# Patient Record
Sex: Female | Born: 1940 | Race: White | Hispanic: No | Marital: Single | Smoking: Never smoker
Health system: Southern US, Community
[De-identification: ages and names within clinical notes are randomized; demographics above are authoritative.]

## PROBLEM LIST (undated history)

## (undated) DIAGNOSIS — R011 Cardiac murmur, unspecified: Secondary | ICD-10-CM

## (undated) DIAGNOSIS — K589 Irritable bowel syndrome without diarrhea: Secondary | ICD-10-CM

## (undated) DIAGNOSIS — G4730 Sleep apnea, unspecified: Secondary | ICD-10-CM

## (undated) DIAGNOSIS — T884XXA Failed or difficult intubation, initial encounter: Secondary | ICD-10-CM

## (undated) DIAGNOSIS — T8859XA Other complications of anesthesia, initial encounter: Secondary | ICD-10-CM

## (undated) DIAGNOSIS — K559 Vascular disorder of intestine, unspecified: Secondary | ICD-10-CM

## (undated) DIAGNOSIS — R519 Headache, unspecified: Secondary | ICD-10-CM

## (undated) DIAGNOSIS — K648 Other hemorrhoids: Secondary | ICD-10-CM

## (undated) DIAGNOSIS — J45909 Unspecified asthma, uncomplicated: Secondary | ICD-10-CM

## (undated) DIAGNOSIS — T4145XA Adverse effect of unspecified anesthetic, initial encounter: Secondary | ICD-10-CM

## (undated) DIAGNOSIS — K55039 Acute (reversible) ischemia of large intestine, extent unspecified: Secondary | ICD-10-CM

## (undated) DIAGNOSIS — R32 Unspecified urinary incontinence: Secondary | ICD-10-CM

## (undated) DIAGNOSIS — K219 Gastro-esophageal reflux disease without esophagitis: Secondary | ICD-10-CM

## (undated) DIAGNOSIS — D62 Acute posthemorrhagic anemia: Secondary | ICD-10-CM

## (undated) DIAGNOSIS — E781 Pure hyperglyceridemia: Secondary | ICD-10-CM

## (undated) DIAGNOSIS — K529 Noninfective gastroenteritis and colitis, unspecified: Secondary | ICD-10-CM

## (undated) DIAGNOSIS — M797 Fibromyalgia: Secondary | ICD-10-CM

## (undated) DIAGNOSIS — R51 Headache: Secondary | ICD-10-CM

## (undated) DIAGNOSIS — R159 Full incontinence of feces: Secondary | ICD-10-CM

## (undated) DIAGNOSIS — M79673 Pain in unspecified foot: Secondary | ICD-10-CM

## (undated) DIAGNOSIS — G8929 Other chronic pain: Secondary | ICD-10-CM

## (undated) DIAGNOSIS — M1990 Unspecified osteoarthritis, unspecified site: Secondary | ICD-10-CM

## (undated) DIAGNOSIS — E785 Hyperlipidemia, unspecified: Secondary | ICD-10-CM

## (undated) DIAGNOSIS — E049 Nontoxic goiter, unspecified: Secondary | ICD-10-CM

## (undated) HISTORY — PX: CATARACT EXTRACTION: SUR2

## (undated) HISTORY — PX: THYROID SURGERY: SHX805

## (undated) HISTORY — PX: TUBAL LIGATION: SHX77

## (undated) HISTORY — PX: MYOMECTOMY: SHX85

## (undated) HISTORY — PX: EYE SURGERY: SHX253

## (undated) HISTORY — PX: SACRAL NERVE STIMULATOR PLACEMENT: SHX2367

## (undated) HISTORY — DX: Acute posthemorrhagic anemia: D62

## (undated) HISTORY — PX: TONSILLECTOMY: SUR1361

## (undated) HISTORY — DX: Irritable bowel syndrome, unspecified: K58.9

## (undated) HISTORY — PX: TOOTH EXTRACTION: SUR596

## (undated) HISTORY — DX: Acute (reversible) ischemia of large intestine, extent unspecified: K55.039

---

## 1978-10-12 HISTORY — PX: OTHER SURGICAL HISTORY: SHX169

## 2005-07-13 ENCOUNTER — Emergency Department: Payer: Self-pay | Admitting: General Practice

## 2005-08-20 ENCOUNTER — Ambulatory Visit: Payer: Self-pay | Admitting: Internal Medicine

## 2005-12-30 ENCOUNTER — Ambulatory Visit: Payer: Self-pay | Admitting: Ophthalmology

## 2007-06-16 ENCOUNTER — Ambulatory Visit: Payer: Self-pay | Admitting: Family Medicine

## 2008-01-02 ENCOUNTER — Ambulatory Visit: Payer: Self-pay | Admitting: General Surgery

## 2008-07-24 ENCOUNTER — Emergency Department: Payer: Self-pay | Admitting: Unknown Physician Specialty

## 2010-03-31 ENCOUNTER — Ambulatory Visit: Payer: Self-pay | Admitting: Unknown Physician Specialty

## 2010-05-02 ENCOUNTER — Ambulatory Visit: Payer: Self-pay

## 2010-05-08 ENCOUNTER — Ambulatory Visit: Payer: Self-pay

## 2010-10-21 ENCOUNTER — Ambulatory Visit: Payer: Self-pay

## 2011-06-23 ENCOUNTER — Ambulatory Visit: Payer: Self-pay

## 2011-10-02 ENCOUNTER — Ambulatory Visit: Payer: Self-pay | Admitting: Family Medicine

## 2011-10-19 ENCOUNTER — Encounter: Payer: Self-pay | Admitting: Urology

## 2011-11-13 ENCOUNTER — Encounter: Payer: Self-pay | Admitting: Urology

## 2012-02-10 DIAGNOSIS — K559 Vascular disorder of intestine, unspecified: Secondary | ICD-10-CM

## 2012-02-10 DIAGNOSIS — E781 Pure hyperglyceridemia: Secondary | ICD-10-CM

## 2012-02-10 HISTORY — DX: Pure hyperglyceridemia: E78.1

## 2012-02-10 HISTORY — DX: Vascular disorder of intestine, unspecified: K55.9

## 2012-02-26 ENCOUNTER — Encounter (HOSPITAL_COMMUNITY): Payer: Self-pay | Admitting: *Deleted

## 2012-02-26 ENCOUNTER — Emergency Department (HOSPITAL_COMMUNITY): Payer: PRIVATE HEALTH INSURANCE

## 2012-02-26 ENCOUNTER — Inpatient Hospital Stay (HOSPITAL_COMMUNITY)
Admission: EM | Admit: 2012-02-26 | Discharge: 2012-02-29 | DRG: 395 | Disposition: A | Discharge status: Home / Self Care | Payer: PRIVATE HEALTH INSURANCE | Attending: Internal Medicine | Admitting: Internal Medicine

## 2012-02-26 DIAGNOSIS — E079 Disorder of thyroid, unspecified: Secondary | ICD-10-CM | POA: Diagnosis present

## 2012-02-26 DIAGNOSIS — K55059 Acute (reversible) ischemia of intestine, part and extent unspecified: Principal | ICD-10-CM

## 2012-02-26 DIAGNOSIS — K589 Irritable bowel syndrome without diarrhea: Secondary | ICD-10-CM | POA: Diagnosis present

## 2012-02-26 DIAGNOSIS — K2940 Chronic atrophic gastritis without bleeding: Secondary | ICD-10-CM | POA: Diagnosis present

## 2012-02-26 DIAGNOSIS — M19079 Primary osteoarthritis, unspecified ankle and foot: Secondary | ICD-10-CM | POA: Diagnosis present

## 2012-02-26 DIAGNOSIS — E039 Hypothyroidism, unspecified: Secondary | ICD-10-CM | POA: Diagnosis present

## 2012-02-26 DIAGNOSIS — M19049 Primary osteoarthritis, unspecified hand: Secondary | ICD-10-CM | POA: Diagnosis present

## 2012-02-26 DIAGNOSIS — E785 Hyperlipidemia, unspecified: Secondary | ICD-10-CM | POA: Diagnosis present

## 2012-02-26 DIAGNOSIS — K5792 Diverticulitis of intestine, part unspecified, without perforation or abscess without bleeding: Secondary | ICD-10-CM

## 2012-02-26 DIAGNOSIS — R1084 Generalized abdominal pain: Secondary | ICD-10-CM | POA: Diagnosis present

## 2012-02-26 DIAGNOSIS — K922 Gastrointestinal hemorrhage, unspecified: Secondary | ICD-10-CM

## 2012-02-26 DIAGNOSIS — K5732 Diverticulitis of large intestine without perforation or abscess without bleeding: Secondary | ICD-10-CM

## 2012-02-26 DIAGNOSIS — K55039 Acute (reversible) ischemia of large intestine, extent unspecified: Secondary | ICD-10-CM | POA: Diagnosis present

## 2012-02-26 DIAGNOSIS — R109 Unspecified abdominal pain: Secondary | ICD-10-CM

## 2012-02-26 HISTORY — DX: Other hemorrhoids: K64.8

## 2012-02-26 HISTORY — DX: Unspecified urinary incontinence: R32

## 2012-02-26 HISTORY — DX: Full incontinence of feces: R15.9

## 2012-02-26 HISTORY — DX: Other chronic pain: M79.673

## 2012-02-26 HISTORY — DX: Nontoxic goiter, unspecified: E04.9

## 2012-02-26 HISTORY — DX: Unspecified osteoarthritis, unspecified site: M19.90

## 2012-02-26 HISTORY — DX: Other chronic pain: G89.29

## 2012-02-26 HISTORY — DX: Gastro-esophageal reflux disease without esophagitis: K21.9

## 2012-02-26 HISTORY — DX: Pure hyperglyceridemia: E78.1

## 2012-02-26 HISTORY — DX: Vascular disorder of intestine, unspecified: K55.9

## 2012-02-26 HISTORY — DX: Hyperlipidemia, unspecified: E78.5

## 2012-02-26 HISTORY — DX: Acute (reversible) ischemia of large intestine, extent unspecified: K55.039

## 2012-02-26 HISTORY — DX: Noninfective gastroenteritis and colitis, unspecified: K52.9

## 2012-02-26 LAB — DIFFERENTIAL
Basophils Absolute: 0 10*3/uL (ref 0.0–0.1)
Basophils Relative: 0 % (ref 0–1)
Eosinophils Absolute: 0 10*3/uL (ref 0.0–0.7)
Eosinophils Relative: 0 % (ref 0–5)
Monocytes Absolute: 0.6 10*3/uL (ref 0.1–1.0)

## 2012-02-26 LAB — URINALYSIS, MICROSCOPIC ONLY
Bilirubin Urine: NEGATIVE
Ketones, ur: NEGATIVE mg/dL
Protein, ur: NEGATIVE mg/dL
Urobilinogen, UA: 0.2 mg/dL (ref 0.0–1.0)

## 2012-02-26 LAB — CBC
HCT: 37.5 % (ref 36.0–46.0)
MCH: 30.8 pg (ref 26.0–34.0)
MCHC: 34.1 g/dL (ref 30.0–36.0)
MCV: 90.4 fL (ref 78.0–100.0)
MCV: 91.8 fL (ref 78.0–100.0)
Platelets: 186 10*3/uL (ref 150–400)
RDW: 13.3 % (ref 11.5–15.5)
RDW: 13.5 % (ref 11.5–15.5)
WBC: 9.3 10*3/uL (ref 4.0–10.5)

## 2012-02-26 LAB — LACTIC ACID, PLASMA: Lactic Acid, Venous: 0.9 mmol/L (ref 0.5–2.2)

## 2012-02-26 LAB — BASIC METABOLIC PANEL
Calcium: 9.2 mg/dL (ref 8.4–10.5)
Creatinine, Ser: 0.59 mg/dL (ref 0.50–1.10)
GFR calc Af Amer: >90 mL/min (ref >90)

## 2012-02-26 LAB — T4, FREE: Free T4: 0.95 ng/dL (ref 0.80–1.80)

## 2012-02-26 LAB — OCCULT BLOOD, POC DEVICE: Fecal Occult Bld: POSITIVE

## 2012-02-26 MED ORDER — METRONIDAZOLE IN NACL 5-0.79 MG/ML-% IV SOLN
500.0000 mg | Freq: Once | INTRAVENOUS | Status: AC
  Administered 2012-02-26: 500 mg via INTRAVENOUS
  Filled 2012-02-26: qty 100

## 2012-02-26 MED ORDER — MORPHINE SULFATE 2 MG/ML IJ SOLN
2.0000 mg | Freq: Four times a day (QID) | INTRAMUSCULAR | Status: DC | PRN
  Administered 2012-02-26: 2 mg via INTRAVENOUS
  Filled 2012-02-26: qty 1

## 2012-02-26 MED ORDER — METOCLOPRAMIDE HCL 5 MG/ML IJ SOLN
10.0000 mg | Freq: Once | INTRAMUSCULAR | Status: AC
  Administered 2012-02-26: 10 mg via INTRAVENOUS
  Filled 2012-02-26: qty 2

## 2012-02-26 MED ORDER — ONDANSETRON HCL 4 MG/2ML IJ SOLN
4.0000 mg | Freq: Once | INTRAMUSCULAR | Status: AC
  Administered 2012-02-26: 4 mg via INTRAVENOUS
  Filled 2012-02-26: qty 2

## 2012-02-26 MED ORDER — HYDROMORPHONE HCL PF 1 MG/ML IJ SOLN
1.0000 mg | INTRAMUSCULAR | Status: DC | PRN
  Administered 2012-02-26 – 2012-02-28 (×5): 1 mg via INTRAVENOUS
  Filled 2012-02-26 (×5): qty 1

## 2012-02-26 MED ORDER — PEG 3350-KCL-NA BICARB-NACL 420 G PO SOLR
4000.0000 mL | Freq: Once | ORAL | Status: AC
  Administered 2012-02-26: 4000 mL via ORAL
  Filled 2012-02-26: qty 4000

## 2012-02-26 MED ORDER — CIPROFLOXACIN IN D5W 400 MG/200ML IV SOLN
400.0000 mg | Freq: Once | INTRAVENOUS | Status: AC
  Administered 2012-02-26: 400 mg via INTRAVENOUS
  Filled 2012-02-26: qty 200

## 2012-02-26 MED ORDER — MORPHINE SULFATE 4 MG/ML IJ SOLN
4.0000 mg | Freq: Once | INTRAMUSCULAR | Status: AC
  Administered 2012-02-26: 4 mg via INTRAVENOUS
  Filled 2012-02-26: qty 1

## 2012-02-26 MED ORDER — SODIUM CHLORIDE 0.9 % IV SOLN
INTRAVENOUS | Status: DC
  Administered 2012-02-26 (×3): via INTRAVENOUS
  Administered 2012-02-26: 125 mL via INTRAVENOUS
  Administered 2012-02-27 (×2): via INTRAVENOUS

## 2012-02-26 MED ORDER — PANTOPRAZOLE SODIUM 40 MG IV SOLR
40.0000 mg | INTRAVENOUS | Status: DC
  Administered 2012-02-26: 40 mg via INTRAVENOUS
  Filled 2012-02-26: qty 40

## 2012-02-26 MED ORDER — HYDROMORPHONE HCL PF 1 MG/ML IJ SOLN
1.0000 mg | Freq: Once | INTRAMUSCULAR | Status: AC
  Administered 2012-02-26: 1 mg via INTRAVENOUS
  Filled 2012-02-26: qty 1

## 2012-02-26 MED ORDER — SODIUM CHLORIDE 0.9 % IJ SOLN
3.0000 mL | Freq: Two times a day (BID) | INTRAMUSCULAR | Status: DC
  Administered 2012-02-27 – 2012-02-28 (×3): 3 mL via INTRAVENOUS

## 2012-02-26 MED ORDER — IOHEXOL 300 MG/ML  SOLN
100.0000 mL | Freq: Once | INTRAMUSCULAR | Status: AC | PRN
  Administered 2012-02-26: 100 mL via INTRAVENOUS

## 2012-02-26 NOTE — ED Notes (Signed)
Patient transported to CT 
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
## 2012-02-26 NOTE — ED Notes (Signed)
Pt given contrast to drink for CT Scan.

## 2012-02-26 NOTE — ED Notes (Signed)
MD at bedside. 

## 2012-02-26 NOTE — ED Provider Notes (Signed)
History     CSN: [id number]  Arrival date & time 02/26/12  1023   First MD Initiated Contact with Patient 02/26/12 1031      Chief Complaint  Patient presents with  . Abdominal Pain  . Rectal Bleeding    (Consider location/radiation/quality/duration/timing/severity/associated sxs/prior treatment) Patient is a 71 y.o. female presenting with abdominal pain and hematochezia. The history is provided by the patient.  Abdominal Pain The primary symptoms of the illness include abdominal pain, nausea, diarrhea and hematochezia. The primary symptoms of the illness do not include fever, shortness of breath or vomiting.  Additional symptoms associated with the illness include chills and diaphoresis.  Rectal Bleeding  Associated symptoms include abdominal pain, diarrhea and nausea. Pertinent negatives include no fever, no vomiting and no headaches.   the patient is a 71 year old, female, who presents emergency department complaining of left-sided abdominal pain, with bloody stool.  She also felt lightheaded, but denies shortness of breath.  Her symptoms began last night.  She has not had this before.  She had nausea, with no vomiting.  She has not had fevers.  She did have diaphoresis and chills.  She has not been on antibiotics or exposure to anybody with similar symptoms.  She had a tubal ligation, but no other abdominal surgeries.  Past Medical History  Diagnosis Date  . Arthritis   . Chronic foot pain     History reviewed. No pertinent past surgical history.  No family history on file.  History  Substance Use Topics  . Smoking status: Never Smoker   . Smokeless tobacco: Not on file  . Alcohol Use: No    OB History    Grav Para Term Preterm Abortions TAB SAB Ect Mult Living                  Review of Systems  Constitutional: Positive for chills and diaphoresis. Negative for fever.  Respiratory: Negative for shortness of breath.   Gastrointestinal: Positive for nausea,  abdominal pain, diarrhea, blood in stool and hematochezia. Negative for vomiting.  Neurological: Positive for light-headedness. Negative for headaches.  Psychiatric/Behavioral: Negative for confusion.  All other systems reviewed and are negative.    Allergies  Aspirin and Bee venom  Home Medications   Current Outpatient Rx  Name Route Sig Dispense Refill  . CETIRIZINE HCL 10 MG PO TABS Oral Take 10 mg by mouth daily.    . CYCLOBENZAPRINE HCL 10 MG PO TABS Oral Take 10 mg by mouth at bedtime.    . MELOXICAM 7.5 MG PO TABS Oral Take 7.5 mg by mouth daily.    Marland Kitchen NAPROXEN SODIUM 220 MG PO TABS Oral Take 440 mg by mouth 2 (two) times daily with a meal. For pain      BP 151/104  Pulse 71  Temp(Src) 97.8 F (36.6 C) (Oral)  Resp 16  SpO2 97%  Physical Exam  Vitals reviewed. Constitutional: She is oriented to person, place, and time. She appears well-developed and well-nourished.       Uncomfortable appearing  HENT:  Head: Normocephalic and atraumatic.  Eyes: Conjunctivae and EOM are normal.  Neck: Normal range of motion. Neck supple.  Cardiovascular: Normal rate.   No murmur heard. Pulmonary/Chest: Effort normal and breath sounds normal. No respiratory distress.  Abdominal: Soft. Bowel sounds are normal. She exhibits no distension. There is tenderness. There is guarding. There is no rebound.  Genitourinary: Guaiac positive stool.       Rectal exam performed with CNA  chaperone  Musculoskeletal: Normal range of motion. She exhibits no edema.  Neurological: She is alert and oriented to person, place, and time.  Skin: Skin is warm and dry. No pallor.  Psychiatric: She has a normal mood and affect. Thought content normal.    ED Course  Procedures (including critical care time)   Labs Reviewed  CBC  DIFFERENTIAL  BASIC METABOLIC PANEL   No results found.   No diagnosis found.  2:04 PM Spoke with teaching service. They will admit  MDM  abd pain with  BRBPR diverticulitis        Cheri Guppy, MD 02/26/12 [id number]

## 2012-02-26 NOTE — ED Notes (Signed)
Pt reports onset of rectal bleeding since last night- around 1130. Reports it is bright red, passing clots. No hx of same. BP 160/90 HR 80. Right sided abdominal pain. Reports nausea/no vomiting.

## 2012-02-26 NOTE — ED Notes (Signed)
[id number] Ready 
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
## 2012-02-27 ENCOUNTER — Encounter (HOSPITAL_COMMUNITY)
Admission: EM | Disposition: A | Discharge status: Home / Self Care | Payer: Self-pay | Source: Home / Self Care | Attending: Emergency Medicine

## 2012-02-27 ENCOUNTER — Encounter (HOSPITAL_COMMUNITY): Payer: Self-pay | Admitting: *Deleted

## 2012-02-27 DIAGNOSIS — E785 Hyperlipidemia, unspecified: Secondary | ICD-10-CM

## 2012-02-27 DIAGNOSIS — K589 Irritable bowel syndrome without diarrhea: Secondary | ICD-10-CM | POA: Diagnosis present

## 2012-02-27 HISTORY — PX: COLONOSCOPY: SHX5424

## 2012-02-27 LAB — CBC
HCT: 35.4 % — ABNORMAL LOW (ref 36.0–46.0)
HCT: 35.4 % — ABNORMAL LOW (ref 36.0–46.0)
Hemoglobin: 11.8 g/dL — ABNORMAL LOW (ref 12.0–15.0)
MCH: 30.6 pg (ref 26.0–34.0)
MCHC: 33.3 g/dL (ref 30.0–36.0)
MCV: 92.9 fL (ref 78.0–100.0)
RBC: 3.81 MIL/uL — ABNORMAL LOW (ref 3.87–5.11)
RDW: 13.7 % (ref 11.5–15.5)
WBC: 12.3 10*3/uL — ABNORMAL HIGH (ref 4.0–10.5)

## 2012-02-27 LAB — LIPID PANEL
HDL: 45 mg/dL (ref >39)
LDL Cholesterol: 68 mg/dL (ref 0–99)
Total CHOL/HDL Ratio: 3.4 RATIO

## 2012-02-27 LAB — COMPREHENSIVE METABOLIC PANEL
Alkaline Phosphatase: 50 U/L (ref 39–117)
BUN: 9 mg/dL (ref 6–23)
Chloride: 107 mEq/L (ref 96–112)
GFR calc Af Amer: >90 mL/min (ref >90)
Glucose, Bld: 106 mg/dL — ABNORMAL HIGH (ref 70–99)
Potassium: 4.9 mEq/L (ref 3.5–5.1)
Total Bilirubin: 0.4 mg/dL (ref 0.3–1.2)

## 2012-02-27 SURGERY — COLONOSCOPY
Anesthesia: Moderate Sedation

## 2012-02-27 MED ORDER — FENTANYL CITRATE 0.05 MG/ML IJ SOLN
INTRAMUSCULAR | Status: AC
  Filled 2012-02-27: qty 4

## 2012-02-27 MED ORDER — DICYCLOMINE HCL 20 MG PO TABS
20.0000 mg | ORAL_TABLET | Freq: Three times a day (TID) | ORAL | Status: DC
  Administered 2012-02-27 – 2012-02-29 (×6): 20 mg via ORAL
  Filled 2012-02-27 (×8): qty 1

## 2012-02-27 MED ORDER — MIDAZOLAM HCL 10 MG/2ML IJ SOLN
INTRAMUSCULAR | Status: DC | PRN
  Administered 2012-02-27 (×3): 2 mg via INTRAVENOUS

## 2012-02-27 MED ORDER — SODIUM CHLORIDE 0.9 % IV SOLN
INTRAVENOUS | Status: AC
  Administered 2012-02-27 (×2): via INTRAVENOUS

## 2012-02-27 MED ORDER — FENTANYL NICU IV SYRINGE 50 MCG/ML
INJECTION | INTRAMUSCULAR | Status: DC | PRN
  Administered 2012-02-27 (×2): 25 ug via INTRAVENOUS

## 2012-02-27 MED ORDER — MIDAZOLAM HCL 10 MG/2ML IJ SOLN
INTRAMUSCULAR | Status: AC
  Filled 2012-02-27: qty 4

## 2012-02-27 MED ORDER — BIOTENE DRY MOUTH MT LIQD
15.0000 mL | Freq: Two times a day (BID) | OROMUCOSAL | Status: DC
  Administered 2012-02-27 – 2012-02-29 (×5): 15 mL via OROMUCOSAL

## 2012-02-27 MED ORDER — HYDROCODONE-ACETAMINOPHEN 5-325 MG PO TABS
1.0000 | ORAL_TABLET | ORAL | Status: DC | PRN
  Administered 2012-02-27 – 2012-02-28 (×3): 1 via ORAL
  Filled 2012-02-27 (×3): qty 1

## 2012-02-27 MED ORDER — DIPHENHYDRAMINE HCL 50 MG/ML IJ SOLN
INTRAMUSCULAR | Status: AC
  Filled 2012-02-27: qty 1

## 2012-02-27 NOTE — H&P (Addendum)
On-Call Internal Medicine Attending Admission Note Date: 02/27/2012  Patient name: Sherri Murray Medical record number: [id number] Date of birth: May 15, 1941 Age: 71 y.o. Gender: female  I saw and evaluated the patient. I reviewed the resident's note and I agree with the resident's findings and plan as documented in the resident's note, with additional comments as noted below.  Chief Complaint(s): Blood/maroon clots per rectum  History - key components related to admission: Patient is a 71 year old woman with a history of hypothyroidism, GERD, arthritis, and chronic foot pain, admitted with complaint of red blood with maroon clots per rectum.  Patient has left abdominal discomfort, and reports chronic left abdominal pain.  She has no prior history of GI bleeding.   Physical Exam - key components related to admission:  Filed Vitals:   02/27/12 1125 02/27/12 1135 02/27/12 1144 02/27/12 1158  BP: 111/54 106/61 110/65 108/49  Pulse:    53  Temp:    97 F (36.1 C)  TempSrc:    Oral  Resp: 20 18 13 15   Height:      Weight:      SpO2: 99% 100% 100% 95%    General: Alert, no distress Lungs: Clear Heart: Regular; S1-S2, no S3, no S4, no murmurs Abdomen: Bowel sounds present, soft; mild left abdominal tenderness; no rebound Extremities: No edema Pulses: Dorsalis pedis pulses normal bilaterally  Lab results:   Basic Metabolic Panel:  Basename 02/27/12 0500 02/26/12 1100  NA 139 141  K 4.9 3.6  CL 107 106  CO2 24 24  GLUCOSE 106* 112*  BUN 9 15  CREATININE 0.59 0.59  CALCIUM 8.5 9.2  MG -- --  PHOS -- --   Liver Function Tests:  Basename 02/27/12 0500  AST 37  ALT 11  ALKPHOS 50  BILITOT 0.4  PROT 6.4  ALBUMIN 3.3*     Basename 02/27/12 0825 02/27/12 0115 02/26/12 1100  WBC 12.3* 11.1* --  NEUTROABS -- -- 7.6  HGB 11.8* 11.8* --  HCT 35.4* 35.4* --  MCV 92.9 91.9 --  PLT 183 191 --    Cardiac Enzymes:  Basename 02/26/12 1738  CKTOTAL 63  CKMB 2.5    CKMBINDEX --  TROPONINI <0.30    Fasting Lipid Panel:  Basename 02/27/12 0500  CHOL 153  HDL 45  LDLCALC 68  TRIG 201*  CHOLHDL 3.4  LDLDIRECT --    Thyroid Function Tests:  Basename 02/26/12 1725  TSH 3.340  T4TOTAL --  FREET4 0.95  T3FREE --  THYROIDAB --    Urinalysis    Component Value Date/Time   COLORURINE YELLOW 02/26/2012 2306   APPEARANCEUR CLEAR 02/26/2012 2306   LABSPEC 1.034* 02/26/2012 2306   PHURINE 6.0 02/26/2012 2306   GLUCOSEU NEGATIVE 02/26/2012 2306   HGBUR SMALL* 02/26/2012 2306   BILIRUBINUR NEGATIVE 02/26/2012 2306   KETONESUR NEGATIVE 02/26/2012 2306   PROTEINUR NEGATIVE 02/26/2012 2306   UROBILINOGEN 0.2 02/26/2012 2306   NITRITE NEGATIVE 02/26/2012 2306   LEUKOCYTESUR SMALL* 02/26/2012 2306      Imaging results:  Ct Abdomen Pelvis W Contrast  02/26/2012  *RADIOLOGY REPORT*  Clinical Data: Rectal bleeding  CT ABDOMEN AND PELVIS WITH CONTRAST  Technique:  Multidetector CT imaging of the abdomen and pelvis was performed following the standard protocol during bolus administration of intravenous contrast.  Contrast: OMNIPAQUE IOHEXOL 300 MG/ML  SOLN  Comparison: None.  Findings: There is abnormal wall thickening and wall edema involving the distal transverse colon, splenic flexure, and proximal descending colon.  Subtle inflammatory changes in the adjacent fat are present.  No pneumatosis or extraluminal bowel gas.  No abscess formation. The SMA and celiac are widely patent vessels.  IMA is patent and diminutive.  Minimal mitral valve calcifications.  Gallbladder, liver, spleen, pancreas, adrenal glands, kidneys are within normal limits.  No abnormal adenopathy.  Trace free fluid in the right side of pelvis.  Uterus, bladder, and adnexa are within normal limits.  Dextroscoliosis of the lumbar spine.  No vertebral compression deformity.  Retroaortic left renal vein anatomy.  IMPRESSION: Abnormal wall thickening inflammatory change at the splenic flexure.   Considerations include infectious colitis, diverticulitis, inflammatory bowel disease.  Less slight considerations would include ischemia and neoplasm.  Original Report Authenticated By: Donavan Burnet, M.D.     Assessment & Plan by Problem:   1. Colitis.  Patient presented with hematochezia; CT scan of the abdomen showed abnormal wall thickening and inflammatory change at the splenic flexure, and colonoscopy showed colitis in the left colon consistent with ischemic colitis.  Colon biopsies are pending.  Dr. Leone Payor has recommended oral narcotics for pain control and a low-residue diet.  Would discuss with him whether antibiotics are indicated.  Regarding risk factors, patient has no history of arrhythmia, heart disease, or vascular disease.  2. History of thyroid disease.  TSH and free T4 are normal.  3. Hyperlipidemia.  Fasting lipid panel is pending.  4.  Dr. Josem Kaufmann will return as attending physician on Monday 02/29/2012.

## 2012-02-27 NOTE — Progress Notes (Addendum)
Subjective:    Currently, the patient has no acute complaints. States that had mild nausea this morning, as she was burping up some of the go-lytely prep. Continues to have the stable LLQ abd pain. No fevers, chills, vomiting, diarrhea. No new blood clots noted.  Interval Events: - Pt NPO with plans for colonoscopy today. - Pt is incontinent, therefore, output recording will not be accurate.   Objective:    Vital Signs:   Temp:  [97.5 F (36.4 C)-98.9 F (37.2 C)] 98.6 F (37 C) (05/18 1010) Pulse Rate:  [56-61] 56  (05/18 0927) Resp:  [16-19] 16  (05/18 1010) BP: (89-136)/(43-102) 105/56 mmHg (05/18 1010) SpO2:  [97 %-100 %] 97 % (05/18 1010) Weight:  [170 lb 13.7 oz (77.5 kg)-171 lb 4.8 oz (77.7 kg)] 171 lb 4.8 oz (77.7 kg) (05/17 2129) Last BM Date: 02/26/12  24-hour weight change: Weight change:   Intake/Output:   Intake/Output Summary (Last 24 hours) at 02/27/12 1029 Last data filed at 02/27/12 0928  Gross per 24 hour  Intake   1500 ml  Output      0 ml  Net   1500 ml      Physical Exam: General: Vital signs reviewed and noted. Well-developed, well-nourished, in no acute distress; alert, appropriate and cooperative throughout examination.  Lungs:  Normal respiratory effort. Clear to auscultation BL without crackles or wheezes.  Heart: RRR. S1 and S2 normal without gallop, or rubs. (+) murmur.  Abdomen:  BS normoactive. Soft, Nondistended, LLQ tenderness to palpation, no rebound tenderness, no guarding. No masses or organomegaly.  Extremities: No pretibial edema.     Labs:  Basic Metabolic Panel:  Lab 02/27/12 1610 02/26/12 1100  NA 139 141  K 4.9 3.6  CL 107 106  CO2 24 24  GLUCOSE 106* 112*  BUN 9 15  CREATININE 0.59 0.59  CALCIUM 8.5 9.2  MG -- --  PHOS -- --    Liver Function Tests:  Lab 02/27/12 0500  AST 37  ALT 11  ALKPHOS 50  BILITOT 0.4  PROT 6.4  ALBUMIN 3.3*    CBC:  Lab 02/27/12 0825 02/27/12 0115 02/26/12 1725 02/26/12 1100   WBC 12.3* 11.1* 9.3 10.1  NEUTROABS -- -- -- 7.6  HGB 11.8* 11.8* 12.0 12.8  HCT 35.4* 35.4* 35.7* 37.5  MCV 92.9 91.9 91.8 90.4  PLT 183 191 186 212    Cardiac Enzymes:  Lab 02/26/12 1738  CKTOTAL 63  CKMB 2.5  CKMBINDEX --  TROPONINI <0.30    Imaging:  Ct Abdomen Pelvis W Contrast (02/26/2012) - Abnormal wall thickening inflammatory change at the splenic flexure.  Considerations include infectious colitis, diverticulitis, inflammatory bowel disease.  Less slight considerations would include ischemia and neoplasm.  Original Report Authenticated By: Donavan Burnet, M.D.    Historical Data:  Last colonoscopy (03/31/2010) - the sigmoid colon was somewhat difficult (due to fixed tortuosity of sigmoid colon). Internal hemorrhoids were found, and they were small. The terminal ileum appeared normal. Repeat colonoscopy in 5 years for screening purposes. EGD (12/14/2003) - a small hiatal hernia was found. Nonbleeding, erythematous gastropathy. Normal examined duodenum. Biopsies showing antral mucosa with nonspecific chronic gastritis and foveolar hyperplasia (reactive gastropathy).     Medications:    Infusions:    . sodium chloride 125 mL/hr at 02/27/12 0432    Scheduled Medications:    . antiseptic oral rinse  15 mL Mouth Rinse BID  . ciprofloxacin  400 mg Intravenous Once  .  HYDROmorphone (DILAUDID) injection  1 mg Intravenous Once  . metoCLOPramide (REGLAN) injection  10 mg Intravenous Once  . metronidazole  500 mg Intravenous Once  .  morphine injection  4 mg Intravenous Once  . ondansetron  4 mg Intravenous Once  . polyethylene glycol-electrolytes  4,000 mL Oral Once  . sodium chloride  3 mL Intravenous Q12H  . DISCONTD: pantoprazole (PROTONIX) IV  40 mg Intravenous Q24H    PRN Medications: HYDROmorphone (DILAUDID) injection, iohexol, DISCONTD:  morphine injection   Assessment/ Plan:    Pt is a 71 y.o. yo female with a PMHx of GERD, thyroid disease, chronic  pain, incontinence of urine who was admitted on 02/26/2012 with symptoms of BRBPR. Cause of bleed is unclear at this time, and GI service is consulting to provide further input. Records have been requested from her GI physician at [facility], Dr. Markham Jordan.   1) GI bleed - the cause of this patient's GI bleed is unclear at this time. Multiple differential diagnoses are considered for the bleeding, including upper GI bleed possibly secondary to going NSAID use including both meloxicam and chronic Aleve, especially given documented chronic gastritis as of 2005 EGD. Ischemic colitis is also considered, LDH and lactate being within normal limits (however, may not have elevation in acute setting). CT abdomen is concerning for wall thickening at the splenic flexure, and although diverticulitis is considered as a differential, the lack of fever, acuity of pain, and bloody stools are less consistent with this diagnosis. She has already received cipro and flagyl x 1 in the ER. Celiac disease was ruled out by her primary gastroenterologist as a cause for her chronic diarrhea.   Plan:  - Appreciate GI assistance and input in the management of our patient.  - GI service - please see records in chart from patient's primary GI in case copies did not make it there already.  - Continue protonix - IV pain medications for now. - Pending colonoscopy today  - Will discuss with GI if EGD may also be considered since last one seems to have been in 2005, showing chronic gastritis, and she has ongoing NSAID use with both Meloxicam and Aleve. - If abovementioned GI evaluation negative, will consider MRA to evaluate for ischemic colitis. - Check stool cultures and gram stain to evaluate for infectious etiology - will also monitor for evidence of leukocytosis or fevers (at which time would consider to resume cipro/ flagyl)  2) H/O thyroid goiter - although patient self-reports thyroid disease (hypothyroidism after remote thyroid  goiter removal), TSH and free T4 are normal. Therefore, patient does not appear to have active hypothyroidism at this time.  3) Hyperlipidemia - not currently on therapy  - will check FLP   4) DVT PPX - SCDs   Length of Stay: 1 days   Johnette Abraham, Norman Herrlich, Internal Medicine Resident 02/27/2012, 10:29 AM

## 2012-02-27 NOTE — Op Note (Signed)
[facility] [address] [place], [place]  [address]  COLONOSCOPY PROCEDURE REPORT  PATIENT:  Sherri Murray, Sherri Murray  MR#:  [id number] BIRTHDATE:  Nov 21, 1940, 70 yrs. old  GENDER:  female ENDOSCOPIST:  Iva Boop, MD, [facility] REF. BY:  [facility] PROCEDURE DATE:  02/27/2012 PROCEDURE:  Colonoscopy with biopsy ASA CLASS:  Class II INDICATIONS:  hematochezia, Abnormal CT of abdomen segmental colitis on CT  also has chronic diarrhea and abdominal pain with urge incontinence MEDICATIONS:   Fentanyl 75 mcg, Versed 6 mg IV  DESCRIPTION OF PROCEDURE:   After the risks benefits and alternatives of the procedure were thoroughly explained, informed consent was obtained.  Digital rectal exam was performed and revealed decreased sphincter tone and no rectal masses.   The Pentax Colonoscope [id number] endoscope was introduced through the anus and advanced to the terminal ileum which was intubated for a short distance, without limitations.  The quality of the prep was good, using Colyte.  The instrument was then slowly withdrawn as the colon was fully examined. <<PROCEDUREIMAGES>>  FINDINGS:  Colitis was found in the left colon. Splenic flexure region (65 cm) to sigmoid (30 cm). Superficial and deep irregular linear ulcerations. Multiple biopsies were obtained and sent to pathology.  This was otherwise a normal examination of the colon. Random biopsies were obtained and sent to pathology.   Retroflexed views in the rectum revealed no abnormalities.    The time to cecum = 6:00 minutes. The scope was then withdrawn in 11:00 minutes from the cecum and the procedure completed. COMPLICATIONS:  None ENDOSCOPIC IMPRESSION: 1) Colitis in the left colon - consistent with ischemic colitis  2) Otherwise normal examination into terminalileum. Random biopsies taken of normal colon to look for micro colitis - suspect IBS 3 Lax anal sphincter RECOMMENDATIONS: 1) Follow-up  biopises but suspect ischemic colitis in setting of IBS 2) Oral narcotics and anti-spasmodic ordered 3) Home 1-2 days unlesss other problems 4) Low-residue diet 5) I will arrange path result notification and follow-up with me as she has requested change in GI MD  Iva Boop, MD, [facility]  CC:  Mila Merry, MD  n. Rosalie Doctor:   Iva Boop at 02/27/2012 11:27 AM  Valeria Batman, [phone]

## 2012-02-28 ENCOUNTER — Encounter (HOSPITAL_COMMUNITY): Payer: Self-pay | Admitting: Physician Assistant

## 2012-02-28 LAB — CBC
Platelets: 168 10*3/uL (ref 150–400)
RDW: 13.5 % (ref 11.5–15.5)
WBC: 9.6 10*3/uL (ref 4.0–10.5)

## 2012-02-28 LAB — BASIC METABOLIC PANEL
CO2: 22 mEq/L (ref 19–32)
Chloride: 109 mEq/L (ref 96–112)
Potassium: 3.7 mEq/L (ref 3.5–5.1)
Sodium: 141 mEq/L (ref 135–145)

## 2012-02-28 LAB — DIFFERENTIAL
Basophils Absolute: 0 10*3/uL (ref 0.0–0.1)
Lymphocytes Relative: 31 % (ref 12–46)
Neutro Abs: 5.7 10*3/uL (ref 1.7–7.7)

## 2012-02-28 MED ORDER — PANTOPRAZOLE SODIUM 40 MG PO TBEC
40.0000 mg | DELAYED_RELEASE_TABLET | Freq: Every day | ORAL | Status: DC
  Administered 2012-02-28 – 2012-02-29 (×2): 40 mg via ORAL
  Filled 2012-02-28 (×2): qty 1

## 2012-02-28 MED ORDER — HYDROCODONE-ACETAMINOPHEN 5-325 MG PO TABS
1.0000 | ORAL_TABLET | ORAL | Status: DC | PRN
  Administered 2012-02-28 – 2012-02-29 (×2): 2 via ORAL
  Filled 2012-02-28 (×2): qty 2

## 2012-02-28 NOTE — Consult Note (Addendum)
Sherri Murray Daily Rounding Note 02/28/2012, 9:36 AM  SUBJECTIVE:       No BMs since Thursday. Left abd pain improved, but persistent No nausea, tolerating low fibre diet.  OBJECTIVE:        General: looks well     Vital signs in last 24 hours:    Temp:  [97 F (36.1 C)-98.7 F (37.1 C)] 98.6 F (37 C) (05/19 0625) Pulse Rate:  [53-76] 70  (05/19 0625) Resp:  [13-20] 18  (05/19 0625) BP: (103-137)/(48-75) 137/64 mmHg (05/19 0625) SpO2:  [94 %-100 %] 94 % (05/19 0625) Weight:  [180 lb 8 oz (81.874 kg)] 180 lb 8 oz (81.874 kg) (05/18 2129) Last BM Date: 02/25/12  Heart: RRR Chest: clear Abdomen: NT. ND.  No mass.  Active BS  Extremities: no pedal edema Neuro/Psych:  Rambling, somewhat tangential historian but not confused or disoriented.   Lab Results:  Basename 02/28/12 0546 02/27/12 0825 02/27/12 0115  WBC 9.6 12.3* 11.1*  HGB 10.6* 11.8* 11.8*  HCT 31.3* 35.4* 35.4*  PLT 168 183 191   BMET  Basename 02/28/12 0546 02/27/12 0500 02/26/12 1100  NA 141 139 141  K 3.7 4.9 3.6  CL 109 107 106  CO2 22 24 24   GLUCOSE 99 106* 112*  BUN 10 9 15   CREATININE 0.64 0.59 0.59  CALCIUM 8.4 8.5 9.2   LFT  Basename 02/27/12 0500  PROT 6.4  ALBUMIN 3.3*  AST 37  ALT 11  ALKPHOS 50  BILITOT 0.4  BILIDIR --  IBILI --   ASSESMENT: *  Ischemic colitis.  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   Does have lax anal sphincter tone which (along with urgent defecation in IBS) accounts for occasional fecal seepage and incontinence.  Note that on Murray OV of 02/26/2012 with [facility] clinic, she was to have colonoscopy arranged and referral to Dr Dorita Fray at [facility] was entertained.  *  Large doses of multiple NSAIDs Mobic,  OTC and presciption anaprox.   PLAN: *  Start PPI, as she does have the hx of using NSAIDs.  Was on daily Prilosec according to the 5/17 Murray office note.  ( She is a poor historian, can not tell you all the meds she  takes) *  Needs instruction as to use of NSAIDs so she stops duplicating meds in this classification.   LOS: 2 days   Jennye Moccasin  02/28/2012, 9:36 AM Pager: [phone]   [place] Murray Attending  I have also seen and assessed the patient and agree with the above note. She is improved enough to go home, I think. She seems focused on pain but I explained she will still have some for weeks, as she has chronically with IBS also. Could need 1 more day but I have suggested dc today.I reviewed ischemic colitis and IBS with her and family. She needs low-residue diet. I will follow-up biopsy results and make follow-up appointment in late May or early June.  I think there are IBS medications like Lotronex that could help her tremendously and also may be a candidate for procedure to improve continence of anal sphincter (would have her see a colorectal surgeon).  Please call if ?'s - signing off  Iva Boop, MD, [facility] [phone] (pager) 02/28/2012 1:52 PM   Old records reviewed with medical records from [facility]  Long history of IBS. Multiple endoscopic procedures, EGD 12/14/2003 with hiatal hernia  and non-H. pylori gastritis. Incomplete colonoscopy 2005 subsequently complete with propofol sedation, with internal hemorrhoids only. They were considering referral to [facility]. Considering her from orthopedics to try to determine relation of back injury and urinary and fecal incontinence. Celiac antibodies every 05/31/2012 with a normal IgA, negative deamidated gliadin antibodies IgA negative TTG antibody IgA negative and a mesial antibody IgA.  2  2011 colonoscopy showing hemorrhoids and normal terminal ileum  November 2006 normal colonoscopy random biopsies taken, suspect they were normal but incomplete data  Iva Boop, MD, [facility]

## 2012-02-28 NOTE — Progress Notes (Signed)
Pt has had pain medication again this AM and reports she is to sleepy for education. Low fiber low residue diet information left at bedside. Will have next staff RN follow up to perform teaching.

## 2012-02-28 NOTE — Progress Notes (Signed)
Subjective:    Currently, the patient states she is having a right unilateral headache, worsened with the direct sunlight in her eyes. Continues to have LLQ abdominal pain, intermittently worse, but no more bloody bowel movements. No fevers, chills, nausea, vomiting, diarrhea, dysuria, hematuria.    Interval Events: - Colonoscopy was performed on 05/19 - showing colitis in the left colon (consistent with ischemic colitis), otherwise normal exam to terminal ileum. Random biopsies were taken and will be followed up as an outpatient with Dr. Leone Payor. Lax anal sphincter was noted.   Objective:    Vital Signs:   Temp:  [97.8 F (36.6 C)-98.7 F (37.1 C)] 98.1 F (36.7 C) (05/19 1331) Pulse Rate:  [59-106] 106  (05/19 1331) Resp:  [16-18] 18  (05/19 1331) BP: (108-151)/(51-87) 134/87 mmHg (05/19 1331) SpO2:  [93 %-99 %] 95 % (05/19 1331) Weight:  [180 lb 8 oz (81.874 kg)] 180 lb 8 oz (81.874 kg) (05/18 2129) Last BM Date: 02/25/12  24-hour weight change: Weight change: 9 lb 10.3 oz (4.374 kg)  Intake/Output:   Intake/Output Summary (Last 24 hours) at 02/28/12 1446 Last data filed at 02/28/12 1333  Gross per 24 hour  Intake    480 ml  Output   2150 ml  Net  -1670 ml      Physical Exam: General: Vital signs reviewed and noted. Well-developed, well-nourished, in no acute distress; alert, appropriate and cooperative throughout examination.  Lungs:  Normal respiratory effort. Clear to auscultation BL without crackles or wheezes.  Heart: RRR. S1 and S2 normal without gallop, or rubs. (+) murmur.  Abdomen:  BS normoactive. Soft, Nondistended, LLQ tenderness to palpation, no rebound tenderness, no guarding. No masses or organomegaly.  Extremities: No pretibial edema.     Labs:  Basic Metabolic Panel:  Lab 02/28/12 4098 02/27/12 0500 02/26/12 1100  NA 141 139 141  K 3.7 4.9 3.6  CL 109 107 106  CO2 22 24 24   GLUCOSE 99 106* 112*  BUN 10 9 15   CREATININE 0.64 0.59 0.59    CALCIUM 8.4 8.5 9.2  MG -- -- --  PHOS -- -- --    Liver Function Tests:  Lab 02/27/12 0500  AST 37  ALT 11  ALKPHOS 50  BILITOT 0.4  PROT 6.4  ALBUMIN 3.3*    CBC:  Lab 02/28/12 0546 02/27/12 0825 02/27/12 0115 02/26/12 1725 02/26/12 1100  WBC 9.6 12.3* 11.1* 9.3 10.1  NEUTROABS 5.7 -- -- -- 7.6  HGB 10.6* 11.8* 11.8* 12.0 12.8  HCT 31.3* 35.4* 35.4* 35.7* 37.5  MCV 91.5 92.9 91.9 91.8 90.4  PLT 168 183 191 186 212    Cardiac Enzymes:  Lab 02/26/12 1738  CKTOTAL 63  CKMB 2.5  CKMBINDEX --  TROPONINI <0.30    Fasting Lipid Panel:  Ref. Range 02/27/2012 05:00  Cholesterol Latest Range: 0-200 mg/dL 119  Triglycerides Latest Range: <150 mg/dL 147 (H)  HDL Latest Range: >39 mg/dL 45  LDL (calc) Latest Range: 0-99 mg/dL 68  VLDL Latest Range: 0-40 mg/dL 40  Total CHOL/HDL Ratio No range found 3.4    Imaging:  Ct Abdomen Pelvis W Contrast (02/26/2012) - Abnormal wall thickening inflammatory change at the splenic flexure.  Considerations include infectious colitis, diverticulitis, inflammatory bowel disease.  Less slight considerations would include ischemia and neoplasm.  Original Report Authenticated By: Donavan Burnet, M.D.    Historical Data:  Last colonoscopy (03/31/2010) - the sigmoid colon was somewhat difficult (due to fixed tortuosity of  sigmoid colon). Internal hemorrhoids were found, and they were small. The terminal ileum appeared normal. Repeat colonoscopy in 5 years for screening purposes. EGD (12/14/2003) - a small hiatal hernia was found. Nonbleeding, erythematous gastropathy. Normal examined duodenum. Biopsies showing antral mucosa with nonspecific chronic gastritis and foveolar hyperplasia (reactive gastropathy).     Medications:    Infusions:    . sodium chloride 125 mL/hr at 02/27/12 2123  . DISCONTD: sodium chloride 125 mL/hr at 02/27/12 1340    Scheduled Medications:    . antiseptic oral rinse  15 mL Mouth Rinse BID  . dicyclomine   20 mg Oral TID AC  . pantoprazole  40 mg Oral Q0600  . sodium chloride  3 mL Intravenous Q12H    PRN Medications: HYDROcodone-acetaminophen   Assessment/ Plan:    Pt is a 71 y.o. yo female with a PMHx of GERD, thyroid disease, chronic pain, incontinence of urine who was admitted on 02/26/2012 with symptoms of BRBPR. Cause of bleed is unclear at this time, and GI service is consulting to provide further input. Records have been requested from her GI physician at [facility], Dr. Markham Jordan.   1) GI bleed - the cause of this patient's GI bleed is thought likely secondary to ischemic colitis, which would be consistent with colitis noted on colonoscopy performed (05/19) Biopsies have been taken and will be followed up as an outpatient with Dr. Leone Payor. Celiac disease was ruled out by her primary gastroenterologist as a cause for her chronic diarrhea, but ruled out..   Plan:  - Appreciate GI assistance and input in the management of our patient.  - Continue protonix - Change to oral pain medications.  2) IBS with chronic LLQ abdominal pain -  - COntinue low residue diet - Appreciate GI assistance and input in the management of our patient. - Dr. Leone Payor will consider IBS medications in future if needed, such as Lotronex.  3) Hyperlipidemia - mild elevation of triglycerides. Otherwise, well controlled - Will recommend diet modification.  4) H/O thyroid goiter - although patient self-reports thyroid disease (hypothyroidism after remote thyroid goiter removal), TSH and free T4 are normal. Therefore, patient does not appear to have active hypothyroidism at this time.  5) DVT PPX - SCDs  6) Dispo - dc home today or tomorrow early AM.   Length of Stay: 2 days   Johnette Abraham, Norman Herrlich, Internal Medicine Resident 02/28/2012, 2:46 PM

## 2012-02-29 ENCOUNTER — Encounter: Payer: Self-pay | Admitting: Internal Medicine

## 2012-02-29 ENCOUNTER — Encounter (HOSPITAL_COMMUNITY): Payer: Self-pay | Admitting: Internal Medicine

## 2012-02-29 DIAGNOSIS — K2940 Chronic atrophic gastritis without bleeding: Secondary | ICD-10-CM

## 2012-02-29 MED ORDER — PANTOPRAZOLE SODIUM 40 MG PO TBEC: 40.0000 mg | DELAYED_RELEASE_TABLET | Freq: Every day | ORAL | Status: DC

## 2012-02-29 MED ORDER — ASPIRIN 81 MG PO TABS: 81.0000 mg | ORAL_TABLET | Freq: Every day | ORAL | Status: AC

## 2012-02-29 MED ORDER — DICYCLOMINE HCL 20 MG PO TABS: 20.0000 mg | ORAL_TABLET | Freq: Three times a day (TID) | ORAL | Status: DC

## 2012-02-29 MED ORDER — HYDROCODONE-ACETAMINOPHEN 5-325 MG PO TABS: ORAL_TABLET | ORAL | Status: DC

## 2012-02-29 NOTE — Progress Notes (Signed)
Subjective:    Currently, the patient states she didn't sleep very well overnight. She continues to have LLQ pain.  Interval Events: - None.   Objective:    Vital Signs:   Temp:  [97.8 F (36.6 C)-99.1 F (37.3 C)] 97.8 F (36.6 C) (05/20 0912) Pulse Rate:  [58-106] 58  (05/20 0912) Resp:  [17-18] 18  (05/20 0912) BP: (132-141)/(54-87) 133/64 mmHg (05/20 0912) SpO2:  [93 %-97 %] 97 % (05/20 0912) Weight:  [182 lb 1.6 oz (82.6 kg)] 182 lb 1.6 oz (82.6 kg) (05/19 2154) Last BM Date: 02/27/12  24-hour weight change: Weight change: 1 lb 9.6 oz (0.726 kg)  Intake/Output:   Intake/Output Summary (Last 24 hours) at 02/29/12 0946 Last data filed at 02/29/12 0912  Gross per 24 hour  Intake    843 ml  Output   1250 ml  Net   -407 ml      Physical Exam: General: Vital signs reviewed and noted. Well-developed, well-nourished, in no acute distress; alert, appropriate and cooperative throughout examination.  Lungs:  Normal respiratory effort. Clear to auscultation BL without crackles or wheezes.  Heart: RRR. S1 and S2 normal without gallop, or rubs. (+) murmur.  Abdomen:  BS normoactive. Soft, Nondistended, LLQ tenderness to palpation, no rebound tenderness, no guarding. No masses or organomegaly.  Extremities: No pretibial edema.     Labs:  Basic Metabolic Panel:  Lab 02/28/12 1610 02/27/12 0500 02/26/12 1100  NA 141 139 141  K 3.7 4.9 3.6  CL 109 107 106  CO2 22 24 24   GLUCOSE 99 106* 112*  BUN 10 9 15   CREATININE 0.64 0.59 0.59  CALCIUM 8.4 8.5 9.2  MG -- -- --  PHOS -- -- --    Liver Function Tests:  Lab 02/27/12 0500  AST 37  ALT 11  ALKPHOS 50  BILITOT 0.4  PROT 6.4  ALBUMIN 3.3*    CBC:  Lab 02/28/12 0546 02/27/12 0825 02/27/12 0115 02/26/12 1725 02/26/12 1100  WBC 9.6 12.3* 11.1* 9.3 10.1  NEUTROABS 5.7 -- -- -- 7.6  HGB 10.6* 11.8* 11.8* 12.0 12.8  HCT 31.3* 35.4* 35.4* 35.7* 37.5  MCV 91.5 92.9 91.9 91.8 90.4  PLT 168 183 191 186 212     Cardiac Enzymes:  Lab 02/26/12 1738  CKTOTAL 63  CKMB 2.5  CKMBINDEX --  TROPONINI <0.30    Fasting Lipid Panel:  Ref. Range 02/27/2012 05:00  Cholesterol Latest Range: 0-200 mg/dL 960  Triglycerides Latest Range: <150 mg/dL 454 (H)  HDL Latest Range: >39 mg/dL 45  LDL (calc) Latest Range: 0-99 mg/dL 68  VLDL Latest Range: 0-40 mg/dL 40  Total CHOL/HDL Ratio No range found 3.4    Imaging:  Ct Abdomen Pelvis W Contrast (02/26/2012) - Abnormal wall thickening inflammatory change at the splenic flexure.  Considerations include infectious colitis, diverticulitis, inflammatory bowel disease.  Less slight considerations would include ischemia and neoplasm.  Original Report Authenticated By: Donavan Burnet, M.D.   Colonoscopy (02/28/2012) - Colitis in the left colon - consistent with ischemic colitis 2) Otherwise normal examination into terminalileum. Random biopsies taken of normal colon to look for micro colitis - suspect IBS 3 Lax anal sphincter   Historical Data:  Last colonoscopy (03/31/2010) - the sigmoid colon was somewhat difficult (due to fixed tortuosity of sigmoid colon). Internal hemorrhoids were found, and they were small. The terminal ileum appeared normal. Repeat colonoscopy in 5 years for screening purposes. EGD (12/14/2003) - a small hiatal hernia  was found. Nonbleeding, erythematous gastropathy. Normal examined duodenum. Biopsies showing antral mucosa with nonspecific chronic gastritis and foveolar hyperplasia (reactive gastropathy).     Medications:    Infusions:    Scheduled Medications:    . antiseptic oral rinse  15 mL Mouth Rinse BID  . dicyclomine  20 mg Oral TID AC  . pantoprazole  40 mg Oral Q0600  . sodium chloride  3 mL Intravenous Q12H    PRN Medications: HYDROcodone-acetaminophen   Assessment/ Plan:    Pt is a 71 y.o. yo female with a PMHx of GERD, thyroid disease, chronic pain, incontinence of urine who was admitted on 02/26/2012 with  symptoms of BRBPR. Cause of bleed is unclear at this time, and GI service is consulting to provide further input. Records have been requested from her GI physician at [facility], Dr. Markham Jordan.   1) GI bleed - the cause of this patient's GI bleed is thought likely secondary to ischemic colitis, which would be consistent with colitis noted on colonoscopy performed (05/19) Biopsies have been taken and will be followed up as an outpatient with Dr. Leone Payor. Celiac disease was ruled out by her primary gastroenterologist as a cause for her chronic diarrhea, but ruled out..   Plan:  - Appreciate GI assistance and input in the management of our patient.  - Continue protonix. - Continue oral pain medications.  2) IBS with chronic LLQ abdominal pain -  - Continue low residue diet - Appreciate GI assistance and input in the management of our patient. - Dr. Leone Payor will consider IBS medications in future if needed, such as Lotronex.  3) Hyperlipidemia - mild elevation of triglycerides. Otherwise, well controlled - Will recommend diet modification.  4) H/O thyroid goiter - although patient self-reports thyroid disease (hypothyroidism after remote thyroid goiter removal), TSH and free T4 are normal. Therefore, patient does not appear to have active hypothyroidism at this time.  5) DVT PPX - SCDs  6) Dispo - dc home today.   Length of Stay: 3 days   Johnette Abraham, Norman Herrlich, Internal Medicine Resident 02/29/2012, 9:46 AM

## 2012-02-29 NOTE — Progress Notes (Signed)
Internal Medicine Attending  Date: 02/29/2012  Patient name: Sherri Murray Medical record number: [id number] Date of birth: Apr 23, 1941 Age: 71 y.o. Gender: female  I saw and evaluated the patient. I reviewed the resident's note by Dr. Saralyn Pilar and I agree with the resident's findings and plans as documented in her progress note.  Ms. Casad is without complaints this morning. Her abdominal pain is improved and she's had no more  bleeding from her rectum. Colonoscopy was consistent with ischemic colitis. She states her weight has actually increased not decreased as of late. Therefore I don't think she has a critical vascular stenosis to the bowels. Nonetheless, she was advised to take aspirin 81 mg a day and to monitor her symptoms. Specifically she was asked to note if her pain occurred after eating. She was also asked to watch her weight to make sure it was not decreasing unexpectedly. If it does she was asked to notify someone. I agree with the housestaff's plan to discharge her home today with followup in the [facility] Medicine [facility].

## 2012-02-29 NOTE — Discharge Instructions (Addendum)
Thank you for allowing [place] to be involved in your healthcare while you were hospitalized at [facility].   Please note that there have been changes to your home medications. Specifically, as follows: 1. STOP Aleve (you should not be taking duplicate anti-inflammatory medications, because it can cause disruption to your stomach) - Please see List of NSAID (antiinflammatory) medications listed below and please make sure you are not taking more than one of these. 2. START hydrocodone-acetominophen for your pain - You have been started on a new medication that can cause drowsiness, do not drive or operate heavy machinery . Do not take this medication with alcohol. Do not take more than is prescribed. 3. START bentyl for your IBS. 4. START Protonix for your reflux, and gastritis (inflammation of your stomach) 5. START baby aspirin daily (if causes abnormal side effects, stop the medication)   Please see your discharge paperwork for your follow-up appointments - you will need to follow-up at the Internal medicine outpatient clinic as well as with Dr. Leone Payor at [facility] gastroenterology.   Please call the Internal Medicine Outpatient Clinic at [phone] if you have any questions or concerns, or any difficulty getting any of your medications.   If you have been started on new medication(s), and you develop symptoms concerning for allergic reaction, including, but not limited to, throat closing, tongue swelling, rash, please stop the medication immediately and call the clinic at [phone], and go to the ER.   Low Fiber and Residue Restricted Diet A low fiber diet restricts foods that contain carbohydrates that are not digested in the small intestine. A diet containing about 10 g of fiber is considered low fiber. The diet needs to be individualized to suit patient tolerances and preferences and to avoid unnecessary restrictions. Generally, the foods emphasized in a low fiber diet have no  skins or seeds. They may have been processed to remove bran, germ, or husks. Cooking may not necessarily eliminate the fiber. Cooking may, in fact, enable a greater quantity of fiber to be consumed in a lesser volume. Legumes and nuts are also restricted. The term low residue has also been used to describe low fiber diets, although the two are not the same. Residue refers to any substance that adds to bowel (colonic) contents, such as sloughed cells and intestinal bacteria, in addition to fiber. Residue-containing foods, prunes and prune juice, milk, and connective tissue from meats may also need to be eliminated. It is important to eliminate these foods during sudden (acute) attacks of inflammatory bowel disease, when there is a partial obstruction due to another reason, or when minimal fecal output is desired. When these problems are gone, a more normal diet may be used. PURPOSE  Prevent blockage of a partially obstructed or narrowed gastrointestinal tract.   Reduce stool weight and volume.   Slow the movement of waste.  WHEN IS THIS DIET USED?  Acute phase of Crohn's disease, ulcerative colitis, regional enteritis, or diverticulitis.   Narrowing (stenosis) of intestinal or esophageal tubes (lumina).   Transitional diet following surgery, injury (trauma), or illness.  ADEQUACY This diet is nutritionally adequate based on individual food choices according to the Recommended Dietary Allowances of the [employer]. CHOOSING FOODS Check labels, especially on foods from the starch list. Often, dietary fiber content is listed with the Nutrition Facts panel.  Breads and Starches  Allowed: White, [place], and pita breads, plain rolls, buns, or sweet rolls, doughnuts, waffles, pancakes, bagels. Plain muffins, sweet  breads, biscuits, matzoth. Flour. Soda, saltine, or graham crackers. Pretzels, rusks, melba toast, zwieback. Cooked cereals: cornmeal, farina, cream cereals. Dry cereals:  refined corn, wheat, rice, and oat cereals (check label). Potatoes prepared any way without skins, refined macaroni, spaghetti, noodles, refined rice.   Avoid: Bread, rolls, or crackers made with whole-wheat, multigrains, rye, bran seeds, nuts, or coconut. Corn tortillas, table-shells. Corn chips, tortilla chips. Cereals containing whole-grains, multigrains, bran, coconut, nuts, or raisins. Cooked or dry oatmeal. Coarse wheat cereals, granola. Cereals advertised as "high fiber." Potato skins. Whole-grain pasta, wild or brown rice. Popcorn.  Vegetables  Allowed:  Strained tomato and vegetable juices. Fresh: tender lettuce, cucumber, cabbage, spinach, bean sprouts. Cooked, canned: asparagus, bean sprouts, cut green or wax beans, cauliflower, pumpkin, beets, mushrooms, olives, spinach, yellow squash, tomato, tomato sauce (no seeds), zucchini (peeled), turnips. Canned sweet potatoes. Small amounts of celery, onion, radish, and green pepper may be used. Keep servings limited to  cup.   Avoid: Fresh, cooked, or canned: artichokes, baked beans, beet greens, broccoli, Brussels sprouts, French-style green beans, corn, kale, legumes, peas, sweet potatoes. Cooked: green or red cabbage, spinach. Avoid large servings of any vegetables.  Fruit  Allowed:  All fruit juices except prune juice. Cooked or canned: apricots applesauce, cantaloupe, cherries, grapefruit, grapes, kiwi, mandarin oranges, peaches, pears, fruit cocktail, pineapple, plums, watermelon. Fresh: banana, grapes, cantaloupe, avocado, cherries, pineapple, grapefruit, kiwi, nectarines, peaches, oranges, blueberries, plums. Keep servings limited to  cup or 1 piece.   Avoid: Fresh: apple with or without skin, apricots, mango, pears, raspberries, strawberries. Prune juice, stewed or dried prunes. Dried fruits, raisins, dates. Avoid large servings of all fresh fruits.  Meat and Meat Substitutes  Allowed:  Ground or well-cooked tender beef, ham, veal,  lamb, pork, or poultry. Eggs, plain cheese. Fish, oysters, shrimp, lobster, other seafood. Liver, organ meats.   Avoid: Tough, fibrous meats with gristle. Peanut butter, smooth or chunky. Cheese with seeds, nuts, or other foods not allowed. Nuts, seeds, legumes, dried peas, beans, lentils.  Milk  Allowed:  All milk products except those not allowed. Milk and milk product consumption should be minimal when low residue is desired.   Avoid: Yogurt that contains nuts or seeds.  Soups and Combination Foods  Allowed:  Bouillon, broth, or cream soups made from allowed foods. Any strained soup. Casseroles or mixed dishes made with allowed foods.   Avoid: Soups made from vegetables that are not allowed or that contain other foods not allowed.  Desserts and Sweets  Allowed:  Plain cakes and cookies, pie made with allowed fruit, pudding, custard, cream pie. Gelatin, fruit, ice, sherbet, frozen ice pops. Ice cream, ice milk without nuts. Plain hard candy, honey, jelly, molasses, syrup, sugar, chocolate syrup, gumdrops, marshmallows.   Avoid: Desserts, cookies, or candies that contain nuts, peanut butter, or dried fruits. Jams, preserves with seeds, marmalade.  Fats and Oils  Allowed:  Margarine, butter, cream, mayonnaise, salad oils, plain salad dressings made from allowed foods. Plain gravy, crisp bacon without rind.   Avoid: Seeds, nuts, olives. Avocados.  Beverages  Allowed:  All, except those listed to avoid.   Avoid: Fruit juices with high pulp, prune juice.  Condiments  Allowed:  Ketchup, mustard, horseradish, vinegar, cream sauce, cheese sauce, cocoa powder. Spices in moderation: allspice, basil, bay leaves, celery powder or leaves, cinnamon, cumin powder, curry powder, ginger, mace, marjoram, onion or garlic powder, oregano, paprika, parsley flakes, ground pepper, rosemary, sage, savory, tarragon, thyme, turmeric.   Avoid: Coconut, pickles.  SAMPLE MEAL PLAN The following menu is  provided as a sample. Your daily menu plans will vary. Be sure to include a minimum of the following each day in order to provide essential nutrients for the adult:  Starch/Bread/Cereal Group, 6 servings.   Fruit/Vegetable Group, 5 servings.   Meat/Meat Substitute Group, 2 servings.   Milk/Milk Substitute Group, 2 servings.  A serving is equal to  cup for fruits, vegetables, and cooked cereals or 1 piece for foods such as a piece of bread, 1 orange, or 1 apple. For dry cereals and crackers, use serving sizes listed on the label. Combination foods may count as full or partial servings from various food groups. Fats, desserts, and sweets may be added to the meal plan after the requirements for essential nutrients are met. SAMPLE MENU Breakfast   cup orange juice.   1 boiled egg.   1 slice white toast.   Margarine.    cup cornflakes.   1 cup milk.   Beverage.  Lunch   cup chicken noodle soup.   2 to 3 oz sliced roast beef.   2 slices seedless rye bread.   Mayonnaise.    cup tomato juice.   1 small banana.   Beverage.  Dinner  3 oz baked chicken.    cup scalloped potatoes.    cup cooked beets.   White dinner roll.   Margarine.    cup canned peaches.   Beverage.  Document Released: 03/20/2002 Document Revised: 09/17/2011 Document Reviewed: 08/31/2011 [facility] Patient Information 2012 [place], [place].   List of NSAIDS (Non-steroidal anti-inflammatory medications)  Choline magnesium trisalicylate (Trilisate)  Diflunisal (Dolobid)  Celecoxib (Celebrex) Diclofenac (CambiaT, Cataflam, Flector, Pennsaid, Solaraze Gel, Voltaren, Voltaren Gel, Voltaren-XR, Voltaren Ophthalmic, ZipsorT)   Etodolac (Lodine, Lodine XL)  Fenoprofen (Nalfon)  Flurbiprofen (Ansaid)  Ibuprofen (Motrin, Advil, Nuprin)  Indomethacin (Indocin, Indocin SR)  Ketoprofen (Orudis, Actron, Oruvail)  Ketorolac (SprixT, Toradol)  Meclofenamate (Meclomen)    Meloxicam (Mobic)  Nabumetone (Relafen)  Naproxen (Naprosyn, Aleve, Anaprox, Naprelan)  Oxaprozin (Daypro)  Piroxicam (Feldene)  Salsalate (Salflex, Disalcid, Amigesic)  Sulindac (Clinoril)  Tolmetin (Tolectin)

## 2012-02-29 NOTE — Progress Notes (Signed)
AVS reviewed with pt and family at bedside. Pt able to verbalize understanding of AVS instructions and had questions answered. IV and tele DC'd. Pt remains stable. Pt escorted to exit of facility via wheelchair. [place], Rosanna Randy
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
## 2012-03-02 ENCOUNTER — Encounter: Payer: Self-pay | Admitting: Internal Medicine

## 2012-03-02 NOTE — Care Management Note (Signed)
    Page 1 of 1   03/02/2012     2:25:13 PM   CARE MANAGEMENT NOTE 03/02/2012  Patient:  Sherri Murray, Sherri Murray   Account Number:  [id number]  Date Initiated:  03/02/2012  Documentation initiated by:  Jim Like  Subjective/Objective Assessment:   Pt is 71 yr old admitted for blood in stool and bleeding from rectum     Action/Plan:   Continue to follow for CM/discharge planning needs   Anticipated DC Date:  02/29/2012   Anticipated DC Plan:  HOME/SELF CARE      DC Planning Services  CM consult      Choice offered to / List presented to:             Status of service:  Completed, signed off Medicare Important Message given?   (If response is "NO", the following Medicare IM given date fields will be blank) Date Medicare IM given:   Date Additional Medicare IM given:    Discharge Disposition:  HOME/SELF CARE  Per UR Regulation:  Reviewed for med. necessity/level of care/duration of stay  If discussed at Long Length of Stay Meetings, dates discussed:    Comments:

## 2012-03-02 NOTE — Progress Notes (Signed)
Quick Note:  Letter created No recall ______

## 2012-03-08 ENCOUNTER — Encounter: Payer: PRIVATE HEALTH INSURANCE | Admitting: Internal Medicine

## 2012-03-16 ENCOUNTER — Ambulatory Visit (INDEPENDENT_AMBULATORY_CARE_PROVIDER_SITE_OTHER): Payer: PRIVATE HEALTH INSURANCE | Admitting: Internal Medicine

## 2012-03-16 ENCOUNTER — Encounter: Payer: Self-pay | Admitting: Internal Medicine

## 2012-03-16 VITALS — BP 142/77 | HR 61 | Temp 97.0°F | Ht 65.5 in | Wt 169.6 lb

## 2012-03-16 DIAGNOSIS — D649 Anemia, unspecified: Secondary | ICD-10-CM

## 2012-03-16 DIAGNOSIS — K55039 Acute (reversible) ischemia of large intestine, extent unspecified: Secondary | ICD-10-CM

## 2012-03-16 DIAGNOSIS — R159 Full incontinence of feces: Secondary | ICD-10-CM | POA: Insufficient documentation

## 2012-03-16 DIAGNOSIS — K55059 Acute (reversible) ischemia of intestine, part and extent unspecified: Secondary | ICD-10-CM

## 2012-03-16 DIAGNOSIS — D62 Acute posthemorrhagic anemia: Secondary | ICD-10-CM | POA: Insufficient documentation

## 2012-03-16 LAB — CBC
HCT: 36.9 % (ref 36.0–46.0)
Hemoglobin: 12.1 g/dL (ref 12.0–15.0)
MCH: 30 pg (ref 26.0–34.0)
MCHC: 32.8 g/dL (ref 30.0–36.0)
MCV: 91.6 fL (ref 78.0–100.0)
Platelets: 350 10*3/uL (ref 150–400)
RBC: 4.03 MIL/uL (ref 3.87–5.11)
WBC: 6.8 10*3/uL (ref 4.0–10.5)

## 2012-03-16 MED ORDER — PNEUMOCOCCAL VAC POLYVALENT 25 MCG/0.5ML IJ INJ: 0.5000 mL | INJECTION | INTRAMUSCULAR | Status: AC

## 2012-03-16 NOTE — Assessment & Plan Note (Signed)
No signs (high HR, melena) or symptoms (orthostasis fatigue)consistent with anemia/ blood loss. WIll recheck Hb today

## 2012-03-16 NOTE — Patient Instructions (Signed)
Keep taking your medicines regularly.   Go to your local pharmacy or the health department to get the shingles vaccine. It should only cost around $20.  If any of your lab results are abnormal, we will contact you by phone or send you a letter. If they are normal, we will not contact you, but will be happy to discuss them at your next clinic appointment.  Return to clinic to see your PCP in 6-12 months, or sooner if you're having problems. Please bring all your medications to your next clinic appointment.     Patient information: Ischemic bowel disease (The Basics) What is ischemic bowel disease? -- Ischemic bowel disease is a condition in which there is not enough blood flow to the intestines. It can happen in the large intestine or the small intestine (figure 1). It can cause belly pain, nausea, diarrhea, and other symptoms.  What are the symptoms of ischemic bowel disease? -- The symptoms include: Belly pain - This can be mild or severe. It can:  Start suddenly, or happen over several days or years.  Start about 1 hour after eating, and last about 2 hours. Eating a big meal can make this pain even worse.  Nausea, vomiting, or both  Blood in bowel movements - This can be bloody diarrhea  Weight loss, when not trying to lose weight  Eating problems, such as:  Food fear -- Not wanting to eat because pain starts after eating  Feeling full too quickly when eating  Will I need tests? -- Yes. The doctor or nurse will ask about the symptoms and do an exam. He or she will also order 1 or more tests. These can include: X-ray, CT scan, or ultrasound of the belly - These imaging tests create pictures of the inside of the body. They can help show the cause of symptoms.  Blood tests - These can show signs of bad nutrition. Or they might show that a different condition is causing symptoms.  Tests called "endoscopy," "upper endoscopy," "sigmoidoscopy," or "colonoscopy" (figure 2) - For these tests, the  doctor puts a thin tube down your throat and into your stomach, or up your rectum and into your colon. The tube has a camera attached so the doctor can look inside your body. The tube also has tools attached to it, which the doctor can use to take samples of tissue. These samples can go to the lab to be checked for problems.  A test called an "arteriogram" - For this test, the doctor injects a dye into the arteries that carry blood to the bowel. This dye can be seen with an imaging test such as a CT, MRI, or fluoroscopy (a moving X-ray). The test can show if arteries around the bowel are blocked. If they are, this could be keeping part of the bowel from getting enough blood.  Laparoscopy - This is a procedure that the doctor does in the operating room. The doctor makes a small cut near the belly button. Then he or she puts a small device called a "laparoscope" inside. The doctor looks through the laparoscope to see if he or she can find the cause of symptoms.  How is ischemic bowel disease treated? -- Treatment for ischemic bowel disease depends on: What is blocking the arteries around the bowel  Whether the symptoms started suddenly or came on over a long time Most people with ischemic bowel disease need treatment in the hospital. In the hospital, the doctor can:  Give you fluids and nutrition through an "IV" (a small tube that goes into your vein)  Put a thin tube called a "nasogastric tube" in your nose, down your esophagus, and into your stomach. If there is extra fluid and air in your stomach or intestines, the tube can suck it up. This will make you feel better and help keep you from vomiting. Other treatments can include: Antibiotics to prevent or stop infection  A procedure to open a blocked artery  Surgery to take out part of the bowel that is not healthy These treatments can help the bowel work correctly again

## 2012-03-16 NOTE — Assessment & Plan Note (Signed)
No signs of active acute ischemia colitis. Biopsy result from hospitalization colonoscopy was also consistent with ischemic colitis.  I suspect that her rebound of pain is more c/w IBS than ischemic colitis, as I do not know how skipping 1 day would cause a rebound that would resolve so quickly. No melena, hematochezia or other worrisome sign. Will continue to monitor. Pt will follow up in 6 months to 1 year

## 2012-03-16 NOTE — Progress Notes (Signed)
Addended by: Quentin Ore C on: 03/16/2012 09:07 PM   Modules accepted: Level of Service

## 2012-03-16 NOTE — Assessment & Plan Note (Signed)
Incontinence is improved with her current therapy. We will make no changes.

## 2012-03-16 NOTE — Progress Notes (Signed)
Subjective:     Patient ID: Sherri Murray, female   DOB: 07-Feb-1941, 71 y.o.   MRN: [id number]  Abdominal Pain This is a chronic problem. The current episode started yesterday (Sherri Murray states that she was taking all of her medicines and had no pain until yesterday. She decided to test if her meds worked and did not take any of them. She developed the pain only after not taking her meds. ). The onset quality is gradual. The problem occurs rarely. The problem has been resolved. The pain is located in the LUQ. The pain is at a severity of 6/10. The quality of the pain is colicky and cramping. Associated symptoms include arthralgias and diarrhea. Pertinent negatives include no anorexia, belching, constipation, dysuria, fever, flatus, headaches, hematochezia, hematuria, melena, nausea, vomiting or weight loss. Associated symptoms comments: No orthostasis. The pain is aggravated by eating (eating high residue foods (nuts, beans, fibrous foods)). She has tried antacids, proton pump inhibitors and oral narcotic analgesics for the symptoms. The treatment provided significant (She has done reasonable well with maintaining her low residue diet (avoids nuts, seeds, fibrous foods)) relief. Her past medical history is significant for GERD and irritable bowel syndrome.   Incontinence: She states that she is taking a new medicine for her incontenance (mirabegron) which is helping a lot. She was inconinent of stool nearly daily prior to this medicine, but has not been incontinent for a couple of weeks now.     Review of Systems  Constitutional: Negative for fever, chills, weight loss, diaphoresis and fatigue.  Respiratory: Negative for shortness of breath.   Gastrointestinal: Positive for abdominal pain and diarrhea. Negative for nausea, vomiting, constipation, blood in stool, melena, hematochezia, anal bleeding, anorexia and flatus.  Genitourinary: Negative for dysuria and hematuria.  Musculoskeletal: Positive for  arthralgias.  Skin: Negative for pallor.  Neurological: Negative for syncope, weakness, light-headedness and headaches.       Objective:   Physical Exam  Vitals reviewed. Constitutional: She appears well-developed and well-nourished. No distress.  HENT:  Head: Atraumatic.  Cardiovascular: Normal rate, regular rhythm and normal heart sounds.  Exam reveals no gallop and no friction rub.   No murmur heard. Pulmonary/Chest: Effort normal and breath sounds normal. No respiratory distress. She has no wheezes. She has no rales. She exhibits no tenderness.  Abdominal: Soft. She exhibits no distension, no fluid wave, no ascites and no mass. Bowel sounds are increased. There is no hepatosplenomegaly. There is tenderness in the left upper quadrant. There is no rigidity, no rebound, no guarding and negative Murphy's sign. No hernia.  Musculoskeletal: She exhibits no edema.  Neurological: She is alert. No cranial nerve deficit. Coordination normal.  Skin: Skin is warm and dry. No rash noted. She is not diaphoretic.  Psychiatric: She has a normal mood and affect.       Assessment:     Please see problem oriented charting for assessment and plan by problem (best viewed under encounters tab).

## 2012-03-21 ENCOUNTER — Telehealth: Payer: Self-pay

## 2012-03-21 ENCOUNTER — Ambulatory Visit (INDEPENDENT_AMBULATORY_CARE_PROVIDER_SITE_OTHER): Payer: PRIVATE HEALTH INSURANCE | Admitting: Internal Medicine

## 2012-03-21 ENCOUNTER — Encounter: Payer: Self-pay | Admitting: Internal Medicine

## 2012-03-21 ENCOUNTER — Other Ambulatory Visit: Payer: Self-pay | Admitting: Internal Medicine

## 2012-03-21 VITALS — BP 134/72 | HR 60 | Ht 65.0 in | Wt 169.0 lb

## 2012-03-21 DIAGNOSIS — R197 Diarrhea, unspecified: Secondary | ICD-10-CM

## 2012-03-21 DIAGNOSIS — K559 Vascular disorder of intestine, unspecified: Secondary | ICD-10-CM

## 2012-03-21 DIAGNOSIS — R159 Full incontinence of feces: Secondary | ICD-10-CM

## 2012-03-21 DIAGNOSIS — D62 Acute posthemorrhagic anemia: Secondary | ICD-10-CM

## 2012-03-21 DIAGNOSIS — K589 Irritable bowel syndrome without diarrhea: Secondary | ICD-10-CM

## 2012-03-21 DIAGNOSIS — R32 Unspecified urinary incontinence: Secondary | ICD-10-CM

## 2012-03-21 DIAGNOSIS — K625 Hemorrhage of anus and rectum: Secondary | ICD-10-CM

## 2012-03-21 NOTE — Progress Notes (Signed)
  Subjective:    Patient ID: Sherri Murray, female    DOB: 08/28/1941, 71 y.o.   MRN: [id number]  HPI This is a pleasant elderly white woman with IBS and a recent ischemic colitis episode. I met her when hospitalized. A colonoscopy showed left-sided ischemic colitis. Random colon biopsies of the normal colon did not show any inflammatory bowel disease i.e. they were normal. Since that time she has used dicyclomine with some success, as well as a low-residue diet. She has a lax anal sphincter and fecal as well as urinary incontinence problems. She was helping her daughter move this past weekend and did some heavy lifting and think she saw some small spots of faint blood on the pad but otherwise has been improved and has not had rectal bleeding. Dicyclomine does seem to help her abdominal pain. She notes that in the past she has used Imodium to help with diarrhea but was concerned that taking that chronically may be problematic. She is not sure she's ever been checked for celiac disease.  She is asking for a gynecology recommendation.  Medications, allergies, past medical history, past surgical history, family history and social history are reviewed and updated in the EMR.  Review of Systems As per history of present illness    Objective:   Physical Exam Well-developed well-nourished no acute distress Abdomen is soft and nontender n mass, bowel sounds are present      Assessment & Plan:   1. IBS (irritable bowel syndrome)   Better on low-residue diet and dicyclomine. ? Lotronex candidate. Info given. Would probably do flex sig before starting that.  2. Ischemic colitis   Clinically resolved.  3. Acute posthemorrhagic anemia   Resolved  Lab Results  Component Value Date   WBC 6.8 03/16/2012   HGB 12.1 03/16/2012   HCT 36.9 03/16/2012   MCV 91.6 03/16/2012   PLT 350 03/16/2012     4. Fecal incontinence   This is probably somewhat better but her lax sphincter and diarrhea predominant IBS  cause issues. Add loperamide to her regimen taking one every day. Reassess in 2 months. May need altering of the sphincter with bulking injection therapy, possible colorectal surgeon referral. He will be interesting to see what the gynecologists say about her urinary incontinence. He has been working with urologist in the [place] area.    5. Rectal bleeding - possible   Has had hemorrhoids on prior colonoscopy and sounds like anorectal bleeding.   Note that I had intended to order tissue transglutaminase antibody IgA level. She probably does not have celiac disease but screening as appropriate. If that can be done a two-month followup or she can come back,  will let her know.

## 2012-03-21 NOTE — Progress Notes (Signed)
Addended by: [place], Citlali Gautney E on: 03/21/2012 10:30 AM   Modules accepted: Orders

## 2012-03-21 NOTE — Telephone Encounter (Signed)
Pt contacted and informed that we need to do the Celiac testing, orders put in computer and pt will try and come tomorrow and get drawn.

## 2012-03-21 NOTE — Patient Instructions (Signed)
We have made you an appointment at the [facility] for 03/30/12 at 10:30am.  They are located at [address] [place]  [place] [address], phone # [phone].  Please take your insurance card with you.  We have given you Lotronex info to read over.  Take Immodium over the counter one capsule every day, may take up to four capsules a day.  Hold for constipation.

## 2012-03-24 ENCOUNTER — Other Ambulatory Visit (INDEPENDENT_AMBULATORY_CARE_PROVIDER_SITE_OTHER): Payer: PRIVATE HEALTH INSURANCE

## 2012-03-24 DIAGNOSIS — R197 Diarrhea, unspecified: Secondary | ICD-10-CM

## 2012-03-28 NOTE — Progress Notes (Signed)
Quick Note:  Let her know test is negative for celiac disease  ______

## 2012-03-30 ENCOUNTER — Ambulatory Visit (INDEPENDENT_AMBULATORY_CARE_PROVIDER_SITE_OTHER): Payer: PRIVATE HEALTH INSURANCE | Admitting: Obstetrics & Gynecology

## 2012-03-30 ENCOUNTER — Encounter: Payer: Self-pay | Admitting: Obstetrics & Gynecology

## 2012-03-30 ENCOUNTER — Other Ambulatory Visit (HOSPITAL_COMMUNITY)
Admission: RE | Admit: 2012-03-30 | Discharge: 2012-03-30 | Disposition: A | Discharge status: Home / Self Care | Payer: PRIVATE HEALTH INSURANCE | Source: Ambulatory Visit | Attending: Obstetrics & Gynecology | Admitting: Obstetrics & Gynecology

## 2012-03-30 VITALS — BP 165/76 | HR 76 | Ht 65.0 in | Wt 169.0 lb

## 2012-03-30 DIAGNOSIS — Z01419 Encounter for gynecological examination (general) (routine) without abnormal findings: Secondary | ICD-10-CM

## 2012-03-30 DIAGNOSIS — R32 Unspecified urinary incontinence: Secondary | ICD-10-CM

## 2012-03-30 DIAGNOSIS — Z124 Encounter for screening for malignant neoplasm of cervix: Secondary | ICD-10-CM

## 2012-03-30 DIAGNOSIS — Z1231 Encounter for screening mammogram for malignant neoplasm of breast: Secondary | ICD-10-CM

## 2012-03-30 DIAGNOSIS — N3281 Overactive bladder: Secondary | ICD-10-CM | POA: Insufficient documentation

## 2012-03-30 NOTE — Patient Instructions (Signed)
Urinary Incontinence Your doctor wants you to have this information about urinary incontinence. This is the inability to keep urine in your body until you decide to release it. CAUSES  There are many different causes for losing urinary control. They include:  Medicines.   Infections.   Prostate problems.   Surgery.   Neurological diseases.   Emotional factors.  DIAGNOSIS  Evaluating the cause of incontinence is important in choosing the best treatment. This may require:  An ultrasound exam.   Kidney and bladder X-rays.   Cystoscopy. This is an exam of the bladder using a narrow scope.  TREATMENT  For incontinent patients, normal daily hygiene and using changing pads or adult diapers regularly will prevent offensive odors and skin damage from the moisture. Changing your medicines may help control incontinence. Your caregiver may prescribe some medicines to help you regain control. Avoid caffeine. It can over-stimulate the bladder. Use the bathroom regularly. Try about every 2 to 3 hours even if you do not feel the need. Take time to empty your bladder completely. After urinating, wait a minute. Then try to urinate again. External devices used to catch urine or an indwelling urine catheter (Foley catheter) may be needed as well. Some prostate gland problems require surgery to correct. Call your caregiver for more information. Document Released: 11/05/2004 Document Revised: 09/17/2011 Document Reviewed: 10/31/2008 [facility] Patient Information 2012 [place], [place].  Preventive Care for Adults, Female A healthy lifestyle and preventive care can promote health and wellness. Preventive health guidelines for women include the following key practices.  A routine yearly physical is a good way to check with your caregiver about your health and preventive screening. It is a chance to share any concerns and updates on your health, and to receive a thorough exam.   Visit your dentist  for a routine exam and preventive care every 6 months. Brush your teeth twice a day and floss once a day. Good oral hygiene prevents tooth decay and gum disease.   The frequency of eye exams is based on your age, health, family medical history, use of contact lenses, and other factors. Follow your caregiver's recommendations for frequency of eye exams.   Eat a healthy diet. Foods like vegetables, fruits, whole grains, low-fat dairy products, and lean protein foods contain the nutrients you need without too many calories. Decrease your intake of foods high in solid fats, added sugars, and salt. Eat the right amount of calories for you.Get information about a proper diet from your caregiver, if necessary.   Regular physical exercise is one of the most important things you can do for your health. Most adults should get at least 150 minutes of moderate-intensity exercise (any activity that increases your heart rate and causes you to sweat) each week. In addition, most adults need muscle-strengthening exercises on 2 or more days a week.   Maintain a healthy weight. The body mass index (BMI) is a screening tool to identify possible weight problems. It provides an estimate of body fat based on height and weight. Your caregiver can help determine your BMI, and can help you achieve or maintain a healthy weight.For adults 20 years and older:   A BMI below 18.5 is considered underweight.   A BMI of 18.5 to 24.9 is normal.   A BMI of 25 to 29.9 is considered overweight.   A BMI of 30 and above is considered obese.   Maintain normal blood lipids and cholesterol levels by exercising and  minimizing your intake of saturated fat. Eat a balanced diet with plenty of fruit and vegetables. Blood tests for lipids and cholesterol should begin at age 34 and be repeated every 5 years. If your lipid or cholesterol levels are high, you are over 50, or you are at high risk for heart disease, you may need your cholesterol  levels checked more frequently.Ongoing high lipid and cholesterol levels should be treated with medicines if diet and exercise are not effective.   If you smoke, find out from your caregiver how to quit. If you do not use tobacco, do not start.   If you are pregnant, do not drink alcohol. If you are breastfeeding, be very cautious about drinking alcohol. If you are not pregnant and choose to drink alcohol, do not exceed 1 drink per day. One drink is considered to be 12 ounces (355 mL) of beer, 5 ounces (148 mL) of wine, or 1.5 ounces (44 mL) of liquor.   Avoid use of street drugs. Do not share needles with anyone. Ask for help if you need support or instructions about stopping the use of drugs.   High blood pressure causes heart disease and increases the risk of stroke. Your blood pressure should be checked at least every 1 to 2 years. Ongoing high blood pressure should be treated with medicines if weight loss and exercise are not effective.   If you are 21 to 72 years old, ask your caregiver if you should take aspirin to prevent strokes.   Diabetes screening involves taking a blood sample to check your fasting blood sugar level. This should be done once every 3 years, after age 55, if you are within normal weight and without risk factors for diabetes. Testing should be considered at a younger age or be carried out more frequently if you are overweight and have at least 1 risk factor for diabetes.   Breast cancer screening is essential preventive care for women. You should practice "breast self-awareness." This means understanding the normal appearance and feel of your breasts and may include breast self-examination. Any changes detected, no matter how small, should be reported to a caregiver. Women in their 76s and 30s should have a clinical breast exam (CBE) by a caregiver as part of a regular health exam every 1 to 3 years. After age 22, women should have a CBE every year. Starting at age 88, women  should consider having a mammography (breast X-ray test) every year. Women who have a family history of breast cancer should talk to their caregiver about genetic screening. Women at a high risk of breast cancer should talk to their caregivers about having magnetic resonance imaging (MRI) and a mammography every year.   The Pap test is a screening test for cervical cancer. A Pap test can show cell changes on the cervix that might become cervical cancer if left untreated. A Pap test is a procedure in which cells are obtained and examined from the lower end of the uterus (cervix).   Women should have a Pap test starting at age 11.   Between ages 78 and 49, Pap tests should be repeated every 2 years.   Beginning at age 43, you should have a Pap test every 3 years as long as the past 3 Pap tests have been normal.   Some women have medical problems that increase the chance of getting cervical cancer. Talk to your caregiver about these problems. It is especially important to talk to your caregiver if  a new problem develops soon after your last Pap test. In these cases, your caregiver may recommend more frequent screening and Pap tests.   The above recommendations are the same for women who have or have not gotten the vaccine for human papillomavirus (HPV).   If you had a hysterectomy for a problem that was not cancer or a condition that could lead to cancer, then you no longer need Pap tests. Even if you no longer need a Pap test, a regular exam is a good idea to make sure no other problems are starting.   If you are between ages 3 and 36, and you have had normal Pap tests going back 10 years, you no longer need Pap tests. Even if you no longer need a Pap test, a regular exam is a good idea to make sure no other problems are starting.   If you have had past treatment for cervical cancer or a condition that could lead to cancer, you need Pap tests and screening for cancer for at least 20 years after your  treatment.   If Pap tests have been discontinued, risk factors (such as a new sexual partner) need to be reassessed to determine if screening should be resumed.   The HPV test is an additional test that may be used for cervical cancer screening. The HPV test looks for the virus that can cause the cell changes on the cervix. The cells collected during the Pap test can be tested for HPV. The HPV test could be used to screen women aged 82 years and older, and should be used in women of any age who have unclear Pap test results. After the age of 55, women should have HPV testing at the same frequency as a Pap test.   Colorectal cancer can be detected and often prevented. Most routine colorectal cancer screening begins at the age of 26 and continues through age 8. However, your caregiver may recommend screening at an earlier age if you have risk factors for colon cancer. On a yearly basis, your caregiver may provide home test kits to check for hidden blood in the stool. Use of a small camera at the end of a tube, to directly examine the colon (sigmoidoscopy or colonoscopy), can detect the earliest forms of colorectal cancer. Talk to your caregiver about this at age 42, when routine screening begins. Direct examination of the colon should be repeated every 5 to 10 years through age 70, unless early forms of pre-cancerous polyps or small growths are found.   Hepatitis C blood testing is recommended for all people born from [birth year] through 1965 and any individual with known risks for hepatitis C.   Practice safe sex. Use condoms and avoid high-risk sexual practices to reduce the spread of sexually transmitted infections (STIs). STIs include gonorrhea, chlamydia, syphilis, trichomonas, herpes, HPV, and human immunodeficiency virus (HIV). Herpes, HIV, and HPV are viral illnesses that have no cure. They can result in disability, cancer, and death. Sexually active women aged 62 and younger should be checked for  chlamydia. Older women with new or multiple partners should also be tested for chlamydia. Testing for other STIs is recommended if you are sexually active and at increased risk.   Osteoporosis is a disease in which the bones lose minerals and strength with aging. This can result in serious bone fractures. The risk of osteoporosis can be identified using a bone density scan. Women ages 36 and over and women at risk for fractures or  osteoporosis should discuss screening with their caregivers. Ask your caregiver whether you should take a calcium supplement or vitamin D to reduce the rate of osteoporosis.   Menopause can be associated with physical symptoms and risks. Hormone replacement therapy is available to decrease symptoms and risks. You should talk to your caregiver about whether hormone replacement therapy is right for you.   Use sunscreen with sun protection factor (SPF) of 30 or more. Apply sunscreen liberally and repeatedly throughout the day. You should seek shade when your shadow is shorter than you. Protect yourself by wearing long sleeves, pants, a wide-brimmed hat, and sunglasses year round, whenever you are outdoors.   Once a month, do a whole body skin exam, using a mirror to look at the skin on your back. Notify your caregiver of new moles, moles that have irregular borders, moles that are larger than a pencil eraser, or moles that have changed in shape or color.   Stay current with required immunizations.   Influenza. You need a dose every fall (or winter). The composition of the flu vaccine changes each year, so being vaccinated once is not enough.   Pneumococcal polysaccharide. You need 1 to 2 doses if you smoke cigarettes or if you have certain chronic medical conditions. You need 1 dose at age 93 (or older) if you have never been vaccinated.   Tetanus, diphtheria, pertussis (Tdap, Td). Get 1 dose of Tdap vaccine if you are younger than age 59, are over 37 and have contact with an  infant, are a Research scientist (physical sciences), are pregnant, or simply want to be protected from whooping cough. After that, you need a Td booster dose every 10 years. Consult your caregiver if you have not had at least 3 tetanus and diphtheria-containing shots sometime in your life or have a deep or dirty wound.   HPV. You need this vaccine if you are a woman age 51 or younger. The vaccine is given in 3 doses over 6 months.   Measles, mumps, rubella (MMR). You need at least 1 dose of MMR if you were born in 1957 or later. You may also need a second dose.   Meningococcal. If you are age 32 to 33 and a first-year college student living in a residence hall, or have one of several medical conditions, you need to get vaccinated against meningococcal disease. You may also need additional booster doses.   Zoster (shingles). If you are age 53 or older, you should get this vaccine.   Varicella (chickenpox). If you have never had chickenpox or you were vaccinated but received only 1 dose, talk to your caregiver to find out if you need this vaccine.   Hepatitis A. You need this vaccine if you have a specific risk factor for hepatitis A virus infection or you simply wish to be protected from this disease. The vaccine is usually given as 2 doses, 6 to 18 months apart.   Hepatitis B. You need this vaccine if you have a specific risk factor for hepatitis B virus infection or you simply wish to be protected from this disease. The vaccine is given in 3 doses, usually over 6 months.  Preventive Services / Frequency Ages 27 to 58  Blood pressure check.** / Every 1 to 2 years.   Lipid and cholesterol check.** / Every 5 years beginning at age 83.   Clinical breast exam.** / Every 3 years for women in their 56s and 30s.   Pap test.** / Every 2 years  from ages 58 through 10. Every 3 years starting at age 37 through age 14 or 41 with a history of 3 consecutive normal Pap tests.   HPV screening.** / Every 3 years from ages 27  through ages 39 to 7 with a history of 3 consecutive normal Pap tests.   Hepatitis C blood test.** / For any individual with known risks for hepatitis C.   Skin self-exam. / Monthly.   Influenza immunization.** / Every year.   Pneumococcal polysaccharide immunization.** / 1 to 2 doses if you smoke cigarettes or if you have certain chronic medical conditions.   Tetanus, diphtheria, pertussis (Tdap, Td) immunization. / A one-time dose of Tdap vaccine. After that, you need a Td booster dose every 10 years.   HPV immunization. / 3 doses over 6 months, if you are 6 and younger.   Measles, mumps, rubella (MMR) immunization. / You need at least 1 dose of MMR if you were born in 1957 or later. You may also need a second dose.   Meningococcal immunization. / 1 dose if you are age 62 to 45 and a first-year college student living in a residence hall, or have one of several medical conditions, you need to get vaccinated against meningococcal disease. You may also need additional booster doses.   Varicella immunization.** / Consult your caregiver.   Hepatitis A immunization.** / Consult your caregiver. 2 doses, 6 to 18 months apart.   Hepatitis B immunization.** / Consult your caregiver. 3 doses usually over 6 months.  Ages 42 to 63  Blood pressure check.** / Every 1 to 2 years.   Lipid and cholesterol check.** / Every 5 years beginning at age 74.   Clinical breast exam.** / Every year after age 30.   Mammogram.** / Every year beginning at age 47 and continuing for as long as you are in good health. Consult with your caregiver.   Pap test.** / Every 3 years starting at age 21 through age 52 or 48 with a history of 3 consecutive normal Pap tests.   HPV screening.** / Every 3 years from ages 32 through ages 90 to 57 with a history of 3 consecutive normal Pap tests.   Fecal occult blood test (FOBT) of stool. / Every year beginning at age 40 and continuing until age 77. You may not need to do  this test if you get a colonoscopy every 10 years.   Flexible sigmoidoscopy or colonoscopy.** / Every 5 years for a flexible sigmoidoscopy or every 10 years for a colonoscopy beginning at age 70 and continuing until age 34.   Hepatitis C blood test.** / For all people born from [birth year] through 1965 and any individual with known risks for hepatitis C.   Skin self-exam. / Monthly.   Influenza immunization.** / Every year.   Pneumococcal polysaccharide immunization.** / 1 to 2 doses if you smoke cigarettes or if you have certain chronic medical conditions.   Tetanus, diphtheria, pertussis (Tdap, Td) immunization.** / A one-time dose of Tdap vaccine. After that, you need a Td booster dose every 10 years.   Measles, mumps, rubella (MMR) immunization. / You need at least 1 dose of MMR if you were born in 1957 or later. You may also need a second dose.   Varicella immunization.** / Consult your caregiver.   Meningococcal immunization.** / Consult your caregiver.   Hepatitis A immunization.** / Consult your caregiver. 2 doses, 6 to 18 months apart.   Hepatitis B immunization.** / Consult  your caregiver. 3 doses, usually over 6 months.  Ages 82 and over  Blood pressure check.** / Every 1 to 2 years.   Lipid and cholesterol check.** / Every 5 years beginning at age 15.   Clinical breast exam.** / Every year after age 20.   Mammogram.** / Every year beginning at age 30 and continuing for as long as you are in good health. Consult with your caregiver.   Pap test.** / Every 3 years starting at age 21 through age 62 or 55 with a 3 consecutive normal Pap tests. Testing can be stopped between 65 and 70 with 3 consecutive normal Pap tests and no abnormal Pap or HPV tests in the past 10 years.   HPV screening.** / Every 3 years from ages 74 through ages 27 or 1 with a history of 3 consecutive normal Pap tests. Testing can be stopped between 65 and 70 with 3 consecutive normal Pap tests and no abnormal  Pap or HPV tests in the past 10 years.   Fecal occult blood test (FOBT) of stool. / Every year beginning at age 59 and continuing until age 17. You may not need to do this test if you get a colonoscopy every 10 years.   Flexible sigmoidoscopy or colonoscopy.** / Every 5 years for a flexible sigmoidoscopy or every 10 years for a colonoscopy beginning at age 3 and continuing until age 51.   Hepatitis C blood test.** / For all people born from [birth year] through 1965 and any individual with known risks for hepatitis C.   Osteoporosis screening.** / A one-time screening for women ages 82 and over and women at risk for fractures or osteoporosis.   Skin self-exam. / Monthly.   Influenza immunization.** / Every year.   Pneumococcal polysaccharide immunization.** / 1 dose at age 36 (or older) if you have never been vaccinated.   Tetanus, diphtheria, pertussis (Tdap, Td) immunization. / A one-time dose of Tdap vaccine if you are over 65 and have contact with an infant, are a Research scientist (physical sciences), or simply want to be protected from whooping cough. After that, you need a Td booster dose every 10 years.   Varicella immunization.** / Consult your caregiver.   Meningococcal immunization.** / Consult your caregiver.   Hepatitis A immunization.** / Consult your caregiver. 2 doses, 6 to 18 months apart.   Hepatitis B immunization.** / Check with your caregiver. 3 doses, usually over 6 months.  ** Family history and personal history of risk and conditions may change your caregiver's recommendations. Document Released: 11/24/2001 Document Revised: 09/17/2011 Document Reviewed: 02/23/2011 [facility] Patient Information 2012 [place], [place].
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
## 2012-04-25 ENCOUNTER — Other Ambulatory Visit (HOSPITAL_COMMUNITY): Payer: Self-pay | Admitting: Internal Medicine

## 2012-05-30 ENCOUNTER — Encounter: Payer: Self-pay | Admitting: Internal Medicine

## 2012-05-30 ENCOUNTER — Ambulatory Visit (INDEPENDENT_AMBULATORY_CARE_PROVIDER_SITE_OTHER): Payer: PRIVATE HEALTH INSURANCE | Admitting: Internal Medicine

## 2012-05-30 VITALS — BP 132/74 | HR 60 | Ht 65.0 in | Wt 165.0 lb

## 2012-05-30 DIAGNOSIS — K589 Irritable bowel syndrome without diarrhea: Secondary | ICD-10-CM

## 2012-05-30 DIAGNOSIS — K219 Gastro-esophageal reflux disease without esophagitis: Secondary | ICD-10-CM

## 2012-05-30 MED ORDER — PANTOPRAZOLE SODIUM 40 MG PO TBEC: 40.0000 mg | DELAYED_RELEASE_TABLET | Freq: Every day | ORAL | Status: DC

## 2012-05-30 MED ORDER — ALOSETRON HCL 0.5 MG PO TABS: 0.5000 mg | ORAL_TABLET | Freq: Two times a day (BID) | ORAL | Status: DC

## 2012-05-30 NOTE — Patient Instructions (Addendum)
Today we have refilled your generic Protonix and given you a written Rx for Lotronex 0.5mg  to take to the pharmacy.  While on the Lotronex hold your dicyclomine.  If you get constipated or pain worsens stop the Lotronex and call us.  Thank you for choosing me and [facility].  Iva Boop, M.D., [facility]

## 2012-05-30 NOTE — Progress Notes (Signed)
  Subjective:    Patient ID: Sherri Murray, female    DOB: 1941/02/28, 71 y.o.   MRN: [id number]  HPI This patient returns in followup for IBS and GERD. She reports that she is much better on dicyclomine 20 mg before meals and diet changes. She has reduced roughage and spicy foods. She still has multiple bowel movements at times but does not seem to be having any fecal incontinence. If she overeats she will get significant crampy abdominal pain, especially when she is at a restaurant. Pantoprazole is doing a good job of controlling reflux symptoms, primarily a history of regurgitation without heartburn so much. She denies rectal bleeding. She still feels like she has frequent bowel movements and some crampy abdominal pain despite the dicyclomine 20 mg before meals. She is eating yogurt and feels like that helps as well. Medications, allergies, past medical history, past surgical history, family history and social history are reviewed and updated in the EMR.  Review of Systems She's having a lot of muscle aches and stiffness and would like to see a different rheumatologist. She think she probably has fibromyalgia like her sister does. She was found to have a cystocele at gynecology. Urinary incontinence is improved but still a problem at times. She believes she has had urodynamics performed at Dr. Wynn Maudlin office. She is having some hoarseness voice change and may be having sinus drainage, she is scheduled to see any her nose and throat physician. That is in [place]. She is at a calcium supplement, as well as a multivitamin.    Objective:   Physical Exam Elderly white woman looking younger than stated age in no acute distress.       Assessment & Plan:   1. IBS (irritable bowel syndrome)   Definitely improved on dicyclomine and dietary changes. However quality of life could be better it seems from talking to her.  Will hold dicyclomine and give Lotronex 0.5 mg twice a day a trial. Risks  benefits and indications a been explained previously, she has read the information sheet and signed that as well. She understands to stop if she gets severe constipation, bleeding, or severe, pain. She is to contact me. Otherwise we'll see her in 6 weeks.   2. GERD (gastroesophageal reflux disease)   Doing well on pantoprazole 40 mg daily. At this point will continue that. Medication is refilled.    I appreciate the opportunity to care for this patient.  CC: Genella Mech, MD and Assunta Gambles, MD

## 2012-05-31 ENCOUNTER — Telehealth: Payer: Self-pay

## 2012-05-31 NOTE — Telephone Encounter (Signed)
Notified [facility] in [place] at [phone] to let them know that I got prior authorization for her Lotronex 0.5mg , # 60 1 po BID, ok'ed thru 08/2012 prior authorization # [id number].

## 2012-06-06 ENCOUNTER — Ambulatory Visit (HOSPITAL_COMMUNITY)
Admission: RE | Admit: 2012-06-06 | Discharge: 2012-06-06 | Disposition: A | Discharge status: Home / Self Care | Payer: PRIVATE HEALTH INSURANCE | Source: Ambulatory Visit | Attending: Obstetrics & Gynecology | Admitting: Obstetrics & Gynecology

## 2012-06-06 DIAGNOSIS — Z1231 Encounter for screening mammogram for malignant neoplasm of breast: Secondary | ICD-10-CM | POA: Insufficient documentation

## 2012-07-02 ENCOUNTER — Other Ambulatory Visit: Payer: Self-pay | Admitting: Internal Medicine

## 2012-07-18 ENCOUNTER — Telehealth: Payer: Self-pay | Admitting: *Deleted

## 2012-07-18 NOTE — Telephone Encounter (Signed)
Received faxed request for prior authorization on pt's dicyclomine 20 mg tablet one TID before meals #90.  Authorization was approved for one year until 07/18/2013 as pt has a dx of IBS.  Will forward to PCP to make them aware. Phone call complete.Kingsley Spittle Cassady10/7/20132:00 PM  PA Authorization # [id number]

## 2012-07-19 ENCOUNTER — Ambulatory Visit: Payer: PRIVATE HEALTH INSURANCE | Admitting: Internal Medicine

## 2012-08-17 ENCOUNTER — Ambulatory Visit: Payer: PRIVATE HEALTH INSURANCE | Admitting: Internal Medicine

## 2012-09-13 ENCOUNTER — Encounter: Payer: Self-pay | Admitting: Unknown Physician Specialty

## 2012-09-15 ENCOUNTER — Ambulatory Visit: Payer: Self-pay | Admitting: Unknown Physician Specialty

## 2012-10-01 ENCOUNTER — Other Ambulatory Visit: Payer: Self-pay | Admitting: Internal Medicine

## 2012-10-03 ENCOUNTER — Telehealth: Payer: Self-pay

## 2012-10-03 NOTE — Telephone Encounter (Signed)
Successfully re-auth. Pts. Lotronex thru 03/2013 with Josh at # [phone] at [facility].  Contacted [facility] via phone and notified them# [phone].  Also faxed them a script with the sticker for Lotronex 0.5mg  tablets, #60, 1 RF, needs appointment before these run out. Pharm. Fax# [phone].

## 2012-10-10 ENCOUNTER — Telehealth: Payer: Self-pay | Admitting: Internal Medicine

## 2012-10-10 NOTE — Telephone Encounter (Signed)
Spoke to patient and told her the refill was done 10/03/12, she will go and pick up.  If any problems she will call back.

## 2012-10-12 ENCOUNTER — Encounter: Payer: Self-pay | Admitting: Unknown Physician Specialty

## 2012-12-01 ENCOUNTER — Other Ambulatory Visit: Payer: Self-pay | Admitting: Internal Medicine

## 2012-12-20 ENCOUNTER — Other Ambulatory Visit: Payer: Self-pay | Admitting: Internal Medicine

## 2013-02-10 ENCOUNTER — Other Ambulatory Visit: Payer: Self-pay | Admitting: Internal Medicine

## 2013-02-14 NOTE — Telephone Encounter (Signed)
Left message with a friend at her house to call back and make an appointment to see Dr. Leone Payor.

## 2013-02-14 NOTE — Telephone Encounter (Signed)
We will contact her (PJ - please do so) and schedule an REV

## 2013-03-10 ENCOUNTER — Encounter: Payer: Self-pay | Admitting: Internal Medicine

## 2013-03-10 ENCOUNTER — Ambulatory Visit (INDEPENDENT_AMBULATORY_CARE_PROVIDER_SITE_OTHER): Payer: PRIVATE HEALTH INSURANCE | Admitting: Internal Medicine

## 2013-03-10 VITALS — BP 138/84 | HR 88 | Ht 65.0 in | Wt 172.8 lb

## 2013-03-10 DIAGNOSIS — K589 Irritable bowel syndrome without diarrhea: Secondary | ICD-10-CM

## 2013-03-10 DIAGNOSIS — K219 Gastro-esophageal reflux disease without esophagitis: Secondary | ICD-10-CM

## 2013-03-10 MED ORDER — ALOSETRON HCL 0.5 MG PO TABS: 0.5000 mg | ORAL_TABLET | Freq: Two times a day (BID) | ORAL | Status: DC

## 2013-03-10 NOTE — Progress Notes (Signed)
  Subjective:    Patient ID: Sherri Murray, female    DOB: 1941-05-18, 72 y.o.   MRN: [id number]  HPI this pleasant elderly woman is here for followup of IBS. She started Lotronex last year. Doing better w/ less LLQ pain and loose stools but only took Loronex 0.5 mg qd. Watches diet also. Medications, allergies, past medical history, past surgical history, family history and social history are reviewed and updated in the EMR. Review of Systems Osteoarthritis pain - wants to see a [place] specialist, wants to switch primary care Also wants to see an endocrinologist - I had thyroid surgery years ago and was told I would need medicine forever but they aren't giving me any    Objective:   Physical Exam WDWN NAD Abdomen soft, NT BS + Ext - PIP nodules on fingers w/ some deformity    Assessment & Plan:   1. IBS (irritable bowel syndrome)   She is improved but not on maximal therapy. She will go to twice a day on 0.5 mg Lotronex. If this is not helping enough she can be changed to 1 mg twice a day and she is told to call back if the case. Otherwise I will see her in a year.   2. GERD (gastroesophageal reflux disease)   Continue pantoprazole 40 mg daily, she is under control      Medication List                      TAKE these medications       alosetron 0.5 MG tablet  Commonly known as:  LOTRONEX  Take 1 tablet (0.5 mg total) by mouth 2 (two) times daily.  Changed by:  Iva Boop, MD     BEPREVE 1.5 % Soln  Generic drug:  Bepotastine Besilate     cetirizine 10 MG tablet  Commonly known as:  ZYRTEC  Take 10 mg by mouth daily.     cyclobenzaprine 10 MG tablet  Commonly known as:  FLEXERIL  Take 10 mg by mouth at bedtime.     Magnesium 500 MG Caps  Take 1 capsule by mouth daily.     meloxicam 7.5 MG tablet  Commonly known as:  MOBIC  Take 7.5 mg by mouth daily.     mirabegron ER 50 MG Tb24  Commonly known as:  MYRBETRIQ  Take 50 mg by mouth daily.     mometasone 50 MCG/ACT nasal spray  Commonly known as:  NASONEX  Place 2 sprays into the nose daily.     montelukast 10 MG tablet  Commonly known as:  SINGULAIR  Take 10 mg by mouth at bedtime.     multivitamin with minerals Tabs  Take 1 tablet by mouth daily.     pantoprazole 40 MG tablet  Commonly known as:  PROTONIX  Take 1 tablet (40 mg total) by mouth daily. 30-60 minutes before breakfast     PROAIR HFA 108 (90 BASE) MCG/ACT inhaler  Generic drug:  albuterol     TGT CALCIUM DIETARY SUPPLEMENT PO  Take 1 tablet by mouth daily.

## 2013-03-10 NOTE — Patient Instructions (Addendum)
Today we are providing you with a written rx for Lotronex to take to your pharmacy.  The directions are being changed to twice a day.  If you don't see improvement in a couple of months call us back to see if the dosage needs to be adjusted.   The # for [place] at [facility] is # [phone].   Follow up with [place] in a year.   I appreciate the opportunity to care for you.

## 2013-04-15 ENCOUNTER — Other Ambulatory Visit: Payer: Self-pay | Admitting: Internal Medicine

## 2013-04-17 NOTE — Telephone Encounter (Signed)
I will deny this refill and suggest that her GI doctor fill it as this seems to be the only doctor she sees. If she wants any refills from our clinic she needs to be seen as it has been more than 1 year and I have never met the patient.   Thanks,  Dr. Dorise Hiss

## 2013-04-21 ENCOUNTER — Ambulatory Visit (INDEPENDENT_AMBULATORY_CARE_PROVIDER_SITE_OTHER): Payer: PRIVATE HEALTH INSURANCE | Admitting: Internal Medicine

## 2013-04-21 ENCOUNTER — Encounter: Payer: Self-pay | Admitting: Internal Medicine

## 2013-04-21 VITALS — BP 120/70 | HR 60 | Temp 97.5°F | Ht 65.5 in | Wt 171.9 lb

## 2013-04-21 DIAGNOSIS — R32 Unspecified urinary incontinence: Secondary | ICD-10-CM

## 2013-04-21 DIAGNOSIS — R5383 Other fatigue: Secondary | ICD-10-CM

## 2013-04-21 DIAGNOSIS — D649 Anemia, unspecified: Secondary | ICD-10-CM

## 2013-04-21 DIAGNOSIS — K219 Gastro-esophageal reflux disease without esophagitis: Secondary | ICD-10-CM

## 2013-04-21 DIAGNOSIS — R5381 Other malaise: Secondary | ICD-10-CM

## 2013-04-21 DIAGNOSIS — Z13820 Encounter for screening for osteoporosis: Secondary | ICD-10-CM | POA: Insufficient documentation

## 2013-04-21 DIAGNOSIS — K589 Irritable bowel syndrome without diarrhea: Secondary | ICD-10-CM

## 2013-04-21 LAB — CBC WITH DIFFERENTIAL/PLATELET
Eosinophils Absolute: 0.2 10*3/uL (ref 0.0–0.7)
Eosinophils Relative: 2 % (ref 0–5)
HCT: 37.3 % (ref 36.0–46.0)
Lymphocytes Relative: 42 % (ref 12–46)
Lymphs Abs: 2.8 10*3/uL (ref 0.7–4.0)
MCH: 30.3 pg (ref 26.0–34.0)
MCV: 86.9 fL (ref 78.0–100.0)
Monocytes Absolute: 0.5 10*3/uL (ref 0.1–1.0)
Monocytes Relative: 7 % (ref 3–12)
Platelets: 272 10*3/uL (ref 150–400)
RBC: 4.29 MIL/uL (ref 3.87–5.11)
WBC: 6.6 10*3/uL (ref 4.0–10.5)

## 2013-04-21 LAB — COMPREHENSIVE METABOLIC PANEL
ALT: 12 U/L (ref 0–35)
BUN: 20 mg/dL (ref 6–23)
CO2: 24 mEq/L (ref 19–32)
Calcium: 9.3 mg/dL (ref 8.4–10.5)
Chloride: 104 mEq/L (ref 96–112)
Creat: 0.83 mg/dL (ref 0.50–1.10)
Glucose, Bld: 97 mg/dL (ref 70–99)
Total Bilirubin: 0.4 mg/dL (ref 0.3–1.2)

## 2013-04-21 LAB — TSH: TSH: 3.325 u[IU]/mL (ref 0.350–4.500)

## 2013-04-21 LAB — MAGNESIUM: Magnesium: 2 mg/dL (ref 1.5–2.5)

## 2013-04-21 MED ORDER — PANTOPRAZOLE SODIUM 40 MG PO TBEC: 40.0000 mg | DELAYED_RELEASE_TABLET | Freq: Every day | ORAL | Status: DC

## 2013-04-21 NOTE — Assessment & Plan Note (Signed)
Claims to follow with GYN who she needs to go back to and will schedule an appointment in [place], possibly at [facility] clinic.  Denies dysuria or hematuria or frequency and urgency but has baseline incontinence.   -currently on Myrbetriq but says may need to change and will discuss with obgyn

## 2013-04-21 NOTE — Assessment & Plan Note (Signed)
Reports diffuse body aches.  Takes vitamin d 1000iu supplements but no calcium at this time.  No hx of prior dexa scan.   -dexa scan today -recommended to start calcium supplementation ~1200mg /day along with vitamin d

## 2013-04-21 NOTE — Progress Notes (Signed)
Case discussed with Dr. Qureshi at the time of the visit.  We reviewed the resident's history and exam and pertinent patient test results.  I agree with the assessment, diagnosis, and plan of care documented in the resident's note. 

## 2013-04-21 NOTE — Patient Instructions (Signed)
General Instructions: Please follow up with your dexa scan and your other appointments with GI and podiatry  Your appointment with [facility] has been made with Dr. Ermalene Searing on September 28, 2013 at 830am.  They will mail you a welcome packet for you to send back.  Continue to follow your diet as recommended by GI  It was nice to meet you  Treatment Goals:  Goals (1 Years of Data) as of 04/21/13   None      Progress Toward Treatment Goals:  Treatment Goal 04/21/2013  Prevent falls at goal    Self Care Goals & Plans:  Self Care Goal 04/21/2013  Manage my medications take my medicines as prescribed    Care Management & Community Referrals:
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
## 2013-04-21 NOTE — Assessment & Plan Note (Signed)
Follows closely with Dr. Leone Payor.    -currently on Lotronex and reports improvement in symptoms -refilled PPI -she monitors her diet closely

## 2013-04-21 NOTE — Assessment & Plan Note (Signed)
Last Hb 03/2012 12.1.  Hx of ischemic colitis and IBS.  Denies recent blood in stool or urine.  Does complain of feeling tired often.    -recheck cbc today and tsh

## 2013-06-01 ENCOUNTER — Other Ambulatory Visit: Payer: Self-pay

## 2013-06-01 DIAGNOSIS — K219 Gastro-esophageal reflux disease without esophagitis: Secondary | ICD-10-CM

## 2013-06-01 MED ORDER — PANTOPRAZOLE SODIUM 40 MG PO TBEC: 40.0000 mg | DELAYED_RELEASE_TABLET | Freq: Every day | ORAL | Status: DC

## 2013-06-21 ENCOUNTER — Other Ambulatory Visit: Payer: Self-pay | Admitting: Obstetrics & Gynecology

## 2013-06-21 DIAGNOSIS — Z1231 Encounter for screening mammogram for malignant neoplasm of breast: Secondary | ICD-10-CM

## 2013-06-29 ENCOUNTER — Telehealth: Payer: Self-pay | Admitting: Internal Medicine

## 2013-06-29 ENCOUNTER — Ambulatory Visit (HOSPITAL_COMMUNITY)
Admission: RE | Admit: 2013-06-29 | Discharge: 2013-06-29 | Disposition: A | Discharge status: Home / Self Care | Payer: PRIVATE HEALTH INSURANCE | Source: Ambulatory Visit | Attending: Obstetrics & Gynecology | Admitting: Obstetrics & Gynecology

## 2013-06-29 DIAGNOSIS — Z1231 Encounter for screening mammogram for malignant neoplasm of breast: Secondary | ICD-10-CM

## 2013-06-29 NOTE — Telephone Encounter (Signed)
All questions answered about lotronex

## 2013-09-28 ENCOUNTER — Encounter: Payer: Self-pay | Admitting: Family Medicine

## 2013-09-28 ENCOUNTER — Ambulatory Visit (INDEPENDENT_AMBULATORY_CARE_PROVIDER_SITE_OTHER): Payer: PRIVATE HEALTH INSURANCE | Admitting: Family Medicine

## 2013-09-28 VITALS — BP 150/76 | HR 59 | Temp 97.8°F | Ht 63.75 in | Wt 171.5 lb

## 2013-09-28 DIAGNOSIS — IMO0001 Reserved for inherently not codable concepts without codable children: Secondary | ICD-10-CM

## 2013-09-28 DIAGNOSIS — R5381 Other malaise: Secondary | ICD-10-CM

## 2013-09-28 DIAGNOSIS — K559 Vascular disorder of intestine, unspecified: Secondary | ICD-10-CM | POA: Insufficient documentation

## 2013-09-28 DIAGNOSIS — I83893 Varicose veins of bilateral lower extremities with other complications: Secondary | ICD-10-CM | POA: Insufficient documentation

## 2013-09-28 DIAGNOSIS — Z8669 Personal history of other diseases of the nervous system and sense organs: Secondary | ICD-10-CM

## 2013-09-28 DIAGNOSIS — I83813 Varicose veins of bilateral lower extremities with pain: Secondary | ICD-10-CM | POA: Insufficient documentation

## 2013-09-28 DIAGNOSIS — E890 Postprocedural hypothyroidism: Secondary | ICD-10-CM

## 2013-09-28 DIAGNOSIS — M2550 Pain in unspecified joint: Secondary | ICD-10-CM

## 2013-09-28 DIAGNOSIS — R5383 Other fatigue: Secondary | ICD-10-CM | POA: Insufficient documentation

## 2013-09-28 DIAGNOSIS — J4520 Mild intermittent asthma, uncomplicated: Secondary | ICD-10-CM | POA: Insufficient documentation

## 2013-09-28 DIAGNOSIS — J309 Allergic rhinitis, unspecified: Secondary | ICD-10-CM

## 2013-09-28 DIAGNOSIS — K219 Gastro-esophageal reflux disease without esophagitis: Secondary | ICD-10-CM | POA: Insufficient documentation

## 2013-09-28 DIAGNOSIS — R32 Unspecified urinary incontinence: Secondary | ICD-10-CM

## 2013-09-28 DIAGNOSIS — J45909 Unspecified asthma, uncomplicated: Secondary | ICD-10-CM

## 2013-09-28 LAB — COMPREHENSIVE METABOLIC PANEL
Albumin: 4.5 g/dL (ref 3.5–5.2)
Alkaline Phosphatase: 59 U/L (ref 39–117)
CO2: 27 mEq/L (ref 19–32)
Calcium: 9.4 mg/dL (ref 8.4–10.5)
Chloride: 106 mEq/L (ref 96–112)
GFR: 79.19 mL/min (ref >60.00)
Glucose, Bld: 92 mg/dL (ref 70–99)
Potassium: 4.3 mEq/L (ref 3.5–5.1)
Sodium: 139 mEq/L (ref 135–145)
Total Protein: 7.5 g/dL (ref 6.0–8.3)

## 2013-09-28 LAB — CBC WITH DIFFERENTIAL/PLATELET
Eosinophils Relative: 1.6 % (ref 0.0–5.0)
MCV: 90.9 fl (ref 78.0–100.0)
Monocytes Absolute: 1 10*3/uL (ref 0.1–1.0)
Neutrophils Relative %: 50.8 % (ref 43.0–77.0)
Platelets: 272 10*3/uL (ref 150.0–400.0)
WBC: 9.6 10*3/uL (ref 4.5–10.5)

## 2013-09-28 NOTE — Assessment & Plan Note (Signed)
In past on thyroid medications. She is concerned she may be low in thyroid given weight gain, hair loss.

## 2013-09-28 NOTE — Progress Notes (Signed)
Pre-visit discussion using our clinic review tool. No additional management support is needed unless otherwise documented below in the visit note.  

## 2013-09-28 NOTE — Assessment & Plan Note (Signed)
Eval with labs. 

## 2013-09-28 NOTE — Assessment & Plan Note (Signed)
Inadequate on Mybetriq.

## 2013-09-28 NOTE — Assessment & Plan Note (Signed)
She is taking too many NSAIDs... Will have limit.

## 2013-09-28 NOTE — Patient Instructions (Addendum)
Stop meloxicam and diclofenac as in same family as naprosyn.  Can use naprosyn (aleve) for antinflammatory.  Can use tramadol for pain. Call GYN to set up urodynamic studies or further eval of urge incontinence.. Dr. Macon Large. Return for fasting labs when able.  Follow up 30 min multiple issues in 1 month. Stop at front desk for referral to vascular MD.

## 2013-09-28 NOTE — Assessment & Plan Note (Signed)
Will refer to vascular MD for further eval and treat of severe painful varicose veins.

## 2013-09-28 NOTE — Assessment & Plan Note (Signed)
Stable control on protonix.

## 2013-09-28 NOTE — Progress Notes (Signed)
   Subjective:    Patient ID: Sherri Murray, female    DOB: 09-21-1941, 72 y.o.   MRN: [id number]  HPI  72 year old female presents to establish. Changing MDs to  Keep all MDs in [place].  Previous MD at [facility].  She was also concerned her PCP was missing diagnosis by not testing.   She has been having issues with arthritis.  Followed by Dr. Gavin Potters. Has follow up soon.  Sister with fibromyalgia. Very fatigued at times, whole body ache. Good days and bad days.  Has meloxicam, tramadol, naprosyn and diclofenac listed as taking but she is not sure.  Sh is definitely using a lot of naprosyn.   She has varicose veins both legs, painful consistently.  Requesting referral to vascular MD for this issue.   Recent hospitalization May 2014... For blood in stool. Dx with ischemic colitits and IBS Followed by Dr. Leone Payor. Mother with colon cancer. Colonscopy 5/2 103 showed colitis. CBC, CMET nml in follow up.   In past on thyroid medications. She is concerned she may be low in thyroid given weight gain, hair loss. She is also concerned about hormones being los/ messed up given night sweats, etc.  Had CPX in 2013 with Dr. Macon Large.  Review of Systems     Objective:   Physical Exam  Constitutional: Vital signs are normal. She appears well-developed and well-nourished. She is cooperative.  Non-toxic appearance. She does not appear ill. No distress.  HENT:  Head: Normocephalic.  Right Ear: Hearing, tympanic membrane, external ear and ear canal normal.  Left Ear: Hearing, tympanic membrane, external ear and ear canal normal.  Nose: Nose normal.  Eyes: Conjunctivae, EOM and lids are normal. Pupils are equal, round, and reactive to light. Lids are everted and swept, no foreign bodies found.  Neck: Trachea normal and normal range of motion. Neck supple. Carotid bruit is not present. No mass and no thyromegaly present.  Cardiovascular: Normal rate, regular rhythm, S1  normal, S2 normal, normal heart sounds and intact distal pulses.  Exam reveals no gallop.   No murmur heard. No peripheral edema, B extensive varicose veins up to thighs B  Pulmonary/Chest: Effort normal and breath sounds normal. No respiratory distress. She has no wheezes. She has no rhonchi. She has no rales.  Abdominal: Soft. Normal appearance and bowel sounds are normal. She exhibits no distension, no fluid wave, no abdominal bruit and no mass. There is no hepatosplenomegaly. There is no tenderness. There is no rebound, no guarding and no CVA tenderness. No hernia.  Lymphadenopathy:    She has no cervical adenopathy.    She has no axillary adenopathy.  Neurological: She is alert. She has normal strength. No cranial nerve deficit or sensory deficit.  Skin: Skin is warm, dry and intact. No rash noted.  Psychiatric: She has a normal mood and affect. Her behavior is normal. Judgment normal. Her mood appears not anxious. Her speech is tangential. Cognition and memory are normal. She does not exhibit a depressed mood.          Assessment & Plan:

## 2013-09-28 NOTE — Assessment & Plan Note (Signed)
She will discuss with her rheumatologist.  Needs further eval.? Dx possiblity of fibromyalgia, although today only 4 trigger points. ( She says today is a good day)

## 2013-09-29 LAB — VITAMIN D 25 HYDROXY (VIT D DEFICIENCY, FRACTURES): Vit D, 25-Hydroxy: 41 ng/mL (ref 30–89)

## 2013-10-09 ENCOUNTER — Telehealth: Payer: Self-pay

## 2013-10-09 NOTE — Telephone Encounter (Signed)
This does not sound like the flu... Usually quick onset high fever, body aches (Hit by a mack truck feeling) i would recommend eval to make sure no bacterial infection and for flu test given wheeze and chest symptoms.  Unless direct household exposure to diagnosed flu pt.. I don't recommend tamiflu without being seen.

## 2013-10-09 NOTE — Telephone Encounter (Signed)
Pt left v/m; pt got flu shot at [facility] in 07/2013 and pt thinks she has flu now, pt request tamiflu. Pt said week before Christmas pt began getting sick and has been in bed since 10/05/13. Pt has chest congestion and wheezing with non prod cough; pt has taken several things OTC for cough. No fever and most of generalized aching has stopped. Pt request tamiflu to [facility]. Pt said there is no way she can get dressed and come to office for appt.Please advise.

## 2013-10-10 NOTE — Telephone Encounter (Signed)
Patient notified as instructed by telephone.  Appointment scheduled with Dr. Ermalene Searing 10/11/2013 @ 12:45pm.

## 2013-10-11 ENCOUNTER — Ambulatory Visit (INDEPENDENT_AMBULATORY_CARE_PROVIDER_SITE_OTHER): Payer: 59 | Admitting: Family Medicine

## 2013-10-11 ENCOUNTER — Encounter: Payer: Self-pay | Admitting: Family Medicine

## 2013-10-11 VITALS — BP 122/64 | HR 60 | Temp 97.8°F | Wt 169.5 lb

## 2013-10-11 DIAGNOSIS — J45901 Unspecified asthma with (acute) exacerbation: Secondary | ICD-10-CM

## 2013-10-11 DIAGNOSIS — R05 Cough: Secondary | ICD-10-CM

## 2013-10-11 DIAGNOSIS — R059 Cough, unspecified: Secondary | ICD-10-CM

## 2013-10-11 LAB — POCT INFLUENZA A/B: Influenza A, POC: NEGATIVE

## 2013-10-11 MED ORDER — PREDNISONE 10 MG PO TABS: ORAL_TABLET | ORAL | Status: DC

## 2013-10-11 NOTE — Assessment & Plan Note (Signed)
Viral trigger. Peak flow best 250. Treat with steroid taper.  Follow up if not improving.

## 2013-10-11 NOTE — Progress Notes (Signed)
   Subjective:    Patient ID: Sherri Murray, female    DOB: 11-11-1940, 72 y.o.   MRN: [id number]  Cough This is a new problem. The current episode started in the past 7 days. The problem has been gradually worsening. The problem occurs constantly. The cough is non-productive. Associated symptoms include chills, myalgias, nasal congestion, sweats and wheezing. Pertinent negatives include no ear congestion, ear pain, fever, headaches, hemoptysis, postnasal drip, rash, rhinorrhea or shortness of breath. Associated symptoms comments: Sudden onset  right sided back pain. The symptoms are aggravated by lying down (nonsmoker). Risk factors: nonsmoker, sick contacts. Treatments tried: occ needing albuterol at night. tried coricindin, triaminic. Her past medical history is significant for asthma and environmental allergies. There is no history of COPD or emphysema.      Review of Systems  Constitutional: Positive for chills. Negative for fever.  HENT: Negative for ear pain, postnasal drip and rhinorrhea.   Respiratory: Positive for cough and wheezing. Negative for hemoptysis and shortness of breath.   Musculoskeletal: Positive for myalgias.  Skin: Negative for rash.  Allergic/Immunologic: Positive for environmental allergies.  Neurological: Negative for headaches.       Objective:   Physical Exam  Constitutional: Vital signs are normal. She appears well-developed and well-nourished. She is cooperative.  Non-toxic appearance. She does not appear ill. No distress.  HENT:  Head: Normocephalic.  Right Ear: Hearing, tympanic membrane, external ear and ear canal normal. Tympanic membrane is not erythematous, not retracted and not bulging.  Left Ear: Hearing, tympanic membrane, external ear and ear canal normal. Tympanic membrane is not erythematous, not retracted and not bulging.  Nose: Mucosal edema and rhinorrhea present. Right sinus exhibits no maxillary sinus tenderness and no frontal sinus  tenderness. Left sinus exhibits no maxillary sinus tenderness and no frontal sinus tenderness.  Mouth/Throat: Uvula is midline, oropharynx is clear and moist and mucous membranes are normal.  Eyes: Conjunctivae, EOM and lids are normal. Pupils are equal, round, and reactive to light. Lids are everted and swept, no foreign bodies found.  Neck: Trachea normal and normal range of motion. Neck supple. Carotid bruit is not present. No mass and no thyromegaly present.  Cardiovascular: Normal rate, regular rhythm, S1 normal, S2 normal, normal heart sounds, intact distal pulses and normal pulses.  Exam reveals no gallop and no friction rub.   No murmur heard. Pulmonary/Chest: Effort normal and breath sounds normal. Not tachypneic. No respiratory distress. She has no decreased breath sounds. She has no wheezes. She has no rhonchi. She has no rales.  Neurological: She is alert.  Skin: Skin is warm, dry and intact. No rash noted.  Psychiatric: Her speech is normal and behavior is normal. Judgment normal. Her mood appears not anxious. Cognition and memory are normal. She does not exhibit a depressed mood.          Assessment & Plan:

## 2013-10-11 NOTE — Progress Notes (Signed)
Pre-visit discussion using our clinic review tool. No additional management support is needed unless otherwise documented below in the visit note.  

## 2013-10-11 NOTE — Patient Instructions (Addendum)
Viral infection triggering asthma. Start prednisone taper. Use mucinex or delsym for cough. Use albuterol ever 3-6 hours as needed for wheeze.  Go To ER if severe shortness of breath. Call if not improving as expected.

## 2013-10-31 ENCOUNTER — Ambulatory Visit (INDEPENDENT_AMBULATORY_CARE_PROVIDER_SITE_OTHER): Payer: 59 | Admitting: Family Medicine

## 2013-10-31 ENCOUNTER — Encounter: Payer: Self-pay | Admitting: Family Medicine

## 2013-10-31 VITALS — BP 158/84 | HR 54 | Temp 97.4°F | Ht 63.75 in | Wt 171.0 lb

## 2013-10-31 DIAGNOSIS — J45901 Unspecified asthma with (acute) exacerbation: Secondary | ICD-10-CM

## 2013-10-31 DIAGNOSIS — K219 Gastro-esophageal reflux disease without esophagitis: Secondary | ICD-10-CM

## 2013-10-31 DIAGNOSIS — E890 Postprocedural hypothyroidism: Secondary | ICD-10-CM

## 2013-10-31 DIAGNOSIS — IMO0001 Reserved for inherently not codable concepts without codable children: Secondary | ICD-10-CM

## 2013-10-31 DIAGNOSIS — R5383 Other fatigue: Secondary | ICD-10-CM

## 2013-10-31 DIAGNOSIS — R5381 Other malaise: Secondary | ICD-10-CM

## 2013-10-31 NOTE — Assessment & Plan Note (Signed)
Lab eval nml.

## 2013-10-31 NOTE — Progress Notes (Signed)
   Subjective:    Patient ID: Sherri Murray, female    DOB: 1941/08/28, 73 y.o.   MRN: [id number]  HPI  73 year old female presents for 1 onth follow up for multiple issues noted at her new pt OV in early 09/2013.   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  Saw last week.  Recommended treatment with ? Surgery, sclerotherapy?  Awaiting insurance approval.  Asthma, exacerbation: treated with pred taper in mid December, viral trigger. She felt fabulous on prednisone , wants to stay on this forever! She changed her mind after discussing SE.  still occ cough, mild chest tightness at times.  GERD, worsened on prednisone. On protonix.  Has upcoming aptp with [facility], Dr. Jacqlyn Larsen as mybetriq is not helping.   Arthralgis, ? Fibromyalgia: Has appt for work up with Dr. Jefm Bryant.  Allergic rhinitis: Has appt  For yearly check with allergist.     Review of Systems  Constitutional: Positive for fatigue. Negative for fever.  HENT: Negative for ear pain.   Eyes: Negative for pain.  Respiratory: Negative for chest tightness and shortness of breath.   Cardiovascular: Negative for chest pain, palpitations and leg swelling.  Gastrointestinal: Negative for abdominal pain.  Genitourinary: Negative for dysuria.  Musculoskeletal: Positive for arthralgias and myalgias.       Objective:   Physical Exam  Constitutional: She is oriented to person, place, and time. Vital signs are normal. She appears well-developed and well-nourished. She is cooperative.  Non-toxic appearance. She does not appear ill. No distress.  HENT:  Head: Normocephalic.  Right Ear: Hearing, tympanic membrane, external ear and ear canal normal. Tympanic membrane is not erythematous, not retracted and not bulging.  Left Ear: Hearing, tympanic membrane, external ear and ear canal normal. Tympanic membrane is not erythematous, not retracted and not bulging.  Nose: No mucosal edema or rhinorrhea. Right sinus exhibits no  maxillary sinus tenderness and no frontal sinus tenderness. Left sinus exhibits no maxillary sinus tenderness and no frontal sinus tenderness.  Mouth/Throat: Uvula is midline, oropharynx is clear and moist and mucous membranes are normal.  Eyes: Conjunctivae, EOM and lids are normal. Pupils are equal, round, and reactive to light. Lids are everted and swept, no foreign bodies found.  Neck: Trachea normal and normal range of motion. Neck supple. Carotid bruit is not present. No mass and no thyromegaly present.  Cardiovascular: Normal rate, regular rhythm, S1 normal, S2 normal, normal heart sounds, intact distal pulses and normal pulses.  Exam reveals no gallop and no friction rub.   No murmur heard. Pulmonary/Chest: Effort normal and breath sounds normal. Not tachypneic. No respiratory distress. She has no decreased breath sounds. She has no wheezes. She has no rhonchi. She has no rales.  Abdominal: Soft. Normal appearance and bowel sounds are normal. There is no tenderness.  Neurological: She is alert and oriented to person, place, and time.  Skin: Skin is warm, dry and intact. No rash noted.  Psychiatric: Her speech is normal and behavior is normal. Judgment and thought content normal. Her mood appears not anxious. Cognition and memory are normal. She does not exhibit a depressed mood.          Assessment & Plan:

## 2013-10-31 NOTE — Progress Notes (Signed)
Pre-visit discussion using our clinic review tool. No additional management support is needed unless otherwise documented below in the visit note.  

## 2013-10-31 NOTE — Assessment & Plan Note (Signed)
Thyrpoid function test normal on no med.

## 2013-10-31 NOTE — Assessment & Plan Note (Signed)
Resolved. HAs follow up with allergist, Dr. Ishmael Holter.

## 2013-10-31 NOTE — Assessment & Plan Note (Signed)
Inadequate control on protonix. Reviewed triggers... Pt overdoing juice, citrus and tomatoes.

## 2013-10-31 NOTE — Patient Instructions (Addendum)
Decrease reflux triggers:  Citris, spicy, caffeine, tomatos, alcohol. Stop juice. Return for fasting cholesterol when able. Follow up in 6 months for medicare wellness with fasting labs prior.

## 2013-10-31 NOTE — Assessment & Plan Note (Signed)
Eval pending with Dr. Jefm Bryant.

## 2013-11-02 ENCOUNTER — Other Ambulatory Visit (INDEPENDENT_AMBULATORY_CARE_PROVIDER_SITE_OTHER): Payer: 59

## 2013-11-02 DIAGNOSIS — Z136 Encounter for screening for cardiovascular disorders: Secondary | ICD-10-CM

## 2013-11-02 DIAGNOSIS — R5381 Other malaise: Secondary | ICD-10-CM

## 2013-11-02 DIAGNOSIS — R5383 Other fatigue: Principal | ICD-10-CM

## 2013-11-02 DIAGNOSIS — IMO0001 Reserved for inherently not codable concepts without codable children: Secondary | ICD-10-CM

## 2013-11-02 LAB — VITAMIN B12: Vitamin B-12: 710 pg/mL (ref 211–911)

## 2013-11-02 LAB — LDL CHOLESTEROL, DIRECT: Direct LDL: 136.1 mg/dL

## 2013-11-02 LAB — LIPID PANEL
CHOL/HDL RATIO: 3
CHOLESTEROL: 218 mg/dL — AB (ref 0–200)
HDL: 71.7 mg/dL (ref >39.00)
Triglycerides: 70 mg/dL (ref 0.0–149.0)
VLDL: 14 mg/dL (ref 0.0–40.0)

## 2013-11-03 ENCOUNTER — Encounter: Payer: Self-pay | Admitting: Family Medicine

## 2013-11-30 ENCOUNTER — Ambulatory Visit (HOSPITAL_COMMUNITY): Payer: 59

## 2013-12-08 ENCOUNTER — Ambulatory Visit (HOSPITAL_COMMUNITY): Payer: 59

## 2013-12-14 ENCOUNTER — Ambulatory Visit (HOSPITAL_COMMUNITY): Admission: RE | Admit: 2013-12-14 | Payer: 59 | Source: Ambulatory Visit

## 2014-03-12 ENCOUNTER — Other Ambulatory Visit: Payer: Self-pay | Admitting: Internal Medicine

## 2014-03-12 NOTE — Telephone Encounter (Signed)
OK to refill x 3 Needs REV

## 2014-03-12 NOTE — Telephone Encounter (Signed)
Please advise regarding refill Sir, thank you. 

## 2014-03-26 DIAGNOSIS — M1990 Unspecified osteoarthritis, unspecified site: Secondary | ICD-10-CM | POA: Insufficient documentation

## 2014-04-16 ENCOUNTER — Encounter (HOSPITAL_COMMUNITY): Payer: Self-pay | Admitting: Emergency Medicine

## 2014-04-16 ENCOUNTER — Emergency Department (HOSPITAL_COMMUNITY)
Admission: EM | Admit: 2014-04-16 | Discharge: 2014-04-16 | Disposition: A | Discharge status: Home / Self Care | Payer: Medicare HMO | Attending: Emergency Medicine | Admitting: Emergency Medicine

## 2014-04-16 DIAGNOSIS — L738 Other specified follicular disorders: Secondary | ICD-10-CM | POA: Diagnosis not present

## 2014-04-16 DIAGNOSIS — Z8639 Personal history of other endocrine, nutritional and metabolic disease: Secondary | ICD-10-CM | POA: Insufficient documentation

## 2014-04-16 DIAGNOSIS — M129 Arthropathy, unspecified: Secondary | ICD-10-CM | POA: Insufficient documentation

## 2014-04-16 DIAGNOSIS — Z862 Personal history of diseases of the blood and blood-forming organs and certain disorders involving the immune mechanism: Secondary | ICD-10-CM | POA: Diagnosis not present

## 2014-04-16 DIAGNOSIS — L678 Other hair color and hair shaft abnormalities: Secondary | ICD-10-CM | POA: Diagnosis not present

## 2014-04-16 DIAGNOSIS — Z79899 Other long term (current) drug therapy: Secondary | ICD-10-CM | POA: Diagnosis not present

## 2014-04-16 DIAGNOSIS — K219 Gastro-esophageal reflux disease without esophagitis: Secondary | ICD-10-CM | POA: Diagnosis not present

## 2014-04-16 DIAGNOSIS — M79609 Pain in unspecified limb: Secondary | ICD-10-CM | POA: Insufficient documentation

## 2014-04-16 DIAGNOSIS — L02219 Cutaneous abscess of trunk, unspecified: Secondary | ICD-10-CM | POA: Diagnosis present

## 2014-04-16 DIAGNOSIS — G8929 Other chronic pain: Secondary | ICD-10-CM | POA: Insufficient documentation

## 2014-04-16 DIAGNOSIS — L739 Follicular disorder, unspecified: Secondary | ICD-10-CM

## 2014-04-16 DIAGNOSIS — Z8679 Personal history of other diseases of the circulatory system: Secondary | ICD-10-CM | POA: Insufficient documentation

## 2014-04-16 MED ORDER — CEPHALEXIN 500 MG PO CAPS: 500.0000 mg | ORAL_CAPSULE | Freq: Four times a day (QID) | ORAL | Status: DC
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
## 2014-04-16 NOTE — Discharge Instructions (Signed)
Please read and follow all provided instructions.  Your diagnoses today include:  1. Folliculitis     Tests performed today include:  Vital signs. See below for your results today.   Medications prescribed:   Keflex (cephalexin) - antibiotic  You have been prescribed an antibiotic medicine: take the entire course of medicine even if you are feeling better. Stopping early can cause the antibiotic not to work.  Take any prescribed medications only as directed.   Home care instructions:  Follow any educational materials contained in this packet. Keep affected area above the level of your heart when possible. Wash area gently twice a day with warm soapy water. Do not apply alcohol or hydrogen peroxide. Cover the area if it draining or weeping.   Follow-up instructions: *Return to the Emergency Department in 48 hours for a recheck if your symptoms are not significantly improved.   Please follow-up with your primary care provider in the next 2 days for further evaluation of your symptoms.   Return instructions:  Return to the Emergency Department if you have:  Fever  Worsening symptoms  Worsening pain  Worsening swelling  Redness of the skin that moves away from the affected area, especially if it streaks away from the affected area   Any other emergent concerns  Your vital signs today were: BP 150/69   Pulse 57   Temp(Src) 97.6 F (36.4 C) (Oral)   Resp 18   Ht 5\' 5"  (1.651 m)   Wt 180 lb (81.647 kg)   BMI 29.95 kg/m2   SpO2 98% If your blood pressure (BP) was elevated above 135/85 this visit, please have this repeated by your doctor within one month. --------------

## 2014-04-16 NOTE — ED Notes (Signed)
Declined W/C at D/C and was escorted to lobby by RN. 

## 2014-04-16 NOTE — ED Notes (Signed)
Per pt sts that she was possibly bitten by a spider yesterday. sts in her groin area. Pt denies any other associated symptoms besides pain at the site. sts it has a white head.

## 2014-04-16 NOTE — ED Notes (Signed)
Pt has sore in right groin area. Appears to be open. Pt reports that it is very painful. Has been trying to "get something out of it" but was unable. "I was going to stick it with a pin".

## 2014-04-19 NOTE — ED Provider Notes (Signed)
Medical screening examination/treatment/procedure(s) were performed by non-physician practitioner and as supervising physician I was immediately available for consultation/collaboration.   EKG Interpretation None        Mariea Clonts, MD 04/19/14 [phone]

## 2014-04-24 ENCOUNTER — Telehealth: Payer: Self-pay | Admitting: Family Medicine

## 2014-04-24 ENCOUNTER — Other Ambulatory Visit (INDEPENDENT_AMBULATORY_CARE_PROVIDER_SITE_OTHER): Payer: Commercial Managed Care - HMO

## 2014-04-24 DIAGNOSIS — R5383 Other fatigue: Secondary | ICD-10-CM

## 2014-04-24 DIAGNOSIS — E890 Postprocedural hypothyroidism: Secondary | ICD-10-CM

## 2014-04-24 DIAGNOSIS — E7800 Pure hypercholesterolemia, unspecified: Secondary | ICD-10-CM

## 2014-04-24 DIAGNOSIS — R5381 Other malaise: Secondary | ICD-10-CM

## 2014-04-24 LAB — COMPREHENSIVE METABOLIC PANEL
ALK PHOS: 53 U/L (ref 39–117)
ALT: 24 U/L (ref 0–35)
AST: 29 U/L (ref 0–37)
Albumin: 4 g/dL (ref 3.5–5.2)
BILIRUBIN TOTAL: 0.5 mg/dL (ref 0.2–1.2)
BUN: 13 mg/dL (ref 6–23)
CO2: 26 meq/L (ref 19–32)
CREATININE: 0.8 mg/dL (ref 0.4–1.2)
Calcium: 9.4 mg/dL (ref 8.4–10.5)
Chloride: 106 mEq/L (ref 96–112)
GFR: 79.06 mL/min (ref >60.00)
GLUCOSE: 106 mg/dL — AB (ref 70–99)
Potassium: 4.1 mEq/L (ref 3.5–5.1)
SODIUM: 140 meq/L (ref 135–145)
TOTAL PROTEIN: 7.1 g/dL (ref 6.0–8.3)

## 2014-04-24 LAB — LIPID PANEL
CHOLESTEROL: 236 mg/dL — AB (ref 0–200)
HDL: 55.3 mg/dL (ref >39.00)
LDL CALC: 155 mg/dL — AB (ref 0–99)
NonHDL: 180.7
TRIGLYCERIDES: 131 mg/dL (ref 0.0–149.0)
Total CHOL/HDL Ratio: 4
VLDL: 26.2 mg/dL (ref 0.0–40.0)

## 2014-04-24 LAB — TSH: TSH: 5.1 u[IU]/mL — AB (ref 0.35–4.50)

## 2014-04-24 NOTE — Telephone Encounter (Signed)
Message copied by Jinny Sanders on Tue Apr 24, 2014  7:28 AM ------      Message from: Ellamae Sia      Created: Thu Apr 19, 2014  3:54 PM      Regarding: Lab orders for Tuesday, 7.14.15       Patient is scheduled for CPX labs, please order future labs, Thanks , Terri       ------

## 2014-05-01 ENCOUNTER — Ambulatory Visit (INDEPENDENT_AMBULATORY_CARE_PROVIDER_SITE_OTHER): Payer: Commercial Managed Care - HMO | Admitting: Family Medicine

## 2014-05-01 ENCOUNTER — Encounter: Payer: Self-pay | Admitting: Family Medicine

## 2014-05-01 VITALS — BP 128/78 | HR 60 | Temp 98.5°F | Ht 64.0 in | Wt 174.0 lb

## 2014-05-01 DIAGNOSIS — R7309 Other abnormal glucose: Secondary | ICD-10-CM

## 2014-05-01 DIAGNOSIS — Z0000 Encounter for general adult medical examination without abnormal findings: Secondary | ICD-10-CM

## 2014-05-01 DIAGNOSIS — E7800 Pure hypercholesterolemia, unspecified: Secondary | ICD-10-CM

## 2014-05-01 DIAGNOSIS — E890 Postprocedural hypothyroidism: Secondary | ICD-10-CM

## 2014-05-01 DIAGNOSIS — R7303 Prediabetes: Secondary | ICD-10-CM

## 2014-05-01 LAB — T3, FREE: T3, Free: 2.4 pg/mL (ref 2.3–4.2)

## 2014-05-01 LAB — T4, FREE: Free T4: 0.84 ng/dL (ref 0.60–1.60)

## 2014-05-01 NOTE — Progress Notes (Signed)
Subjective:    Patient ID: Sherri Murray, female    DOB: 1941-03-06, 73 y.o.   MRN: [id number]  HPI I have personally reviewed the Medicare Annual Wellness questionnaire and have noted 1. The patient's medical and social history 2. Their use of alcohol, tobacco or illicit drugs 3. Their current medications and supplements 4. The patient's functional ability including ADL's, fall risks, home safety risks and hearing or visual             impairment. 5. Diet and physical activities 6. Evidence for depression or mood disorders The patients weight, height, BMI and visual acuity have been recorded in the chart I have made referrals, counseling and provided education to the patient based review of the above and I have provided the pt with a written personalized care plan for preventive services.  She has been experiencing hair loss on head in last year. She has also noted arm and leg hair is gone in last year.  She has post surgical hypothyroidism,removal of thyroid in 1963, She was on thyroid med for few years but nevre restarted.  Pain in legs from varicose veins: followed by  vascular.  Recommended treatment with ? Surgery, sclerotherapy?  Awaiting insurance approval.   Asthma,Allergic rhinitis:  Followed by Dr. Rhetta Mura allergist.  GERD, On protonix.   Urinary urge incon:[facility], Dr. Jacqlyn Larsen as mybetriq is not helping. Plans procedure.   Arthralgis, ? Fibromyalgia:  Followed by Dr. Jefm Bryant. Recently had joint lubricant.  May need surgery.  History   Social History  . Marital Status: Single    Spouse Name: N/A    Number of Children: 2  . Years of Education: HS   Occupational History  . Retired     used to work in [employer], Research scientist (physical sciences)   Social History Main Topics  . Smoking status: Never Smoker   . Smokeless tobacco: Never Used  . Alcohol Use: Yes     Comment: wine daily - 1 glass sometimes.  . Drug Use: No  . Sexual Activity: Not Currently    Partners: Male    Birth  Control/ Protection: Post-menopausal     Comment: tubaligation   Other Topics Concern  . None   Social History Narrative   Divorced, Lives at home with her fiance, lives in [place].   Daily caffeine--coffee    Limited exercise   Healthy eating   Reviewed  2015 end of life planning, has HCPOA ( daughters),  Full code                        Review of Systems  All other systems reviewed and are negative.      Objective:   Physical Exam  Constitutional: Vital signs are normal. She appears well-developed and well-nourished. She is cooperative.  Non-toxic appearance. She does not appear ill. No distress.  HENT:  Head: Normocephalic.  Right Ear: Hearing, tympanic membrane, external ear and ear canal normal.  Left Ear: Hearing, tympanic membrane, external ear and ear canal normal.  Nose: Nose normal.  Eyes: Conjunctivae, EOM and lids are normal. Pupils are equal, round, and reactive to light. Lids are everted and swept, no foreign bodies found.  Neck: Trachea normal and normal range of motion. Neck supple. Carotid bruit is not present. No mass and no thyromegaly present.  Cardiovascular: Normal rate, regular rhythm, S1 normal, S2 normal, normal heart sounds and intact distal pulses.  Exam reveals no gallop.   No murmur heard. Pulmonary/Chest:  Effort normal and breath sounds normal. No respiratory distress. She has no wheezes. She has no rhonchi. She has no rales.  Abdominal: Soft. Normal appearance and bowel sounds are normal. She exhibits no distension, no fluid wave, no abdominal bruit and no mass. There is no hepatosplenomegaly. There is no tenderness. There is no rebound, no guarding and no CVA tenderness. No hernia.  Genitourinary: Vagina normal and uterus normal. No breast swelling, tenderness, discharge or bleeding. Pelvic exam was performed with patient supine. There is no rash, tenderness or lesion on the right labia. There is no rash, tenderness or lesion on the left  labia. Uterus is not enlarged and not tender. Right adnexum displays no mass, no tenderness and no fullness. Left adnexum displays no mass, no tenderness and no fullness.  No pap  Lymphadenopathy:    She has no cervical adenopathy.    She has no axillary adenopathy.  Neurological: She is alert. She has normal strength. No cranial nerve deficit or sensory deficit.  Skin: Skin is warm, dry and intact. No rash noted.  Psychiatric: Her speech is normal and behavior is normal. Judgment normal. Her mood appears not anxious. Cognition and memory are normal. She does not exhibit a depressed mood.          Assessment & Plan:  The patient's preventative maintenance and recommended screening tests for an annual wellness exam were reviewed in full today. Brought up to date unless services declined.  Counselled on the importance of diet, exercise, and its role in overall health and mortality. The patient's FH and SH was reviewed, including their home life, tobacco status, and drug and alcohol status.   Vaccines: Due for prevnar. Uptodate with shingles, PNA, Td.  Colon: ischemic colitis 2013 , Dr. Carlean Purl  Repeat in 10 years.  DEXA: Plans to schedule  MAMMO: 06/2013 ,  Plans to schedule.  DVE/PAP:Not indicated but would like DVE every few years.  Mother with colon cancer, no ovarian no uterine cancer.

## 2014-05-01 NOTE — Patient Instructions (Addendum)
Stop at lab on way out. Call to schedule DEXA and mammogram on your own. Follow up in 6 months with labs prior for chol check.  High Cholesterol High cholesterol refers to having a high level of cholesterol in your blood. Cholesterol is a white, waxy, fat-like protein that your body needs in small amounts. Your liver makes all the cholesterol you need. Excess cholesterol comes from the food you eat. Cholesterol travels in your bloodstream through your blood vessels. If you have high cholesterol, deposits (plaque) may build up on the walls of your blood vessels. This makes the arteries narrower and stiffer. Plaque increases your risk of heart attack and stroke. Work with your health care provider to keep your cholesterol levels in a healthy range. RISK FACTORS Several things can make you more likely to have high cholesterol. These include:   Eating foods high in animal fat (saturated fat) or cholesterol.  Being overweight.  Not getting enough exercise.  Having a family history of high cholesterol. SIGNS AND SYMPTOMS High cholesterol does not cause symptoms. DIAGNOSIS  Your health care provider can do a blood test to check whether you have high cholesterol. If you are older than 20, your health care provider may check your cholesterol every 4-6 years. You may be checked more often if you already have high cholesterol or other risk factors for heart disease. The blood test for cholesterol measures the following:  Bad cholesterol (LDL cholesterol). This is the type of cholesterol that causes heart disease. This number should be less than 100.  Good cholesterol (HDL cholesterol). This type helps protect against heart disease. A healthy level of HDL cholesterol is 60 or higher.  Total cholesterol. This is the combined number of LDL cholesterol and HDL cholesterol. A healthy number is less than 200. TREATMENT  High cholesterol can be treated with diet changes, lifestyle changes, and medicine.    Diet changes may include eating more whole grains, fruits, vegetables, nuts, and fish. You may also have to cut back on red meat and foods with a lot of added sugar.  Lifestyle changes may include getting at least 40 minutes of aerobic exercise three times a week. Aerobic exercises include walking, biking, and swimming. Aerobic exercise along with a healthy diet can help you maintain a healthy weight. Lifestyle changes may also include quitting smoking.  If diet and lifestyle changes are not enough to lower your cholesterol, your health care provider may prescribe a statin medicine. This medicine has been shown to lower cholesterol and also lower the risk of heart disease. HOME CARE INSTRUCTIONS  Only take over-the-counter or prescription medicines as directed by your health care provider.   Follow a healthy diet as directed by your health care provider. For instance:   Eat chicken (without skin), fish, veal, shellfish, ground [place] breast, and round or loin cuts of red meat.  Do not eat fried foods and fatty meats, such as hot dogs and salami.   Eat plenty of fruits, such as apples.   Eat plenty of vegetables, such as broccoli, potatoes, and carrots.   Eat beans, peas, and lentils.   Eat grains, such as barley, rice, couscous, and bulgur wheat.   Eat pasta without cream sauces.   Use skim or nonfat milk and low-fat or nonfat yogurt and cheeses. Do not eat or drink whole milk, cream, ice cream, egg yolks, and hard cheeses.   Do not eat stick margarine or tub margarines that contain trans fats (also called partially hydrogenated  oils).   Do not eat cakes, cookies, crackers, or other baked goods that contain trans fats.   Do not eat saturated tropical oils, such as coconut and palm oil.   Exercise as directed by your health care provider. Increase your activity level with activities such as gardening or walking.   Keep all follow-up appointments.  SEEK MEDICAL  CARE IF:  You are struggling to maintain a healthy diet or weight.  You need help starting an exercise program.  You need help to stop smoking. SEEK IMMEDIATE MEDICAL CARE IF:  You have chest pain.  You have trouble breathing. Document Released: 09/28/2005 Document Revised: 10/03/2013 Document Reviewed: 07/21/2013 [facility] Patient Information 2015 [place], [place]. This information is not intended to replace advice given to you by your health care provider. Make sure you discuss any questions you have with your health care provider.

## 2014-05-01 NOTE — Progress Notes (Signed)
Pre visit review using our clinic review tool, if applicable. No additional management support is needed unless otherwise documented below in the visit note. 

## 2014-05-03 ENCOUNTER — Telehealth: Payer: Self-pay | Admitting: Family Medicine

## 2014-05-03 DIAGNOSIS — L659 Nonscarring hair loss, unspecified: Secondary | ICD-10-CM

## 2014-05-03 NOTE — Telephone Encounter (Signed)
Message copied by Diona Browner Evon Lopezperez E on Thu May 03, 2014  2:00 AM ------      Message from: Carter Kitten      Created: Wed May 02, 2014 10:48 AM       Ms. Franta notified as instructed by telephone.  She would like a referral to Dermatology.  She has been seen at [facility] in the past. ------

## 2014-05-08 ENCOUNTER — Ambulatory Visit (INDEPENDENT_AMBULATORY_CARE_PROVIDER_SITE_OTHER): Payer: Commercial Managed Care - HMO | Admitting: Obstetrics & Gynecology

## 2014-05-08 ENCOUNTER — Encounter: Payer: Self-pay | Admitting: Obstetrics & Gynecology

## 2014-05-08 VITALS — BP 148/85 | HR 56 | Ht 64.0 in | Wt 174.0 lb

## 2014-05-08 DIAGNOSIS — L659 Nonscarring hair loss, unspecified: Secondary | ICD-10-CM | POA: Insufficient documentation

## 2014-05-08 DIAGNOSIS — R6882 Decreased libido: Secondary | ICD-10-CM | POA: Insufficient documentation

## 2014-05-08 NOTE — Patient Instructions (Signed)
Alopecia Areata Alopecia areata is a self-destructing (autoimmune) disease that results in the loss of hair. In this condition your body's immune system attacks the hair follicle. The hair follicle is responsible for growing hair. Hair loss can occur on the scalp and other parts of the body. It usually starts as one or more small, round, smooth patches of hair loss. It occurs in males and females of all ages and races, but usually starts before age 74. The scalp is the most commonly affected area, but the beard or any hair-bearing site can be affected. This type of hair loss does not leave scars where the hair was lost.  Many people with alopecia areata only have a few patches of hair loss. In others, extensive patchy hair loss occurs. In a few people, all scalp hair is lost (alopecia totalis), or hair is lost from the entire scalp and body (alopecia universalis). No matter how widespread the hair loss, the hair follicles remain alive and are ready to resume normal hair production whenever they receive the correct signal. Hair re-growth may occur without treatment and can even restart after years of hair loss.  CAUSES  It is thought that something triggers the immune system to stop hair growth. It is not always known what the cause is. Some people have genetic markers that can increase the chance of developing alopecia areata. Alopecia areata often occurs in families whose members have had:  Asthma.  Hay fever.  Atopic eczema.  Some autoimmune diseases may also be a trigger, such as:  Thyroid disease.  Diabetes.  Rheumatoid arthritis.  Lupus erythematosus.  Vitiligo.  Pernicious anemia.  Addison's disease. OTHER SYMPTOMS In some people, the nail beds may develop rows of tiny dents (stippling) or the nail beds can become distorted. Other than the hair and nail beds, no other body part is affected.  PROGNOSIS  Alopecia areata is not medically disabling. People with alopecia areata are  usually in excellent health. Hair loss can be emotionally difficult. The National Alopecia Areata Foundation has resources available to help individuals and families with alopecia areata. Their goal is to help people with the condition live full, productive lives. There are many successful, well-adjusted, contented people living with Alopecia areata. Alopecia areata can be overcome with:  A positive self image.  Sound medical facts.  The support of others, such as:  Sometimes professional counseling is helpful to develop one's self-confidence and positive self-image. TREATMENT  There is no cure for alopecia areata. There are several available treatments. Treatments are most effective in milder cases. No treatment is effective for everyone. Choice of treatment depends mainly on a person's age and the extent of their hair loss. Alopecia areata occurs in two forms:   A mild patchy form where less than 50 percent of scalp hair is lost.  An extensive form where greater than 50 percent of scalp hair is lost. These two forms of alopecia areata behave quite differently, and the choice of treatment depends on which form is present. Current treatments do not turn alopecia areata off. They can stimulate the hair follicle to produce hair.  Some medications used to treat mild cases include:  Cortisone injections. The most common treatment is the injection of cortisone into the bare skin patches. The injections are usually given by a caregiver specializing in skin issues (dermatologist). This caregiver will use a tiny needle to give multiple injections into the skin in and around the bare patches. The injections are repeated once a month.   If new hair growth occurs, it is usually visible within 4 weeks. Treatment does not prevent new patches of hair loss from developing. There are few side effects from local cortisone injections. Occasionally, temporary dents (depressions) in the skin result from the local  injections, but these dents can fill in by themselves.  Topical minoxidil. Five percent topical minoxidil solution applied twice daily may grow hair in alopecia areata. Scalp, eyebrows, and beard hair may respond. If scalp hair re-grows completely, treatment can be stopped. Response may improve if topical cortisone cream is applied 30 minutes after the minoxidil. Topical minoxidil is safe, easy to use, and does not lower blood pressure in persons with normal blood pressure. Minoxidil can lead to unwanted facial hair growth in some people.  Anthralin cream or ointment. Another treatment is the application of anthralin cream or ointment. Anthralin is a tar-like substance that has been used widely for psoriasis. Anthralin is applied to the bare patches once daily. It is washed off after a short time, usually 30 to 60 minutes later. If new hair growth occurs, it is seen in 8 to 12 weeks. Anthralin can be irritating to the skin. It can cause temporary, brownish discoloration of the treated skin. By using short treatment times, skin irritation and skin staining are reduced without decreasing effectiveness. Care must be taken not to get anthralin in the eyes. Some of the medications used for more extensive cases where there is greater than 50% hair loss include:  Cortisone pills. Cortisone pills are sometimes given for extensive scalp hair loss. Cortisone taken internally is much stronger than local injections of cortisone into the skin. It is necessary to discuss possible side effects of cortisone pills with your caregiver. In general, however, cortisone pills are used in relatively few patients with alopecia areata due to health risks from prolonged use. Also, hair that has grown is likely to fall out when the cortisone pills are stopped.  Topical minoxidil. See previous explanation under mild, patchy alopecia areata. However, minoxidil is not effective in total loss of scalp hair (alopecia totalis).  Topical  immunotherapy. Another method of treating alopecia areata or alopecia totalis/universalis involves producing an allergic rash or allergic contact dermatitis. Chemicals such as diphencyprone (DPCP) or squaric acid dibutyl ester (SADBE) are applied to the scalp to produce an allergic rash which resembles poison oak or ivy. Approximately 40% of patients treated with topical immunotherapy will re-grow scalp hair after about 6 months of treatment. Those who do successfully re-grow scalp hair will need to continue treatment to maintain hair re-growth.  Wigs. For extensive hair loss, a wig can be an important option for some people. Proper attention will make a quality wig look completely natural. A wig will need to be cut, thinned, and styled. To keep a net base wig from falling off, special double-sided tape can be purchased in beauty supply outlets and fastened to the inside of the wig.  For those with completely bare heads, there are suction caps to which any wig can be attached. There are also entire suction cap wig units. FOR MORE INFORMATION National Alopecia Areata Foundation: www.naaf.org Document Released: 05/02/2004 Document Revised: 12/21/2011 Document Reviewed: 12/18/2013 [employer] Patient Information 2015 [employer]. This information is not intended to replace advice given to you by your health care provider. Make sure you discuss any questions you have with your health care provider.  

## 2014-05-08 NOTE — Progress Notes (Signed)
   CLINIC ENCOUNTER NOTE  History:  73 y.o. G2P2 here today for evaluation of alopecia  The following portions of the patient's history were reviewed and updated as appropriate: allergies, current medications, past family history, past medical history, past social history, past surgical history and problem list.  Review of Systems:  Pertinent items are noted in HPI.  Objective:  Physical Exam BP 148/85  Pulse 56  Ht 5\' 4"  (1.626 m)  Wt 174 lb (78.926 kg)  BMI 29.85 kg/m2 Gen: Multiple patches of hair loss noted on scalp. Thin chestnut hair with some grays visible.  Assessment & Plan:  Will check DHEAS, fre and total testosterone levels.  Norma,l T3 and T4, will check PRL Refer to Dermatology   Verita Schneiders, MD, [place] Attending [place] for [facility], [place]

## 2014-05-09 LAB — TESTOSTERONE, FREE, TOTAL, SHBG
SEX HORMONE BINDING: 49 nmol/L (ref 18–114)
TESTOSTERONE FREE: 6 pg/mL (ref 0.6–6.8)
TESTOSTERONE-% FREE: 1.4 % (ref 0.4–2.4)
TESTOSTERONE: 43 ng/dL (ref 10–70)

## 2014-05-09 LAB — PROLACTIN: Prolactin: 6.2 ng/mL

## 2014-05-09 LAB — DHEA-SULFATE: DHEA SO4: 17 ug/dL — AB (ref 35–430)

## 2014-06-16 ENCOUNTER — Other Ambulatory Visit: Payer: Self-pay | Admitting: Internal Medicine

## 2014-08-13 ENCOUNTER — Encounter: Payer: Self-pay | Admitting: Obstetrics & Gynecology

## 2014-08-13 ENCOUNTER — Other Ambulatory Visit: Payer: Self-pay | Admitting: Internal Medicine

## 2014-09-04 ENCOUNTER — Telehealth: Payer: Self-pay | Admitting: Family Medicine

## 2014-09-04 DIAGNOSIS — E2839 Other primary ovarian failure: Secondary | ICD-10-CM

## 2014-09-04 DIAGNOSIS — Z1239 Encounter for other screening for malignant neoplasm of breast: Secondary | ICD-10-CM

## 2014-09-04 NOTE — Telephone Encounter (Signed)
Orders entered in EPIC.  Left message with Mr. Sherri Murray that orders are entered and Mrs. Sherri Murray can call and schedule her appointments.

## 2014-09-04 NOTE — Telephone Encounter (Signed)
Pt called and needs a screening mammogram and bone density order for [facility] placed in [facility].  Please call patient when orders have been placed and she will call to schedule herself [phone]

## 2014-09-04 NOTE — Telephone Encounter (Signed)
She can call for [facility] referral herself. No referral ever is needed.  Can you help put in dexa

## 2014-09-08 ENCOUNTER — Other Ambulatory Visit: Payer: Self-pay | Admitting: Internal Medicine

## 2014-10-02 ENCOUNTER — Ambulatory Visit (HOSPITAL_COMMUNITY)
Admission: RE | Admit: 2014-10-02 | Discharge: 2014-10-02 | Disposition: A | Discharge status: Home / Self Care | Payer: Commercial Managed Care - HMO | Source: Ambulatory Visit | Attending: Family Medicine | Admitting: Family Medicine

## 2014-10-02 DIAGNOSIS — Z1231 Encounter for screening mammogram for malignant neoplasm of breast: Secondary | ICD-10-CM | POA: Diagnosis present

## 2014-10-02 DIAGNOSIS — E2839 Other primary ovarian failure: Secondary | ICD-10-CM

## 2014-10-02 DIAGNOSIS — Z1239 Encounter for other screening for malignant neoplasm of breast: Secondary | ICD-10-CM

## 2014-10-02 DIAGNOSIS — M8580 Other specified disorders of bone density and structure, unspecified site: Secondary | ICD-10-CM | POA: Insufficient documentation

## 2014-10-15 ENCOUNTER — Encounter: Payer: Self-pay | Admitting: Family Medicine

## 2014-10-19 ENCOUNTER — Ambulatory Visit (INDEPENDENT_AMBULATORY_CARE_PROVIDER_SITE_OTHER): Payer: Commercial Managed Care - HMO | Admitting: Family Medicine

## 2014-10-19 ENCOUNTER — Encounter: Payer: Self-pay | Admitting: Family Medicine

## 2014-10-19 VITALS — BP 170/82 | HR 54 | Temp 97.4°F | Ht 64.0 in | Wt 167.0 lb

## 2014-10-19 DIAGNOSIS — B372 Candidiasis of skin and nail: Secondary | ICD-10-CM

## 2014-10-19 DIAGNOSIS — R030 Elevated blood-pressure reading, without diagnosis of hypertension: Secondary | ICD-10-CM

## 2014-10-19 DIAGNOSIS — I10 Essential (primary) hypertension: Secondary | ICD-10-CM | POA: Insufficient documentation

## 2014-10-19 DIAGNOSIS — R32 Unspecified urinary incontinence: Secondary | ICD-10-CM | POA: Diagnosis not present

## 2014-10-19 MED ORDER — FLUCONAZOLE 150 MG PO TABS: 150.0000 mg | ORAL_TABLET | Freq: Once | ORAL | Status: DC

## 2014-10-19 MED ORDER — NYSTATIN 100000 UNIT/GM EX CREA: 1.0000 "application " | TOPICAL_CREAM | Freq: Three times a day (TID) | CUTANEOUS | Status: DC

## 2014-10-19 NOTE — Progress Notes (Signed)
Pre visit review using our clinic review tool, if applicable. No additional management support is needed unless otherwise documented below in the visit note. 

## 2014-10-19 NOTE — Progress Notes (Signed)
   Subjective:    Patient ID: Sherri Murray, female    DOB: September 21, 1941, 74 y.o.   MRN: [id number]  HPI 74 year old female presents  To update medical conditions as well as for yeast issues.   She has been having odor, redness under breasts and in vaginal area. Some odor, itching, no discharge.  She has been told she has yeast around toenails by Dr. Elvina Mattes podiatrist. She has been applying aloe vera without any relief. No dysuria, or fever.   She has seen  Dr. Jacqlyn Larsen , and has had interstim placed for incontinence. Bowel issues have resolved.  She has been told by MDs lately that her BP is high. HTN runs in family. She is not currently on any medication. BP Readings from Last 3 Encounters:  10/19/14 170/82  05/08/14 148/85  05/01/14 128/78       Review of Systems  Constitutional: Negative for fever and fatigue.  HENT: Negative for ear pain.   Eyes: Negative for pain.  Respiratory: Negative for chest tightness and shortness of breath.   Cardiovascular: Negative for chest pain, palpitations and leg swelling.  Gastrointestinal: Negative for abdominal pain.  Genitourinary: Negative for dysuria.       Objective:   Physical Exam  Constitutional: Vital signs are normal. She appears well-developed and well-nourished. She is cooperative.  Non-toxic appearance. She does not appear ill. No distress.  HENT:  Head: Normocephalic.  Right Ear: Hearing, tympanic membrane, external ear and ear canal normal. Tympanic membrane is not erythematous, not retracted and not bulging.  Left Ear: Hearing, tympanic membrane, external ear and ear canal normal. Tympanic membrane is not erythematous, not retracted and not bulging.  Nose: No mucosal edema or rhinorrhea. Right sinus exhibits no maxillary sinus tenderness and no frontal sinus tenderness. Left sinus exhibits no maxillary sinus tenderness and no frontal sinus tenderness.  Mouth/Throat: Uvula is midline, oropharynx is clear and moist and  mucous membranes are normal.  Eyes: Conjunctivae, EOM and lids are normal. Pupils are equal, round, and reactive to light. Lids are everted and swept, no foreign bodies found.  Neck: Trachea normal and normal range of motion. Neck supple. Carotid bruit is not present. No thyroid mass and no thyromegaly present.  Cardiovascular: Normal rate, regular rhythm, S1 normal, S2 normal, normal heart sounds, intact distal pulses and normal pulses.  Exam reveals no gallop and no friction rub.   No murmur heard. Pulmonary/Chest: Effort normal and breath sounds normal. No tachypnea. No respiratory distress. She has no decreased breath sounds. She has no wheezes. She has no rhonchi. She has no rales.  Abdominal: Soft. Normal appearance and bowel sounds are normal. There is no tenderness.  Neurological: She is alert.  Skin: Skin is warm, dry and intact. No rash noted.  Erythema under Bilateral breasts mild  Toes with  with erythematous  Nail beds  Psychiatric: Her speech is normal and behavior is normal. Judgment and thought content normal. Her mood appears not anxious. Cognition and memory are normal. She does not exhibit a depressed mood.          Assessment & Plan:

## 2014-10-19 NOTE — Patient Instructions (Addendum)
Apply nystatin cream 3-4 times a day under breasts and in groin creases. Start diflucan x 1 dose.  Check blood   pressure daily x 2 weeks, call or email results in 2 weeks. Goal BP < 140/90. If consistently above at home, we will need to start a medication.

## 2014-11-09 ENCOUNTER — Other Ambulatory Visit: Payer: Self-pay | Admitting: Internal Medicine

## 2014-11-20 ENCOUNTER — Ambulatory Visit: Payer: Commercial Managed Care - HMO | Admitting: Family Medicine

## 2014-11-22 DIAGNOSIS — M1711 Unilateral primary osteoarthritis, right knee: Secondary | ICD-10-CM | POA: Diagnosis not present

## 2014-11-22 DIAGNOSIS — M25561 Pain in right knee: Secondary | ICD-10-CM | POA: Diagnosis not present

## 2014-11-23 NOTE — Assessment & Plan Note (Signed)
Improved with interstim.

## 2014-11-23 NOTE — Assessment & Plan Note (Signed)
Check blood   pressure daily x 2 weeks, call or email results in 2 weeks. Goal BP < 140/90. If consistently above at home, we will need to start a medication.

## 2014-11-23 NOTE — Assessment & Plan Note (Signed)
Apply nystatin cream 3-4 times a day under breasts and in groin creases. Start diflucan x 1 dose.

## 2014-12-04 ENCOUNTER — Ambulatory Visit: Payer: Commercial Managed Care - HMO | Admitting: Family Medicine

## 2014-12-06 ENCOUNTER — Encounter: Payer: Self-pay | Admitting: Family Medicine

## 2014-12-06 ENCOUNTER — Ambulatory Visit (INDEPENDENT_AMBULATORY_CARE_PROVIDER_SITE_OTHER): Payer: Medicare Other | Admitting: Family Medicine

## 2014-12-06 VITALS — BP 171/93 | HR 52 | Temp 98.1°F | Ht 64.0 in | Wt 169.8 lb

## 2014-12-06 DIAGNOSIS — R0789 Other chest pain: Secondary | ICD-10-CM | POA: Diagnosis not present

## 2014-12-06 DIAGNOSIS — I10 Essential (primary) hypertension: Secondary | ICD-10-CM

## 2014-12-06 LAB — POCT URINALYSIS DIPSTICK
BILIRUBIN UA: NEGATIVE
GLUCOSE UA: NEGATIVE
KETONES UA: NEGATIVE
Leukocytes, UA: NEGATIVE
Nitrite, UA: NEGATIVE
Protein, UA: NEGATIVE
RBC UA: NEGATIVE
SPEC GRAV UA: 1.01
Urobilinogen, UA: 0.2
pH, UA: 6

## 2014-12-06 NOTE — Assessment & Plan Note (Addendum)
New diagnosis. Will eval for end organ damage, secondary cause and risk factors. ONCe labs return.. Will start med. Likely losartan HCTZ. Close follow up in 2 weeks.

## 2014-12-06 NOTE — Patient Instructions (Addendum)
Make appt to return for fasting labs ASAP Once labs back we will start a BP med. Follow up in 2 weeks for BP check. Stop at front desk for referral for ECHO of heart.

## 2014-12-06 NOTE — Progress Notes (Signed)
Pre visit review using our clinic review tool, if applicable. No additional management support is needed unless otherwise documented below in the visit note. 

## 2014-12-06 NOTE — Progress Notes (Signed)
   Subjective:    Patient ID: Sherri Murray, female    DOB: [birth year], 74 y.o.   MRN: [id number]  HPI   74 year old female presents for 1 month follow up HTN.   At last OV BP was elevated in the office. She was told to follow at home and call, but she did not .  Today she reports  At home she has had 132-170/ 66-80 HR 50-62.  She  had history of HTN and CVA with her mother. Occ headache, squeeze in head. Occ palpitations, occ chest pain/pressure at rest. No clear exertional pain.  No swelling in ankles.  BP Readings from Last 3 Encounters:  12/06/14 171/93  10/19/14 170/82  05/08/14 148/85       Review of Systems  Constitutional: Negative for fever and fatigue.  HENT: Negative for ear pain.   Eyes: Negative for pain.  Respiratory: Negative for chest tightness and shortness of breath.   Cardiovascular: Negative for chest pain, palpitations and leg swelling.  Gastrointestinal: Negative for abdominal pain.  Genitourinary: Negative for dysuria.       Objective:   Physical Exam  Constitutional: She is oriented to person, place, and time. Vital signs are normal. She appears well-developed and well-nourished. She is cooperative.  Non-toxic appearance. She does not appear ill. No distress.  HENT:  Head: Normocephalic.  Right Ear: Hearing, tympanic membrane, external ear and ear canal normal. Tympanic membrane is not erythematous, not retracted and not bulging.  Left Ear: Hearing, tympanic membrane, external ear and ear canal normal. Tympanic membrane is not erythematous, not retracted and not bulging.  Nose: No mucosal edema or rhinorrhea. Right sinus exhibits no maxillary sinus tenderness and no frontal sinus tenderness. Left sinus exhibits no maxillary sinus tenderness and no frontal sinus tenderness.  Mouth/Throat: Uvula is midline, oropharynx is clear and moist and mucous membranes are normal.  Eyes: Conjunctivae, EOM and lids are normal. Pupils are equal, round, and  reactive to light. Lids are everted and swept, no foreign bodies found.  Neck: Trachea normal and normal range of motion. Neck supple. Carotid bruit is not present. No thyroid mass and no thyromegaly present.  Cardiovascular: Normal rate, regular rhythm, S1 normal, S2 normal, normal heart sounds, intact distal pulses and normal pulses.  Exam reveals no gallop and no friction rub.   No murmur heard. Pulmonary/Chest: Effort normal and breath sounds normal. No tachypnea. No respiratory distress. She has no decreased breath sounds. She has no wheezes. She has no rhonchi. She has no rales.  Abdominal: Soft. Normal appearance and bowel sounds are normal. There is no tenderness.  Neurological: She is alert and oriented to person, place, and time. She has normal strength and normal reflexes. No cranial nerve deficit or sensory deficit. She exhibits normal muscle tone. She displays a negative Romberg sign. Coordination and gait normal. GCS eye subscore is 4. GCS verbal subscore is 5. GCS motor subscore is 6.  Nml cerebellar exam   No papilledema  Skin: Skin is warm, dry and intact. No rash noted.  Psychiatric: She has a normal mood and affect. Her speech is normal and behavior is normal. Judgment and thought content normal. Her mood appears not anxious. Cognition and memory are normal. Cognition and memory are not impaired. She does not exhibit a depressed mood. She exhibits normal recent memory and normal remote memory.          Assessment & Plan:

## 2014-12-07 ENCOUNTER — Other Ambulatory Visit (INDEPENDENT_AMBULATORY_CARE_PROVIDER_SITE_OTHER): Payer: Medicare Other

## 2014-12-07 ENCOUNTER — Telehealth: Payer: Self-pay | Admitting: Family Medicine

## 2014-12-07 ENCOUNTER — Telehealth: Payer: Self-pay | Admitting: *Deleted

## 2014-12-07 DIAGNOSIS — T63441A Toxic effect of venom of bees, accidental (unintentional), initial encounter: Secondary | ICD-10-CM | POA: Diagnosis not present

## 2014-12-07 DIAGNOSIS — I10 Essential (primary) hypertension: Secondary | ICD-10-CM

## 2014-12-07 DIAGNOSIS — E890 Postprocedural hypothyroidism: Secondary | ICD-10-CM | POA: Diagnosis not present

## 2014-12-07 LAB — CBC WITH DIFFERENTIAL/PLATELET
BASOS PCT: 0.5 % (ref 0.0–3.0)
Basophils Absolute: 0 10*3/uL (ref 0.0–0.1)
EOS ABS: 0.3 10*3/uL (ref 0.0–0.7)
Eosinophils Relative: 4.3 % (ref 0.0–5.0)
HCT: 39.5 % (ref 36.0–46.0)
HEMOGLOBIN: 13.5 g/dL (ref 12.0–15.0)
LYMPHS PCT: 39.5 % (ref 12.0–46.0)
Lymphs Abs: 2.8 10*3/uL (ref 0.7–4.0)
MCHC: 34.2 g/dL (ref 30.0–36.0)
MCV: 92 fl (ref 78.0–100.0)
Monocytes Absolute: 0.5 10*3/uL (ref 0.1–1.0)
Monocytes Relative: 7.5 % (ref 3.0–12.0)
NEUTROS ABS: 3.4 10*3/uL (ref 1.4–7.7)
Neutrophils Relative %: 48.2 % (ref 43.0–77.0)
Platelets: 266 10*3/uL (ref 150.0–400.0)
RBC: 4.3 Mil/uL (ref 3.87–5.11)
RDW: 13.6 % (ref 11.5–15.5)
WBC: 7 10*3/uL (ref 4.0–10.5)

## 2014-12-07 LAB — COMPREHENSIVE METABOLIC PANEL
ALT: 14 U/L (ref 0–35)
AST: 19 U/L (ref 0–37)
Albumin: 4 g/dL (ref 3.5–5.2)
Alkaline Phosphatase: 62 U/L (ref 39–117)
BUN: 17 mg/dL (ref 6–23)
CO2: 30 mEq/L (ref 19–32)
CREATININE: 0.87 mg/dL (ref 0.40–1.20)
Calcium: 9.5 mg/dL (ref 8.4–10.5)
Chloride: 107 mEq/L (ref 96–112)
GFR: 67.53 mL/min (ref >60.00)
GLUCOSE: 93 mg/dL (ref 70–99)
Potassium: 4 mEq/L (ref 3.5–5.1)
Sodium: 142 mEq/L (ref 135–145)
Total Bilirubin: 0.5 mg/dL (ref 0.2–1.2)
Total Protein: 6.9 g/dL (ref 6.0–8.3)

## 2014-12-07 LAB — LIPID PANEL
Cholesterol: 236 mg/dL — ABNORMAL HIGH (ref 0–200)
HDL: 55.4 mg/dL (ref >39.00)
LDL Cholesterol: 164 mg/dL — ABNORMAL HIGH (ref 0–99)
NonHDL: 180.6
Total CHOL/HDL Ratio: 4
Triglycerides: 83 mg/dL (ref 0.0–149.0)
VLDL: 16.6 mg/dL (ref 0.0–40.0)

## 2014-12-07 LAB — T3, FREE: T3, Free: 3.1 pg/mL (ref 2.3–4.2)

## 2014-12-07 LAB — T4, FREE: FREE T4: 0.87 ng/dL (ref 0.60–1.60)

## 2014-12-07 LAB — TSH: TSH: 5.59 u[IU]/mL — ABNORMAL HIGH (ref 0.35–4.50)

## 2014-12-07 MED ORDER — LOSARTAN POTASSIUM-HCTZ 50-12.5 MG PO TABS: 1.0000 | ORAL_TABLET | Freq: Every day | ORAL | Status: DC

## 2014-12-07 NOTE — Telephone Encounter (Signed)
emmi mailed  °

## 2014-12-07 NOTE — Telephone Encounter (Signed)
-----   Message from Jinny Sanders, MD sent at 12/07/2014  2:44 PM EST ----- Terri:Can we add free t3 and free t4?  Butch Penny: let pt know glucose is normal,  No anemia,  But cholesterol is high, LDL far from goal < 130.  Thyroid may be too low, but we will add some labs to eval further and let her know.  She needs to start losartan/HCTZ 50 mg/12.5 mg daily, please call in  #[phone] , 0RF Make sure she has follow up in 2 weeks. We can talk about starting a chol med at next OV.

## 2014-12-10 DIAGNOSIS — N3941 Urge incontinence: Secondary | ICD-10-CM | POA: Diagnosis not present

## 2014-12-10 NOTE — Telephone Encounter (Signed)
Mrs. Luby notified as instructed by telephone.  Prescription for Losartan/HCTZ 50-12.5 mg sent to [facility] in [place].  Follow up appointment already scheduled for 12/20/2014 at 10:30 am.

## 2014-12-10 NOTE — Assessment & Plan Note (Signed)
Eval with EKG... Slightly abnormal. Will eval with ECHO, consider stress test vs cardiology referral if occ chest pressure continuing after HTN treated.

## 2014-12-11 DIAGNOSIS — M1711 Unilateral primary osteoarthritis, right knee: Secondary | ICD-10-CM | POA: Diagnosis not present

## 2014-12-13 ENCOUNTER — Other Ambulatory Visit: Payer: Self-pay

## 2014-12-13 ENCOUNTER — Other Ambulatory Visit (INDEPENDENT_AMBULATORY_CARE_PROVIDER_SITE_OTHER): Payer: Medicare Other

## 2014-12-13 DIAGNOSIS — R0789 Other chest pain: Secondary | ICD-10-CM

## 2014-12-13 DIAGNOSIS — I10 Essential (primary) hypertension: Secondary | ICD-10-CM

## 2014-12-18 DIAGNOSIS — M1711 Unilateral primary osteoarthritis, right knee: Secondary | ICD-10-CM | POA: Diagnosis not present

## 2014-12-20 ENCOUNTER — Encounter: Payer: Self-pay | Admitting: Family Medicine

## 2014-12-20 ENCOUNTER — Ambulatory Visit (INDEPENDENT_AMBULATORY_CARE_PROVIDER_SITE_OTHER): Payer: Medicare Other | Admitting: Family Medicine

## 2014-12-20 VITALS — BP 122/70 | HR 52 | Temp 97.5°F | Ht 64.0 in | Wt 167.8 lb

## 2014-12-20 DIAGNOSIS — R0789 Other chest pain: Secondary | ICD-10-CM | POA: Diagnosis not present

## 2014-12-20 DIAGNOSIS — I519 Heart disease, unspecified: Secondary | ICD-10-CM

## 2014-12-20 DIAGNOSIS — I340 Nonrheumatic mitral (valve) insufficiency: Secondary | ICD-10-CM | POA: Diagnosis not present

## 2014-12-20 DIAGNOSIS — I5189 Other ill-defined heart diseases: Secondary | ICD-10-CM

## 2014-12-20 DIAGNOSIS — I10 Essential (primary) hypertension: Secondary | ICD-10-CM | POA: Diagnosis not present

## 2014-12-20 DIAGNOSIS — I059 Rheumatic mitral valve disease, unspecified: Secondary | ICD-10-CM | POA: Insufficient documentation

## 2014-12-20 HISTORY — DX: Heart disease, unspecified: I51.9

## 2014-12-20 LAB — BASIC METABOLIC PANEL
BUN: 17 mg/dL (ref 6–23)
CALCIUM: 9.9 mg/dL (ref 8.4–10.5)
CO2: 33 mEq/L — ABNORMAL HIGH (ref 19–32)
Chloride: 97 mEq/L (ref 96–112)
Creatinine, Ser: 0.84 mg/dL (ref 0.40–1.20)
GFR: 70.31 mL/min (ref >60.00)
Glucose, Bld: 100 mg/dL — ABNORMAL HIGH (ref 70–99)
Potassium: 4.3 mEq/L (ref 3.5–5.1)
Sodium: 132 mEq/L — ABNORMAL LOW (ref 135–145)

## 2014-12-20 NOTE — Patient Instructions (Addendum)
Stop at lab on way out. Continue current medication. Check blood pressure every now and then. Goal < 140/90.

## 2014-12-20 NOTE — Progress Notes (Signed)
74 year old female presents for 2 week follow up HTN.  Improved control on losartan HCTZ. Improved  headache, squeeze in head. Improved palpitations, no further chest pain/pressure at rest. No clear exertional pain. No swelling in ankles.  BP has not been checking it.  No SE to new medication.   BP Readings from Last 3 Encounters:  12/20/14 122/70  12/06/14 171/93  10/19/14 170/82       Review of Systems  Constitutional: Negative for fever and fatigue.  HENT: Negative for ear pain.  Eyes: Negative for pain.  Respiratory: Negative for chest tightness and shortness of breath.  Cardiovascular: Negative for chest pain, palpitations and leg swelling.  Gastrointestinal: Negative for abdominal pain.  Genitourinary: Negative for dysuria.       Objective:   Physical Exam  Constitutional: She is oriented to person, place, and time. Vital signs are normal. She appears well-developed and well-nourished. She is cooperative. Non-toxic appearance. She does not appear ill. No distress.  HENT:  Head: Normocephalic.  Right Ear: Hearing, tympanic membrane, external ear and ear canal normal. Tympanic membrane is not erythematous, not retracted and not bulging.  Left Ear: Hearing, tympanic membrane, external ear and ear canal normal. Tympanic membrane is not erythematous, not retracted and not bulging.  Nose: No mucosal edema or rhinorrhea. Right sinus exhibits no maxillary sinus tenderness and no frontal sinus tenderness. Left sinus exhibits no maxillary sinus tenderness and no frontal sinus tenderness.  Mouth/Throat: Uvula is midline, oropharynx is clear and moist and mucous membranes are normal.  Eyes: Conjunctivae, EOM and lids are normal. Pupils are equal, round, and reactive to light. Lids are everted and swept, no foreign bodies found.  Neck: Trachea normal and normal range of motion. Neck supple. Carotid bruit is not present. No thyroid mass and no thyromegaly present.   Cardiovascular: Normal rate, regular rhythm, S1 normal, S2 normal, normal heart sounds, intact distal pulses and normal pulses. Exam reveals no gallop and no friction rub.  No murmur heard. Pulmonary/Chest: Effort normal and breath sounds normal. No tachypnea. No respiratory distress. She has no decreased breath sounds. She has no wheezes. She has no rhonchi. She has no rales.  Abdominal: Soft. Normal appearance and bowel sounds are normal. There is no tenderness.  Neurological: She is alert and oriented to person, place, and time. She has normal strength and normal reflexes. No cranial nerve deficit or sensory deficit. She exhibits normal muscle tone. She displays a negative Romberg sign. Coordination and gait normal. GCS eye subscore is 4. GCS verbal subscore is 5. GCS motor subscore is 6.  Nml cerebellar exam  No papilledema  Skin: Skin is warm, dry and intact. No rash noted.  Psychiatric: She has a normal mood and affect. Her speech is normal and behavior is normal. Judgment and thought content normal. Her mood appears not anxious. Cognition and memory are normal. Cognition and memory are not impaired. She does not exhibit a depressed mood. She exhibits normal recent memory and normal remote memory.

## 2014-12-20 NOTE — Assessment & Plan Note (Signed)
Reviewed ECHO with pt. Pt states she was diagnosed with this in past as well.

## 2014-12-20 NOTE — Assessment & Plan Note (Addendum)
Improved with BP med. Will eval creatinine given on [facility].

## 2014-12-20 NOTE — Assessment & Plan Note (Signed)
Resolved with BP control. 

## 2014-12-20 NOTE — Progress Notes (Signed)
Pre visit review using our clinic review tool, if applicable. No additional management support is needed unless otherwise documented below in the visit note. 

## 2014-12-20 NOTE — Assessment & Plan Note (Signed)
Treat HTN, reviewed diagnosis in detail with pt.

## 2014-12-27 DIAGNOSIS — M1711 Unilateral primary osteoarthritis, right knee: Secondary | ICD-10-CM | POA: Diagnosis not present

## 2015-01-02 ENCOUNTER — Other Ambulatory Visit: Payer: Self-pay | Admitting: Internal Medicine

## 2015-01-03 DIAGNOSIS — T63441A Toxic effect of venom of bees, accidental (unintentional), initial encounter: Secondary | ICD-10-CM | POA: Diagnosis not present

## 2015-01-06 ENCOUNTER — Other Ambulatory Visit: Payer: Self-pay | Admitting: Family Medicine

## 2015-01-22 DIAGNOSIS — T63441A Toxic effect of venom of bees, accidental (unintentional), initial encounter: Secondary | ICD-10-CM | POA: Diagnosis not present

## 2015-02-08 DIAGNOSIS — T63441A Toxic effect of venom of bees, accidental (unintentional), initial encounter: Secondary | ICD-10-CM | POA: Diagnosis not present

## 2015-03-08 DIAGNOSIS — Z961 Presence of intraocular lens: Secondary | ICD-10-CM | POA: Diagnosis not present

## 2015-03-15 DIAGNOSIS — T63441A Toxic effect of venom of bees, accidental (unintentional), initial encounter: Secondary | ICD-10-CM | POA: Diagnosis not present

## 2015-03-18 DIAGNOSIS — M1711 Unilateral primary osteoarthritis, right knee: Secondary | ICD-10-CM | POA: Diagnosis not present

## 2015-04-03 DIAGNOSIS — M79675 Pain in left toe(s): Secondary | ICD-10-CM | POA: Diagnosis not present

## 2015-04-03 DIAGNOSIS — B351 Tinea unguium: Secondary | ICD-10-CM | POA: Diagnosis not present

## 2015-04-03 DIAGNOSIS — M19071 Primary osteoarthritis, right ankle and foot: Secondary | ICD-10-CM | POA: Diagnosis not present

## 2015-04-03 DIAGNOSIS — M79674 Pain in right toe(s): Secondary | ICD-10-CM | POA: Diagnosis not present

## 2015-04-11 DIAGNOSIS — T63441A Toxic effect of venom of bees, accidental (unintentional), initial encounter: Secondary | ICD-10-CM | POA: Diagnosis not present

## 2015-04-25 DIAGNOSIS — J4530 Mild persistent asthma, uncomplicated: Secondary | ICD-10-CM | POA: Diagnosis not present

## 2015-04-25 DIAGNOSIS — J309 Allergic rhinitis, unspecified: Secondary | ICD-10-CM | POA: Diagnosis not present

## 2015-05-15 DIAGNOSIS — T63441A Toxic effect of venom of bees, accidental (unintentional), initial encounter: Secondary | ICD-10-CM | POA: Diagnosis not present

## 2015-06-04 ENCOUNTER — Emergency Department (HOSPITAL_COMMUNITY): Payer: Medicare Other

## 2015-06-04 ENCOUNTER — Encounter (HOSPITAL_COMMUNITY): Payer: Self-pay | Admitting: Emergency Medicine

## 2015-06-04 ENCOUNTER — Observation Stay (HOSPITAL_COMMUNITY)
Admission: EM | Admit: 2015-06-04 | Discharge: 2015-06-07 | Disposition: A | Discharge status: Skilled Nursing Facility | Payer: Medicare Other | Attending: Orthopaedic Surgery | Admitting: Orthopaedic Surgery

## 2015-06-04 DIAGNOSIS — Z800 Family history of malignant neoplasm of digestive organs: Secondary | ICD-10-CM | POA: Diagnosis not present

## 2015-06-04 DIAGNOSIS — Z888 Allergy status to other drugs, medicaments and biological substances status: Secondary | ICD-10-CM | POA: Diagnosis not present

## 2015-06-04 DIAGNOSIS — K219 Gastro-esophageal reflux disease without esophagitis: Secondary | ICD-10-CM | POA: Insufficient documentation

## 2015-06-04 DIAGNOSIS — Z833 Family history of diabetes mellitus: Secondary | ICD-10-CM | POA: Insufficient documentation

## 2015-06-04 DIAGNOSIS — K589 Irritable bowel syndrome without diarrhea: Secondary | ICD-10-CM | POA: Insufficient documentation

## 2015-06-04 DIAGNOSIS — W1789XA Other fall from one level to another, initial encounter: Secondary | ICD-10-CM | POA: Insufficient documentation

## 2015-06-04 DIAGNOSIS — Z8489 Family history of other specified conditions: Secondary | ICD-10-CM | POA: Insufficient documentation

## 2015-06-04 DIAGNOSIS — S199XXA Unspecified injury of neck, initial encounter: Secondary | ICD-10-CM | POA: Diagnosis not present

## 2015-06-04 DIAGNOSIS — Z91030 Bee allergy status: Secondary | ICD-10-CM | POA: Insufficient documentation

## 2015-06-04 DIAGNOSIS — R0781 Pleurodynia: Secondary | ICD-10-CM | POA: Diagnosis not present

## 2015-06-04 DIAGNOSIS — S8011XA Contusion of right lower leg, initial encounter: Secondary | ICD-10-CM | POA: Diagnosis not present

## 2015-06-04 DIAGNOSIS — S42293A Other displaced fracture of upper end of unspecified humerus, initial encounter for closed fracture: Secondary | ICD-10-CM | POA: Diagnosis present

## 2015-06-04 DIAGNOSIS — Y999 Unspecified external cause status: Secondary | ICD-10-CM | POA: Insufficient documentation

## 2015-06-04 DIAGNOSIS — Z808 Family history of malignant neoplasm of other organs or systems: Secondary | ICD-10-CM | POA: Insufficient documentation

## 2015-06-04 DIAGNOSIS — Z8249 Family history of ischemic heart disease and other diseases of the circulatory system: Secondary | ICD-10-CM | POA: Diagnosis not present

## 2015-06-04 DIAGNOSIS — T148 Other injury of unspecified body region: Secondary | ICD-10-CM | POA: Diagnosis not present

## 2015-06-04 DIAGNOSIS — R1110 Vomiting, unspecified: Secondary | ICD-10-CM

## 2015-06-04 DIAGNOSIS — Z8042 Family history of malignant neoplasm of prostate: Secondary | ICD-10-CM | POA: Diagnosis not present

## 2015-06-04 DIAGNOSIS — S42201A Unspecified fracture of upper end of right humerus, initial encounter for closed fracture: Secondary | ICD-10-CM | POA: Diagnosis not present

## 2015-06-04 DIAGNOSIS — Z803 Family history of malignant neoplasm of breast: Secondary | ICD-10-CM | POA: Diagnosis not present

## 2015-06-04 DIAGNOSIS — M1120 Other chondrocalcinosis, unspecified site: Secondary | ICD-10-CM | POA: Insufficient documentation

## 2015-06-04 DIAGNOSIS — S42291A Other displaced fracture of upper end of right humerus, initial encounter for closed fracture: Principal | ICD-10-CM | POA: Insufficient documentation

## 2015-06-04 DIAGNOSIS — S0990XA Unspecified injury of head, initial encounter: Secondary | ICD-10-CM | POA: Diagnosis not present

## 2015-06-04 DIAGNOSIS — M79671 Pain in right foot: Secondary | ICD-10-CM | POA: Insufficient documentation

## 2015-06-04 DIAGNOSIS — Y92009 Unspecified place in unspecified non-institutional (private) residence as the place of occurrence of the external cause: Secondary | ICD-10-CM | POA: Diagnosis not present

## 2015-06-04 DIAGNOSIS — M25511 Pain in right shoulder: Secondary | ICD-10-CM | POA: Diagnosis not present

## 2015-06-04 DIAGNOSIS — S72491A Other fracture of lower end of right femur, initial encounter for closed fracture: Secondary | ICD-10-CM

## 2015-06-04 DIAGNOSIS — S42209A Unspecified fracture of upper end of unspecified humerus, initial encounter for closed fracture: Secondary | ICD-10-CM

## 2015-06-04 DIAGNOSIS — K648 Other hemorrhoids: Secondary | ICD-10-CM | POA: Diagnosis not present

## 2015-06-04 DIAGNOSIS — M79604 Pain in right leg: Secondary | ICD-10-CM | POA: Insufficient documentation

## 2015-06-04 DIAGNOSIS — M542 Cervicalgia: Secondary | ICD-10-CM | POA: Diagnosis not present

## 2015-06-04 DIAGNOSIS — S72411A Displaced unspecified condyle fracture of lower end of right femur, initial encounter for closed fracture: Secondary | ICD-10-CM

## 2015-06-04 DIAGNOSIS — M1711 Unilateral primary osteoarthritis, right knee: Secondary | ICD-10-CM | POA: Insufficient documentation

## 2015-06-04 DIAGNOSIS — R51 Headache: Secondary | ICD-10-CM | POA: Diagnosis not present

## 2015-06-04 DIAGNOSIS — Z886 Allergy status to analgesic agent status: Secondary | ICD-10-CM | POA: Insufficient documentation

## 2015-06-04 DIAGNOSIS — S2231XA Fracture of one rib, right side, initial encounter for closed fracture: Secondary | ICD-10-CM | POA: Insufficient documentation

## 2015-06-04 DIAGNOSIS — S42251A Displaced fracture of greater tuberosity of right humerus, initial encounter for closed fracture: Secondary | ICD-10-CM | POA: Diagnosis not present

## 2015-06-04 DIAGNOSIS — M419 Scoliosis, unspecified: Secondary | ICD-10-CM | POA: Insufficient documentation

## 2015-06-04 DIAGNOSIS — E781 Pure hyperglyceridemia: Secondary | ICD-10-CM | POA: Diagnosis not present

## 2015-06-04 DIAGNOSIS — G8929 Other chronic pain: Secondary | ICD-10-CM | POA: Insufficient documentation

## 2015-06-04 DIAGNOSIS — Z823 Family history of stroke: Secondary | ICD-10-CM | POA: Insufficient documentation

## 2015-06-04 DIAGNOSIS — Z9841 Cataract extraction status, right eye: Secondary | ICD-10-CM | POA: Diagnosis not present

## 2015-06-04 DIAGNOSIS — T148XXA Other injury of unspecified body region, initial encounter: Secondary | ICD-10-CM

## 2015-06-04 MED ORDER — FENTANYL CITRATE (PF) 100 MCG/2ML IJ SOLN
50.0000 ug | Freq: Once | INTRAMUSCULAR | Status: AC
  Administered 2015-06-04: 50 ug via INTRAVENOUS
  Filled 2015-06-04: qty 2

## 2015-06-04 MED ORDER — FENTANYL CITRATE (PF) 100 MCG/2ML IJ SOLN
50.0000 ug | Freq: Once | INTRAMUSCULAR | Status: AC
Start: 2015-06-04 — End: 2015-06-05
  Administered 2015-06-05: 50 ug via INTRAVENOUS
  Filled 2015-06-04: qty 2

## 2015-06-04 NOTE — ED Notes (Signed)
Pt brought to ED by [facility] after falling on her porch at home lying on the right side. Pt c/o 10/10 pain on her right shoulder and knee and some numbness on her right arm and leg. Pt hit her right side of her head having a small hematoma on arrival concern of possible dislocation on right knee. 200 mcg Fentanyl given by [facility] with some relief. Pt denies neck or back pain neck collar in place.
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
## 2015-06-05 ENCOUNTER — Inpatient Hospital Stay (HOSPITAL_COMMUNITY): Payer: Medicare Other

## 2015-06-05 DIAGNOSIS — R937 Abnormal findings on diagnostic imaging of other parts of musculoskeletal system: Secondary | ICD-10-CM | POA: Diagnosis not present

## 2015-06-05 DIAGNOSIS — R1110 Vomiting, unspecified: Secondary | ICD-10-CM | POA: Diagnosis not present

## 2015-06-05 DIAGNOSIS — S2231XA Fracture of one rib, right side, initial encounter for closed fracture: Secondary | ICD-10-CM | POA: Diagnosis not present

## 2015-06-05 DIAGNOSIS — S42291A Other displaced fracture of upper end of right humerus, initial encounter for closed fracture: Secondary | ICD-10-CM | POA: Diagnosis not present

## 2015-06-05 DIAGNOSIS — S42211A Unspecified displaced fracture of surgical neck of right humerus, initial encounter for closed fracture: Secondary | ICD-10-CM | POA: Diagnosis not present

## 2015-06-05 DIAGNOSIS — S42251A Displaced fracture of greater tuberosity of right humerus, initial encounter for closed fracture: Secondary | ICD-10-CM | POA: Diagnosis not present

## 2015-06-05 DIAGNOSIS — S42293A Other displaced fracture of upper end of unspecified humerus, initial encounter for closed fracture: Secondary | ICD-10-CM | POA: Diagnosis present

## 2015-06-05 DIAGNOSIS — M419 Scoliosis, unspecified: Secondary | ICD-10-CM | POA: Diagnosis not present

## 2015-06-05 LAB — BASIC METABOLIC PANEL
Anion gap: 10 (ref 5–15)
BUN: 17 mg/dL (ref 6–20)
CHLORIDE: 103 mmol/L (ref 101–111)
CO2: 25 mmol/L (ref 22–32)
Calcium: 9.1 mg/dL (ref 8.9–10.3)
Creatinine, Ser: 0.76 mg/dL (ref 0.44–1.00)
Glucose, Bld: 145 mg/dL — ABNORMAL HIGH (ref 65–99)
Potassium: 4.3 mmol/L (ref 3.5–5.1)
Sodium: 138 mmol/L (ref 135–145)

## 2015-06-05 LAB — CBC
HCT: 37.1 % (ref 36.0–46.0)
HEMOGLOBIN: 12.6 g/dL (ref 12.0–15.0)
MCH: 31.3 pg (ref 26.0–34.0)
MCHC: 34 g/dL (ref 30.0–36.0)
MCV: 92.3 fL (ref 78.0–100.0)
PLATELETS: 237 10*3/uL (ref 150–400)
RBC: 4.02 MIL/uL (ref 3.87–5.11)
RDW: 13.1 % (ref 11.5–15.5)
WBC: 14.7 10*3/uL — ABNORMAL HIGH (ref 4.0–10.5)

## 2015-06-05 LAB — URINE MICROSCOPIC-ADD ON

## 2015-06-05 LAB — URINALYSIS, ROUTINE W REFLEX MICROSCOPIC
Bilirubin Urine: NEGATIVE
Glucose, UA: NEGATIVE mg/dL
Hgb urine dipstick: NEGATIVE
KETONES UR: NEGATIVE mg/dL
NITRITE: NEGATIVE
PROTEIN: NEGATIVE mg/dL
Specific Gravity, Urine: 1.027 (ref 1.005–1.030)
Urobilinogen, UA: 0.2 mg/dL (ref 0.0–1.0)
pH: 6.5 (ref 5.0–8.0)

## 2015-06-05 MED ORDER — HYDROCODONE-ACETAMINOPHEN 7.5-325 MG PO TABS
1.0000 | ORAL_TABLET | ORAL | Status: DC | PRN
  Administered 2015-06-05 – 2015-06-06 (×5): 1 via ORAL
  Filled 2015-06-05 (×4): qty 1

## 2015-06-05 MED ORDER — ONDANSETRON HCL 4 MG/2ML IJ SOLN
4.0000 mg | Freq: Four times a day (QID) | INTRAMUSCULAR | Status: DC | PRN
  Administered 2015-06-05: 4 mg via INTRAVENOUS
  Filled 2015-06-05: qty 2

## 2015-06-05 MED ORDER — METOCLOPRAMIDE HCL 5 MG/ML IJ SOLN
5.0000 mg | Freq: Four times a day (QID) | INTRAMUSCULAR | Status: DC
  Administered 2015-06-05 – 2015-06-07 (×7): 5 mg via INTRAVENOUS
  Filled 2015-06-05 (×8): qty 2

## 2015-06-05 MED ORDER — HYDROMORPHONE HCL 1 MG/ML IJ SOLN
0.5000 mg | INTRAMUSCULAR | Status: DC | PRN
  Administered 2015-06-05 (×2): 0.5 mg via INTRAVENOUS
  Filled 2015-06-05 (×3): qty 1

## 2015-06-05 MED ORDER — OXYCODONE-ACETAMINOPHEN 5-325 MG PO TABS
2.0000 | ORAL_TABLET | Freq: Four times a day (QID) | ORAL | Status: DC | PRN
  Administered 2015-06-05 (×2): 2 via ORAL
  Filled 2015-06-05: qty 2

## 2015-06-05 NOTE — Progress Notes (Signed)
Orthopedic Tech Progress Note Patient Details:  Sherri Murray 15-Dec-1940 [id number]  Ortho Devices Type of Ortho Device: Arm sling, Knee Immobilizer Ortho Device/Splint Location: RUE, RLE Ortho Device/Splint Interventions: Application   Asia R Thompson 06/05/2015, 12:36 AM

## 2015-06-05 NOTE — Progress Notes (Signed)
Patient ID: Sherri Murray, female   DOB: 08/17/1941, 74 y.o.   MRN: [id number]  Spoke with RN.  States that patient is having "projectile vomiting".  Will d/c dilaudid and percocet.  norco ordered.  Will also get stat KUB.

## 2015-06-05 NOTE — Progress Notes (Signed)
Patient is having spells of nausea called provider for PRN order of antiaumeticmedication left message awaiting call back.
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
## 2015-06-05 NOTE — Progress Notes (Signed)
Patient has had several episodes of nausea resulting in projectile vomiting received new orders for pain medications and antiemetics.

## 2015-06-05 NOTE — Progress Notes (Signed)
Rounding and nursing requested a medication for nausea associated with narcotics.  Prescribed zofran and reglan IV prn.
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
## 2015-06-06 DIAGNOSIS — S42291A Other displaced fracture of upper end of right humerus, initial encounter for closed fracture: Secondary | ICD-10-CM | POA: Diagnosis not present

## 2015-06-06 MED ORDER — HYDROCODONE-ACETAMINOPHEN 7.5-325 MG PO TABS
1.0000 | ORAL_TABLET | ORAL | Status: DC | PRN
  Administered 2015-06-06: 1 via ORAL
  Administered 2015-06-06 – 2015-06-07 (×5): 2 via ORAL
  Filled 2015-06-06: qty 2
  Filled 2015-06-06: qty 1
  Filled 2015-06-06 (×4): qty 2

## 2015-06-06 NOTE — Evaluation (Signed)
Occupational Therapy Evaluation Patient Details Name: Sherri Murray MRN: [id number] DOB: 11-Dec-1940 Today's Date: 06/06/2015    History of Present Illness 74 yo female s/p mechanical fall. Pt with right humeral head fracture, right knee injury DJD, right nondisplaced 6th rib fracture. Non op Tx of right prox humerus fx unless pain is persistant problem. She has comminuted fracture but it is in satisfactory position at this time. If it shifts then surgery will be needed.    Clinical Impression   Patient presenting with decreased ADL, IADL, functional mobility independence and deconditioning secondary to recent fall and fx.  Patient independent PTA. Patient currently requires up to total assist for ADLs and mod assist for sit<>stands and functional transfers. Patient will benefit from acute OT to increase overall independence in the areas of ADLs, functional mobility, and overall safety in order to safely discharge to venue listed below.   Need clarification orders for RUE. Weight bearing status? ROM restrictions/limitations? Sling wearing schedule?      Follow Up Recommendations  SNF;Supervision/Assistance - 24 hour    Equipment Recommendations  Other (comment) (TBD next venue of care)    Recommendations for Other Services   None at this time   Precautions / Restrictions Precautions Precautions: Fall Precaution Comments: need clarificiation on shoulder precautions/limitations/restrictions  Required Braces or Orthoses: Knee Immobilizer - Right;Sling Knee Immobilizer - Right: On at all times Restrictions Weight Bearing Restrictions: Yes RUE Weight Bearing: Non weight bearing      Mobility Bed Mobility Overal bed mobility: Needs Assistance Bed Mobility: Supine to Sit     Supine to sit: Mod assist;+2 for physical assistance     General bed mobility comments: Assistance needed for BLEs and trunk control to pull hips > EOB  Transfers Overall transfer level: Needs  assistance Equipment used: Rolling walker (2 wheeled) (used as a stabilzation for LUE, no weight put on RUE (UE in sling)) Transfers: Sit to/from Stand Sit to Stand: Mod assist;+2 physical assistance         General transfer comment: Mod assist to lift/lower from EOB. Pt holding onto RW with LUE. Cues for hand placement and technique.     Balance Overall balance assessment: Needs assistance Sitting-balance support: Single extremity supported;Feet supported Sitting balance-Leahy Scale: Fair     Standing balance support: No upper extremity supported;During functional activity Standing balance-Leahy Scale: Poor    ADL Overall ADL's : Needs assistance/impaired General ADL Comments: Pt overall total assist for ADLs due to immobilization of RUE. Pt with KI -> RLE and orders state for KI to be donned at all times.Pt requires mod assist +2 for [facility] transfers and other functional transfers at this time.     Pertinent Vitals/Pain Pain Assessment: Faces Faces Pain Scale: Hurts even more Pain Location: right knee and right shoulder Pain Descriptors / Indicators: Aching;Guarding Pain Intervention(s): Limited activity within patient's tolerance;Monitored during session;Relaxation;Repositioned (positioned with pillows)     Hand Dominance Right   Extremity/Trunk Assessment Upper Extremity Assessment Upper Extremity Assessment: RUE deficits/detail RUE Deficits / Details: Immobilized secondary to recent fx, unsure of mobilization restricitions. Left UE in sling and encouraged no movement or weight bearing in RUE RUE: Unable to fully assess due to immobilization   Lower Extremity Assessment Lower Extremity Assessment: Defer to PT evaluation   Cervical / Trunk Assessment Cervical / Trunk Assessment: Normal   Communication Communication Communication: No difficulties   Cognition Arousal/Alertness: Awake/alert Behavior During Therapy: WFL for tasks assessed/performed Overall Cognitive  Status: Within Functional Limits for tasks  assessed              Home Living Family/patient expects to be discharged to:: Private residence Living Arrangements: Spouse/significant other Available Help at Discharge: Family (boyfriend is in w/c) Type of Home: House Home Access: Stairs to enter [employer] of Steps: 1-5, depending on how she goes inside Entrance Stairs-Rails: Can reach both;Right;Left Home Layout: One level     Bathroom Shower/Tub: Walk-in shower;Tub/shower unit;Curtain   Bathroom Toilet: Standard     Home Equipment: None    Prior Functioning/Environment Level of Independence: Independent     OT Diagnosis: Generalized weakness;Acute pain   OT Problem List: Decreased strength;Decreased range of motion;Decreased activity tolerance;Impaired balance (sitting and/or standing);Decreased coordination;Decreased safety awareness;Decreased knowledge of use of DME or AE;Decreased knowledge of precautions;Pain   OT Treatment/Interventions: Self-care/ADL training;Therapeutic exercise;Energy conservation;DME and/or AE instruction;Therapeutic activities;Patient/family education;Balance training    OT Goals(Current goals can be found in the care plan section) Acute Rehab OT Goals Patient Stated Goal: go to rehab OT Goal Formulation: With patient/family Time For Goal Achievement: 06/20/15 Potential to Achieve Goals: Good ADL Goals Pt Will Perform Grooming: with min assist;standing Pt Will Perform Upper Body Bathing: sitting;with min assist (no shoulder movement) Pt Will Perform Upper Body Dressing: with max assist;sitting (no shoulder movement) Pt Will Transfer to Toilet: with min assist;bedside commode;ambulating Pt Will Perform Toileting - Clothing Manipulation and hygiene: with min assist;sit to/from stand  OT Frequency: Min 2X/week   Barriers to D/C: Decreased caregiver support          Co-evaluation PT/OT/SLP Co-Evaluation/Treatment: Yes Reason for  Co-Treatment: Complexity of the patient's impairments (multi-system involvement);For patient/therapist safety   OT goals addressed during session: ADL's and self-care;Strengthening/ROM      End of Session Equipment Utilized During Treatment: Rolling walker;Right knee immobilizer (for stablization using LUE during sit<>stand)  Activity Tolerance: Patient tolerated treatment well Patient left: in chair;with call bell/phone within reach;with family/visitor present   Time: 6226-3335 OT Time Calculation (min): 31 min Charges:  OT General Charges $OT Visit: 1 Procedure OT Evaluation $Initial OT Evaluation Tier I: 1 Procedure  Keeton Kassebaum , MS, OTR/L, CLT Pager: [phone]  06/06/2015, 3:51 PM

## 2015-06-06 NOTE — Evaluation (Signed)
Physical Therapy Evaluation Patient Details Name: Sherri Murray MRN: [id number] DOB: 02/07/1941 Today's Date: 06/06/2015   History of Present Illness  74 yo female s/p mechanical fall. Pt with right humeral head fracture, right knee injury DJD, right nondisplaced 6th rib fracture. Non op Tx of right prox humerus fx unless pain is persistant problem. She has comminuted fracture but it is in satisfactory position at this time. If it shifts then surgery will be needed.   Clinical Impression  Patient presents with pain, weakness, NWB RUE and KI on RLE s/p mechanical fall impacting safe mobility. Pt independent PTA and now requires Mod A of 2 for all mobility. Not safe to return home at this time. Would benefit from [facility] SNF to maximize independence and mobility prior to return home. Will follow acutely.     Follow Up Recommendations SNF;Supervision/Assistance - 24 hour    Equipment Recommendations  Other (comment) (TBD.)    Recommendations for Other Services       Precautions / Restrictions Precautions Precautions: Fall Precaution Comments: need clarificiation on shoulder precautions/limitations/restrictions  Required Braces or Orthoses: Knee Immobilizer - Right;Sling Knee Immobilizer - Right: On at all times Restrictions Weight Bearing Restrictions: Yes RUE Weight Bearing: Non weight bearing      Mobility  Bed Mobility Overal bed mobility: Needs Assistance Bed Mobility: Supine to Sit     Supine to sit: Mod assist;+2 for physical assistance;HOB elevated     General bed mobility comments: Assistance needed for BLEs and trunk control to pull hips > EOB  Transfers Overall transfer level: Needs assistance Equipment used: Rolling walker (2 wheeled) Transfers: Sit to/from Stand Sit to Stand: Mod assist;+2 physical assistance         General transfer comment: Mod assist to lift/lower from EOB. Pt holding onto RW with LUE. Cues for hand placement and technique.    Ambulation/Gait Ambulation/Gait assistance: Min assist;+2 physical assistance Ambulation Distance (Feet): 4 Feet Assistive device: 1 person hand held assist Gait Pattern/deviations: Step-to pattern;Decreased stride length;Shuffle;Decreased stance time - right;Decreased step length - left     General Gait Details: Pt able to take a few steps to chair with Min A of 2 (HHA of 1). Posterior lean. Unsteady. Fatigues.  Stairs            Wheelchair Mobility    Modified Rankin (Stroke Patients Only)       Balance Overall balance assessment: Needs assistance Sitting-balance support: Feet supported;Single extremity supported Sitting balance-Leahy Scale: Fair     Standing balance support: During functional activity Standing balance-Leahy Scale: Poor Standing balance comment: Reilent on RW or external support for stability.                              Pertinent Vitals/Pain Pain Assessment: Faces Faces Pain Scale: Hurts even more Pain Location: right knee, right shoulder Pain Descriptors / Indicators: Guarding;Aching;Sore Pain Intervention(s): Monitored during session;Limited activity within patient's tolerance;Repositioned;Relaxation    Home Living Family/patient expects to be discharged to:: Private residence Living Arrangements: Spouse/significant other Available Help at Discharge: Family (B/f is in w/c.) Type of Home: House Home Access: Stairs to enter Entrance Stairs-Rails: Can reach both;Right;Left Entrance Stairs-Number of Steps: 1-5, depending on how she goes inside Home Layout: One level Home Equipment: None      Prior Function Level of Independence: Independent               Hand Dominance   Dominant Hand:  Right    Extremity/Trunk Assessment   Upper Extremity Assessment: Defer to OT evaluation RUE Deficits / Details: Immobilized secondary to recent fx, unsure of mobilization restricitions. Left UE in sling and encouraged no movement  or weight bearing in RUE RUE: Unable to fully assess due to immobilization       Lower Extremity Assessment: Generalized weakness;RLE deficits/detail;LLE deficits/detail RLE Deficits / Details: AROM not fully assessed secondary to LE in [place]. Able to mobilize ankles. Encouraged quad sets.  LLE Deficits / Details: Grossly ~3/5 throughout.  Cervical / Trunk Assessment: Normal  Communication   Communication: No difficulties  Cognition Arousal/Alertness: Awake/alert Behavior During Therapy: WFL for tasks assessed/performed Overall Cognitive Status: Within Functional Limits for tasks assessed                      General Comments      Exercises Total Joint Exercises Ankle Circles/Pumps: Both;10 reps;Seated Quad Sets: Both;10 reps;Seated      Assessment/Plan    PT Assessment Patient needs continued PT services  PT Diagnosis Difficulty walking;Acute pain;Generalized weakness   PT Problem List Decreased strength;Pain;Decreased range of motion;Decreased activity tolerance;Decreased balance;Decreased mobility;Decreased knowledge of precautions;Decreased knowledge of use of DME  PT Treatment Interventions Balance training;Gait training;Functional mobility training;Therapeutic activities;Therapeutic exercise;Patient/family education;Stair training;DME instruction   PT Goals (Current goals can be found in the Care Plan section) Acute Rehab PT Goals Patient Stated Goal: go to rehab PT Goal Formulation: With patient Time For Goal Achievement: 06/20/15 Potential to Achieve Goals: Fair    Frequency Min 3X/week   Barriers to discharge Decreased caregiver support;Inaccessible home environment      Co-evaluation PT/OT/SLP Co-Evaluation/Treatment: Yes Reason for Co-Treatment: Complexity of the patient's impairments (multi-system involvement) PT goals addressed during session: Mobility/safety with mobility;Strengthening/ROM OT goals addressed during session: ADL's and  self-care;Strengthening/ROM       End of Session Equipment Utilized During Treatment: Gait belt;Other (comment) (sling RUE, KI RLE) Activity Tolerance: Patient limited by pain;Patient tolerated treatment well Patient left: in chair;with call bell/phone within reach;with family/visitor present Nurse Communication: Mobility status;Weight bearing status;Precautions         Time: 0109-3235 PT Time Calculation (min) (ACUTE ONLY): 19 min   Charges:   PT Evaluation $Initial PT Evaluation Tier I: 1 Procedure     PT G Codes:        Rylynn Kobs A Sarina Robleto 06/06/2015, 4:10 PM Wray Kearns, [place], DPT [phone]

## 2015-06-06 NOTE — Progress Notes (Signed)
Subjective:     Patient reports pain as 7 on 0-10 scale.    Objective: Vital signs in last 24 hours: Temp:  [97.8 F (36.6 Murray)-98.9 F (37.2 Murray)] 98.9 F (37.2 Murray) (08/25 0426) Pulse Rate:  [55-65] 61 (08/25 0426) Resp:  [16] 16 (08/25 0426) BP: (114-124)/(61-63) 124/61 mmHg (08/25 0426) SpO2:  [93 %-96 %] 95 % (08/25 0426)  Intake/Output from previous day: 08/24 0701 - 08/25 0700 In: 600 [P.O.:600] Out: -  Intake/Output this shift:     Recent Labs  06/04/15 0018  HGB 12.6    Recent Labs  06/04/15 0018  WBC 14.7*  RBC 4.02  HCT 37.1  PLT 237    Recent Labs  06/04/15 0018  NA 138  K 4.3  CL 103  CO2 25  BUN 17  CREATININE 0.76  GLUCOSE 145*  CALCIUM 9.1   No results for input(s): LABPT, INR in the last 72 hours.  Neurologically intact  Assessment/Plan:     Up with therapy  SNF likely.   Non op Tx of right prox humerus fx unless pain is persistant problem. She has comminuted fracture but it is in satisfactory position at this time.  If it shifts then surgery will be needed.   Sherri Murray 06/06/2015, 7:59 AM

## 2015-06-07 DIAGNOSIS — M25529 Pain in unspecified elbow: Secondary | ICD-10-CM | POA: Diagnosis not present

## 2015-06-07 DIAGNOSIS — M1120 Other chondrocalcinosis, unspecified site: Secondary | ICD-10-CM | POA: Diagnosis not present

## 2015-06-07 DIAGNOSIS — Z862 Personal history of diseases of the blood and blood-forming organs and certain disorders involving the immune mechanism: Secondary | ICD-10-CM | POA: Diagnosis not present

## 2015-06-07 DIAGNOSIS — Y9289 Other specified places as the place of occurrence of the external cause: Secondary | ICD-10-CM | POA: Diagnosis not present

## 2015-06-07 DIAGNOSIS — M62838 Other muscle spasm: Secondary | ICD-10-CM | POA: Diagnosis not present

## 2015-06-07 DIAGNOSIS — M419 Scoliosis, unspecified: Secondary | ICD-10-CM | POA: Diagnosis not present

## 2015-06-07 DIAGNOSIS — K648 Other hemorrhoids: Secondary | ICD-10-CM | POA: Diagnosis not present

## 2015-06-07 DIAGNOSIS — M21729 Unequal limb length (acquired), unspecified humerus: Secondary | ICD-10-CM | POA: Diagnosis not present

## 2015-06-07 DIAGNOSIS — Y998 Other external cause status: Secondary | ICD-10-CM | POA: Diagnosis not present

## 2015-06-07 DIAGNOSIS — Z8249 Family history of ischemic heart disease and other diseases of the circulatory system: Secondary | ICD-10-CM | POA: Diagnosis not present

## 2015-06-07 DIAGNOSIS — E781 Pure hyperglyceridemia: Secondary | ICD-10-CM | POA: Diagnosis not present

## 2015-06-07 DIAGNOSIS — Z803 Family history of malignant neoplasm of breast: Secondary | ICD-10-CM | POA: Diagnosis not present

## 2015-06-07 DIAGNOSIS — K589 Irritable bowel syndrome without diarrhea: Secondary | ICD-10-CM | POA: Diagnosis not present

## 2015-06-07 DIAGNOSIS — Z823 Family history of stroke: Secondary | ICD-10-CM | POA: Diagnosis not present

## 2015-06-07 DIAGNOSIS — S72051A Unspecified fracture of head of right femur, initial encounter for closed fracture: Secondary | ICD-10-CM | POA: Diagnosis not present

## 2015-06-07 DIAGNOSIS — M6281 Muscle weakness (generalized): Secondary | ICD-10-CM | POA: Diagnosis not present

## 2015-06-07 DIAGNOSIS — M25569 Pain in unspecified knee: Secondary | ICD-10-CM | POA: Diagnosis not present

## 2015-06-07 DIAGNOSIS — I10 Essential (primary) hypertension: Secondary | ICD-10-CM | POA: Diagnosis not present

## 2015-06-07 DIAGNOSIS — S2231XD Fracture of one rib, right side, subsequent encounter for fracture with routine healing: Secondary | ICD-10-CM | POA: Diagnosis not present

## 2015-06-07 DIAGNOSIS — S42291A Other displaced fracture of upper end of right humerus, initial encounter for closed fracture: Secondary | ICD-10-CM | POA: Diagnosis not present

## 2015-06-07 DIAGNOSIS — Z9181 History of falling: Secondary | ICD-10-CM | POA: Diagnosis not present

## 2015-06-07 DIAGNOSIS — Y9389 Activity, other specified: Secondary | ICD-10-CM | POA: Diagnosis not present

## 2015-06-07 DIAGNOSIS — Z79899 Other long term (current) drug therapy: Secondary | ICD-10-CM | POA: Diagnosis not present

## 2015-06-07 DIAGNOSIS — T7840XA Allergy, unspecified, initial encounter: Secondary | ICD-10-CM | POA: Diagnosis not present

## 2015-06-07 DIAGNOSIS — M542 Cervicalgia: Secondary | ICD-10-CM | POA: Diagnosis not present

## 2015-06-07 DIAGNOSIS — L299 Pruritus, unspecified: Secondary | ICD-10-CM | POA: Diagnosis not present

## 2015-06-07 DIAGNOSIS — Z8042 Family history of malignant neoplasm of prostate: Secondary | ICD-10-CM | POA: Diagnosis not present

## 2015-06-07 DIAGNOSIS — Z9841 Cataract extraction status, right eye: Secondary | ICD-10-CM | POA: Diagnosis not present

## 2015-06-07 DIAGNOSIS — R110 Nausea: Secondary | ICD-10-CM | POA: Diagnosis not present

## 2015-06-07 DIAGNOSIS — Z4789 Encounter for other orthopedic aftercare: Secondary | ICD-10-CM | POA: Diagnosis not present

## 2015-06-07 DIAGNOSIS — M1711 Unilateral primary osteoarthritis, right knee: Secondary | ICD-10-CM | POA: Diagnosis not present

## 2015-06-07 DIAGNOSIS — M25539 Pain in unspecified wrist: Secondary | ICD-10-CM | POA: Diagnosis not present

## 2015-06-07 DIAGNOSIS — S42291S Other displaced fracture of upper end of right humerus, sequela: Secondary | ICD-10-CM | POA: Diagnosis not present

## 2015-06-07 DIAGNOSIS — Z800 Family history of malignant neoplasm of digestive organs: Secondary | ICD-10-CM | POA: Diagnosis not present

## 2015-06-07 DIAGNOSIS — R278 Other lack of coordination: Secondary | ICD-10-CM | POA: Diagnosis not present

## 2015-06-07 DIAGNOSIS — K219 Gastro-esophageal reflux disease without esophagitis: Secondary | ICD-10-CM | POA: Diagnosis not present

## 2015-06-07 DIAGNOSIS — S42291D Other displaced fracture of upper end of right humerus, subsequent encounter for fracture with routine healing: Secondary | ICD-10-CM | POA: Diagnosis not present

## 2015-06-07 DIAGNOSIS — Z833 Family history of diabetes mellitus: Secondary | ICD-10-CM | POA: Diagnosis not present

## 2015-06-07 DIAGNOSIS — Z8489 Family history of other specified conditions: Secondary | ICD-10-CM | POA: Diagnosis not present

## 2015-06-07 DIAGNOSIS — Z7951 Long term (current) use of inhaled steroids: Secondary | ICD-10-CM | POA: Diagnosis not present

## 2015-06-07 DIAGNOSIS — S72051D Unspecified fracture of head of right femur, subsequent encounter for closed fracture with routine healing: Secondary | ICD-10-CM | POA: Diagnosis not present

## 2015-06-07 DIAGNOSIS — Z7952 Long term (current) use of systemic steroids: Secondary | ICD-10-CM | POA: Diagnosis not present

## 2015-06-07 DIAGNOSIS — S2231XA Fracture of one rib, right side, initial encounter for closed fracture: Secondary | ICD-10-CM | POA: Diagnosis not present

## 2015-06-07 DIAGNOSIS — M79671 Pain in right foot: Secondary | ICD-10-CM | POA: Diagnosis not present

## 2015-06-07 DIAGNOSIS — R2681 Unsteadiness on feet: Secondary | ICD-10-CM | POA: Diagnosis not present

## 2015-06-07 DIAGNOSIS — M79604 Pain in right leg: Secondary | ICD-10-CM | POA: Diagnosis not present

## 2015-06-07 DIAGNOSIS — Z808 Family history of malignant neoplasm of other organs or systems: Secondary | ICD-10-CM | POA: Diagnosis not present

## 2015-06-07 DIAGNOSIS — M1990 Unspecified osteoarthritis, unspecified site: Secondary | ICD-10-CM | POA: Diagnosis not present

## 2015-06-07 DIAGNOSIS — R0781 Pleurodynia: Secondary | ICD-10-CM | POA: Diagnosis not present

## 2015-06-07 DIAGNOSIS — Z8639 Personal history of other endocrine, nutritional and metabolic disease: Secondary | ICD-10-CM | POA: Diagnosis not present

## 2015-06-07 DIAGNOSIS — G8929 Other chronic pain: Secondary | ICD-10-CM | POA: Diagnosis not present

## 2015-06-07 DIAGNOSIS — M25511 Pain in right shoulder: Secondary | ICD-10-CM | POA: Diagnosis not present

## 2015-06-07 DIAGNOSIS — Z888 Allergy status to other drugs, medicaments and biological substances status: Secondary | ICD-10-CM | POA: Diagnosis not present

## 2015-06-07 MED ORDER — METOCLOPRAMIDE HCL 5 MG PO TABS
5.0000 mg | ORAL_TABLET | Freq: Four times a day (QID) | ORAL | Status: DC
  Administered 2015-06-07: 5 mg via ORAL
  Filled 2015-06-07: qty 1

## 2015-06-07 MED ORDER — HYDROCODONE-ACETAMINOPHEN 7.5-325 MG PO TABS: 1.0000 | ORAL_TABLET | ORAL | Status: DC | PRN

## 2015-06-07 MED ORDER — METHOCARBAMOL 500 MG PO TABS: 500.0000 mg | ORAL_TABLET | Freq: Four times a day (QID) | ORAL | Status: DC

## 2015-06-07 NOTE — Progress Notes (Signed)
Physical Therapy Treatment Patient Details Name: Sherri Murray MRN: [id number] DOB: 28-Apr-1941 Today's Date: 06/07/2015    History of Present Illness 74 yo female s/p mechanical fall. Pt with right humeral head fracture, right knee injury DJD, right nondisplaced 6th rib fracture. Non op Tx of right prox humerus fx unless pain is persistant problem. She has comminuted fracture but it is in satisfactory position at this time. If it shifts then surgery will be needed.     PT Comments    Patient progressing slowly towards PT goals. Reports increased pain in right knee since yesterday impacting mobility. Requires Mod assist of 2 for standing/ambulation due to pain in RLE and RUE in sling. Agreeable to transfer to chair. Reviewed edema management/AROM RUE. Appropriate for [facility]. Will follow acutely.   Follow Up Recommendations  SNF;Supervision/Assistance - 24 hour     Equipment Recommendations  Other (comment) (TBD.)    Recommendations for Other Services       Precautions / Restrictions Precautions Precautions: Fall Precaution Comments: need clarificiation on shoulder precautions/limitations/restrictions  Required Braces or Orthoses: Knee Immobilizer - Right;Sling Knee Immobilizer - Right: On at all times Restrictions Weight Bearing Restrictions: Yes RUE Weight Bearing: Non weight bearing    Mobility  Bed Mobility Overal bed mobility: Needs Assistance Bed Mobility: Supine to Sit     Supine to sit: Min assist;HOB elevated     General bed mobility comments: Min A to elevate trunk. Able to move BLEs to EOB and scoot bottom without assist.   Transfers Overall transfer level: Needs assistance Equipment used: 1 person hand held assist Transfers: Sit to/from Stand Sit to Stand: Mod assist;+2 physical assistance         General transfer comment: Mod assist to lift/lower from EOB. Cues for hand placement and technique and anterior weight shift. Stood from [employer], from [facility]  x1.   Ambulation/Gait Ambulation/Gait assistance: Mod assist Ambulation Distance (Feet): 6 Feet (+ 8') Assistive device: 1 person hand held assist Gait Pattern/deviations: Step-to pattern;Decreased stride length;Shuffle;Decreased stance time - right;Decreased step length - left   Gait velocity interpretation: <1.8 ft/sec, indicative of risk for recurrent falls General Gait Details: Pt with difficulty placing weight through RLE due to pain. Mod A (HHA of 1) for support/balance. 1 seated rest break due to fatigue. Close chair follow.   Stairs            Wheelchair Mobility    Modified Rankin (Stroke Patients Only)       Balance Overall balance assessment: Needs assistance Sitting-balance support: Feet supported;Single extremity supported Sitting balance-Leahy Scale: Fair     Standing balance support: During functional activity Standing balance-Leahy Scale: Poor                      Cognition Arousal/Alertness: Awake/alert Behavior During Therapy: WFL for tasks assessed/performed Overall Cognitive Status: Within Functional Limits for tasks assessed                      Exercises General Exercises - Lower Extremity Long Arc Quad: Left;15 reps;Seated Other Exercises Other Exercises: Encouraged AROM digits/wrist    General Comments General comments (skin integrity, edema, etc.): Daughter present during session.      Pertinent Vitals/Pain Pain Assessment: Faces Faces Pain Scale: Hurts whole lot Pain Location: right knee Pain Descriptors / Indicators: Guarding;Sore;Aching;Sharp Pain Intervention(s): Monitored during session;Premedicated before session;Repositioned    Home Living  Prior Function            PT Goals (current goals can now be found in the care plan section) Progress towards PT goals: Progressing toward goals    Frequency  Min 3X/week    PT Plan Current plan remains appropriate     Co-evaluation             End of Session Equipment Utilized During Treatment: Gait belt;Other (comment) (sling, RLE KI) Activity Tolerance: Patient limited by pain;Patient tolerated treatment well Patient left: in chair;with call bell/phone within reach;with family/visitor present     Time: 1002-1030 PT Time Calculation (min) (ACUTE ONLY): 28 min  Charges:  $Gait Training: 8-22 mins $Therapeutic Activity: 8-22 mins                    G Codes:      Makel Mcmann A Hannan Tetzlaff 06/07/2015, 12:03 PM Wray Kearns, [place], DPT [phone]
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
## 2015-06-07 NOTE — Discharge Instructions (Signed)
Shoulder immobilizer must be on at all times right upper extremity.  Do not raise arm.  No lifting of any objects. Knee immobilizer on right lower extremity when ambulating. WBAT in knee immobilizer.  Obviously cannot use a walker.

## 2015-06-07 NOTE — Progress Notes (Signed)
Medicare notification given to patient, signed copy placed on chart and claim put in system.

## 2015-06-07 NOTE — Clinical Social Work Note (Signed)
Clinical Social Work Assessment  Patient Details  Name: Sherri Murray MRN: [id number] Date of Birth: 06-Feb-1941  Date of referral:  06/07/15               Reason for consult:  Facility Placement, Discharge Planning                Permission sought to share information with:  Chartered certified accountant granted to share information::  Yes, Verbal Permission Granted  Name::     n/a  Agency::  [facility]  Relationship::  n/a  Contact Information:  n/a  Housing/Transportation Living arrangements for the past 2 months:  Single Family Home Source of Information:  Patient Patient Interpreter Needed:  None Criminal Activity/Legal Involvement Pertinent to Current Situation/Hospitalization:  No - Comment as needed Significant Relationships:  Adult Children Lives with:  Self Do you feel safe going back to the place where you live?  No (High fall risk.) Need for family participation in patient care:  No (Coment) (Patient able to make own decisions.)  Care giving concerns:  Patient expressed no concerns at this time.   Social Worker assessment / plan:  CSW received referral for possible SNF placement at time. CSW met with patient at bedside to discuss discharge disposition. Patient understanding and agreeable to PT recommendation for SNF placement at time of discharge. Patient preference is for [facility]. CSW to continue to follow and assist with discharge.  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  Patient/Family's Understanding of and Emotional Response to Diagnosis, Current Treatment, and Prognosis:  Patient understanding and agreeable to CSW plan of care.  Emotional Assessment Appearance:  Appears  stated age Attitude/Demeanor/Rapport:  Other (Pleasant) Affect (typically observed):  Accepting, Appropriate, Pleasant Orientation:  Oriented to Self, Oriented to Place, Oriented to  Time, Oriented to Situation Alcohol / Substance use:  Not Applicable Psych involvement (Current and /or in the community):  No (Comment) (Not appropriate on this admission.)  Discharge Needs  Concerns to be addressed:  No discharge needs identified Readmission within the last 30 days:  No Current discharge risk:  None Barriers to Discharge:  No Barriers Identified   Caroline Sauger, LCSW 06/07/2015, 1:22 PM [phone]

## 2015-06-07 NOTE — Discharge Planning (Signed)
Patient to be discharged to [facility]. Patient updated regarding discharge.  Facility: [facility] RN report number: [phone] Transportation: EMS ([facility])  Barrier to discharge: FL2 not signed. Awaiting signature.  Lubertha Sayres, [place] Clinical Social Work Department Orthopedics [phone]) and Surgical [phone])

## 2015-06-07 NOTE — Clinical Social Work Placement (Signed)
   CLINICAL SOCIAL WORK PLACEMENT  NOTE  Date:  06/07/2015  Patient Details  Name: Sherri Murray MRN: [id number] Date of Birth: May 10, 1941  Clinical Social Work is seeking post-discharge placement for this patient at the [place] level of care (*CSW will initial, date and re-position this form in  chart as items are completed):  Yes   Patient/family provided with [place] Work Department's list of facilities offering this level of care within the geographic area requested by the patient (or if unable, by the patient's family).  Yes   Patient/family informed of their freedom to choose among providers that offer the needed level of care, that participate in Medicare, Medicaid or managed care program needed by the patient, have an available bed and are willing to accept the patient.  Yes   Patient/family informed of [place]'s ownership interest in [facility] and [facility], as well as of the fact that they are under no obligation to receive care at these facilities.  PASRR submitted to EDS on 06/07/15     PASRR number received on 06/07/15     Existing PASRR number confirmed on  (n/a)     FL2 transmitted to all facilities in geographic area requested by pt/family on 06/07/15     FL2 transmitted to all facilities within larger geographic area on  (n/a)     Patient informed that his/her managed care company has contracts with or will negotiate with certain facilities, including the following:   (yes, [facility])     Yes   Patient/family informed of bed offers received.  Patient chooses bed at [facility]     Physician recommends and patient chooses bed at  (n/a)    Patient to be transferred to [facility] on 06/07/15.  Patient to be transferred to facility by [facility]     Patient family notified on 06/07/15 of transfer.  Name of family member notified:  Patient updated at bedside.     PHYSICIAN       Additional Comment:     _______________________________________________ Caroline Sauger, LCSW 06/07/2015, 1:32 PM

## 2015-06-07 NOTE — Progress Notes (Signed)
Subjective: Doing well.  No more vomiting after medication change. Awaiting transfer to snf.    Objective: Vital signs in last 24 hours: Temp:  [98.3 F (36.8 C)-99.2 F (37.3 C)] 99.2 F (37.3 C) (08/26 0635) Pulse Rate:  [68-69] 69 (08/26 0635) Resp:  [18-20] 20 (08/26 0635) BP: (122-134)/(62-68) 134/68 mmHg (08/26 0635) SpO2:  [93 %-95 %] 95 % (08/26 0635)  Intake/Output from previous day: 08/25 0701 - 08/26 0700 In: 680 [P.O.:680] Out: -  Intake/Output this shift:    No results for input(s): HGB in the last 72 hours. No results for input(s): WBC, RBC, HCT, PLT in the last 72 hours. No results for input(s): NA, K, CL, CO2, BUN, CREATININE, GLUCOSE, CALCIUM in the last 72 hours. No results for input(s): LABPT, INR in the last 72 hours.  Exam:  Alert and oriented.  Shoulder immobilizer on.  NVI.  Moves fingers well.  Right knee some swelling.  Joint line tender.  Calf nontender.    Assessment/Plan: Transfer to snf when bed available.  Social work consult has been ordered.  Patient prefers [facility]     OWENS,JAMES M 06/07/2015, 9:28 AM

## 2015-06-08 ENCOUNTER — Emergency Department (HOSPITAL_COMMUNITY)
Admission: EM | Admit: 2015-06-08 | Discharge: 2015-06-08 | Disposition: A | Discharge status: Home / Self Care | Payer: Medicare Other | Attending: Emergency Medicine | Admitting: Emergency Medicine

## 2015-06-08 ENCOUNTER — Encounter (HOSPITAL_COMMUNITY): Payer: Self-pay | Admitting: *Deleted

## 2015-06-08 DIAGNOSIS — Z7952 Long term (current) use of systemic steroids: Secondary | ICD-10-CM | POA: Insufficient documentation

## 2015-06-08 DIAGNOSIS — M1990 Unspecified osteoarthritis, unspecified site: Secondary | ICD-10-CM | POA: Insufficient documentation

## 2015-06-08 DIAGNOSIS — K219 Gastro-esophageal reflux disease without esophagitis: Secondary | ICD-10-CM | POA: Diagnosis not present

## 2015-06-08 DIAGNOSIS — Z862 Personal history of diseases of the blood and blood-forming organs and certain disorders involving the immune mechanism: Secondary | ICD-10-CM | POA: Diagnosis not present

## 2015-06-08 DIAGNOSIS — Y9389 Activity, other specified: Secondary | ICD-10-CM | POA: Insufficient documentation

## 2015-06-08 DIAGNOSIS — Y998 Other external cause status: Secondary | ICD-10-CM | POA: Insufficient documentation

## 2015-06-08 DIAGNOSIS — G8929 Other chronic pain: Secondary | ICD-10-CM | POA: Insufficient documentation

## 2015-06-08 DIAGNOSIS — T7840XA Allergy, unspecified, initial encounter: Secondary | ICD-10-CM

## 2015-06-08 DIAGNOSIS — Y9289 Other specified places as the place of occurrence of the external cause: Secondary | ICD-10-CM | POA: Insufficient documentation

## 2015-06-08 DIAGNOSIS — Z8639 Personal history of other endocrine, nutritional and metabolic disease: Secondary | ICD-10-CM | POA: Diagnosis not present

## 2015-06-08 DIAGNOSIS — Z79899 Other long term (current) drug therapy: Secondary | ICD-10-CM | POA: Diagnosis not present

## 2015-06-08 DIAGNOSIS — Z7951 Long term (current) use of inhaled steroids: Secondary | ICD-10-CM | POA: Insufficient documentation

## 2015-06-08 DIAGNOSIS — R110 Nausea: Secondary | ICD-10-CM | POA: Diagnosis not present

## 2015-06-08 NOTE — ED Notes (Signed)
Pt is here from [facility].  She is there for rehab due to a fall earlier this week (fell from her deck) Today she believes she had an allergic reaction to an unknown source.  Per EMS there was no anaphylaxis, no hives Or SOB.  Pt stated to them that she felt like her throat was closing and she was swelling all over from the inside Out.  She received an epi-pen jr injection around 10ish

## 2015-06-08 NOTE — Discharge Instructions (Signed)
Please follow-up with your doctor for reevaluation and further management of your allergist. Return to ED for new or worsening symptoms.  Allergies Allergies may happen from anything your body is sensitive to. This may be food, medicines, pollens, chemicals, and nearly anything around you in everyday life that produces allergens. An allergen is anything that causes an allergy producing substance. Heredity is often a factor in causing these problems. This means you may have some of the same allergies as your parents. Food allergies happen in all age groups. Food allergies are some of the most severe and life threatening. Some common food allergies are cow's milk, seafood, eggs, nuts, wheat, and soybeans. SYMPTOMS   Swelling around the mouth.  An itchy red rash or hives.  Vomiting or diarrhea.  Difficulty breathing. SEVERE ALLERGIC REACTIONS ARE LIFE-THREATENING. This reaction is called anaphylaxis. It can cause the mouth and throat to swell and cause difficulty with breathing and swallowing. In severe reactions only a trace amount of food (for example, peanut oil in a salad) may cause death within seconds. Seasonal allergies occur in all age groups. These are seasonal because they usually occur during the same season every year. They may be a reaction to molds, grass pollens, or tree pollens. Other causes of problems are house dust mite allergens, pet dander, and mold spores. The symptoms often consist of nasal congestion, a runny itchy nose associated with sneezing, and tearing itchy eyes. There is often an associated itching of the mouth and ears. The problems happen when you come in contact with pollens and other allergens. Allergens are the particles in the air that the body reacts to with an allergic reaction. This causes you to release allergic antibodies. Through a chain of events, these eventually cause you to release histamine into the blood stream. Although it is meant to be protective to  the body, it is this release that causes your discomfort. This is why you were given anti-histamines to feel better. If you are unable to pinpoint the offending allergen, it may be determined by skin or blood testing. Allergies cannot be cured but can be controlled with medicine. Hay fever is a collection of all or some of the seasonal allergy problems. It may often be treated with simple over-the-counter medicine such as diphenhydramine. Take medicine as directed. Do not drink alcohol or drive while taking this medicine. Check with your caregiver or package insert for child dosages. If these medicines are not effective, there are many new medicines your caregiver can prescribe. Stronger medicine such as nasal spray, eye drops, and corticosteroids may be used if the first things you try do not work well. Other treatments such as immunotherapy or desensitizing injections can be used if all else fails. Follow up with your caregiver if problems continue. These seasonal allergies are usually not life threatening. They are generally more of a nuisance that can often be handled using medicine. HOME CARE INSTRUCTIONS   If unsure what causes a reaction, keep a diary of foods eaten and symptoms that follow. Avoid foods that cause reactions.  If hives or rash are present:  Take medicine as directed.  You may use an over-the-counter antihistamine (diphenhydramine) for hives and itching as needed.  Apply cold compresses (cloths) to the skin or take baths in cool water. Avoid hot baths or showers. Heat will make a rash and itching worse.  If you are severely allergic:  Following a treatment for a severe reaction, hospitalization is often required for closer follow-up.  Wear a medic-alert bracelet or necklace stating the allergy.  You and your family must learn how to give adrenaline or use an anaphylaxis kit.  If you have had a severe reaction, always carry your anaphylaxis kit or EpiPen with you. Use  this medicine as directed by your caregiver if a severe reaction is occurring. Failure to do so could have a fatal outcome. SEEK MEDICAL CARE IF:  You suspect a food allergy. Symptoms generally happen within 30 minutes of eating a food.  Your symptoms have not gone away within 2 days or are getting worse.  You develop new symptoms.  You want to retest yourself or your child with a food or drink you think causes an allergic reaction. Never do this if an anaphylactic reaction to that food or drink has happened before. Only do this under the care of a caregiver. SEEK IMMEDIATE MEDICAL CARE IF:   You have difficulty breathing, are wheezing, or have a tight feeling in your chest or throat.  You have a swollen mouth, or you have hives, swelling, or itching all over your body.  You have had a severe reaction that has responded to your anaphylaxis kit or an EpiPen. These reactions may return when the medicine has worn off. These reactions should be considered life threatening. MAKE SURE YOU:   Understand these instructions.  Will watch your condition.  Will get help right away if you are not doing well or get worse. Document Released: 12/22/2002 Document Revised: 01/23/2013 Document Reviewed: 05/28/2008 [facility] Patient Information 2015 [place], [place]. This information is not intended to replace advice given to you by your health care provider. Make sure you discuss any questions you have with your health care provider.

## 2015-06-08 NOTE — ED Notes (Signed)
[place] and spoke with Crystal.  Report given.  Advised that patient will be returning to their facility via [place].

## 2015-06-08 NOTE — ED Notes (Signed)
Pt was given ginger ale to drink, tolerating it well and no difficulty swallowing.
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
## 2015-06-10 NOTE — Progress Notes (Signed)
Addendem to PT evaluation - added G-codes    July 02, 2015 1611  PT G-Codes **NOT FOR INPATIENT CLASS**  Functional Assessment Tool Used clinical judgment  Functional Limitation Mobility: Walking and moving around  Mobility: Walking and Moving Around Current Status [phone]) CK  Mobility: Walking and Moving Around Goal Status [phone]) [place], PT, DPT [phone]

## 2015-06-11 ENCOUNTER — Non-Acute Institutional Stay (SKILLED_NURSING_FACILITY): Payer: Medicare Other | Admitting: Internal Medicine

## 2015-06-11 DIAGNOSIS — M1711 Unilateral primary osteoarthritis, right knee: Secondary | ICD-10-CM | POA: Diagnosis not present

## 2015-06-11 DIAGNOSIS — S42291S Other displaced fracture of upper end of right humerus, sequela: Secondary | ICD-10-CM | POA: Diagnosis not present

## 2015-06-11 DIAGNOSIS — R110 Nausea: Secondary | ICD-10-CM | POA: Diagnosis not present

## 2015-06-11 DIAGNOSIS — T7840XA Allergy, unspecified, initial encounter: Secondary | ICD-10-CM | POA: Diagnosis not present

## 2015-06-11 DIAGNOSIS — R21 Rash and other nonspecific skin eruption: Secondary | ICD-10-CM | POA: Diagnosis not present

## 2015-06-11 DIAGNOSIS — D72829 Elevated white blood cell count, unspecified: Secondary | ICD-10-CM

## 2015-06-11 DIAGNOSIS — R2681 Unsteadiness on feet: Secondary | ICD-10-CM

## 2015-06-11 DIAGNOSIS — I10 Essential (primary) hypertension: Secondary | ICD-10-CM

## 2015-06-11 DIAGNOSIS — K219 Gastro-esophageal reflux disease without esophagitis: Secondary | ICD-10-CM | POA: Diagnosis not present
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
## 2015-06-18 DIAGNOSIS — S42291D Other displaced fracture of upper end of right humerus, subsequent encounter for fracture with routine healing: Secondary | ICD-10-CM | POA: Diagnosis not present

## 2015-06-18 DIAGNOSIS — T7840XA Allergy, unspecified, initial encounter: Secondary | ICD-10-CM | POA: Diagnosis not present

## 2015-06-18 DIAGNOSIS — M25569 Pain in unspecified knee: Secondary | ICD-10-CM | POA: Diagnosis not present

## 2015-06-19 ENCOUNTER — Other Ambulatory Visit: Payer: Self-pay

## 2015-06-19 MED ORDER — HYDROCODONE-ACETAMINOPHEN 7.5-325 MG PO TABS: ORAL_TABLET | ORAL | Status: DC

## 2015-06-19 NOTE — Telephone Encounter (Signed)
Rx faxed to [facility] @ [phone], phone number [phone]  

## 2015-06-24 ENCOUNTER — Non-Acute Institutional Stay (SKILLED_NURSING_FACILITY): Payer: Medicare Other | Admitting: Nurse Practitioner

## 2015-06-24 DIAGNOSIS — S42291S Other displaced fracture of upper end of right humerus, sequela: Secondary | ICD-10-CM | POA: Diagnosis not present

## 2015-06-24 DIAGNOSIS — K219 Gastro-esophageal reflux disease without esophagitis: Secondary | ICD-10-CM | POA: Diagnosis not present

## 2015-06-24 DIAGNOSIS — R2681 Unsteadiness on feet: Secondary | ICD-10-CM

## 2015-06-24 DIAGNOSIS — R110 Nausea: Secondary | ICD-10-CM | POA: Diagnosis not present

## 2015-06-24 DIAGNOSIS — I10 Essential (primary) hypertension: Secondary | ICD-10-CM

## 2015-06-24 DIAGNOSIS — M1711 Unilateral primary osteoarthritis, right knee: Secondary | ICD-10-CM
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
## 2015-06-27 DIAGNOSIS — Z9181 History of falling: Secondary | ICD-10-CM | POA: Diagnosis not present

## 2015-06-27 DIAGNOSIS — S42291D Other displaced fracture of upper end of right humerus, subsequent encounter for fracture with routine healing: Secondary | ICD-10-CM | POA: Diagnosis not present

## 2015-06-27 DIAGNOSIS — I10 Essential (primary) hypertension: Secondary | ICD-10-CM | POA: Diagnosis not present

## 2015-06-27 DIAGNOSIS — M1711 Unilateral primary osteoarthritis, right knee: Secondary | ICD-10-CM | POA: Diagnosis not present

## 2015-06-27 DIAGNOSIS — Z79891 Long term (current) use of opiate analgesic: Secondary | ICD-10-CM | POA: Diagnosis not present

## 2015-07-01 ENCOUNTER — Telehealth: Payer: Self-pay | Admitting: *Deleted

## 2015-07-01 DIAGNOSIS — S42291D Other displaced fracture of upper end of right humerus, subsequent encounter for fracture with routine healing: Secondary | ICD-10-CM | POA: Diagnosis not present

## 2015-07-01 DIAGNOSIS — Z79891 Long term (current) use of opiate analgesic: Secondary | ICD-10-CM | POA: Diagnosis not present

## 2015-07-01 DIAGNOSIS — Z9181 History of falling: Secondary | ICD-10-CM | POA: Diagnosis not present

## 2015-07-01 DIAGNOSIS — M1711 Unilateral primary osteoarthritis, right knee: Secondary | ICD-10-CM | POA: Diagnosis not present

## 2015-07-01 DIAGNOSIS — I10 Essential (primary) hypertension: Secondary | ICD-10-CM | POA: Diagnosis not present

## 2015-07-01 NOTE — Telephone Encounter (Signed)
Cyril Mourning (nurse with [facility]), left voicemail at Triage requesting Nursing orders for med management, and OT orders for ADL assistance, they already have opened PT orders for pt

## 2015-07-02 NOTE — Telephone Encounter (Signed)
Verbal okay given to [place] at [place] for Nursing and OT.

## 2015-07-02 NOTE — Telephone Encounter (Signed)
I am not sure what is needed specifically.  Do they have a form?

## 2015-07-02 NOTE — Telephone Encounter (Signed)
Sorry for the confusion they just need a verbal order for the Nursing and OT

## 2015-07-03 DIAGNOSIS — I10 Essential (primary) hypertension: Secondary | ICD-10-CM | POA: Diagnosis not present

## 2015-07-03 DIAGNOSIS — S42291D Other displaced fracture of upper end of right humerus, subsequent encounter for fracture with routine healing: Secondary | ICD-10-CM | POA: Diagnosis not present

## 2015-07-03 DIAGNOSIS — M1711 Unilateral primary osteoarthritis, right knee: Secondary | ICD-10-CM | POA: Diagnosis not present

## 2015-07-03 DIAGNOSIS — Z9181 History of falling: Secondary | ICD-10-CM | POA: Diagnosis not present

## 2015-07-03 DIAGNOSIS — Z79891 Long term (current) use of opiate analgesic: Secondary | ICD-10-CM | POA: Diagnosis not present
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
## 2015-07-04 ENCOUNTER — Ambulatory Visit: Payer: Medicare Other | Admitting: Family Medicine

## 2015-07-04 DIAGNOSIS — S42291D Other displaced fracture of upper end of right humerus, subsequent encounter for fracture with routine healing: Secondary | ICD-10-CM | POA: Diagnosis not present

## 2015-07-04 DIAGNOSIS — M1711 Unilateral primary osteoarthritis, right knee: Secondary | ICD-10-CM | POA: Diagnosis not present

## 2015-07-04 DIAGNOSIS — Z9181 History of falling: Secondary | ICD-10-CM | POA: Diagnosis not present

## 2015-07-04 DIAGNOSIS — Z79891 Long term (current) use of opiate analgesic: Secondary | ICD-10-CM | POA: Diagnosis not present

## 2015-07-04 DIAGNOSIS — I10 Essential (primary) hypertension: Secondary | ICD-10-CM | POA: Diagnosis not present

## 2015-07-05 ENCOUNTER — Encounter: Payer: Self-pay | Admitting: Family Medicine

## 2015-07-05 ENCOUNTER — Ambulatory Visit (INDEPENDENT_AMBULATORY_CARE_PROVIDER_SITE_OTHER): Payer: Medicare Other | Admitting: Family Medicine

## 2015-07-05 VITALS — BP 139/77 | HR 64 | Temp 98.3°F | Ht 64.0 in | Wt 169.8 lb

## 2015-07-05 DIAGNOSIS — S42291D Other displaced fracture of upper end of right humerus, subsequent encounter for fracture with routine healing: Secondary | ICD-10-CM | POA: Diagnosis not present

## 2015-07-05 DIAGNOSIS — E7800 Pure hypercholesterolemia, unspecified: Secondary | ICD-10-CM

## 2015-07-05 DIAGNOSIS — H918X1 Other specified hearing loss, right ear: Secondary | ICD-10-CM

## 2015-07-05 DIAGNOSIS — Z23 Encounter for immunization: Secondary | ICD-10-CM

## 2015-07-05 DIAGNOSIS — R7309 Other abnormal glucose: Secondary | ICD-10-CM | POA: Diagnosis not present

## 2015-07-05 DIAGNOSIS — I10 Essential (primary) hypertension: Secondary | ICD-10-CM

## 2015-07-05 DIAGNOSIS — H6121 Impacted cerumen, right ear: Secondary | ICD-10-CM

## 2015-07-05 DIAGNOSIS — M1711 Unilateral primary osteoarthritis, right knee: Secondary | ICD-10-CM | POA: Diagnosis not present

## 2015-07-05 DIAGNOSIS — Z9181 History of falling: Secondary | ICD-10-CM | POA: Diagnosis not present

## 2015-07-05 DIAGNOSIS — R7303 Prediabetes: Secondary | ICD-10-CM

## 2015-07-05 DIAGNOSIS — S42291S Other displaced fracture of upper end of right humerus, sequela: Secondary | ICD-10-CM | POA: Diagnosis not present

## 2015-07-05 DIAGNOSIS — Z79891 Long term (current) use of opiate analgesic: Secondary | ICD-10-CM | POA: Diagnosis not present

## 2015-07-05 NOTE — Progress Notes (Addendum)
.  Subjective:    Patient ID: Sherri Murray, female    DOB: 12/15/1940, 74 y.o.   MRN: [id number]  HPI  74 year old female patient presents for follow up after recent discharge from [facility] for rehab.   ON 06/04/2015 she was admitted to [facility] for humeral head fracture, right knee DJD, rib fracture after accidental fall. Managed nonoperatively by Ortho, Dr. Rodell Perna.  Discharged on 8/26 to rehab.  She was seen briefly in ER while at [facility] on 8/30 for possible allergic reaction to unknown vs expired meds. She gave her self dose of her Epi pen.  In [place] [facility] few weeks ago she fell again after slipping on rub. She was discharged 1 week ago from [facility].   Today she reports she is doing well. PT and OT coming out to her house. Using wheelchair now. Trying to increase mobility.   She has follow up with Ortho coming up.  Using oxycodone every three hour for pain.  BP well controlled on losartan HCTZ. BP Readings from Last 3 Encounters:  07/05/15 139/77  06/24/15 130/64  06/11/15 143/83     Wt Readings from Last 3 Encounters:  07/05/15 169 lb 12 oz (76.998 kg)  06/04/15 179 lb (81.194 kg)  12/20/14 167 lb 12 oz (76.091 kg)     Review of Systems  Constitutional: Negative for fever and fatigue.  HENT: Negative for ear pain.   Eyes: Negative for pain.  Respiratory: Negative for chest tightness and shortness of breath.   Cardiovascular: Negative for chest pain, palpitations and leg swelling.  Gastrointestinal: Negative for abdominal pain.  Genitourinary: Negative for dysuria.       Objective:   Physical Exam  Constitutional: Vital signs are normal. She appears well-developed and well-nourished. She is cooperative.  Non-toxic appearance. She does not appear ill. No distress.  Elderly female in wheelchair, right knee in immobilizer and right shoulder in sling    HENT:  Head: Normocephalic.  Right Ear: Hearing, tympanic membrane, external ear and ear  canal normal. Tympanic membrane is not erythematous, not retracted and not bulging. No middle ear effusion.  Left Ear: Hearing, tympanic membrane, external ear and ear canal normal. Tympanic membrane is not erythematous, not retracted and not bulging.  Nose: No mucosal edema or rhinorrhea. Right sinus exhibits no maxillary sinus tenderness and no frontal sinus tenderness. Left sinus exhibits no maxillary sinus tenderness and no frontal sinus tenderness.  Mouth/Throat: Uvula is midline, oropharynx is clear and moist and mucous membranes are normal.  Right cerumen impaction.Margy Clarks, see procedure note.  Eyes: Conjunctivae, EOM and lids are normal. Pupils are equal, round, and reactive to light. Lids are everted and swept, no foreign bodies found.  Neck: Trachea normal and normal range of motion. Neck supple. Carotid bruit is not present. No thyroid mass and no thyromegaly present.  Cardiovascular: Normal rate, regular rhythm, S1 normal, S2 normal, normal heart sounds, intact distal pulses and normal pulses.  Exam reveals no gallop and no friction rub.   No murmur heard. Pulmonary/Chest: Effort normal and breath sounds normal. No tachypnea. No respiratory distress. She has no decreased breath sounds. She has no wheezes. She has no rhonchi. She has no rales.  Abdominal: Soft. Normal appearance and bowel sounds are normal. There is no tenderness.  Neurological: She is alert.  Skin: Skin is warm, dry and intact. No rash noted.  Bruising right anterior upper arm  Psychiatric: Her speech is normal and behavior is  normal. Judgment and thought content normal. Her mood appears not anxious. Cognition and memory are normal. She does not exhibit a depressed mood.          Assessment & Plan:  Procedure note: Unable to remove all ear wax in right ear with simple irrigation suction.  Use alligator forceps to remove remaining wax without complications.

## 2015-07-05 NOTE — Assessment & Plan Note (Signed)
HAs lost 10 lbs in [facility], but very inactive. Re-eval at [facility].

## 2015-07-05 NOTE — Assessment & Plan Note (Signed)
Followed by Dr. Lorin Mercy

## 2015-07-05 NOTE — Addendum Note (Signed)
Addended by: Carter Kitten on: 07/05/2015 10:03 AM   Modules accepted: Orders

## 2015-07-05 NOTE — Assessment & Plan Note (Signed)
Due for re-eval.. schedudle medicare wellness

## 2015-07-05 NOTE — Assessment & Plan Note (Signed)
Well controlled. Continue current medication.  

## 2015-07-05 NOTE — Progress Notes (Signed)
Pre visit review using our clinic review tool, if applicable. No additional management support is needed unless otherwise documented below in the visit note. 

## 2015-07-08 DIAGNOSIS — Z79891 Long term (current) use of opiate analgesic: Secondary | ICD-10-CM | POA: Diagnosis not present

## 2015-07-08 DIAGNOSIS — Z9181 History of falling: Secondary | ICD-10-CM | POA: Diagnosis not present

## 2015-07-08 DIAGNOSIS — S42291D Other displaced fracture of upper end of right humerus, subsequent encounter for fracture with routine healing: Secondary | ICD-10-CM | POA: Diagnosis not present

## 2015-07-08 DIAGNOSIS — M1711 Unilateral primary osteoarthritis, right knee: Secondary | ICD-10-CM | POA: Diagnosis not present

## 2015-07-08 DIAGNOSIS — I10 Essential (primary) hypertension: Secondary | ICD-10-CM | POA: Diagnosis not present

## 2015-07-10 DIAGNOSIS — Z9181 History of falling: Secondary | ICD-10-CM | POA: Diagnosis not present

## 2015-07-10 DIAGNOSIS — M1711 Unilateral primary osteoarthritis, right knee: Secondary | ICD-10-CM | POA: Diagnosis not present

## 2015-07-10 DIAGNOSIS — I10 Essential (primary) hypertension: Secondary | ICD-10-CM | POA: Diagnosis not present

## 2015-07-10 DIAGNOSIS — S42291D Other displaced fracture of upper end of right humerus, subsequent encounter for fracture with routine healing: Secondary | ICD-10-CM | POA: Diagnosis not present

## 2015-07-10 DIAGNOSIS — Z79891 Long term (current) use of opiate analgesic: Secondary | ICD-10-CM | POA: Diagnosis not present

## 2015-07-11 ENCOUNTER — Telehealth: Payer: Self-pay

## 2015-07-11 DIAGNOSIS — I10 Essential (primary) hypertension: Secondary | ICD-10-CM | POA: Diagnosis not present

## 2015-07-11 DIAGNOSIS — Z9181 History of falling: Secondary | ICD-10-CM | POA: Diagnosis not present

## 2015-07-11 DIAGNOSIS — S42291D Other displaced fracture of upper end of right humerus, subsequent encounter for fracture with routine healing: Secondary | ICD-10-CM | POA: Diagnosis not present

## 2015-07-11 DIAGNOSIS — M1711 Unilateral primary osteoarthritis, right knee: Secondary | ICD-10-CM | POA: Diagnosis not present

## 2015-07-11 DIAGNOSIS — Z79891 Long term (current) use of opiate analgesic: Secondary | ICD-10-CM | POA: Diagnosis not present

## 2015-07-11 NOTE — Telephone Encounter (Signed)
Left message for Sherri Murray to return my call.

## 2015-07-11 NOTE — Telephone Encounter (Signed)
[name] OT with [facility] left v/m requesting verbal order for home health incontinence teaching for pt and request order for incontinent supplies. Please advise.

## 2015-07-12 DIAGNOSIS — Z9181 History of falling: Secondary | ICD-10-CM | POA: Diagnosis not present

## 2015-07-12 DIAGNOSIS — I10 Essential (primary) hypertension: Secondary | ICD-10-CM | POA: Diagnosis not present

## 2015-07-12 DIAGNOSIS — Z79891 Long term (current) use of opiate analgesic: Secondary | ICD-10-CM | POA: Diagnosis not present

## 2015-07-12 DIAGNOSIS — M1711 Unilateral primary osteoarthritis, right knee: Secondary | ICD-10-CM | POA: Diagnosis not present

## 2015-07-12 DIAGNOSIS — S42291D Other displaced fracture of upper end of right humerus, subsequent encounter for fracture with routine healing: Secondary | ICD-10-CM | POA: Diagnosis not present

## 2015-07-12 NOTE — Telephone Encounter (Signed)
Verbal order given to [place] with [facility] to do incontinence teaching and order incontinent supplies.

## 2015-07-12 NOTE — Telephone Encounter (Signed)
Ok to give verbal orders?

## 2015-07-15 DIAGNOSIS — Z79891 Long term (current) use of opiate analgesic: Secondary | ICD-10-CM | POA: Diagnosis not present

## 2015-07-15 DIAGNOSIS — Z9181 History of falling: Secondary | ICD-10-CM | POA: Diagnosis not present

## 2015-07-15 DIAGNOSIS — I10 Essential (primary) hypertension: Secondary | ICD-10-CM | POA: Diagnosis not present

## 2015-07-15 DIAGNOSIS — M1711 Unilateral primary osteoarthritis, right knee: Secondary | ICD-10-CM | POA: Diagnosis not present

## 2015-07-15 DIAGNOSIS — S42291D Other displaced fracture of upper end of right humerus, subsequent encounter for fracture with routine healing: Secondary | ICD-10-CM | POA: Diagnosis not present

## 2015-07-16 DIAGNOSIS — S42291D Other displaced fracture of upper end of right humerus, subsequent encounter for fracture with routine healing: Secondary | ICD-10-CM | POA: Diagnosis not present

## 2015-07-17 DIAGNOSIS — Z79891 Long term (current) use of opiate analgesic: Secondary | ICD-10-CM | POA: Diagnosis not present

## 2015-07-17 DIAGNOSIS — I10 Essential (primary) hypertension: Secondary | ICD-10-CM | POA: Diagnosis not present

## 2015-07-17 DIAGNOSIS — Z9181 History of falling: Secondary | ICD-10-CM | POA: Diagnosis not present

## 2015-07-17 DIAGNOSIS — S42291D Other displaced fracture of upper end of right humerus, subsequent encounter for fracture with routine healing: Secondary | ICD-10-CM | POA: Diagnosis not present

## 2015-07-17 DIAGNOSIS — M1711 Unilateral primary osteoarthritis, right knee: Secondary | ICD-10-CM | POA: Diagnosis not present

## 2015-07-18 DIAGNOSIS — S42291D Other displaced fracture of upper end of right humerus, subsequent encounter for fracture with routine healing: Secondary | ICD-10-CM | POA: Diagnosis not present

## 2015-07-18 DIAGNOSIS — Z9181 History of falling: Secondary | ICD-10-CM | POA: Diagnosis not present

## 2015-07-18 DIAGNOSIS — M1711 Unilateral primary osteoarthritis, right knee: Secondary | ICD-10-CM | POA: Diagnosis not present

## 2015-07-18 DIAGNOSIS — I10 Essential (primary) hypertension: Secondary | ICD-10-CM | POA: Diagnosis not present

## 2015-07-18 DIAGNOSIS — Z79891 Long term (current) use of opiate analgesic: Secondary | ICD-10-CM | POA: Diagnosis not present

## 2015-07-19 DIAGNOSIS — M1711 Unilateral primary osteoarthritis, right knee: Secondary | ICD-10-CM | POA: Diagnosis not present

## 2015-07-19 DIAGNOSIS — S42291D Other displaced fracture of upper end of right humerus, subsequent encounter for fracture with routine healing: Secondary | ICD-10-CM | POA: Diagnosis not present

## 2015-07-19 DIAGNOSIS — Z9181 History of falling: Secondary | ICD-10-CM | POA: Diagnosis not present

## 2015-07-19 DIAGNOSIS — Z79891 Long term (current) use of opiate analgesic: Secondary | ICD-10-CM | POA: Diagnosis not present

## 2015-07-19 DIAGNOSIS — I10 Essential (primary) hypertension: Secondary | ICD-10-CM | POA: Diagnosis not present

## 2015-07-22 ENCOUNTER — Telehealth: Payer: Self-pay | Admitting: Family Medicine

## 2015-07-22 DIAGNOSIS — S42291D Other displaced fracture of upper end of right humerus, subsequent encounter for fracture with routine healing: Secondary | ICD-10-CM | POA: Diagnosis not present

## 2015-07-22 DIAGNOSIS — Z9181 History of falling: Secondary | ICD-10-CM | POA: Diagnosis not present

## 2015-07-22 DIAGNOSIS — I10 Essential (primary) hypertension: Secondary | ICD-10-CM | POA: Diagnosis not present

## 2015-07-22 DIAGNOSIS — M1711 Unilateral primary osteoarthritis, right knee: Secondary | ICD-10-CM | POA: Diagnosis not present

## 2015-07-22 DIAGNOSIS — Z79891 Long term (current) use of opiate analgesic: Secondary | ICD-10-CM | POA: Diagnosis not present

## 2015-07-22 DIAGNOSIS — M2550 Pain in unspecified joint: Secondary | ICD-10-CM

## 2015-07-22 NOTE — Telephone Encounter (Signed)
Sherri Murray at [facility] called.  Patient is having body pain from Arthritis all over her body.  Sherri Murray wants to know if patient can be referred to a Rheumatologist.

## 2015-07-23 ENCOUNTER — Other Ambulatory Visit: Payer: Self-pay | Admitting: Allergy and Immunology

## 2015-07-23 ENCOUNTER — Other Ambulatory Visit: Payer: Self-pay | Admitting: Family Medicine

## 2015-07-23 MED ORDER — RANITIDINE HCL 150 MG PO TABS: 150.0000 mg | ORAL_TABLET | Freq: Two times a day (BID) | ORAL | Status: DC

## 2015-07-23 MED ORDER — LORATADINE 10 MG PO TABS: 10.0000 mg | ORAL_TABLET | Freq: Two times a day (BID) | ORAL | Status: DC

## 2015-07-23 NOTE — Telephone Encounter (Signed)
Left message for Andee Poles with [facility] that Dr. Diona Browner has put in referral for Rheumatologist.  Advised Rosaria Ferries or Ebony Hail will call Ms. Krist with that appointment once it has been scheduled.

## 2015-07-23 NOTE — Telephone Encounter (Signed)
Referral sent 

## 2015-07-23 NOTE — Addendum Note (Signed)
Addended by: Eliezer Lofts E on: 07/23/2015 08:16 AM   Modules accepted: Orders

## 2015-07-24 ENCOUNTER — Telehealth: Payer: Self-pay | Admitting: Family Medicine

## 2015-07-24 DIAGNOSIS — I10 Essential (primary) hypertension: Secondary | ICD-10-CM | POA: Diagnosis not present

## 2015-07-24 DIAGNOSIS — Z9181 History of falling: Secondary | ICD-10-CM | POA: Diagnosis not present

## 2015-07-24 DIAGNOSIS — S42291D Other displaced fracture of upper end of right humerus, subsequent encounter for fracture with routine healing: Secondary | ICD-10-CM | POA: Diagnosis not present

## 2015-07-24 DIAGNOSIS — M1711 Unilateral primary osteoarthritis, right knee: Secondary | ICD-10-CM | POA: Diagnosis not present

## 2015-07-24 DIAGNOSIS — Z79891 Long term (current) use of opiate analgesic: Secondary | ICD-10-CM | POA: Diagnosis not present

## 2015-07-24 NOTE — Telephone Encounter (Signed)
Cindy @ [facility] called ms Serio is not feeling well pain all over low grade fever. And diarrhea.  Do you want to do a UA? Pt didn't want to come in for office visit

## 2015-07-24 NOTE — Telephone Encounter (Signed)
If no response from Dr. B by AM, please have them obtain a urine for a UA and Urine Culture.

## 2015-07-25 DIAGNOSIS — M1711 Unilateral primary osteoarthritis, right knee: Secondary | ICD-10-CM | POA: Diagnosis not present

## 2015-07-25 DIAGNOSIS — S72051A Unspecified fracture of head of right femur, initial encounter for closed fracture: Secondary | ICD-10-CM | POA: Diagnosis not present

## 2015-07-25 DIAGNOSIS — Z9181 History of falling: Secondary | ICD-10-CM | POA: Diagnosis not present

## 2015-07-25 DIAGNOSIS — S42291D Other displaced fracture of upper end of right humerus, subsequent encounter for fracture with routine healing: Secondary | ICD-10-CM | POA: Diagnosis not present

## 2015-07-25 DIAGNOSIS — I10 Essential (primary) hypertension: Secondary | ICD-10-CM | POA: Diagnosis not present

## 2015-07-25 DIAGNOSIS — Z79891 Long term (current) use of opiate analgesic: Secondary | ICD-10-CM | POA: Diagnosis not present

## 2015-07-25 NOTE — Telephone Encounter (Signed)
Spoke with Kistan at [facility] and gave verbal order for UA and Urine Culture per Dr. Lorelei Pont.

## 2015-07-26 ENCOUNTER — Encounter: Payer: Self-pay | Admitting: Family Medicine

## 2015-07-26 ENCOUNTER — Ambulatory Visit (INDEPENDENT_AMBULATORY_CARE_PROVIDER_SITE_OTHER): Payer: Medicare Other | Admitting: Family Medicine

## 2015-07-26 VITALS — BP 130/75 | HR 86 | Temp 97.9°F | Ht 64.0 in

## 2015-07-26 DIAGNOSIS — R52 Pain, unspecified: Secondary | ICD-10-CM | POA: Diagnosis not present

## 2015-07-26 DIAGNOSIS — R197 Diarrhea, unspecified: Secondary | ICD-10-CM | POA: Diagnosis not present

## 2015-07-26 DIAGNOSIS — S42291S Other displaced fracture of upper end of right humerus, sequela: Secondary | ICD-10-CM | POA: Diagnosis not present

## 2015-07-26 MED ORDER — TRAMADOL HCL 50 MG PO TABS: 50.0000 mg | ORAL_TABLET | Freq: Three times a day (TID) | ORAL | Status: DC | PRN

## 2015-07-26 NOTE — Progress Notes (Signed)
   Subjective:    Patient ID: Sherri Murray, female    DOB: 26-Jul-1941, 74 y.o.   MRN: [id number]  HPI  74 year old female with history of IBS, ischemic colitis, HTN recently  living in [place] ( since humeral fracture) presents with new onset  Diarrhea x 4 days, 4-5 BMs a day, loose.  Normal BM today. UA obtained and pending per [place].  No fever, no abdominal pain, no dysuria.    She also needs refill of pain med for pain in humerus and in knee injury. She has been having body pain all over for several years. Worse in last year. Severe episode of body pain last weekend. PAin in joints and muscles. No swelling and redness. 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  Sister with fibromyalgia.   She had hydrocodone 7.5 /325 every three hours for pain. She has not had in last week.   Review of Systems  Constitutional: Negative for fever and fatigue.  HENT: Negative for ear pain.   Eyes: Negative for pain.  Respiratory: Negative for chest tightness and shortness of breath.   Cardiovascular: Negative for chest pain, palpitations and leg swelling.  Gastrointestinal: Positive for diarrhea. Negative for abdominal pain.  Genitourinary: Negative for dysuria.       Objective:   Physical Exam  Constitutional: Vital signs are normal. She appears well-developed and well-nourished. She is cooperative.  Non-toxic appearance. She does not appear ill. No distress.  Elderly female in wheelchair, right knee in immobilizer and right shoulder in sling    HENT:  Head: Normocephalic.  Right Ear: Hearing, tympanic membrane, external ear and ear canal normal. Tympanic membrane is not erythematous, not retracted and not bulging. No middle ear effusion.  Left Ear: Hearing, tympanic membrane, external ear and ear canal normal. Tympanic membrane is not erythematous, not retracted and not bulging.  Nose: No mucosal edema or rhinorrhea. Right sinus exhibits no maxillary sinus tenderness and no frontal sinus  tenderness. Left sinus exhibits no maxillary sinus tenderness and no frontal sinus tenderness.  Mouth/Throat: Uvula is midline, oropharynx is clear and moist and mucous membranes are normal.  Eyes: Conjunctivae, EOM and lids are normal. Pupils are equal, round, and reactive to light. Lids are everted and swept, no foreign bodies found.  Neck: Trachea normal and normal range of motion. Neck supple. Carotid bruit is not present. No thyroid mass and no thyromegaly present.  Cardiovascular: Normal rate, regular rhythm, S1 normal, S2 normal, normal heart sounds, intact distal pulses and normal pulses.  Exam reveals no gallop and no friction rub.   No murmur heard. Pulmonary/Chest: Effort normal and breath sounds normal. No tachypnea. No respiratory distress. She has no decreased breath sounds. She has no wheezes. She has no rhonchi. She has no rales.  Abdominal: Soft. Normal appearance and bowel sounds are normal. There is no hepatosplenomegaly. There is no tenderness. There is no rigidity, no guarding and no CVA tenderness.  Musculoskeletal:  Diffuse tender points, unable to do full trigger point eval.  Neurological: She is alert.  Skin: Skin is warm, dry and intact. No rash noted.  Bruising right anterior upper arm  Psychiatric: Her speech is normal and behavior is normal. Judgment and thought content normal. Her mood appears not anxious. Cognition and memory are normal. She does not exhibit a depressed mood.          Assessment & Plan:

## 2015-07-26 NOTE — Patient Instructions (Addendum)
Push fluids, rest, bland diet, but return to normal food as soon as able.  If diarrhea returns.. Call for Cdifficile test of stool.  Wait for rheum testing oncce shoulder and legs improved... In next 3 months.  Can use tramadol for pain as needed.

## 2015-07-26 NOTE — Progress Notes (Signed)
Pre visit review using our clinic review tool, if applicable. No additional management support is needed unless otherwise documented below in the visit note. 

## 2015-07-29 DIAGNOSIS — Z9181 History of falling: Secondary | ICD-10-CM | POA: Diagnosis not present

## 2015-07-29 DIAGNOSIS — Z79891 Long term (current) use of opiate analgesic: Secondary | ICD-10-CM | POA: Diagnosis not present

## 2015-07-29 DIAGNOSIS — I10 Essential (primary) hypertension: Secondary | ICD-10-CM | POA: Diagnosis not present

## 2015-07-29 DIAGNOSIS — S42291D Other displaced fracture of upper end of right humerus, subsequent encounter for fracture with routine healing: Secondary | ICD-10-CM | POA: Diagnosis not present

## 2015-07-29 DIAGNOSIS — M1711 Unilateral primary osteoarthritis, right knee: Secondary | ICD-10-CM | POA: Diagnosis not present

## 2015-07-31 DIAGNOSIS — Z79891 Long term (current) use of opiate analgesic: Secondary | ICD-10-CM | POA: Diagnosis not present

## 2015-07-31 DIAGNOSIS — S42291D Other displaced fracture of upper end of right humerus, subsequent encounter for fracture with routine healing: Secondary | ICD-10-CM | POA: Diagnosis not present

## 2015-07-31 DIAGNOSIS — I10 Essential (primary) hypertension: Secondary | ICD-10-CM | POA: Diagnosis not present

## 2015-07-31 DIAGNOSIS — M1711 Unilateral primary osteoarthritis, right knee: Secondary | ICD-10-CM | POA: Diagnosis not present

## 2015-07-31 DIAGNOSIS — Z9181 History of falling: Secondary | ICD-10-CM | POA: Diagnosis not present

## 2015-08-01 DIAGNOSIS — Z9181 History of falling: Secondary | ICD-10-CM | POA: Diagnosis not present

## 2015-08-01 DIAGNOSIS — M1711 Unilateral primary osteoarthritis, right knee: Secondary | ICD-10-CM | POA: Diagnosis not present

## 2015-08-01 DIAGNOSIS — I10 Essential (primary) hypertension: Secondary | ICD-10-CM | POA: Diagnosis not present

## 2015-08-01 DIAGNOSIS — S42291D Other displaced fracture of upper end of right humerus, subsequent encounter for fracture with routine healing: Secondary | ICD-10-CM | POA: Diagnosis not present

## 2015-08-01 DIAGNOSIS — Z79891 Long term (current) use of opiate analgesic: Secondary | ICD-10-CM | POA: Diagnosis not present

## 2015-08-05 DIAGNOSIS — Z79891 Long term (current) use of opiate analgesic: Secondary | ICD-10-CM | POA: Diagnosis not present

## 2015-08-05 DIAGNOSIS — M1711 Unilateral primary osteoarthritis, right knee: Secondary | ICD-10-CM | POA: Diagnosis not present

## 2015-08-05 DIAGNOSIS — I10 Essential (primary) hypertension: Secondary | ICD-10-CM | POA: Diagnosis not present

## 2015-08-05 DIAGNOSIS — S42291D Other displaced fracture of upper end of right humerus, subsequent encounter for fracture with routine healing: Secondary | ICD-10-CM | POA: Diagnosis not present

## 2015-08-05 DIAGNOSIS — Z9181 History of falling: Secondary | ICD-10-CM | POA: Diagnosis not present

## 2015-08-06 ENCOUNTER — Other Ambulatory Visit: Payer: Self-pay | Admitting: Nurse Practitioner

## 2015-08-07 DIAGNOSIS — M1711 Unilateral primary osteoarthritis, right knee: Secondary | ICD-10-CM | POA: Diagnosis not present

## 2015-08-07 DIAGNOSIS — S42291D Other displaced fracture of upper end of right humerus, subsequent encounter for fracture with routine healing: Secondary | ICD-10-CM | POA: Diagnosis not present

## 2015-08-07 DIAGNOSIS — I10 Essential (primary) hypertension: Secondary | ICD-10-CM | POA: Diagnosis not present

## 2015-08-07 DIAGNOSIS — Z9181 History of falling: Secondary | ICD-10-CM | POA: Diagnosis not present

## 2015-08-07 DIAGNOSIS — Z79891 Long term (current) use of opiate analgesic: Secondary | ICD-10-CM | POA: Diagnosis not present

## 2015-08-08 DIAGNOSIS — Z9181 History of falling: Secondary | ICD-10-CM | POA: Diagnosis not present

## 2015-08-08 DIAGNOSIS — S42291D Other displaced fracture of upper end of right humerus, subsequent encounter for fracture with routine healing: Secondary | ICD-10-CM | POA: Diagnosis not present

## 2015-08-08 DIAGNOSIS — I10 Essential (primary) hypertension: Secondary | ICD-10-CM | POA: Diagnosis not present

## 2015-08-08 DIAGNOSIS — Z79891 Long term (current) use of opiate analgesic: Secondary | ICD-10-CM | POA: Diagnosis not present

## 2015-08-08 DIAGNOSIS — M1711 Unilateral primary osteoarthritis, right knee: Secondary | ICD-10-CM | POA: Diagnosis not present

## 2015-08-09 DIAGNOSIS — M1711 Unilateral primary osteoarthritis, right knee: Secondary | ICD-10-CM | POA: Diagnosis not present

## 2015-08-09 DIAGNOSIS — Z79891 Long term (current) use of opiate analgesic: Secondary | ICD-10-CM | POA: Diagnosis not present

## 2015-08-09 DIAGNOSIS — I10 Essential (primary) hypertension: Secondary | ICD-10-CM | POA: Diagnosis not present

## 2015-08-09 DIAGNOSIS — S42291D Other displaced fracture of upper end of right humerus, subsequent encounter for fracture with routine healing: Secondary | ICD-10-CM | POA: Diagnosis not present

## 2015-08-09 DIAGNOSIS — Z9181 History of falling: Secondary | ICD-10-CM | POA: Diagnosis not present

## 2015-08-12 DIAGNOSIS — I10 Essential (primary) hypertension: Secondary | ICD-10-CM | POA: Diagnosis not present

## 2015-08-12 DIAGNOSIS — M1711 Unilateral primary osteoarthritis, right knee: Secondary | ICD-10-CM | POA: Diagnosis not present

## 2015-08-12 DIAGNOSIS — Z79891 Long term (current) use of opiate analgesic: Secondary | ICD-10-CM | POA: Diagnosis not present

## 2015-08-12 DIAGNOSIS — Z9181 History of falling: Secondary | ICD-10-CM | POA: Diagnosis not present

## 2015-08-12 DIAGNOSIS — S42291D Other displaced fracture of upper end of right humerus, subsequent encounter for fracture with routine healing: Secondary | ICD-10-CM | POA: Diagnosis not present

## 2015-08-13 DIAGNOSIS — R52 Pain, unspecified: Secondary | ICD-10-CM | POA: Insufficient documentation

## 2015-08-13 DIAGNOSIS — R197 Diarrhea, unspecified: Secondary | ICD-10-CM | POA: Insufficient documentation

## 2015-08-13 NOTE — Assessment & Plan Note (Signed)
Pt wishes to complete rheum eval. i recommended against this until she has healed from her injuries, given it may affect the lab eval.  No sign of redness, swelling in small joints such as MCP, wrist, ankle etc. Most likely fibromyalgia, but need to rule out rheum issues in future. Pt agreeable.

## 2015-08-13 NOTE — Assessment & Plan Note (Signed)
Likely viral. Improving now, push fluids, rest.  if returns consider cdiff eval given recent hospitalization and antibiotics.

## 2015-08-13 NOTE — Assessment & Plan Note (Signed)
Refilled pain medication at [facility] today.

## 2015-08-14 DIAGNOSIS — Z9181 History of falling: Secondary | ICD-10-CM | POA: Diagnosis not present

## 2015-08-14 DIAGNOSIS — I10 Essential (primary) hypertension: Secondary | ICD-10-CM | POA: Diagnosis not present

## 2015-08-14 DIAGNOSIS — S42291D Other displaced fracture of upper end of right humerus, subsequent encounter for fracture with routine healing: Secondary | ICD-10-CM | POA: Diagnosis not present

## 2015-08-14 DIAGNOSIS — Z79891 Long term (current) use of opiate analgesic: Secondary | ICD-10-CM | POA: Diagnosis not present

## 2015-08-14 DIAGNOSIS — M1711 Unilateral primary osteoarthritis, right knee: Secondary | ICD-10-CM | POA: Diagnosis not present

## 2015-08-15 DIAGNOSIS — Z79891 Long term (current) use of opiate analgesic: Secondary | ICD-10-CM | POA: Diagnosis not present

## 2015-08-15 DIAGNOSIS — S42291D Other displaced fracture of upper end of right humerus, subsequent encounter for fracture with routine healing: Secondary | ICD-10-CM | POA: Diagnosis not present

## 2015-08-15 DIAGNOSIS — I10 Essential (primary) hypertension: Secondary | ICD-10-CM | POA: Diagnosis not present

## 2015-08-15 DIAGNOSIS — M1711 Unilateral primary osteoarthritis, right knee: Secondary | ICD-10-CM | POA: Diagnosis not present

## 2015-08-15 DIAGNOSIS — Z9181 History of falling: Secondary | ICD-10-CM | POA: Diagnosis not present

## 2015-08-19 DIAGNOSIS — Z79891 Long term (current) use of opiate analgesic: Secondary | ICD-10-CM | POA: Diagnosis not present

## 2015-08-19 DIAGNOSIS — S42291D Other displaced fracture of upper end of right humerus, subsequent encounter for fracture with routine healing: Secondary | ICD-10-CM | POA: Diagnosis not present

## 2015-08-19 DIAGNOSIS — I10 Essential (primary) hypertension: Secondary | ICD-10-CM | POA: Diagnosis not present

## 2015-08-19 DIAGNOSIS — M1711 Unilateral primary osteoarthritis, right knee: Secondary | ICD-10-CM | POA: Diagnosis not present

## 2015-08-19 DIAGNOSIS — Z9181 History of falling: Secondary | ICD-10-CM | POA: Diagnosis not present

## 2015-08-21 DIAGNOSIS — S42291D Other displaced fracture of upper end of right humerus, subsequent encounter for fracture with routine healing: Secondary | ICD-10-CM | POA: Diagnosis not present

## 2015-08-21 DIAGNOSIS — S2231XD Fracture of one rib, right side, subsequent encounter for fracture with routine healing: Secondary | ICD-10-CM | POA: Diagnosis not present

## 2015-08-22 DIAGNOSIS — Z9181 History of falling: Secondary | ICD-10-CM | POA: Diagnosis not present

## 2015-08-22 DIAGNOSIS — S42291D Other displaced fracture of upper end of right humerus, subsequent encounter for fracture with routine healing: Secondary | ICD-10-CM | POA: Diagnosis not present

## 2015-08-22 DIAGNOSIS — M1711 Unilateral primary osteoarthritis, right knee: Secondary | ICD-10-CM | POA: Diagnosis not present

## 2015-08-22 DIAGNOSIS — I10 Essential (primary) hypertension: Secondary | ICD-10-CM | POA: Diagnosis not present

## 2015-08-22 DIAGNOSIS — Z79891 Long term (current) use of opiate analgesic: Secondary | ICD-10-CM | POA: Diagnosis not present

## 2015-08-25 DIAGNOSIS — S72051A Unspecified fracture of head of right femur, initial encounter for closed fracture: Secondary | ICD-10-CM | POA: Diagnosis not present

## 2015-08-26 ENCOUNTER — Telehealth: Payer: Self-pay | Admitting: *Deleted

## 2015-08-26 DIAGNOSIS — Z9181 History of falling: Secondary | ICD-10-CM | POA: Diagnosis not present

## 2015-08-26 DIAGNOSIS — R32 Unspecified urinary incontinence: Secondary | ICD-10-CM | POA: Diagnosis not present

## 2015-08-26 DIAGNOSIS — I10 Essential (primary) hypertension: Secondary | ICD-10-CM | POA: Diagnosis not present

## 2015-08-26 DIAGNOSIS — M1711 Unilateral primary osteoarthritis, right knee: Secondary | ICD-10-CM | POA: Diagnosis not present

## 2015-08-26 DIAGNOSIS — Z79891 Long term (current) use of opiate analgesic: Secondary | ICD-10-CM | POA: Diagnosis not present

## 2015-08-26 DIAGNOSIS — S42291D Other displaced fracture of upper end of right humerus, subsequent encounter for fracture with routine healing: Secondary | ICD-10-CM | POA: Diagnosis not present

## 2015-08-26 NOTE — Telephone Encounter (Signed)
Sherri Murray from [facility] calling for verbal orders to continue OT.  Also needs an order for incontinence supplies.

## 2015-08-27 DIAGNOSIS — Z79891 Long term (current) use of opiate analgesic: Secondary | ICD-10-CM | POA: Diagnosis not present

## 2015-08-27 DIAGNOSIS — I10 Essential (primary) hypertension: Secondary | ICD-10-CM | POA: Diagnosis not present

## 2015-08-27 DIAGNOSIS — R32 Unspecified urinary incontinence: Secondary | ICD-10-CM | POA: Diagnosis not present

## 2015-08-27 DIAGNOSIS — Z9181 History of falling: Secondary | ICD-10-CM | POA: Diagnosis not present

## 2015-08-27 DIAGNOSIS — S42291D Other displaced fracture of upper end of right humerus, subsequent encounter for fracture with routine healing: Secondary | ICD-10-CM | POA: Diagnosis not present

## 2015-08-27 DIAGNOSIS — M1711 Unilateral primary osteoarthritis, right knee: Secondary | ICD-10-CM | POA: Diagnosis not present

## 2015-08-27 NOTE — Telephone Encounter (Signed)
Left message for Sherri Murray with [facility] with verbal okay to order incontinence supplies per Dr. Diona Browner.

## 2015-08-27 NOTE — Telephone Encounter (Signed)
Okay to give verbal order.

## 2015-08-28 ENCOUNTER — Other Ambulatory Visit: Payer: Self-pay

## 2015-08-28 DIAGNOSIS — Z1231 Encounter for screening mammogram for malignant neoplasm of breast: Secondary | ICD-10-CM

## 2015-08-29 DIAGNOSIS — I10 Essential (primary) hypertension: Secondary | ICD-10-CM | POA: Diagnosis not present

## 2015-08-29 DIAGNOSIS — S42291D Other displaced fracture of upper end of right humerus, subsequent encounter for fracture with routine healing: Secondary | ICD-10-CM | POA: Diagnosis not present

## 2015-08-29 DIAGNOSIS — R32 Unspecified urinary incontinence: Secondary | ICD-10-CM | POA: Diagnosis not present

## 2015-08-29 DIAGNOSIS — Z79891 Long term (current) use of opiate analgesic: Secondary | ICD-10-CM | POA: Diagnosis not present

## 2015-08-29 DIAGNOSIS — M1711 Unilateral primary osteoarthritis, right knee: Secondary | ICD-10-CM | POA: Diagnosis not present

## 2015-08-29 DIAGNOSIS — Z9181 History of falling: Secondary | ICD-10-CM | POA: Diagnosis not present

## 2015-09-02 DIAGNOSIS — S42291D Other displaced fracture of upper end of right humerus, subsequent encounter for fracture with routine healing: Secondary | ICD-10-CM | POA: Diagnosis not present

## 2015-09-02 DIAGNOSIS — M1711 Unilateral primary osteoarthritis, right knee: Secondary | ICD-10-CM | POA: Diagnosis not present

## 2015-09-02 DIAGNOSIS — I10 Essential (primary) hypertension: Secondary | ICD-10-CM | POA: Diagnosis not present

## 2015-09-02 DIAGNOSIS — Z79891 Long term (current) use of opiate analgesic: Secondary | ICD-10-CM | POA: Diagnosis not present

## 2015-09-02 DIAGNOSIS — R32 Unspecified urinary incontinence: Secondary | ICD-10-CM | POA: Diagnosis not present

## 2015-09-02 DIAGNOSIS — Z9181 History of falling: Secondary | ICD-10-CM | POA: Diagnosis not present

## 2015-09-04 DIAGNOSIS — Z9181 History of falling: Secondary | ICD-10-CM | POA: Diagnosis not present

## 2015-09-04 DIAGNOSIS — I10 Essential (primary) hypertension: Secondary | ICD-10-CM | POA: Diagnosis not present

## 2015-09-04 DIAGNOSIS — R32 Unspecified urinary incontinence: Secondary | ICD-10-CM | POA: Diagnosis not present

## 2015-09-04 DIAGNOSIS — M1711 Unilateral primary osteoarthritis, right knee: Secondary | ICD-10-CM | POA: Diagnosis not present

## 2015-09-04 DIAGNOSIS — Z79891 Long term (current) use of opiate analgesic: Secondary | ICD-10-CM | POA: Diagnosis not present

## 2015-09-04 DIAGNOSIS — S42291D Other displaced fracture of upper end of right humerus, subsequent encounter for fracture with routine healing: Secondary | ICD-10-CM | POA: Diagnosis not present

## 2015-09-09 DIAGNOSIS — Z79891 Long term (current) use of opiate analgesic: Secondary | ICD-10-CM | POA: Diagnosis not present

## 2015-09-09 DIAGNOSIS — I10 Essential (primary) hypertension: Secondary | ICD-10-CM | POA: Diagnosis not present

## 2015-09-09 DIAGNOSIS — R32 Unspecified urinary incontinence: Secondary | ICD-10-CM | POA: Diagnosis not present

## 2015-09-09 DIAGNOSIS — S42291D Other displaced fracture of upper end of right humerus, subsequent encounter for fracture with routine healing: Secondary | ICD-10-CM | POA: Diagnosis not present

## 2015-09-09 DIAGNOSIS — Z9181 History of falling: Secondary | ICD-10-CM | POA: Diagnosis not present

## 2015-09-09 DIAGNOSIS — M1711 Unilateral primary osteoarthritis, right knee: Secondary | ICD-10-CM | POA: Diagnosis not present

## 2015-09-12 DIAGNOSIS — M1711 Unilateral primary osteoarthritis, right knee: Secondary | ICD-10-CM | POA: Diagnosis not present

## 2015-09-12 DIAGNOSIS — Z9181 History of falling: Secondary | ICD-10-CM | POA: Diagnosis not present

## 2015-09-12 DIAGNOSIS — Z79891 Long term (current) use of opiate analgesic: Secondary | ICD-10-CM | POA: Diagnosis not present

## 2015-09-12 DIAGNOSIS — I10 Essential (primary) hypertension: Secondary | ICD-10-CM | POA: Diagnosis not present

## 2015-09-12 DIAGNOSIS — R32 Unspecified urinary incontinence: Secondary | ICD-10-CM | POA: Diagnosis not present

## 2015-09-12 DIAGNOSIS — S42291D Other displaced fracture of upper end of right humerus, subsequent encounter for fracture with routine healing: Secondary | ICD-10-CM | POA: Diagnosis not present

## 2015-09-13 ENCOUNTER — Other Ambulatory Visit: Payer: Self-pay

## 2015-09-13 NOTE — Telephone Encounter (Signed)
Pt request refill tramadol to [facility]. Pt out of med. Pt last seen and rx printed # 90 x 0 on 07/26/15. Pt understands she may not get refill due to time of day calling but pt request refill if possible.

## 2015-09-16 DIAGNOSIS — Z79891 Long term (current) use of opiate analgesic: Secondary | ICD-10-CM | POA: Diagnosis not present

## 2015-09-16 DIAGNOSIS — I10 Essential (primary) hypertension: Secondary | ICD-10-CM | POA: Diagnosis not present

## 2015-09-16 DIAGNOSIS — M1711 Unilateral primary osteoarthritis, right knee: Secondary | ICD-10-CM | POA: Diagnosis not present

## 2015-09-16 DIAGNOSIS — R32 Unspecified urinary incontinence: Secondary | ICD-10-CM | POA: Diagnosis not present

## 2015-09-16 DIAGNOSIS — S42291D Other displaced fracture of upper end of right humerus, subsequent encounter for fracture with routine healing: Secondary | ICD-10-CM | POA: Diagnosis not present

## 2015-09-16 DIAGNOSIS — Z9181 History of falling: Secondary | ICD-10-CM | POA: Diagnosis not present

## 2015-09-16 MED ORDER — TRAMADOL HCL 50 MG PO TABS: 50.0000 mg | ORAL_TABLET | Freq: Three times a day (TID) | ORAL | Status: DC | PRN

## 2015-09-16 NOTE — Telephone Encounter (Signed)
Tramadol called into [facility] [address] 

## 2015-09-17 DIAGNOSIS — I10 Essential (primary) hypertension: Secondary | ICD-10-CM | POA: Diagnosis not present

## 2015-09-17 DIAGNOSIS — M1711 Unilateral primary osteoarthritis, right knee: Secondary | ICD-10-CM | POA: Diagnosis not present

## 2015-09-17 DIAGNOSIS — R32 Unspecified urinary incontinence: Secondary | ICD-10-CM | POA: Diagnosis not present

## 2015-09-17 DIAGNOSIS — Z79891 Long term (current) use of opiate analgesic: Secondary | ICD-10-CM

## 2015-09-17 DIAGNOSIS — S42291D Other displaced fracture of upper end of right humerus, subsequent encounter for fracture with routine healing: Secondary | ICD-10-CM | POA: Diagnosis not present

## 2015-09-17 DIAGNOSIS — Z9181 History of falling: Secondary | ICD-10-CM

## 2015-09-18 DIAGNOSIS — S42291D Other displaced fracture of upper end of right humerus, subsequent encounter for fracture with routine healing: Secondary | ICD-10-CM | POA: Diagnosis not present

## 2015-09-19 DIAGNOSIS — M1711 Unilateral primary osteoarthritis, right knee: Secondary | ICD-10-CM | POA: Diagnosis not present

## 2015-09-19 DIAGNOSIS — R32 Unspecified urinary incontinence: Secondary | ICD-10-CM | POA: Diagnosis not present

## 2015-09-19 DIAGNOSIS — Z9181 History of falling: Secondary | ICD-10-CM | POA: Diagnosis not present

## 2015-09-19 DIAGNOSIS — I10 Essential (primary) hypertension: Secondary | ICD-10-CM | POA: Diagnosis not present

## 2015-09-19 DIAGNOSIS — S42291D Other displaced fracture of upper end of right humerus, subsequent encounter for fracture with routine healing: Secondary | ICD-10-CM | POA: Diagnosis not present

## 2015-09-19 DIAGNOSIS — Z79891 Long term (current) use of opiate analgesic: Secondary | ICD-10-CM | POA: Diagnosis not present

## 2015-09-23 DIAGNOSIS — M1711 Unilateral primary osteoarthritis, right knee: Secondary | ICD-10-CM | POA: Diagnosis not present

## 2015-09-23 DIAGNOSIS — R32 Unspecified urinary incontinence: Secondary | ICD-10-CM | POA: Diagnosis not present

## 2015-09-23 DIAGNOSIS — I10 Essential (primary) hypertension: Secondary | ICD-10-CM | POA: Diagnosis not present

## 2015-09-23 DIAGNOSIS — Z79891 Long term (current) use of opiate analgesic: Secondary | ICD-10-CM | POA: Diagnosis not present

## 2015-09-23 DIAGNOSIS — S42291D Other displaced fracture of upper end of right humerus, subsequent encounter for fracture with routine healing: Secondary | ICD-10-CM | POA: Diagnosis not present

## 2015-09-23 DIAGNOSIS — Z9181 History of falling: Secondary | ICD-10-CM | POA: Diagnosis not present

## 2015-09-24 DIAGNOSIS — S72051A Unspecified fracture of head of right femur, initial encounter for closed fracture: Secondary | ICD-10-CM | POA: Diagnosis not present

## 2015-09-26 DIAGNOSIS — R32 Unspecified urinary incontinence: Secondary | ICD-10-CM | POA: Diagnosis not present

## 2015-09-26 DIAGNOSIS — S42291D Other displaced fracture of upper end of right humerus, subsequent encounter for fracture with routine healing: Secondary | ICD-10-CM | POA: Diagnosis not present

## 2015-09-26 DIAGNOSIS — M1711 Unilateral primary osteoarthritis, right knee: Secondary | ICD-10-CM | POA: Diagnosis not present

## 2015-09-26 DIAGNOSIS — Z79891 Long term (current) use of opiate analgesic: Secondary | ICD-10-CM | POA: Diagnosis not present

## 2015-09-26 DIAGNOSIS — I10 Essential (primary) hypertension: Secondary | ICD-10-CM | POA: Diagnosis not present

## 2015-09-26 DIAGNOSIS — Z9181 History of falling: Secondary | ICD-10-CM | POA: Diagnosis not present

## 2015-09-30 DIAGNOSIS — I10 Essential (primary) hypertension: Secondary | ICD-10-CM | POA: Diagnosis not present

## 2015-09-30 DIAGNOSIS — M1711 Unilateral primary osteoarthritis, right knee: Secondary | ICD-10-CM | POA: Diagnosis not present

## 2015-09-30 DIAGNOSIS — S42291D Other displaced fracture of upper end of right humerus, subsequent encounter for fracture with routine healing: Secondary | ICD-10-CM | POA: Diagnosis not present

## 2015-09-30 DIAGNOSIS — R32 Unspecified urinary incontinence: Secondary | ICD-10-CM | POA: Diagnosis not present

## 2015-09-30 DIAGNOSIS — Z9181 History of falling: Secondary | ICD-10-CM | POA: Diagnosis not present

## 2015-09-30 DIAGNOSIS — Z79891 Long term (current) use of opiate analgesic: Secondary | ICD-10-CM | POA: Diagnosis not present

## 2015-10-04 ENCOUNTER — Ambulatory Visit
Admission: RE | Admit: 2015-10-04 | Discharge: 2015-10-04 | Disposition: A | Discharge status: Home / Self Care | Payer: Medicare Other | Source: Ambulatory Visit

## 2015-10-04 DIAGNOSIS — Z1231 Encounter for screening mammogram for malignant neoplasm of breast: Secondary | ICD-10-CM

## 2015-10-09 DIAGNOSIS — Z9181 History of falling: Secondary | ICD-10-CM | POA: Diagnosis not present

## 2015-10-09 DIAGNOSIS — Z79891 Long term (current) use of opiate analgesic: Secondary | ICD-10-CM | POA: Diagnosis not present

## 2015-10-09 DIAGNOSIS — I10 Essential (primary) hypertension: Secondary | ICD-10-CM | POA: Diagnosis not present

## 2015-10-09 DIAGNOSIS — M1711 Unilateral primary osteoarthritis, right knee: Secondary | ICD-10-CM | POA: Diagnosis not present

## 2015-10-09 DIAGNOSIS — R32 Unspecified urinary incontinence: Secondary | ICD-10-CM | POA: Diagnosis not present

## 2015-10-09 DIAGNOSIS — S42291D Other displaced fracture of upper end of right humerus, subsequent encounter for fracture with routine healing: Secondary | ICD-10-CM | POA: Diagnosis not present

## 2015-10-12 DIAGNOSIS — M1711 Unilateral primary osteoarthritis, right knee: Secondary | ICD-10-CM | POA: Diagnosis not present

## 2015-10-12 DIAGNOSIS — S42291D Other displaced fracture of upper end of right humerus, subsequent encounter for fracture with routine healing: Secondary | ICD-10-CM | POA: Diagnosis not present

## 2015-10-12 DIAGNOSIS — Z9181 History of falling: Secondary | ICD-10-CM | POA: Diagnosis not present

## 2015-10-12 DIAGNOSIS — I10 Essential (primary) hypertension: Secondary | ICD-10-CM | POA: Diagnosis not present

## 2015-10-12 DIAGNOSIS — Z79891 Long term (current) use of opiate analgesic: Secondary | ICD-10-CM | POA: Diagnosis not present

## 2015-10-12 DIAGNOSIS — R32 Unspecified urinary incontinence: Secondary | ICD-10-CM | POA: Diagnosis not present

## 2015-10-15 ENCOUNTER — Other Ambulatory Visit: Payer: Medicare Other

## 2015-10-15 ENCOUNTER — Telehealth: Payer: Self-pay | Admitting: Family Medicine

## 2015-10-15 DIAGNOSIS — M1711 Unilateral primary osteoarthritis, right knee: Secondary | ICD-10-CM | POA: Diagnosis not present

## 2015-10-15 DIAGNOSIS — Z9181 History of falling: Secondary | ICD-10-CM | POA: Diagnosis not present

## 2015-10-15 DIAGNOSIS — S42291D Other displaced fracture of upper end of right humerus, subsequent encounter for fracture with routine healing: Secondary | ICD-10-CM | POA: Diagnosis not present

## 2015-10-15 DIAGNOSIS — R7303 Prediabetes: Secondary | ICD-10-CM

## 2015-10-15 DIAGNOSIS — I10 Essential (primary) hypertension: Secondary | ICD-10-CM | POA: Diagnosis not present

## 2015-10-15 DIAGNOSIS — D62 Acute posthemorrhagic anemia: Secondary | ICD-10-CM

## 2015-10-15 DIAGNOSIS — E890 Postprocedural hypothyroidism: Secondary | ICD-10-CM

## 2015-10-15 DIAGNOSIS — E7800 Pure hypercholesterolemia, unspecified: Secondary | ICD-10-CM

## 2015-10-15 DIAGNOSIS — Z79891 Long term (current) use of opiate analgesic: Secondary | ICD-10-CM | POA: Diagnosis not present

## 2015-10-15 NOTE — Telephone Encounter (Signed)
-----   Message from Marchia Bond sent at 10/08/2015  9:35 AM EST ----- Regarding: Cpx labs Tues 1/3, need orders. Thanks:-) Please order  future cpx labs for pt's upcoming lab appt. Thanks Aniceto Boss

## 2015-10-17 DIAGNOSIS — S42291D Other displaced fracture of upper end of right humerus, subsequent encounter for fracture with routine healing: Secondary | ICD-10-CM | POA: Diagnosis not present

## 2015-10-17 DIAGNOSIS — M1711 Unilateral primary osteoarthritis, right knee: Secondary | ICD-10-CM | POA: Diagnosis not present

## 2015-10-17 DIAGNOSIS — Z79891 Long term (current) use of opiate analgesic: Secondary | ICD-10-CM | POA: Diagnosis not present

## 2015-10-17 DIAGNOSIS — I10 Essential (primary) hypertension: Secondary | ICD-10-CM | POA: Diagnosis not present

## 2015-10-17 DIAGNOSIS — Z9181 History of falling: Secondary | ICD-10-CM | POA: Diagnosis not present

## 2015-10-18 ENCOUNTER — Ambulatory Visit (INDEPENDENT_AMBULATORY_CARE_PROVIDER_SITE_OTHER): Payer: Medicare Other | Admitting: Family Medicine

## 2015-10-18 ENCOUNTER — Encounter: Payer: Self-pay | Admitting: Family Medicine

## 2015-10-18 VITALS — BP 120/80 | HR 63 | Temp 97.6°F | Ht 64.0 in | Wt 163.0 lb

## 2015-10-18 DIAGNOSIS — K589 Irritable bowel syndrome without diarrhea: Secondary | ICD-10-CM | POA: Diagnosis not present

## 2015-10-18 DIAGNOSIS — Z0000 Encounter for general adult medical examination without abnormal findings: Secondary | ICD-10-CM

## 2015-10-18 DIAGNOSIS — Z1211 Encounter for screening for malignant neoplasm of colon: Secondary | ICD-10-CM | POA: Insufficient documentation

## 2015-10-18 DIAGNOSIS — R7303 Prediabetes: Secondary | ICD-10-CM | POA: Diagnosis not present

## 2015-10-18 DIAGNOSIS — I10 Essential (primary) hypertension: Secondary | ICD-10-CM | POA: Diagnosis not present

## 2015-10-18 DIAGNOSIS — E7800 Pure hypercholesterolemia, unspecified: Secondary | ICD-10-CM | POA: Diagnosis not present

## 2015-10-18 DIAGNOSIS — Z7189 Other specified counseling: Secondary | ICD-10-CM | POA: Insufficient documentation

## 2015-10-18 MED ORDER — DICYCLOMINE HCL 10 MG PO CAPS: 10.0000 mg | ORAL_CAPSULE | Freq: Three times a day (TID) | ORAL | Status: DC

## 2015-10-18 NOTE — Assessment & Plan Note (Signed)
Will try trial of bentyl. Work on healthy low fat diet.

## 2015-10-18 NOTE — Assessment & Plan Note (Signed)
Well controlled. Continue current medication.  

## 2015-10-18 NOTE — Assessment & Plan Note (Signed)
Due for eval. 

## 2015-10-18 NOTE — Progress Notes (Signed)
Pre visit review using our clinic review tool, if applicable. No additional management support is needed unless otherwise documented below in the visit note. 

## 2015-10-18 NOTE — Assessment & Plan Note (Signed)
Due for re-eval. 

## 2015-10-18 NOTE — Progress Notes (Signed)
HPI I have personally reviewed the Medicare Annual Wellness questionnaire and have noted 1. The patient's medical and social history 2. Their use of alcohol, tobacco or illicit drugs 3. Their current medications and supplements 4. The patient's functional ability including ADL's, fall risks, home safety risks and hearing or visual             impairment. 5. Diet and physical activities 6. Evidence for depression or mood disorders 7.         Updated provider list Cognitive evaluation was performed and recorded on pt medicare questionnaire form. The patients weight, height, BMI and visual acuity have been recorded in the chart  I have made referrals, counseling and provided education to the patient based review of the above and I have provided the pt with a written personalized care plan for preventive services.   Hypertension:   well controlled.  BP Readings from Last 3 Encounters:  10/18/15 120/80  07/26/15 130/75  07/05/15 139/77  Using medication without problems or lightheadedness: None Chest pain with exertion:none Edema:none Short of breath:none Average home BPs: not checking lately Other issues:  Elevated Cholesterol:due for re-eval. Lab Results  Component Value Date   CHOL 236* 12/07/2014   HDL 55.40 12/07/2014   LDLCALC 164* 12/07/2014   LDLDIRECT 136.1 11/02/2013   TRIG 83.0 12/07/2014   CHOLHDL 4 12/07/2014  Using medications without problems: Muscle aches:  Diet compliance: Exercise: Other complaints:  Prediabetes: Due for re-eval.  She is currently being treated for humeral head fracture and knee injury with falls. She has upcoming appt with ORTHO on 10/30/2015. Will likely need surgery for both issues.  Currently in PT. Using cane as needed for balance.  Asthma,Allergic rhinitis: Followed by Dr. Rhetta Mura allergist.  GERD, no longer on protonix.  She occ has reflux with above GI issues.  IBS: Was given a med ? Bentyl to take to help with abdominal pain  cramping, no d, no c.  Urinary urge incon: Uro, Dr. Jacqlyn Larsen as Mybetriq is not helping. Plans procedure.   Arthralgis, ? Fibromyalgia: Followed by Dr. Jefm Bryant. Recently had joint lubricant. May need surgery.  History   Social History  . Marital Status: Single    Spouse Name: N/A    Number of Children: 2  . Years of Education: HS   Occupational History  . Retired     used to work in [employer], Research scientist (physical sciences)   Social History Main Topics  . Smoking status: Never Smoker   . Smokeless tobacco: Never Used  . Alcohol Use: Yes     Comment: wine daily - 1 glass sometimes.  . Drug Use: No  . Sexual Activity: Not Currently    Partners: Male    Birth Control/ Protection: Post-menopausal     Comment: tubaligation   Other Topics Concern  . None   Social History Narrative   Divorced, Lives at home with her fiance, lives in [place].   Daily caffeine--coffee    Limited exercise   Healthy eating   Reviewed 2015 end of life planning, has HCPOA ( daughters), Full code                        Review of Systems  All other systems reviewed and are negative.      Objective:   Physical Exam  Constitutional: Vital signs are normal. She appears well-developed and well-nourished. She is cooperative. Non-toxic appearance. She does not appear ill. No distress.  HENT:  Head: Normocephalic.  Right Ear: Hearing, tympanic membrane, external ear and ear canal normal.  Left Ear: Hearing, tympanic membrane, external ear and ear canal normal.  Nose: Nose normal.  Eyes: Conjunctivae, EOM and lids are normal. Pupils are equal, round, and reactive to light. Lids are everted and swept, no foreign bodies found.  Neck: Trachea normal and normal range of motion. Neck supple. Carotid bruit is not present. No mass and no thyromegaly present.  Cardiovascular: Normal rate, regular rhythm, S1 normal,  S2 normal, normal heart sounds and intact distal pulses. Exam reveals no gallop.  No murmur heard. Pulmonary/Chest: Effort normal and breath sounds normal. No respiratory distress. She has no wheezes. She has no rhonchi. She has no rales.  Abdominal: Soft. Normal appearance and bowel sounds are normal. She exhibits no distension, no fluid wave, no abdominal bruit and no mass. There is no hepatosplenomegaly. There is no tenderness. There is no rebound, no guarding and no CVA tenderness. No hernia.  Genitourinary:  No breast swelling, tenderness, discharge or bleeding.  No pap  Lymphadenopathy:   She has no cervical adenopathy.   She has no axillary adenopathy.  Neurological: She is alert. She has normal strength. No cranial nerve deficit or sensory deficit.  Skin: Skin is warm, dry and intact. No rash noted.  Psychiatric: Her speech is normal and behavior is normal. Judgment normal. Her mood appears not anxious. Cognition and memory are normal. She does not exhibit a depressed mood.          Assessment & Plan:  The patient's preventative maintenance and recommended screening tests for an annual wellness exam were reviewed in full today. Brought up to date unless services declined.  Counselled on the importance of diet, exercise, and its role in overall health and mortality. The patient's FH and SH was reviewed, including their home life, tobacco status, and drug and alcohol status.   Vaccines:  Uptodate with shingles, PNA 13 and 23, Td and flu. Colon: ischemic colitis 2013, Dr. Carlean Purl Repeat in 10 years. DEXA: 12/22 osteopenia, repeat in 5 year MAMMO:12/27 neg DVE/PAP:not indicated, no vag bleeding.  Mother with colon cancer, no ovarian no uterine cancer.

## 2015-10-18 NOTE — Patient Instructions (Addendum)
Can use Anbesol for cold sore in mouth.  Schedule lab only visit on way put for  Fasting chol and CMET check.

## 2015-10-21 ENCOUNTER — Telehealth: Payer: Self-pay

## 2015-10-21 NOTE — Telephone Encounter (Signed)
[name] OT with [facility] left v/m requesting verbal orders to extend OT home heath therapy 2 x a week for 5 weeks. Also request new certification for incontinent supplies.

## 2015-10-22 NOTE — Telephone Encounter (Signed)
Left message for Sherri Murray with Verbal Order to extend OT 2 x a week for 5 weeks and new certification for incontinent supplies.

## 2015-10-22 NOTE — Telephone Encounter (Signed)
OK to give verbal order as requested.

## 2015-10-23 ENCOUNTER — Other Ambulatory Visit: Payer: Medicare Other

## 2015-10-24 DIAGNOSIS — M1711 Unilateral primary osteoarthritis, right knee: Secondary | ICD-10-CM | POA: Diagnosis not present

## 2015-10-24 DIAGNOSIS — I10 Essential (primary) hypertension: Secondary | ICD-10-CM | POA: Diagnosis not present

## 2015-10-24 DIAGNOSIS — S42291D Other displaced fracture of upper end of right humerus, subsequent encounter for fracture with routine healing: Secondary | ICD-10-CM | POA: Diagnosis not present

## 2015-10-24 DIAGNOSIS — Z9181 History of falling: Secondary | ICD-10-CM | POA: Diagnosis not present

## 2015-10-24 DIAGNOSIS — Z79891 Long term (current) use of opiate analgesic: Secondary | ICD-10-CM | POA: Diagnosis not present

## 2015-10-25 ENCOUNTER — Other Ambulatory Visit (INDEPENDENT_AMBULATORY_CARE_PROVIDER_SITE_OTHER): Payer: Medicare Other

## 2015-10-25 ENCOUNTER — Encounter: Payer: Self-pay | Admitting: *Deleted

## 2015-10-25 DIAGNOSIS — D62 Acute posthemorrhagic anemia: Secondary | ICD-10-CM

## 2015-10-25 DIAGNOSIS — E7800 Pure hypercholesterolemia, unspecified: Secondary | ICD-10-CM

## 2015-10-25 DIAGNOSIS — R7303 Prediabetes: Secondary | ICD-10-CM | POA: Diagnosis not present

## 2015-10-25 DIAGNOSIS — S72051A Unspecified fracture of head of right femur, initial encounter for closed fracture: Secondary | ICD-10-CM | POA: Diagnosis not present

## 2015-10-25 LAB — CBC WITH DIFFERENTIAL/PLATELET
BASOS ABS: 0 10*3/uL (ref 0.0–0.1)
Basophils Relative: 0.7 % (ref 0.0–3.0)
EOS ABS: 0.2 10*3/uL (ref 0.0–0.7)
EOS PCT: 3 % (ref 0.0–5.0)
HCT: 37.3 % (ref 36.0–46.0)
HEMOGLOBIN: 12.5 g/dL (ref 12.0–15.0)
LYMPHS ABS: 3 10*3/uL (ref 0.7–4.0)
Lymphocytes Relative: 48.5 % — ABNORMAL HIGH (ref 12.0–46.0)
MCHC: 33.4 g/dL (ref 30.0–36.0)
MCV: 90.6 fl (ref 78.0–100.0)
MONO ABS: 0.6 10*3/uL (ref 0.1–1.0)
Monocytes Relative: 9.3 % (ref 3.0–12.0)
NEUTROS PCT: 38.5 % — AB (ref 43.0–77.0)
Neutro Abs: 2.4 10*3/uL (ref 1.4–7.7)
Platelets: 250 10*3/uL (ref 150.0–400.0)
RBC: 4.12 Mil/uL (ref 3.87–5.11)
RDW: 13.5 % (ref 11.5–15.5)
WBC: 6.2 10*3/uL (ref 4.0–10.5)

## 2015-10-25 LAB — LIPID PANEL
CHOL/HDL RATIO: 4
Cholesterol: 196 mg/dL (ref 0–200)
HDL: 44.2 mg/dL (ref >39.00)
LDL Cholesterol: 124 mg/dL — ABNORMAL HIGH (ref 0–99)
NONHDL: 151.5
Triglycerides: 140 mg/dL (ref 0.0–149.0)
VLDL: 28 mg/dL (ref 0.0–40.0)

## 2015-10-25 LAB — COMPREHENSIVE METABOLIC PANEL
ALBUMIN: 3.8 g/dL (ref 3.5–5.2)
ALK PHOS: 72 U/L (ref 39–117)
ALT: 9 U/L (ref 0–35)
AST: 16 U/L (ref 0–37)
BUN: 11 mg/dL (ref 6–23)
CO2: 29 mEq/L (ref 19–32)
CREATININE: 0.71 mg/dL (ref 0.40–1.20)
Calcium: 9.3 mg/dL (ref 8.4–10.5)
Chloride: 103 mEq/L (ref 96–112)
GFR: 85.18 mL/min (ref >60.00)
GLUCOSE: 91 mg/dL (ref 70–99)
Potassium: 4.3 mEq/L (ref 3.5–5.1)
SODIUM: 136 meq/L (ref 135–145)
TOTAL PROTEIN: 6.8 g/dL (ref 6.0–8.3)
Total Bilirubin: 0.4 mg/dL (ref 0.2–1.2)

## 2015-10-25 LAB — HEMOGLOBIN A1C: HEMOGLOBIN A1C: 5.7 % (ref 4.6–6.5)

## 2015-10-29 ENCOUNTER — Other Ambulatory Visit: Payer: Self-pay | Admitting: *Deleted

## 2015-10-29 DIAGNOSIS — S42291D Other displaced fracture of upper end of right humerus, subsequent encounter for fracture with routine healing: Secondary | ICD-10-CM | POA: Diagnosis not present

## 2015-10-29 DIAGNOSIS — M1711 Unilateral primary osteoarthritis, right knee: Secondary | ICD-10-CM | POA: Diagnosis not present

## 2015-10-29 DIAGNOSIS — R32 Unspecified urinary incontinence: Secondary | ICD-10-CM | POA: Diagnosis not present

## 2015-10-29 DIAGNOSIS — I10 Essential (primary) hypertension: Secondary | ICD-10-CM | POA: Diagnosis not present

## 2015-10-29 MED ORDER — RANITIDINE HCL 150 MG PO TABS: 150.0000 mg | ORAL_TABLET | Freq: Two times a day (BID) | ORAL | Status: DC

## 2015-10-30 DIAGNOSIS — S2231XD Fracture of one rib, right side, subsequent encounter for fracture with routine healing: Secondary | ICD-10-CM | POA: Diagnosis not present

## 2015-10-30 DIAGNOSIS — M25561 Pain in right knee: Secondary | ICD-10-CM | POA: Diagnosis not present

## 2015-10-30 DIAGNOSIS — S42291D Other displaced fracture of upper end of right humerus, subsequent encounter for fracture with routine healing: Secondary | ICD-10-CM | POA: Diagnosis not present

## 2015-10-31 ENCOUNTER — Telehealth: Payer: Self-pay | Admitting: Family Medicine

## 2015-10-31 DIAGNOSIS — M1711 Unilateral primary osteoarthritis, right knee: Secondary | ICD-10-CM | POA: Diagnosis not present

## 2015-10-31 DIAGNOSIS — R32 Unspecified urinary incontinence: Secondary | ICD-10-CM | POA: Diagnosis not present

## 2015-10-31 DIAGNOSIS — S42291D Other displaced fracture of upper end of right humerus, subsequent encounter for fracture with routine healing: Secondary | ICD-10-CM | POA: Diagnosis not present

## 2015-10-31 DIAGNOSIS — I10 Essential (primary) hypertension: Secondary | ICD-10-CM | POA: Diagnosis not present

## 2015-10-31 NOTE — Telephone Encounter (Addendum)
Received fax.  Spoke with Ms. Stalvey and advised her not to order the Lidocaine Ointment from [facility].   Advised of other problems we have had in the past with these types of request and Ms. Werling ask that we deny this request. Advised she can get OTC lidocaine cream at her local pharmacy if she felt she really needed it.  Form marked at denied and faxed back to [place] at [phone].

## 2015-10-31 NOTE — Telephone Encounter (Signed)
There is an OTC lidocaine cream now that is only a few dollars --- I think 3 or 4%.

## 2015-10-31 NOTE — Telephone Encounter (Signed)
Emeyn from [facility] called requesting to see if you have seen request for Lidocaine ointment? Please advise Call back phone # [phone]

## 2015-11-04 DIAGNOSIS — M1711 Unilateral primary osteoarthritis, right knee: Secondary | ICD-10-CM | POA: Diagnosis not present

## 2015-11-04 DIAGNOSIS — I10 Essential (primary) hypertension: Secondary | ICD-10-CM | POA: Diagnosis not present

## 2015-11-04 DIAGNOSIS — S42291D Other displaced fracture of upper end of right humerus, subsequent encounter for fracture with routine healing: Secondary | ICD-10-CM | POA: Diagnosis not present

## 2015-11-04 DIAGNOSIS — R32 Unspecified urinary incontinence: Secondary | ICD-10-CM | POA: Diagnosis not present

## 2015-11-05 ENCOUNTER — Other Ambulatory Visit: Payer: Self-pay | Admitting: Family Medicine

## 2015-11-05 NOTE — Telephone Encounter (Signed)
Last office visit 10/18/2015.  Last refilled 09/16/2015 for #90 with no refills.  Ok to refill?

## 2015-11-06 DIAGNOSIS — I10 Essential (primary) hypertension: Secondary | ICD-10-CM | POA: Diagnosis not present

## 2015-11-06 DIAGNOSIS — S42291D Other displaced fracture of upper end of right humerus, subsequent encounter for fracture with routine healing: Secondary | ICD-10-CM | POA: Diagnosis not present

## 2015-11-06 DIAGNOSIS — R32 Unspecified urinary incontinence: Secondary | ICD-10-CM | POA: Diagnosis not present

## 2015-11-06 DIAGNOSIS — M1711 Unilateral primary osteoarthritis, right knee: Secondary | ICD-10-CM | POA: Diagnosis not present

## 2015-11-06 NOTE — Telephone Encounter (Signed)
[facility]ad[facility]. 

## 2015-11-11 DIAGNOSIS — M1711 Unilateral primary osteoarthritis, right knee: Secondary | ICD-10-CM | POA: Diagnosis not present

## 2015-11-11 DIAGNOSIS — I10 Essential (primary) hypertension: Secondary | ICD-10-CM | POA: Diagnosis not present

## 2015-11-11 DIAGNOSIS — R32 Unspecified urinary incontinence: Secondary | ICD-10-CM | POA: Diagnosis not present

## 2015-11-11 DIAGNOSIS — S42291D Other displaced fracture of upper end of right humerus, subsequent encounter for fracture with routine healing: Secondary | ICD-10-CM | POA: Diagnosis not present

## 2015-11-13 DIAGNOSIS — I10 Essential (primary) hypertension: Secondary | ICD-10-CM | POA: Diagnosis not present

## 2015-11-13 DIAGNOSIS — M1711 Unilateral primary osteoarthritis, right knee: Secondary | ICD-10-CM | POA: Diagnosis not present

## 2015-11-13 DIAGNOSIS — S42291D Other displaced fracture of upper end of right humerus, subsequent encounter for fracture with routine healing: Secondary | ICD-10-CM | POA: Diagnosis not present

## 2015-11-13 DIAGNOSIS — R32 Unspecified urinary incontinence: Secondary | ICD-10-CM | POA: Diagnosis not present

## 2015-11-18 DIAGNOSIS — S42291D Other displaced fracture of upper end of right humerus, subsequent encounter for fracture with routine healing: Secondary | ICD-10-CM | POA: Diagnosis not present

## 2015-11-18 DIAGNOSIS — R32 Unspecified urinary incontinence: Secondary | ICD-10-CM | POA: Diagnosis not present

## 2015-11-18 DIAGNOSIS — I10 Essential (primary) hypertension: Secondary | ICD-10-CM | POA: Diagnosis not present

## 2015-11-18 DIAGNOSIS — M1711 Unilateral primary osteoarthritis, right knee: Secondary | ICD-10-CM | POA: Diagnosis not present

## 2015-11-19 ENCOUNTER — Other Ambulatory Visit: Payer: Self-pay | Admitting: Family Medicine

## 2015-11-19 NOTE — Telephone Encounter (Signed)
Last office visit 10/18/2015.  Last refilled 10/18/2015 for #120 with no refills.  Ok to refill?

## 2015-11-21 ENCOUNTER — Other Ambulatory Visit: Payer: Self-pay | Admitting: Allergy and Immunology

## 2015-11-21 ENCOUNTER — Other Ambulatory Visit: Payer: Self-pay

## 2015-11-21 DIAGNOSIS — M1711 Unilateral primary osteoarthritis, right knee: Secondary | ICD-10-CM | POA: Diagnosis not present

## 2015-11-21 DIAGNOSIS — R32 Unspecified urinary incontinence: Secondary | ICD-10-CM | POA: Diagnosis not present

## 2015-11-21 DIAGNOSIS — I10 Essential (primary) hypertension: Secondary | ICD-10-CM | POA: Diagnosis not present

## 2015-11-21 DIAGNOSIS — S42291D Other displaced fracture of upper end of right humerus, subsequent encounter for fracture with routine healing: Secondary | ICD-10-CM | POA: Diagnosis not present

## 2015-11-25 DIAGNOSIS — R32 Unspecified urinary incontinence: Secondary | ICD-10-CM | POA: Diagnosis not present

## 2015-11-25 DIAGNOSIS — M1711 Unilateral primary osteoarthritis, right knee: Secondary | ICD-10-CM | POA: Diagnosis not present

## 2015-11-25 DIAGNOSIS — I10 Essential (primary) hypertension: Secondary | ICD-10-CM | POA: Diagnosis not present

## 2015-11-25 DIAGNOSIS — S42291D Other displaced fracture of upper end of right humerus, subsequent encounter for fracture with routine healing: Secondary | ICD-10-CM | POA: Diagnosis not present

## 2015-11-25 DIAGNOSIS — S72051A Unspecified fracture of head of right femur, initial encounter for closed fracture: Secondary | ICD-10-CM | POA: Diagnosis not present

## 2015-11-28 ENCOUNTER — Other Ambulatory Visit: Payer: Self-pay | Admitting: Orthopaedic Surgery

## 2015-11-28 DIAGNOSIS — M1711 Unilateral primary osteoarthritis, right knee: Secondary | ICD-10-CM | POA: Diagnosis not present

## 2015-11-28 DIAGNOSIS — I10 Essential (primary) hypertension: Secondary | ICD-10-CM | POA: Diagnosis not present

## 2015-11-28 DIAGNOSIS — R32 Unspecified urinary incontinence: Secondary | ICD-10-CM | POA: Diagnosis not present

## 2015-11-28 DIAGNOSIS — S42291D Other displaced fracture of upper end of right humerus, subsequent encounter for fracture with routine healing: Secondary | ICD-10-CM | POA: Diagnosis not present

## 2015-12-02 ENCOUNTER — Encounter (HOSPITAL_COMMUNITY): Payer: Self-pay

## 2015-12-02 ENCOUNTER — Encounter (HOSPITAL_COMMUNITY)
Admission: RE | Admit: 2015-12-02 | Discharge: 2015-12-02 | Disposition: A | Discharge status: Home / Self Care | Payer: Medicare Other | Source: Ambulatory Visit | Attending: Orthopaedic Surgery | Admitting: Orthopaedic Surgery

## 2015-12-02 ENCOUNTER — Ambulatory Visit (HOSPITAL_COMMUNITY)
Admission: RE | Admit: 2015-12-02 | Discharge: 2015-12-02 | Disposition: A | Discharge status: Home / Self Care | Payer: Medicare Other | Source: Ambulatory Visit | Attending: Orthopaedic Surgery | Admitting: Orthopaedic Surgery

## 2015-12-02 DIAGNOSIS — Z01818 Encounter for other preprocedural examination: Secondary | ICD-10-CM | POA: Diagnosis not present

## 2015-12-02 DIAGNOSIS — M179 Osteoarthritis of knee, unspecified: Secondary | ICD-10-CM

## 2015-12-02 DIAGNOSIS — M1710 Unilateral primary osteoarthritis, unspecified knee: Secondary | ICD-10-CM

## 2015-12-02 HISTORY — DX: Sleep apnea, unspecified: G47.30

## 2015-12-02 LAB — COMPREHENSIVE METABOLIC PANEL
ALT: 18 U/L (ref 14–54)
ANION GAP: 12 (ref 5–15)
AST: 25 U/L (ref 15–41)
Albumin: 4.1 g/dL (ref 3.5–5.0)
Alkaline Phosphatase: 71 U/L (ref 38–126)
BUN: 14 mg/dL (ref 6–20)
CHLORIDE: 101 mmol/L (ref 101–111)
CO2: 24 mmol/L (ref 22–32)
Calcium: 10.2 mg/dL (ref 8.9–10.3)
Creatinine, Ser: 0.85 mg/dL (ref 0.44–1.00)
Glucose, Bld: 99 mg/dL (ref 65–99)
POTASSIUM: 4.2 mmol/L (ref 3.5–5.1)
SODIUM: 137 mmol/L (ref 135–145)
Total Bilirubin: 0.4 mg/dL (ref 0.3–1.2)
Total Protein: 7.7 g/dL (ref 6.5–8.1)

## 2015-12-02 LAB — CBC
HCT: 39.9 % (ref 36.0–46.0)
Hemoglobin: 13.3 g/dL (ref 12.0–15.0)
MCH: 30.3 pg (ref 26.0–34.0)
MCHC: 33.3 g/dL (ref 30.0–36.0)
MCV: 90.9 fL (ref 78.0–100.0)
PLATELETS: 248 10*3/uL (ref 150–400)
RBC: 4.39 MIL/uL (ref 3.87–5.11)
RDW: 12.9 % (ref 11.5–15.5)
WBC: 6.4 10*3/uL (ref 4.0–10.5)

## 2015-12-02 LAB — URINALYSIS, ROUTINE W REFLEX MICROSCOPIC
Bilirubin Urine: NEGATIVE
GLUCOSE, UA: NEGATIVE mg/dL
Hgb urine dipstick: NEGATIVE
KETONES UR: NEGATIVE mg/dL
NITRITE: NEGATIVE
PROTEIN: NEGATIVE mg/dL
Specific Gravity, Urine: 1.012 (ref 1.005–1.030)
pH: 7 (ref 5.0–8.0)

## 2015-12-02 LAB — PROTIME-INR
INR: 1.06 (ref 0.00–1.49)
PROTHROMBIN TIME: 14 s (ref 11.6–15.2)

## 2015-12-02 LAB — URINE MICROSCOPIC-ADD ON

## 2015-12-02 LAB — SURGICAL PCR SCREEN
MRSA, PCR: NEGATIVE
STAPHYLOCOCCUS AUREUS: POSITIVE — AB

## 2015-12-02 LAB — APTT: aPTT: 29 seconds (ref 24–37)

## 2015-12-02 NOTE — Pre-Procedure Instructions (Signed)
    BRAILEE BOUDREAU  12/02/2015      [id number] [place] [place] - [address] [place] [address] Phone: [phone] Fax: [phone]    Your procedure is scheduled on 12/11/15.  Report to [facility] Admitting at 1030 A.M.  Call this number if you have problems the morning of surgery:  [phone]   Remember:  Do not eat food or drink liquids after midnight.  Take these medicines the morning of surgery with A SIP OF WATER --singular,protonix,ultram   Do not wear jewelry, make-up or nail polish.  Do not wear lotions, powders, or perfumes.  You may wear deodorant.  Do not shave 48 hours prior to surgery.  Men may shave face and neck.  Do not bring valuables to the hospital.  [facility] is not responsible for any belongings or valuables.  Contacts, dentures or bridgework may not be worn into surgery.  Leave your suitcase in the car.  After surgery it may be brought to your room.  For patients admitted to the hospital, discharge time will be determined by your treatment team.  Patients discharged the day of surgery will not be allowed to drive home.   Name and phone number of your driver:   Special instructions:    Please read over the following fact sheets that you were given. Pain Booklet, Coughing and Deep Breathing, MRSA Information and Surgical Site Infection Prevention

## 2015-12-11 ENCOUNTER — Inpatient Hospital Stay (HOSPITAL_COMMUNITY)
Admission: AD | Admit: 2015-12-11 | Discharge: 2015-12-13 | DRG: 470 | Disposition: A | Discharge status: Skilled Nursing Facility | Payer: Medicare Other | Source: Ambulatory Visit | Attending: Orthopaedic Surgery | Admitting: Orthopaedic Surgery

## 2015-12-11 ENCOUNTER — Inpatient Hospital Stay (HOSPITAL_COMMUNITY): Payer: Medicare Other | Admitting: Anesthesiology

## 2015-12-11 ENCOUNTER — Encounter (HOSPITAL_COMMUNITY): Payer: Self-pay | Admitting: *Deleted

## 2015-12-11 ENCOUNTER — Encounter (HOSPITAL_COMMUNITY)
Admission: AD | Disposition: A | Discharge status: Skilled Nursing Facility | Payer: Self-pay | Source: Ambulatory Visit | Attending: Orthopaedic Surgery

## 2015-12-11 DIAGNOSIS — M1711 Unilateral primary osteoarthritis, right knee: Secondary | ICD-10-CM | POA: Diagnosis not present

## 2015-12-11 DIAGNOSIS — I10 Essential (primary) hypertension: Secondary | ICD-10-CM | POA: Diagnosis not present

## 2015-12-11 DIAGNOSIS — K219 Gastro-esophageal reflux disease without esophagitis: Secondary | ICD-10-CM | POA: Diagnosis present

## 2015-12-11 DIAGNOSIS — E785 Hyperlipidemia, unspecified: Secondary | ICD-10-CM | POA: Diagnosis not present

## 2015-12-11 DIAGNOSIS — M179 Osteoarthritis of knee, unspecified: Secondary | ICD-10-CM | POA: Diagnosis not present

## 2015-12-11 DIAGNOSIS — K589 Irritable bowel syndrome without diarrhea: Secondary | ICD-10-CM | POA: Diagnosis not present

## 2015-12-11 DIAGNOSIS — E7800 Pure hypercholesterolemia, unspecified: Secondary | ICD-10-CM | POA: Diagnosis not present

## 2015-12-11 DIAGNOSIS — Z471 Aftercare following joint replacement surgery: Secondary | ICD-10-CM | POA: Diagnosis not present

## 2015-12-11 DIAGNOSIS — E781 Pure hyperglyceridemia: Secondary | ICD-10-CM | POA: Diagnosis not present

## 2015-12-11 DIAGNOSIS — E890 Postprocedural hypothyroidism: Secondary | ICD-10-CM | POA: Diagnosis not present

## 2015-12-11 DIAGNOSIS — M6281 Muscle weakness (generalized): Secondary | ICD-10-CM | POA: Diagnosis not present

## 2015-12-11 DIAGNOSIS — G4730 Sleep apnea, unspecified: Secondary | ICD-10-CM | POA: Diagnosis present

## 2015-12-11 DIAGNOSIS — M25569 Pain in unspecified knee: Secondary | ICD-10-CM | POA: Diagnosis not present

## 2015-12-11 DIAGNOSIS — R2689 Other abnormalities of gait and mobility: Secondary | ICD-10-CM | POA: Diagnosis not present

## 2015-12-11 DIAGNOSIS — Z96651 Presence of right artificial knee joint: Secondary | ICD-10-CM

## 2015-12-11 DIAGNOSIS — M25561 Pain in right knee: Secondary | ICD-10-CM | POA: Diagnosis present

## 2015-12-11 DIAGNOSIS — D500 Iron deficiency anemia secondary to blood loss (chronic): Secondary | ICD-10-CM | POA: Diagnosis not present

## 2015-12-11 HISTORY — PX: KNEE ARTHROPLASTY: SHX992

## 2015-12-11 SURGERY — ARTHROPLASTY, KNEE, TOTAL, USING IMAGELESS COMPUTER-ASSISTED NAVIGATION
Anesthesia: General | Laterality: Right

## 2015-12-11 MED ORDER — LOSARTAN POTASSIUM-HCTZ 50-12.5 MG PO TABS
1.0000 | ORAL_TABLET | Freq: Every day | ORAL | Status: DC
  Filled 2015-12-11: qty 1

## 2015-12-11 MED ORDER — KETOROLAC TROMETHAMINE 30 MG/ML IJ SOLN
30.0000 mg | Freq: Once | INTRAMUSCULAR | Status: AC
  Administered 2015-12-11: 30 mg via INTRAVENOUS

## 2015-12-11 MED ORDER — HYDROCODONE-ACETAMINOPHEN 7.5-325 MG PO TABS: 1.0000 | ORAL_TABLET | Freq: Once | ORAL | Status: DC | PRN

## 2015-12-11 MED ORDER — METOCLOPRAMIDE HCL 5 MG PO TABS: 5.0000 mg | ORAL_TABLET | Freq: Three times a day (TID) | ORAL | Status: DC | PRN

## 2015-12-11 MED ORDER — PROPOFOL 10 MG/ML IV BOLUS
INTRAVENOUS | Status: AC
  Filled 2015-12-11: qty 20

## 2015-12-11 MED ORDER — CEFAZOLIN SODIUM-DEXTROSE 2-3 GM-% IV SOLR
2.0000 g | INTRAVENOUS | Status: AC
Start: 2015-12-11 — End: 2015-12-11
  Administered 2015-12-11: 2 g via INTRAVENOUS
  Filled 2015-12-11: qty 50

## 2015-12-11 MED ORDER — ARTIFICIAL TEARS OP OINT
TOPICAL_OINTMENT | OPHTHALMIC | Status: DC | PRN
  Administered 2015-12-11: 1 via OPHTHALMIC

## 2015-12-11 MED ORDER — SODIUM CHLORIDE 0.45 % IV SOLN
INTRAVENOUS | Status: DC
  Administered 2015-12-11 – 2015-12-12 (×2): via INTRAVENOUS
  Administered 2015-12-13: 1000 mL via INTRAVENOUS

## 2015-12-11 MED ORDER — SUGAMMADEX SODIUM 200 MG/2ML IV SOLN
INTRAVENOUS | Status: DC | PRN
  Administered 2015-12-11: 200 mg via INTRAVENOUS

## 2015-12-11 MED ORDER — DICYCLOMINE HCL 10 MG PO CAPS
10.0000 mg | ORAL_CAPSULE | Freq: Three times a day (TID) | ORAL | Status: DC
  Administered 2015-12-11 – 2015-12-13 (×6): 10 mg via ORAL
  Filled 2015-12-11 (×6): qty 1

## 2015-12-11 MED ORDER — ALBUTEROL SULFATE HFA 108 (90 BASE) MCG/ACT IN AERS: 1.0000 | INHALATION_SPRAY | Freq: Four times a day (QID) | RESPIRATORY_TRACT | Status: DC | PRN

## 2015-12-11 MED ORDER — MONTELUKAST SODIUM 10 MG PO TABS
10.0000 mg | ORAL_TABLET | Freq: Every day | ORAL | Status: DC
  Administered 2015-12-11 – 2015-12-12 (×2): 10 mg via ORAL
  Filled 2015-12-11 (×2): qty 1

## 2015-12-11 MED ORDER — KETOROLAC TROMETHAMINE 30 MG/ML IJ SOLN
INTRAMUSCULAR | Status: AC
  Administered 2015-12-11: 30 mg via INTRAVENOUS
  Filled 2015-12-11: qty 1

## 2015-12-11 MED ORDER — PHENOL 1.4 % MT LIQD: 1.0000 | OROMUCOSAL | Status: DC | PRN

## 2015-12-11 MED ORDER — EPHEDRINE SULFATE 50 MG/ML IJ SOLN
INTRAMUSCULAR | Status: DC | PRN
  Administered 2015-12-11 (×3): 10 mg via INTRAVENOUS

## 2015-12-11 MED ORDER — METHOCARBAMOL 500 MG PO TABS
500.0000 mg | ORAL_TABLET | Freq: Four times a day (QID) | ORAL | Status: DC | PRN
  Administered 2015-12-11 – 2015-12-13 (×6): 500 mg via ORAL
  Filled 2015-12-11 (×5): qty 1

## 2015-12-11 MED ORDER — PANTOPRAZOLE SODIUM 40 MG PO TBEC
40.0000 mg | DELAYED_RELEASE_TABLET | Freq: Every day | ORAL | Status: DC
  Administered 2015-12-11 – 2015-12-13 (×3): 40 mg via ORAL
  Filled 2015-12-11 (×3): qty 1

## 2015-12-11 MED ORDER — SODIUM CHLORIDE 0.9 % IR SOLN
Status: DC | PRN
  Administered 2015-12-11: 3000 mL

## 2015-12-11 MED ORDER — OXYCODONE HCL 5 MG PO TABS
ORAL_TABLET | ORAL | Status: AC
  Filled 2015-12-11: qty 1

## 2015-12-11 MED ORDER — ALBUTEROL SULFATE (2.5 MG/3ML) 0.083% IN NEBU: 2.5000 mg | INHALATION_SOLUTION | Freq: Four times a day (QID) | RESPIRATORY_TRACT | Status: DC | PRN

## 2015-12-11 MED ORDER — FAMOTIDINE 20 MG PO TABS
10.0000 mg | ORAL_TABLET | Freq: Every day | ORAL | Status: DC
  Administered 2015-12-11 – 2015-12-13 (×3): 10 mg via ORAL
  Filled 2015-12-11 (×3): qty 1

## 2015-12-11 MED ORDER — MIDAZOLAM HCL 2 MG/2ML IJ SOLN
1.0000 mg | INTRAMUSCULAR | Status: DC | PRN
  Administered 2015-12-11: 1 mg via INTRAVENOUS

## 2015-12-11 MED ORDER — FENTANYL CITRATE (PF) 100 MCG/2ML IJ SOLN
50.0000 ug | INTRAMUSCULAR | Status: AC | PRN
  Administered 2015-12-11 (×4): 50 ug via INTRAVENOUS
  Administered 2015-12-11: 100 ug via INTRAVENOUS

## 2015-12-11 MED ORDER — BUPIVACAINE HCL (PF) 0.25 % IJ SOLN
INTRAMUSCULAR | Status: AC
  Filled 2015-12-11: qty 30

## 2015-12-11 MED ORDER — MIDAZOLAM HCL 2 MG/2ML IJ SOLN
INTRAMUSCULAR | Status: AC
  Filled 2015-12-11: qty 2

## 2015-12-11 MED ORDER — LIDOCAINE HCL (CARDIAC) 20 MG/ML IV SOLN
INTRAVENOUS | Status: DC | PRN
  Administered 2015-12-11: 100 mg via INTRAVENOUS

## 2015-12-11 MED ORDER — ROCURONIUM BROMIDE 100 MG/10ML IV SOLN
INTRAVENOUS | Status: DC | PRN
  Administered 2015-12-11: 50 mg via INTRAVENOUS

## 2015-12-11 MED ORDER — LIDOCAINE HCL (CARDIAC) 20 MG/ML IV SOLN
INTRAVENOUS | Status: AC
  Filled 2015-12-11: qty 5

## 2015-12-11 MED ORDER — CEFAZOLIN SODIUM 1-5 GM-% IV SOLN
1.0000 g | Freq: Three times a day (TID) | INTRAVENOUS | Status: AC
  Administered 2015-12-11 – 2015-12-12 (×2): 1 g via INTRAVENOUS
  Filled 2015-12-11 (×2): qty 50

## 2015-12-11 MED ORDER — MIDAZOLAM HCL 2 MG/2ML IJ SOLN
INTRAMUSCULAR | Status: AC
  Administered 2015-12-11: 1 mg via INTRAVENOUS
  Filled 2015-12-11: qty 2

## 2015-12-11 MED ORDER — METHOCARBAMOL 1000 MG/10ML IJ SOLN: 500.0000 mg | Freq: Four times a day (QID) | INTRAVENOUS | Status: DC | PRN

## 2015-12-11 MED ORDER — METOCLOPRAMIDE HCL 5 MG/ML IJ SOLN
5.0000 mg | Freq: Three times a day (TID) | INTRAMUSCULAR | Status: DC | PRN
Start: 2015-12-11 — End: 2015-12-13

## 2015-12-11 MED ORDER — FENTANYL CITRATE (PF) 100 MCG/2ML IJ SOLN
INTRAMUSCULAR | Status: AC
  Administered 2015-12-11: 50 ug via INTRAVENOUS
  Filled 2015-12-11: qty 2

## 2015-12-11 MED ORDER — LACTATED RINGERS IV SOLN
INTRAVENOUS | Status: DC | PRN
  Administered 2015-12-11: 12:00:00 via INTRAVENOUS

## 2015-12-11 MED ORDER — ACETAMINOPHEN 650 MG RE SUPP: 650.0000 mg | Freq: Four times a day (QID) | RECTAL | Status: DC | PRN

## 2015-12-11 MED ORDER — ACETAMINOPHEN 325 MG PO TABS: 650.0000 mg | ORAL_TABLET | Freq: Four times a day (QID) | ORAL | Status: DC | PRN

## 2015-12-11 MED ORDER — GLYCOPYRROLATE 0.2 MG/ML IJ SOLN
INTRAMUSCULAR | Status: DC | PRN
  Administered 2015-12-11: 0.2 mg via INTRAVENOUS

## 2015-12-11 MED ORDER — HYDROMORPHONE HCL 1 MG/ML IJ SOLN
INTRAMUSCULAR | Status: AC
  Administered 2015-12-11: 0.5 mg via INTRAVENOUS
  Filled 2015-12-11: qty 1

## 2015-12-11 MED ORDER — BUPIVACAINE-EPINEPHRINE (PF) 0.5% -1:200000 IJ SOLN
INTRAMUSCULAR | Status: DC | PRN
  Administered 2015-12-11: 30 mL via PERINEURAL

## 2015-12-11 MED ORDER — ASPIRIN EC 325 MG PO TBEC
325.0000 mg | DELAYED_RELEASE_TABLET | Freq: Every day | ORAL | Status: DC
  Administered 2015-12-12 – 2015-12-13 (×2): 325 mg via ORAL
  Filled 2015-12-11 (×2): qty 1

## 2015-12-11 MED ORDER — FENTANYL CITRATE (PF) 250 MCG/5ML IJ SOLN
INTRAMUSCULAR | Status: AC
  Filled 2015-12-11: qty 5

## 2015-12-11 MED ORDER — ONDANSETRON HCL 4 MG PO TABS: 4.0000 mg | ORAL_TABLET | Freq: Four times a day (QID) | ORAL | Status: DC | PRN

## 2015-12-11 MED ORDER — ONDANSETRON HCL 4 MG/2ML IJ SOLN
INTRAMUSCULAR | Status: AC
  Filled 2015-12-11: qty 2

## 2015-12-11 MED ORDER — OXYCODONE HCL 5 MG PO TABS
5.0000 mg | ORAL_TABLET | ORAL | Status: DC | PRN
  Administered 2015-12-11 (×2): 5 mg via ORAL
  Administered 2015-12-12 – 2015-12-13 (×9): 10 mg via ORAL
  Filled 2015-12-11 (×3): qty 2
  Filled 2015-12-11: qty 1
  Filled 2015-12-11 (×6): qty 2

## 2015-12-11 MED ORDER — MIDAZOLAM HCL 5 MG/5ML IJ SOLN
INTRAMUSCULAR | Status: DC | PRN
  Administered 2015-12-11 (×2): 1 mg via INTRAVENOUS

## 2015-12-11 MED ORDER — 0.9 % SODIUM CHLORIDE (POUR BTL) OPTIME
TOPICAL | Status: DC | PRN
  Administered 2015-12-11: 1000 mL

## 2015-12-11 MED ORDER — POLYETHYLENE GLYCOL 3350 17 G PO PACK: 17.0000 g | PACK | Freq: Every day | ORAL | Status: DC | PRN

## 2015-12-11 MED ORDER — ONDANSETRON HCL 4 MG/2ML IJ SOLN
INTRAMUSCULAR | Status: DC | PRN
  Administered 2015-12-11: 4 mg via INTRAVENOUS

## 2015-12-11 MED ORDER — CHLORHEXIDINE GLUCONATE 4 % EX LIQD: 60.0000 mL | Freq: Once | CUTANEOUS | Status: DC

## 2015-12-11 MED ORDER — PROMETHAZINE HCL 25 MG/ML IJ SOLN: 6.2500 mg | INTRAMUSCULAR | Status: DC | PRN

## 2015-12-11 MED ORDER — LORATADINE 10 MG PO TABS: 10.0000 mg | ORAL_TABLET | Freq: Two times a day (BID) | ORAL | Status: DC | PRN

## 2015-12-11 MED ORDER — HYDROMORPHONE HCL 1 MG/ML IJ SOLN
0.2500 mg | INTRAMUSCULAR | Status: DC | PRN
  Administered 2015-12-11 (×4): 0.5 mg via INTRAVENOUS

## 2015-12-11 MED ORDER — EPINEPHRINE 0.15 MG/0.3ML IJ SOAJ: 0.1500 mg | Freq: Every day | INTRAMUSCULAR | Status: DC | PRN

## 2015-12-11 MED ORDER — HYDROMORPHONE HCL 1 MG/ML IJ SOLN
INTRAMUSCULAR | Status: AC
  Filled 2015-12-11: qty 1

## 2015-12-11 MED ORDER — LACTATED RINGERS IV SOLN
INTRAVENOUS | Status: DC
  Administered 2015-12-11: 11:00:00 via INTRAVENOUS

## 2015-12-11 MED ORDER — METHOCARBAMOL 500 MG PO TABS
ORAL_TABLET | ORAL | Status: AC
  Filled 2015-12-11: qty 1

## 2015-12-11 MED ORDER — MENTHOL 3 MG MT LOZG: 1.0000 | LOZENGE | OROMUCOSAL | Status: DC | PRN

## 2015-12-11 MED ORDER — DOCUSATE SODIUM 100 MG PO CAPS
100.0000 mg | ORAL_CAPSULE | Freq: Two times a day (BID) | ORAL | Status: DC
  Administered 2015-12-11 – 2015-12-13 (×4): 100 mg via ORAL
  Filled 2015-12-11 (×4): qty 1

## 2015-12-11 MED ORDER — ONDANSETRON HCL 4 MG/2ML IJ SOLN
4.0000 mg | Freq: Four times a day (QID) | INTRAMUSCULAR | Status: DC | PRN
  Administered 2015-12-11 – 2015-12-12 (×2): 4 mg via INTRAVENOUS
  Filled 2015-12-11 (×2): qty 2

## 2015-12-11 MED ORDER — OXYBUTYNIN CHLORIDE ER 10 MG PO TB24
10.0000 mg | ORAL_TABLET | Freq: Every day | ORAL | Status: DC
  Administered 2015-12-11 – 2015-12-12 (×2): 10 mg via ORAL
  Filled 2015-12-11 (×2): qty 1

## 2015-12-11 MED ORDER — HYDROMORPHONE HCL 1 MG/ML IJ SOLN
0.5000 mg | INTRAMUSCULAR | Status: DC | PRN
  Administered 2015-12-11 – 2015-12-12 (×2): 1 mg via INTRAVENOUS
  Administered 2015-12-12: 0.5 mg via INTRAVENOUS
  Filled 2015-12-11 (×3): qty 1

## 2015-12-11 MED ORDER — PROPOFOL 10 MG/ML IV BOLUS
INTRAVENOUS | Status: DC | PRN
  Administered 2015-12-11: 150 mg via INTRAVENOUS

## 2015-12-11 MED ORDER — HYDROMORPHONE HCL 1 MG/ML IJ SOLN
INTRAMUSCULAR | Status: DC | PRN
  Administered 2015-12-11 (×2): 0.5 mg via INTRAVENOUS

## 2015-12-11 SURGICAL SUPPLY — 68 items
BANDAGE ACE 6X5 VEL STRL LF (GAUZE/BANDAGES/DRESSINGS) ×2 IMPLANT
BANDAGE ELASTIC 4 VELCRO ST LF (GAUZE/BANDAGES/DRESSINGS) ×2 IMPLANT
BANDAGE ESMARK 6X9 LF (GAUZE/BANDAGES/DRESSINGS) ×1 IMPLANT
BENZOIN TINCTURE PRP APPL 2/3 (GAUZE/BANDAGES/DRESSINGS) ×2 IMPLANT
BLADE SAGITTAL 25.0X1.19X90 (BLADE) ×2 IMPLANT
BLADE SAW SGTL 13X75X1.27 (BLADE) ×2 IMPLANT
BNDG ELASTIC 6X10 VLCR STRL LF (GAUZE/BANDAGES/DRESSINGS) ×2 IMPLANT
BNDG ESMARK 6X9 LF (GAUZE/BANDAGES/DRESSINGS) ×2
BOWL SMART MIX CTS (DISPOSABLE) ×2 IMPLANT
CAP KNEE TOTAL 3 SIGMA ×2 IMPLANT
CEMENT HV SMART SET (Cement) ×4 IMPLANT
CLSR STERI-STRIP ANTIMIC 1/2X4 (GAUZE/BANDAGES/DRESSINGS) ×2 IMPLANT
COVER SURGICAL LIGHT HANDLE (MISCELLANEOUS) ×2 IMPLANT
CUFF TOURNIQUET SINGLE 34IN LL (TOURNIQUET CUFF) ×2 IMPLANT
CUFF TOURNIQUET SINGLE 44IN (TOURNIQUET CUFF) IMPLANT
DRAPE ORTHO SPLIT 77X108 STRL (DRAPES) ×2
DRAPE SURG ORHT 6 SPLT 77X108 (DRAPES) ×2 IMPLANT
DRAPE U-SHAPE 47X51 STRL (DRAPES) ×2 IMPLANT
DRSG PAD ABDOMINAL 8X10 ST (GAUZE/BANDAGES/DRESSINGS) ×4 IMPLANT
DURAPREP 26ML APPLICATOR (WOUND CARE) ×2 IMPLANT
ELECT REM PT RETURN 9FT ADLT (ELECTROSURGICAL) ×2
ELECTRODE REM PT RTRN 9FT ADLT (ELECTROSURGICAL) ×1 IMPLANT
FACESHIELD WRAPAROUND (MASK) ×4 IMPLANT
GAUZE SPONGE 4X4 12PLY STRL (GAUZE/BANDAGES/DRESSINGS) ×4 IMPLANT
GAUZE XEROFORM 5X9 LF (GAUZE/BANDAGES/DRESSINGS) IMPLANT
GLOVE BIOGEL PI IND STRL 6.5 (GLOVE) ×1 IMPLANT
GLOVE BIOGEL PI IND STRL 8 (GLOVE) ×2 IMPLANT
GLOVE BIOGEL PI INDICATOR 6.5 (GLOVE) ×1
GLOVE BIOGEL PI INDICATOR 8 (GLOVE) ×2
GLOVE ECLIPSE 6.5 STRL STRAW (GLOVE) ×2 IMPLANT
GLOVE ORTHO TXT STRL SZ7.5 (GLOVE) ×4 IMPLANT
GOWN STRL REUS W/ TWL LRG LVL3 (GOWN DISPOSABLE) ×1 IMPLANT
GOWN STRL REUS W/ TWL XL LVL3 (GOWN DISPOSABLE) ×2 IMPLANT
GOWN STRL REUS W/TWL 2XL LVL3 (GOWN DISPOSABLE) ×2 IMPLANT
GOWN STRL REUS W/TWL LRG LVL3 (GOWN DISPOSABLE) ×1
GOWN STRL REUS W/TWL XL LVL3 (GOWN DISPOSABLE) ×2
HANDPIECE INTERPULSE COAX TIP (DISPOSABLE) ×1
KIT BASIN OR (CUSTOM PROCEDURE TRAY) ×2 IMPLANT
KIT ROOM TURNOVER OR (KITS) ×2 IMPLANT
MANIFOLD NEPTUNE II (INSTRUMENTS) ×2 IMPLANT
MARKER SPHERE PSV REFLC THRD 5 (MARKER) ×8 IMPLANT
NEEDLE HYPO 25GX1X1/2 BEV (NEEDLE) ×2 IMPLANT
NS IRRIG 1000ML POUR BTL (IV SOLUTION) ×2 IMPLANT
PACK TOTAL JOINT (CUSTOM PROCEDURE TRAY) ×2 IMPLANT
PACK UNIVERSAL I (CUSTOM PROCEDURE TRAY) ×2 IMPLANT
PAD ARMBOARD 7.5X6 YLW CONV (MISCELLANEOUS) ×4 IMPLANT
PAD CAST 4YDX4 CTTN HI CHSV (CAST SUPPLIES) ×1 IMPLANT
PADDING CAST ABS 6INX4YD NS (CAST SUPPLIES) ×1
PADDING CAST ABS COTTON 6X4 NS (CAST SUPPLIES) ×1 IMPLANT
PADDING CAST COTTON 4X4 STRL (CAST SUPPLIES) ×1
PADDING CAST COTTON 6X4 STRL (CAST SUPPLIES) ×2 IMPLANT
PIN SCHANZ 4MM 130MM (PIN) ×8 IMPLANT
SET HNDPC FAN SPRY TIP SCT (DISPOSABLE) ×1 IMPLANT
SPONGE GAUZE 4X4 12PLY STER LF (GAUZE/BANDAGES/DRESSINGS) ×2 IMPLANT
STAPLER VISISTAT 35W (STAPLE) IMPLANT
SUCTION FRAZIER HANDLE 10FR (MISCELLANEOUS) ×1
SUCTION TUBE FRAZIER 10FR DISP (MISCELLANEOUS) ×1 IMPLANT
SUT ETHILON 3 0 PS 1 (SUTURE) ×2 IMPLANT
SUT VIC AB 1 CTX 27 (SUTURE) ×2 IMPLANT
SUT VIC AB 1 CTX 36 (SUTURE) ×2
SUT VIC AB 1 CTX36XBRD ANBCTR (SUTURE) ×2 IMPLANT
SUT VIC AB 2-0 CT1 27 (SUTURE) ×3
SUT VIC AB 2-0 CT1 TAPERPNT 27 (SUTURE) ×3 IMPLANT
SUT VIC AB 3-0 X1 27 (SUTURE) ×4 IMPLANT
SYR CONTROL 10ML LL (SYRINGE) ×2 IMPLANT
TOWEL OR 17X24 6PK STRL BLUE (TOWEL DISPOSABLE) ×2 IMPLANT
TOWEL OR 17X26 10 PK STRL BLUE (TOWEL DISPOSABLE) ×2 IMPLANT
WATER STERILE IRR 1000ML POUR (IV SOLUTION) IMPLANT

## 2015-12-11 NOTE — Op Note (Signed)
Sherri Murray, Sherri Murray            ACCOUNT NO.:  [id number]  MEDICAL RECORD NO.:  [id number]  LOCATION:  5N24C                        FACILITY:  [facility]  PHYSICIAN:  Kimley Apsey C. Lorin Mercy, M.D.    DATE OF BIRTH:  [birth year]  DATE OF PROCEDURE:  12/11/2015 DATE OF DISCHARGE:                              OPERATIVE REPORT   PREOPERATIVE DIAGNOSIS:  Right knee osteoarthritis.  POSTOPERATIVE DIAGNOSIS:  Right knee osteoarthritis.  PROCEDURE:  Right total knee arthroplasty computer assist.  SURGEON:  Blanca Thornton C. Lorin Mercy, M.D.  ASSISTANT:  Alyson Locket. Ricard Dillon, PA-C, medically necessary and present for the entire procedure.  ANESTHESIA:  General plus Marcaine local.  IMPLANTS:  LCS #3 femur, 2.5 tibia, 10 mm rotating platform, 35 mm poly.  DESCRIPTION OF PROCEDURE:  After induction of general anesthesia, orotracheal intubation, proximal thigh tourniquet, preoperative Ancef prophylaxis, standard prepping and draping with DuraPrep down to the ankle, usual extremity sheets, drapes, impervious stockinette, Coban, sterile skin marker, split sheets, and Betadine Steri-Drape were applied sealing the skin.  Time-out procedure was completed.  Incision was made where skin had been marked with a sterile skin marker.  A midline incision and medial parapatellar incision was made.  The patient had an old rent in the medial retinaculum where she had a previous injury from the gear stick poking through the medial retinaculum into the knee 25+ years ago.  This was extended and the quad tendon was split between the medial one-third and lateral two-thirds.  Patella was flipped over, there was grade 4 changes in the lateral compartment and patellofemoral compartment with almost complete loss of the articular cartilage on the patella.  A 10 mm of patella was removed from facet to facet with an oscillating saw.  Computer pins were placed inside the incision in the femur, stab incision in mid tibia and computer models were  generated.  A 9 mm taken off the tibia.  The patient had 2 degrees of valgus and 3 degrees of hyperextension.  Chamfer cuts were made.  Confirmation of all cuts showed that the femur was 1 mm shifted anteriorly and no varus or valgus.  A 9 mm taken off the tibia and keel preparation, then trials insertion that showed 19.5 mm flexion extension gap, medial lateral both in extension and flexion, full extension to 0 degrees, and good balance with a 10 mm rotating platform DePuy LCS trials.  Trials were removed, pulse lavaged, drilling of the femoral lug nut holes.  Tibia was cemented first followed by femur, placement of the poly and then the patella.  Cement was hard at 15 minutes.  Tourniquet time was 49 minutes.  Operative field was dried.  Cautery was used for bleeders after tourniquet was deflated. Standard closure #1 Vicryl in the retinaculum, 2-0 Vicryl subcutaneous tissue subcuticular closure, tincture of benzoin, Steri-Strips, 4x4s, ABD, Webril, Ace wrap, and knee immobilizer.  Instrument count and needle count was correct.  The patient tolerated the procedure well.     Lilton Pare C. Lorin Mercy, M.D.     MCY/MEDQ  D:  12/11/2015  T:  12/11/2015  Job:  [id number]

## 2015-12-11 NOTE — Progress Notes (Signed)
Orthopedic Tech Progress Note Patient Details:  Sherri Murray [birth year] [id number]  Ortho Devices Ortho Device/Splint Location: applied ohf   Braulio Bosch 12/11/2015, 4:16 PM

## 2015-12-11 NOTE — Anesthesia Postprocedure Evaluation (Signed)
Anesthesia Post Note  Patient: Sherri Murray  Procedure(s) Performed: Procedure(s) (LRB): COMPUTER ASSISTED TOTAL KNEE ARTHROPLASTY (Right)  Patient location during evaluation: PACU Anesthesia Type: General Level of consciousness: awake and alert Pain management: pain level controlled Vital Signs Assessment: post-procedure vital signs reviewed and stable Respiratory status: spontaneous breathing, nonlabored ventilation, respiratory function stable and patient connected to nasal cannula oxygen Cardiovascular status: blood pressure returned to baseline and stable Postop Assessment: no signs of nausea or vomiting Anesthetic complications: no    Last Vitals:  Filed Vitals:   12/11/15 1730 12/11/15 1740  BP: 105/47 97/44  Pulse: 57 62  Temp: 36.9 C 36.5 C  Resp: 21 16    Last Pain:  Filed Vitals:   12/11/15 1820  PainSc: 5                  Zenaida Deed

## 2015-12-11 NOTE — Progress Notes (Signed)
Orthopedic Tech Progress Note Patient Details:  Sherri Murray 06/04/1941 [id number]  Patient ID: Sherri Murray, female   DOB: 1941-03-16, 75 y.o.   MRN: [id number] Pt. Still in cpm from surgery  Karolee Stamps 12/11/2015, 7:10 PM

## 2015-12-11 NOTE — Progress Notes (Signed)
Utilization review completed.  

## 2015-12-11 NOTE — Brief Op Note (Signed)
12/11/2015  2:53 PM  PATIENT:  Sherri Murray  75 y.o. female  PRE-OPERATIVE DIAGNOSIS:  Right Knee Degenerative Joint Disease  POST-OPERATIVE DIAGNOSIS:  Right Knee Degenerative Joint Disease  PROCEDURE:  Procedure(s): COMPUTER ASSISTED TOTAL KNEE ARTHROPLASTY (Right)  SURGEON:  Surgeon(s) and Role:    Marybelle Killings, MD - Primary  PHYSICIAN ASSISTANT: Florita Nitsch m. Ricard Dillon pa-c    ANESTHESIA:   general  EBL:  Total I/O In: 1000 [I.V.:1000] Out: -   BLOOD ADMINISTERED:none  DRAINS: none   LOCAL MEDICATIONS USED:  NONE  SPECIMEN:  No Specimen  DISPOSITION OF SPECIMEN:  N/A  COUNTS:  YES  TOURNIQUET:   Total Tourniquet Time Documented: area (laterality) - 48 minutes Total: area (laterality) - 48 minutes   DICTATION: .Dragon Dictation  PLAN OF CARE: Admit to inpatient   PATIENT DISPOSITION:  PACU - hemodynamically stable.

## 2015-12-11 NOTE — Progress Notes (Signed)
Orthopedic Tech Progress Note Patient Details:  Sherri Murray Dec 22, 1940 [id number]  CPM Right Knee CPM Right Knee: On Right Knee Flexion (Degrees): 90 Right Knee Extension (Degrees): 0   Braulio Bosch 12/11/2015, 4:17 PM

## 2015-12-11 NOTE — Anesthesia Preprocedure Evaluation (Signed)
Anesthesia Evaluation  Patient identified by MRN, date of birth, ID band Patient awake    Reviewed: Allergy & Precautions, H&P , NPO status , Patient's Chart, lab work & pertinent test results  History of Anesthesia Complications Negative for: history of anesthetic complications  Airway Mallampati: II  TM Distance: >3 FB Neck ROM: full    Dental no notable dental hx.    Pulmonary asthma ,    Pulmonary exam normal breath sounds clear to auscultation       Cardiovascular hypertension, + Peripheral Vascular Disease  negative cardio ROS Normal cardiovascular exam Rhythm:regular Rate:Normal     Neuro/Psych negative neurological ROS     GI/Hepatic negative GI ROS, Neg liver ROS,   Endo/Other  negative endocrine ROSHypothyroidism   Renal/GU negative Renal ROS     Musculoskeletal  (+) Arthritis , Fibromyalgia -  Abdominal   Peds  Hematology negative hematology ROS (+)   Anesthesia Other Findings   Reproductive/Obstetrics negative OB ROS                             Anesthesia Physical Anesthesia Plan  ASA: II  Anesthesia Plan: General   Post-op Pain Management:    Induction: Intravenous  Airway Management Planned: Oral ETT  Additional Equipment: None  Intra-op Plan:   Post-operative Plan: Extubation in OR  Informed Consent: I have reviewed the patients History and Physical, chart, labs and discussed the procedure including the risks, benefits and alternatives for the proposed anesthesia with the patient or authorized representative who has indicated his/her understanding and acceptance.   Dental Advisory Given  Plan Discussed with: Anesthesiologist, CRNA and Surgeon  Anesthesia Plan Comments:         Anesthesia Quick Evaluation

## 2015-12-11 NOTE — Anesthesia Procedure Notes (Signed)
Procedure Name: Intubation Date/Time: 12/11/2015 12:58 PM Performed by: Scheryl Darter Pre-anesthesia Checklist: Patient identified, Emergency Drugs available, Suction available, Patient being monitored and Timeout performed Patient Re-evaluated:Patient Re-evaluated prior to inductionOxygen Delivery Method: Circle system utilized Preoxygenation: Pre-oxygenation with 100% oxygen Intubation Type: IV induction Ventilation: Mask ventilation without difficulty Laryngoscope Size: Miller and 2 Grade View: Grade I Tube type: Oral Tube size: 7.5 mm Number of attempts: 1 Airway Equipment and Method: Stylet Placement Confirmation: ETT inserted through vocal cords under direct vision and breath sounds checked- equal and bilateral Secured at: 22 cm Tube secured with: Tape Dental Injury: Teeth and Oropharynx as per pre-operative assessment
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
## 2015-12-11 NOTE — Transfer of Care (Signed)
Immediate Anesthesia Transfer of Care Note  Patient: Sherri Murray  Procedure(s) Performed: Procedure(s): COMPUTER ASSISTED TOTAL KNEE ARTHROPLASTY (Right)  Patient Location: PACU  Anesthesia Type:General  Level of Consciousness: awake, alert , oriented and sedated  Airway & Oxygen Therapy: Patient Spontanous Breathing and Patient connected to face mask oxygen  Post-op Assessment: Report given to RN, Post -op Vital signs reviewed and stable and Patient moving all extremities  Post vital signs: Reviewed and stable  Last Vitals:  Filed Vitals:   12/11/15 1150 12/11/15 1155  BP: 155/49 167/65  Pulse: 48 55  Temp:    Resp: 7 15    Complications: No apparent anesthesia complications

## 2015-12-12 ENCOUNTER — Encounter (HOSPITAL_COMMUNITY): Payer: Self-pay | Admitting: Orthopaedic Surgery

## 2015-12-12 LAB — BASIC METABOLIC PANEL
Anion gap: 9 (ref 5–15)
BUN: 8 mg/dL (ref 4–21)
BUN: 8 mg/dL (ref 6–20)
CALCIUM: 8.7 mg/dL — AB (ref 8.9–10.3)
CHLORIDE: 100 mmol/L — AB (ref 101–111)
CO2: 23 mmol/L (ref 22–32)
CREATININE: 0.8 mg/dL (ref <=1.1)
CREATININE: 0.75 mg/dL (ref 0.44–1.00)
GFR calc Af Amer: >60 mL/min (ref >60)
GFR calc non Af Amer: >60 mL/min (ref >60)
Glucose, Bld: 119 mg/dL — ABNORMAL HIGH (ref 65–99)
Glucose: 119 mg/dL
Potassium: 4.3 mmol/L (ref 3.5–5.1)
SODIUM: 132 mmol/L — AB (ref 135–145)

## 2015-12-12 LAB — CBC
HCT: 26.1 % — ABNORMAL LOW (ref 36.0–46.0)
Hemoglobin: 8.9 g/dL — ABNORMAL LOW (ref 12.0–15.0)
MCH: 31.1 pg (ref 26.0–34.0)
MCHC: 34.1 g/dL (ref 30.0–36.0)
MCV: 91.3 fL (ref 78.0–100.0)
PLATELETS: 180 10*3/uL (ref 150–400)
RBC: 2.86 MIL/uL — ABNORMAL LOW (ref 3.87–5.11)
RDW: 13 % (ref 11.5–15.5)
WBC: 8 10*3/uL (ref 4.0–10.5)

## 2015-12-12 LAB — CBC AND DIFFERENTIAL: WBC: 8 10*3/mL

## 2015-12-12 MED ORDER — LOSARTAN POTASSIUM 50 MG PO TABS
50.0000 mg | ORAL_TABLET | Freq: Every day | ORAL | Status: DC
  Administered 2015-12-12 – 2015-12-13 (×2): 50 mg via ORAL
  Filled 2015-12-12 (×2): qty 1

## 2015-12-12 MED ORDER — HYDROCHLOROTHIAZIDE 12.5 MG PO CAPS
12.5000 mg | ORAL_CAPSULE | Freq: Every day | ORAL | Status: DC
  Administered 2015-12-12 – 2015-12-13 (×2): 12.5 mg via ORAL
  Filled 2015-12-12 (×2): qty 1

## 2015-12-12 MED ORDER — KETOROLAC TROMETHAMINE 30 MG/ML IJ SOLN
30.0000 mg | Freq: Four times a day (QID) | INTRAMUSCULAR | Status: DC | PRN
Start: 2015-12-12 — End: 2015-12-13
  Administered 2015-12-12 (×3): 30 mg via INTRAVENOUS
  Filled 2015-12-12 (×3): qty 1

## 2015-12-12 NOTE — Progress Notes (Signed)
Physical Therapy Treatment Patient Details Name: Sherri Murray MRN: [id number] DOB: 1940/12/11 Today's Date: 12/12/2015    History of Present Illness   74 yo s/p R TKA    PT Comments    Patient is making progress towards PT goals, but does express concerns [id number] management. At this time patient is still requiring increased cues for safety with mobility. Anticipate patient will need AT LEAST 2 more therapy sessions prior to d/c home to ensure safety with mobility.    Follow Up Recommendations  Home health PT;Supervision/Assistance - 24 hour     Equipment Recommendations  Rolling walker with 5" wheels;3in1 (PT)    Recommendations for Other Services       Precautions / Restrictions Precautions Precautions: Knee;Fall Precaution Comments: verbally educated on precautions, positioning, and activity expectations Restrictions Weight Bearing Restrictions: Yes RLE Weight Bearing: Weight bearing as tolerated    Mobility  Bed Mobility Overal bed mobility: Needs Assistance Bed Mobility: Supine to Sit;Sit to Supine     Supine to sit: Min guard     General bed mobility comments: No physical assist required, patient required minimal cues, min guard for safety  Transfers Overall transfer level: Needs assistance Equipment used: Rolling walker (2 wheeled) Transfers: Sit to/from Stand Sit to Stand: Min assist         General transfer comment: VCs for hand placement with modification of one hand on RW  Ambulation/Gait Ambulation/Gait assistance: Min guard;Min assist Ambulation Distance (Feet): 180 Feet Assistive device: Rolling walker (2 wheeled) Gait Pattern/deviations: Step-to pattern;Shuffle     General Gait Details: Patient initially step to cadence, improved to step through with cues. At times patient with increase speed and poor safety awarenes. Cued to slow and control gait. 4 standing rest breaks   Stairs            Wheelchair Mobility    Modified  Rankin (Stroke Patients Only)       Balance                                    Cognition Arousal/Alertness: Awake/alert Behavior During Therapy: WFL for tasks assessed/performed Overall Cognitive Status: Within Functional Limits for tasks assessed                      Exercises      General Comments General comments (skin integrity, edema, etc.): educated on safety with mobility, repositioning. Removed CPM and encouraged TKA therapeutic exercise, knee flexion, LAQs and kne extension/quad sets in chair.       Pertinent Vitals/Pain Pain Assessment: 0-10 Pain Score: 6  Pain Descriptors / Indicators: Aching;Grimacing;Guarding;Sharp Pain Intervention(s): Monitored during session;Repositioned;Relaxation    Home Living                      Prior Function            PT Goals (current goals can now be found in the care plan section) Acute Rehab PT Goals Patient Stated Goal: to be able to move around without pain PT Goal Formulation: With patient Time For Goal Achievement: 12/26/15 Potential to Achieve Goals: Good Progress towards PT goals: Progressing toward goals    Frequency  7X/week    PT Plan Current plan remains appropriate    Co-evaluation             End of Session Equipment Utilized During Treatment: Gait belt Activity  Tolerance: Patient tolerated treatment well Patient left: in chair;with call bell/phone within reach;with chair alarm set     Time: [id number] PT Time Calculation (min) (ACUTE ONLY): 24 min  Charges:  $Gait Training: 8-22 mins $Therapeutic Activity: 8-22 mins                    G CodesDuncan Dull 01-05-2016, 5:58 PM Alben Deeds, [place] DPT  [phone]

## 2015-12-12 NOTE — Progress Notes (Signed)
Orthopedic Tech Progress Note Patient Details:  Sherri Murray 1940-10-25 [id number]  CPM Right Knee CPM Right Knee: On Right Knee Flexion (Degrees): 90 Right Knee Extension (Degrees): 0 Additional Comments:  (trapeze)   [place] Pink 12/12/2015, 2:41 PM

## 2015-12-12 NOTE — Clinical Social Work Note (Signed)
CSW received referral for SNF.  Case discussed with case manager, and plan is to discharge home.  CSW to sign off please re-consult if social work needs arise.  Sherri Murray R. Sherri Murray, MSW, LCSWA [phone]  

## 2015-12-12 NOTE — Evaluation (Signed)
Physical Therapy Evaluation Patient Details Name: Sherri Murray MRN: [id number] DOB: 1941/09/19 Today's Date: 12/12/2015   History of Present Illness  75 yo s/p R TKA  Clinical Impression  Patient demonstrates deficits in functional mobility as indicated below. Will need continued skilled PT to address deficits and maximize function. Will see as indicated and progress as tolerated.  OF NOTE: patient was able to perform transfers with assist and tolerated EOB activity well despite feeling nauseated earlier in the day. Did not stay OOB as there was no recliner available on the unit for the patient. Nsg notified. Will see this afternoon for follow up session.    Follow Up Recommendations Home health PT;Supervision/Assistance - 24 hour    Equipment Recommendations  Rolling walker with 5" wheels;3in1 (PT)    Recommendations for Other Services       Precautions / Restrictions Precautions Precautions: Knee;Fall Precaution Comments: verbally educated on precautions, positioning, and activity expectations Restrictions Weight Bearing Restrictions: Yes RLE Weight Bearing: Weight bearing as tolerated      Mobility  Bed Mobility Overal bed mobility: Needs Assistance Bed Mobility: Supine to Sit;Sit to Supine     Supine to sit: Min assist Sit to supine: Min assist   General bed mobility comments: VCs for positioning and sequencing. Cues for self assist, min assist to elevate trunk to upright.   Transfers Overall transfer level: Needs assistance Equipment used: Rolling walker (2 wheeled) Transfers: Sit to/from [facility] Sit to Stand: Mod assist Stand pivot transfers: Min assist       General transfer comment: VCs for hand placement, most assist required for power up to RW  Ambulation/Gait Ambulation/Gait assistance: Min assist Ambulation Distance (Feet): 10 Feet Assistive device: Rolling walker (2 wheeled) Gait Pattern/deviations: Step-to pattern;Shuffle         Stairs            Wheelchair Mobility    Modified Rankin (Stroke Patients Only)       Balance Overall balance assessment: History of Falls                                           Pertinent Vitals/Pain Pain Assessment: 0-10 Pain Score: 8  Pain Descriptors / Indicators: Aching;Grimacing;Guarding;Sharp Pain Intervention(s): Limited activity within patient's tolerance;Monitored during session;Premedicated before session;Repositioned;Relaxation    Home Living Family/patient expects to be discharged to:: Private residence Living Arrangements: Spouse/significant other Available Help at Discharge: Family (B/f is in w/c.) Type of Home: House Home Access: Stairs to enter Entrance Stairs-Rails: Can reach both;Right;Left Entrance Stairs-Number of Steps: 1-5, depending on how she goes inside Home Layout: One level Home Equipment: Wheelchair - manual;Cane - single point;Walker - 2 wheels      Prior Function Level of Independence: Independent               Hand Dominance   Dominant Hand: Right    Extremity/Trunk Assessment   Upper Extremity Assessment: Overall WFL for tasks assessed           Lower Extremity Assessment: Generalized weakness;RLE deficits/detail         Communication   Communication: No difficulties  Cognition Arousal/Alertness: Awake/alert Behavior During Therapy: WFL for tasks assessed/performed Overall Cognitive Status: Within Functional Limits for tasks assessed                      General Comments  Exercises        Assessment/Plan    PT Assessment Patient needs continued PT services  PT Diagnosis Difficulty walking;Abnormality of gait;Generalized weakness;Acute pain   PT Problem List Decreased strength;Decreased activity tolerance;Decreased balance;Decreased mobility;Decreased safety awareness;Pain  PT Treatment Interventions DME instruction;Gait training;Stair training;Functional  mobility training;Therapeutic activities;Therapeutic exercise;Balance training;Patient/family education   PT Goals (Current goals can be found in the Care Plan section) Acute Rehab PT Goals Patient Stated Goal: to be able to move around without pain PT Goal Formulation: With patient Time For Goal Achievement: 12/26/15 Potential to Achieve Goals: Good    Frequency 7X/week   Barriers to discharge        Co-evaluation               End of Session Equipment Utilized During Treatment: Gait belt Activity Tolerance: Patient tolerated treatment well Patient left: in bed;with call bell/phone within reach;with bed alarm set Nurse Communication: Mobility status         Time: 1030-1051 PT Time Calculation (min) (ACUTE ONLY): 21 min   Charges:   PT Evaluation $PT Eval Moderate Complexity: 1 Procedure     PT G CodesDuncan Dull 08-Jan-2016, 1:16 PM Alben Deeds, [place] DPT  [phone]

## 2015-12-12 NOTE — Progress Notes (Signed)
Orthopedic Tech Progress Note Patient Details:  Sherri Murray 12/24/1940 [id number] Ortho vis on cpm at 1850ti Patient ID: Sherri Murray, female   DOB: 24-Oct-1940, 75 y.o.   MRN: [id number]   Braulio Bosch 12/12/2015, 6:47 PM

## 2015-12-12 NOTE — Progress Notes (Signed)
Subjective: 1 Day Post-Op Procedure(s) (LRB): COMPUTER ASSISTED TOTAL KNEE ARTHROPLASTY (Right) Patient reports pain as moderate.    Objective: Vital signs in last 24 hours: Temp:  [97 F (36.1 C)-98.5 F (36.9 C)] 98.5 F (36.9 C) (03/02 0515) Pulse Rate:  [48-87] 64 (03/02 0515) Resp:  [7-21] 16 (03/02 0515) BP: (70-167)/(43-78) 100/44 mmHg (03/02 0515) SpO2:  [92 %-100 %] 99 % (03/02 0515) Weight:  [72.122 kg (159 lb)] 72.122 kg (159 lb) (03/01 1039)  Intake/Output from previous day: 03/01 0701 - 03/02 0700 In: 2435.4 [I.V.:2385.4; IV Piggyback:50] Out: 1000 [Urine:900; Blood:100] Intake/Output this shift: Total I/O In: 850.4 [I.V.:800.4; IV Piggyback:50] Out: 900 [Urine:900]  No results for input(s): HGB in the last 72 hours. No results for input(s): WBC, RBC, HCT, PLT in the last 72 hours.  Recent Labs  12/12/15 0400  NA 132*  K 4.3  CL 100*  CO2 23  BUN 8  CREATININE 0.75  GLUCOSE 119*  CALCIUM 8.7*   No results for input(s): LABPT, INR in the last 72 hours.  Neurologically intact  Assessment/Plan: 1 Day Post-Op Procedure(s) (LRB): COMPUTER ASSISTED TOTAL KNEE ARTHROPLASTY (Right) Up with therapy  Unable to void, cath times 1.   Sherri Murray C 12/12/2015, 6:56 AM

## 2015-12-12 NOTE — Progress Notes (Signed)
Orthopedic Tech Progress Note Patient Details:  Sherri Murray 01-09-1941 [id number]  Patient ID: [name], female   DOB: 1940/10/23, 75 y.o.   MRN: [id number] Pt. Refused cpm. In to much pain  Karolee Stamps 12/12/2015, 5:52 AM

## 2015-12-13 DIAGNOSIS — M25569 Pain in unspecified knee: Secondary | ICD-10-CM | POA: Diagnosis not present

## 2015-12-13 DIAGNOSIS — K589 Irritable bowel syndrome without diarrhea: Secondary | ICD-10-CM | POA: Diagnosis not present

## 2015-12-13 DIAGNOSIS — Z96651 Presence of right artificial knee joint: Secondary | ICD-10-CM | POA: Diagnosis not present

## 2015-12-13 DIAGNOSIS — R2689 Other abnormalities of gait and mobility: Secondary | ICD-10-CM | POA: Diagnosis not present

## 2015-12-13 DIAGNOSIS — S72051A Unspecified fracture of head of right femur, initial encounter for closed fracture: Secondary | ICD-10-CM | POA: Diagnosis not present

## 2015-12-13 DIAGNOSIS — M6281 Muscle weakness (generalized): Secondary | ICD-10-CM | POA: Diagnosis not present

## 2015-12-13 DIAGNOSIS — E781 Pure hyperglyceridemia: Secondary | ICD-10-CM | POA: Diagnosis not present

## 2015-12-13 DIAGNOSIS — I10 Essential (primary) hypertension: Secondary | ICD-10-CM | POA: Diagnosis not present

## 2015-12-13 DIAGNOSIS — E785 Hyperlipidemia, unspecified: Secondary | ICD-10-CM | POA: Diagnosis not present

## 2015-12-13 DIAGNOSIS — Z471 Aftercare following joint replacement surgery: Secondary | ICD-10-CM | POA: Diagnosis not present

## 2015-12-13 DIAGNOSIS — D500 Iron deficiency anemia secondary to blood loss (chronic): Secondary | ICD-10-CM | POA: Diagnosis not present

## 2015-12-13 DIAGNOSIS — M1711 Unilateral primary osteoarthritis, right knee: Secondary | ICD-10-CM | POA: Diagnosis not present

## 2015-12-13 DIAGNOSIS — D62 Acute posthemorrhagic anemia: Secondary | ICD-10-CM | POA: Diagnosis not present

## 2015-12-13 DIAGNOSIS — K219 Gastro-esophageal reflux disease without esophagitis: Secondary | ICD-10-CM | POA: Diagnosis not present

## 2015-12-13 LAB — CBC
HEMATOCRIT: 24.8 % — AB (ref 36.0–46.0)
HEMOGLOBIN: 8.5 g/dL — AB (ref 12.0–15.0)
MCH: 30.6 pg (ref 26.0–34.0)
MCHC: 34.3 g/dL (ref 30.0–36.0)
MCV: 89.2 fL (ref 78.0–100.0)
Platelets: 178 10*3/uL (ref 150–400)
RBC: 2.78 MIL/uL — ABNORMAL LOW (ref 3.87–5.11)
RDW: 12.8 % (ref 11.5–15.5)
WBC: 7.5 10*3/uL (ref 4.0–10.5)

## 2015-12-13 MED ORDER — POLYETHYLENE GLYCOL 3350 17 G PO PACK: 17.0000 g | PACK | Freq: Every day | ORAL | Status: DC | PRN

## 2015-12-13 MED ORDER — METHOCARBAMOL 500 MG PO TABS: 500.0000 mg | ORAL_TABLET | Freq: Four times a day (QID) | ORAL | Status: DC | PRN

## 2015-12-13 MED ORDER — OXYCODONE-ACETAMINOPHEN 5-325 MG PO TABS: 1.0000 | ORAL_TABLET | Freq: Four times a day (QID) | ORAL | Status: DC | PRN

## 2015-12-13 NOTE — Progress Notes (Signed)
Orthopedic Tech Progress Note Patient Details:  Sherri Murray 1941-03-07 [id number]  Patient ID: Sherri Murray, female   DOB: 02/27/1941, 75 y.o.   MRN: [id number] Pt. refused cpm. Will call when ready.  Karolee Stamps 12/13/2015, 6:15 AM

## 2015-12-13 NOTE — Discharge Instructions (Signed)
INSTRUCTIONS AFTER TOTAL KNEE JOINT REPLACEMENT  ° °o Remove items at home which could result in a fall. This includes throw rugs or furniture in walking pathways °o ICE to the affected joint every three hours while awake for 30 minutes at a time, for at least the first 3-5 days, and then as needed for pain and swelling.  Continue to use ice for pain and swelling. You may notice swelling that will progress down to the foot and ankle.  This is normal after surgery.  Elevate your leg when you are not up walking on it.   °o Continue to use the breathing machine you got in the hospital (incentive spirometer) which will help keep your temperature down.  It is common for your temperature to cycle up and down following surgery, especially at night when you are not up moving around and exerting yourself.  The breathing machine keeps your lungs expanded and your temperature down. ° ° °DIET:  As you were doing prior to hospitalization, we recommend a well-balanced diet. ° °DRESSING / WOUND CARE / SHOWERING ° °You may change your dressing 3-5 days after surgery.  Then change the dressing every day with sterile gauze.  Please use good hand washing techniques before changing the dressing.  Do not use any lotions or creams on the incision until instructed by your surgeon. ° °ACTIVITY ° °o Increase activity slowly as tolerated, but follow the weight bearing instructions below.   °o No driving for 6 weeks or until further direction given by your physician.  You cannot drive while taking narcotics.  °o No lifting or carrying greater than 10 lbs. until further directed by your surgeon. °o Avoid periods of inactivity such as sitting longer than an hour when not asleep. This helps prevent blood clots.  °o You may return to work once you are authorized by your doctor.  ° ° ° °WEIGHT BEARING  ° °Weight bearing as tolerated with assist device (walker, cane, etc) as directed, use it as long as suggested by your surgeon or therapist,  typically at least 4-6 weeks. ° ° °EXERCISES ° °Results after joint replacement surgery are often greatly improved when you follow the exercise, range of motion and muscle strengthening exercises prescribed by your doctor. Safety measures are also important to protect the joint from further injury. Any time any of these exercises cause you to have increased pain or swelling, decrease what you are doing until you are comfortable again and then slowly increase them. If you have problems or questions, call your caregiver or physical therapist for advice.  ° °Rehabilitation is important following a joint replacement. After just a few days of immobilization, the muscles of the leg can become weakened and shrink (atrophy).  These exercises are designed to build up the tone and strength of the thigh and leg muscles and to improve motion. Often times heat used for twenty to thirty minutes before working out will loosen up your tissues and help with improving the range of motion but do not use heat for the first two weeks following surgery (sometimes heat can increase post-operative swelling).  ° °These exercises can be done on a training (exercise) mat, on the floor, on a table or on a bed. Use whatever works the best and is most comfortable for you.    Use music or television while you are exercising so that the exercises are a pleasant break in your day. This will make your life better with the exercises acting as   a break in your routine that you can look forward to.   Perform all exercises about fifteen times, three times per day or as directed.  You should exercise both the operative leg and the other leg as well.  Exercises include:    Quad Sets - Tighten up the muscle on the front of the thigh (Quad) and hold for 5-10 seconds.    Straight Leg Raises - With your knee straight (if you were given a brace, keep it on), lift the leg to 60 degrees, hold for 3 seconds, and slowly lower the leg.  Perform this exercise  against resistance later as your leg gets stronger.   Leg Slides: Lying on your back, slowly slide your foot toward your buttocks, bending your knee up off the floor (only go as far as is comfortable). Then slowly slide your foot back down until your leg is flat on the floor again.   Angel Wings: Lying on your back spread your legs to the side as far apart as you can without causing discomfort.   Hamstring Strength:  Lying on your back, push your heel against the floor with your leg straight by tightening up the muscles of your buttocks.  Repeat, but this time bend your knee to a comfortable angle, and push your heel against the floor.  You may put a pillow under the heel to make it more comfortable if necessary.   A rehabilitation program following joint replacement surgery can speed recovery and prevent re-injury in the future due to weakened muscles. Contact your doctor or a physical therapist for more information on knee rehabilitation.    CONSTIPATION  Constipation is defined medically as fewer than three stools per week and severe constipation as less than one stool per week.  Even if you have a regular bowel pattern at home, your normal regimen is likely to be disrupted due to multiple reasons following surgery.  Combination of anesthesia, postoperative narcotics, change in appetite and fluid intake all can affect your bowels.   YOU MUST use at least one of the following options; they are listed in order of increasing strength to get the job done.  They are all available over the counter, and you may need to use some, POSSIBLY even all of these options:    Drink plenty of fluids (prune juice may be helpful) and high fiber foods Colace 100 mg by mouth twice a day  Senokot for constipation as directed and as needed Dulcolax (bisacodyl), take with full glass of water  Miralax (polyethylene glycol) once or twice a day as needed.  If you have tried all these things and are unable to have a  bowel movement in the first 3-4 days after surgery call either your surgeon or your primary doctor.    If you experience loose stools or diarrhea, hold the medications until you stool forms back up.  If your symptoms do not get better within 1 week or if they get worse, check with your doctor.  If you experience "the worst abdominal pain ever" or develop nausea or vomiting, please contact the office immediately for further recommendations for treatment.   ITCHING:  If you experience itching with your medications, try taking only a single pain pill, or even half a pain pill at a time.  You can also use Benadryl over the counter for itching or also to help with sleep.   TED HOSE STOCKINGS:  Use stockings on both legs until for at least 2 weeks  or as directed by physician office. They may be removed at night for sleeping. ° °MEDICATIONS:  See your medication summary on the “After Visit Summary” that nursing will review with you.  You may have some home medications which will be placed on hold until you complete the course of blood thinner medication.  It is important for you to complete the blood thinner medication as prescribed. ° °PRECAUTIONS:  If you experience chest pain or shortness of breath - call 911 immediately for transfer to the hospital emergency department.  ° °If you develop a fever greater that 101 F, purulent drainage from wound, increased redness or drainage from wound, foul odor from the wound/dressing, or calf pain - CONTACT YOUR SURGEON.   °                                                °FOLLOW-UP APPOINTMENTS:  If you do not already have a post-op appointment, please call the office for an appointment to be seen by your surgeon.  Guidelines for how soon to be seen are listed in your “After Visit Summary”, but are typically between 1-4 weeks after surgery. ° °OTHER INSTRUCTIONS:  ° °Knee Replacement:  Do not place pillow under knee, focus on keeping the knee straight while resting. CPM  instructions: 0-90 degrees, 2 hours in the morning, 2 hours in the afternoon, and 2 hours in the evening. Place foam block, curve side up under heel at all times except when in CPM or when walking.  DO NOT modify, tear, cut, or change the foam block in any way. ° °MAKE SURE YOU:  °• Understand these instructions.  °• Get help right away if you are not doing well or get worse.  ° ° °Thank you for letting us be a part of your medical care team.  It is a privilege we respect greatly.  We hope these instructions will help you stay on track for a fast and full recovery!  ° °

## 2015-12-13 NOTE — Progress Notes (Signed)
Physical Therapy Treatment Patient Details Name: Sherri Murray MRN: [id number] DOB: [birth year] Today's Date: 12/13/2015    History of Present Illness 75 yo s/p R TKA    PT Comments    Patient seen for progression of mobility and education. At this time, patient having difficulty progression towards goals, requiring increased assist for all aspects of mobility. At this time, do not feel patient has progressed to a point where she would be safe for d/c home. Spoke with patient at length regarding concerns for mobility as well as increased level of physical assist needed. Patient in agreement that ST SNF would be beneficial to assist patient with progression and activity tolerance in order to ensure safety and decreased risk for falls. MD notified.   Follow Up Recommendations  SNF;Supervision/Assistance - 24 hour     Equipment Recommendations  Rolling walker with 5" wheels    Recommendations for Other Services       Precautions / Restrictions Precautions Precautions: Knee;Fall Restrictions Weight Bearing Restrictions: Yes RLE Weight Bearing: Weight bearing as tolerated    Mobility  Bed Mobility Overal bed mobility: Needs Assistance Bed Mobility: Supine to Sit;Sit to Supine     Supine to sit: Min assist     General bed mobility comments: received in chair  Transfers Overall transfer level: Needs assistance Equipment used: Rolling walker (2 wheeled)   Sit to Stand: Mod assist Stand pivot transfers: Mod assist       General transfer comment: Patient have significantly more difficulty with mobility this session, increased assist required to power up with RW, patient could not progress to upright without assist. Increased cues and assist for pivot to bedside commode.   Ambulation/Gait Ambulation/Gait assistance: Mod assist Ambulation Distance (Feet): 18 Feet Assistive device: Rolling walker (2 wheeled) Gait Pattern/deviations: Step-to pattern;Shuffle Gait velocity:  decreased Gait velocity interpretation: Below normal speed for age/gender General Gait Details: patient with poor ability to follow sequencing this session despite repeated verabl and tactile cues. Increased physical assist with patient as she was easily fatigued and unable to maintain support despite use of Rw.   Stairs            Wheelchair Mobility    Modified Rankin (Stroke Patients Only)       Balance Overall balance assessment: History of Falls                                  Cognition Arousal/Alertness: Awake/alert Behavior During Therapy: Impulsive Overall Cognitive Status: Impaired/Different from baseline Area of Impairment: Problem solving;Safety/judgement;Following commands       Following Commands: Follows one step commands with increased time Safety/Judgement: Decreased awareness of safety   Problem Solving: Slow processing;Difficulty sequencing;Requires verbal cues;Requires tactile cues General Comments: patient had difficulty following sequencing for ambulation despite maximial verbal and tactile cues. Patient with poor ability to remain safe during mobility    Exercises      General Comments General comments (skin integrity, edema, etc.): re-educated on safety with mobility, importance of use of CPM and performance of OOB activity as well as HEP.       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 He is in wheelchair. ) Type of Home: House Home Access: Stairs to  enter Entrance Stairs-Rails: Can reach both;Right;Left Home Layout: One level Home Equipment: Wheelchair - manual;Cane - single point;Walker - 2 wheels;Bedside commode      Prior Function Level of Independence: Independent          PT Goals (current goals can now be found in the care plan section) Acute Rehab PT Goals Patient Stated Goal: to move better and have less pain PT Goal Formulation: With patient Time For Goal Achievement: 12/26/15 Potential to Achieve Goals: Good Progress towards PT goals: Not progressing toward goals - comment (pt had difficulty with progression this sesion)    Frequency  7X/week    PT Plan Discharge plan needs to be updated    Co-evaluation             End of Session Equipment Utilized During Treatment: Gait belt Activity Tolerance: Patient tolerated treatment well Patient left: in chair;with call bell/phone within reach;with chair alarm set     Time: [id number] PT Time Calculation (min) (ACUTE ONLY): 27 min  Charges:  $Gait Training: 8-22 mins $Therapeutic Activity: 8-22 mins                    G CodesDuncan Dull 01/08/16, 9:41 AM Alben Deeds, PT DPT  [phone]
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
## 2015-12-13 NOTE — Discharge Planning (Signed)
Report called to Wade at [facility]. 

## 2015-12-13 NOTE — Care Management Important Message (Signed)
Important Message  Patient Details  Name: Sherri Murray MRN: [id number] Date of Birth: September 09, 1941   Medicare Important Message Given:  Yes    Ilze Roselli P [place] 12/13/2015, 4:15 PM

## 2015-12-13 NOTE — Progress Notes (Signed)
Subjective: 2 Days Post-Op Procedure(s) (LRB): COMPUTER ASSISTED TOTAL KNEE ARTHROPLASTY (Right) Patient reports pain as moderate.    Objective: Vital signs in last 24 hours: Temp:  [98.2 F (36.8 C)-99.7 F (37.6 C)] 99.7 F (37.6 C) (03/03 0500) Pulse Rate:  [78-92] 92 (03/03 0500) Resp:  [16-18] 18 (03/03 0500) BP: (87-110)/(53-65) 110/53 mmHg (03/03 0500) SpO2:  [90 %-100 %] 90 % (03/03 0500)  Intake/Output from previous day: 03/02 0701 - 03/03 0700 In: 820 [P.O.:820] Out: 450 [Urine:450] Intake/Output this shift:     Recent Labs  12/12/15 0611 12/13/15 0635  HGB 8.9* 8.5*    Recent Labs  12/12/15 0611 12/13/15 0635  WBC 8.0 7.5  RBC 2.86* 2.78*  HCT 26.1* 24.8*  PLT 180 178    Recent Labs  12/12/15 0400  NA 132*  K 4.3  CL 100*  CO2 23  BUN 8  CREATININE 0.75  GLUCOSE 119*  CALCIUM 8.7*   No results for input(s): LABPT, INR in the last 72 hours.  Neurologically intact  Assessment/Plan: 2 Days Post-Op Procedure(s) (LRB): COMPUTER ASSISTED TOTAL KNEE ARTHROPLASTY (Right) Up with therapy will need SNF.  Walked around the hall yesterday and today could only make it to the door and needs assistance. Fall risk if she went home.   Bela Bonaparte C 12/13/2015, 9:37 AM

## 2015-12-13 NOTE — Clinical Social Work Note (Signed)
Patient disposition changed to SNF.  CSW assessed patient at bedside.  Patient will was informed of facilities offering a bed for today.  Patient has chosen [facility].  Helene Kelp has confirmed bed availability and wishes for CSW to call for transport at 2pm. Patient states she will contact son and daughter to provide update.  Patient will discharge today per MD order. Patient will discharge to [facility] RN to call report prior to transportation to: [phone] Transportation: [facility]-to be called at 2pm per SNF  CSW sent discharge summary to SNF for review.   Nonnie Done, LCSW [phone]  [id number], 2S 15-16 and Psychiatric Service Line  Licensed Clinical Social Worker

## 2015-12-13 NOTE — NC FL2 (Signed)
[place] LEVEL OF CARE SCREENING TOOL     IDENTIFICATION  Patient Name: Sherri Murray Birthdate: Jan 09, 1941 Sex: female Admission Date (Current Location): 12/11/2015  [facility] and [place] Number:  Herbalist and Address:  [facility], [place] [address] [place], [place] [address]      Provider Number: [id number]  Attending Physician Name and Address:  Marybelle Killings, MD  Relative Name and Phone Number:       Current Level of Care: Hospital Recommended Level of Care: [place] Prior Approval Number:    Date Approved/Denied:   PASRR Number: [id number]  Discharge Plan: SNF    Current Diagnoses: Patient Active Problem List   Diagnosis Date Noted  . Status post total right knee replacement 12/11/2015  . Advanced directives, counseling/discussion 10/18/2015  . Acute diarrhea 08/13/2015  . Total body pain 08/13/2015  . Humeral head fracture 06/05/2015  . Diastolic dysfunction 0000000  . Mild mitral regurgitation 12/20/2014  . Benign essential HTN 10/19/2014  . High cholesterol 05/01/2014  . Prediabetes 05/01/2014  . Allergic rhinitis 09/28/2013  . Mild intermittent asthma 09/28/2013  . Ischemic colitis, hx of 09/28/2013  . Hx of migraines 09/28/2013  . GERD (gastroesophageal reflux disease) 09/28/2013  . 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 Varicose veins of lower extremities with other complications [id number]  . Osteoporosis screening 04/21/2013  . Incontinence of urine   . Acute blood loss anemia, Hx of 03/16/2012  . IBS (irritable bowel syndrome) 02/27/2012    Orientation RESPIRATION BLADDER Height & Weight     Self, Time, Situation, Place  Normal Incontinent Weight: 159 lb (72.122 kg) Height:  5\' 3"  (160 cm)  BEHAVIORAL SYMPTOMS/MOOD NEUROLOGICAL BOWEL NUTRITION STATUS      Continent Diet  AMBULATORY STATUS COMMUNICATION OF NEEDS Skin   Limited Assist Verbally Surgical wounds                        Personal Care Assistance Level of Assistance  Bathing, Dressing Bathing Assistance: Limited assistance   Dressing Assistance: Limited assistance     Functional Limitations Info             SPECIAL CARE FACTORS FREQUENCY  PT (By licensed PT), OT (By licensed OT)     PT Frequency: 5/wk OT Frequency: 5/wk            Contractures      Additional Factors Info  Code Status, Allergies Code Status Info: FULL Allergies Info: Banana, Barium-containing Compounds, Iodinated Diagnostic Agents, Strawberry Extract, Apple, Aspirin, Bee Venom, Celery OilBanana, Barium-containing Compounds, Iodinated Diagnostic Agents, Strawberry Extract, Apple, Aspirin, Bee Venom, Celery Oil           Current Medications (12/13/2015):  This is the current hospital active medication list Current Facility-Administered Medications  Medication Dose Route Frequency Provider Last Rate Last Dose  . 0.45 % sodium chloride infusion   Intravenous Continuous Lanae Crumbly, PA-C 85 mL/hr at 12/13/15 0532 1,000 mL at 12/13/15 0532  . acetaminophen (TYLENOL) tablet 650 mg  650 mg Oral Q6H PRN Lanae Crumbly, PA-C       Or  . acetaminophen (TYLENOL) suppository 650 mg  650 mg Rectal Q6H PRN Lanae Crumbly, PA-C      . albuterol (PROVENTIL) (2.5 MG/3ML) 0.083% nebulizer solution 2.5 mg  2.5 mg Nebulization Q6H PRN Jake Church Masters, [facility]      . aspirin EC tablet 325 mg  325  mg Oral Q breakfast Lanae Crumbly, PA-C   325 mg at 12/13/15 [id number]  . dicyclomine (BENTYL) capsule 10 mg  10 mg Oral TID AC & HS Lanae Crumbly, PA-C   10 mg at 12/13/15 0743  . docusate sodium (COLACE) capsule 100 mg  100 mg Oral BID Lanae Crumbly, PA-C   100 mg at 12/13/15 [id number]  . famotidine (PEPCID) tablet 10 mg  10 mg Oral Daily Lanae Crumbly, PA-C   10 mg at 12/13/15 0948  . hydrochlorothiazide (MICROZIDE) capsule 12.5 mg  12.5 mg Oral Daily Marybelle Killings, MD   12.5 mg at 12/13/15 0948  . HYDROmorphone (DILAUDID) injection 0.5-1 mg   0.5-1 mg Intravenous Q3H PRN Lanae Crumbly, PA-C   0.5 mg at 12/12/15 0920  . ketorolac (TORADOL) 30 MG/ML injection 30 mg  30 mg Intravenous Q6H PRN Leandrew Koyanagi, MD   30 mg at 12/12/15 1736  . lactated ringers infusion   Intravenous Continuous Jillyn Hidden, MD 10 mL/hr at 12/11/15 1108    . loratadine (CLARITIN) tablet 10 mg  10 mg Oral BID PRN Lanae Crumbly, PA-C      . losartan (COZAAR) tablet 50 mg  50 mg Oral Daily Marybelle Killings, MD   50 mg at 12/13/15 0948  . menthol-cetylpyridinium (CEPACOL) lozenge 3 mg  1 lozenge Oral PRN Lanae Crumbly, PA-C       Or  . phenol (CHLORASEPTIC) mouth spray 1 spray  1 spray Mouth/Throat PRN Lanae Crumbly, PA-C      . methocarbamol (ROBAXIN) tablet 500 mg  500 mg Oral Q6H PRN Lanae Crumbly, PA-C   500 mg at 12/13/15 0805   Or  . methocarbamol (ROBAXIN) 500 mg in dextrose 5 % 50 mL IVPB  500 mg Intravenous Q6H PRN Lanae Crumbly, PA-C      . metoCLOPramide (REGLAN) tablet 5-10 mg  5-10 mg Oral Q8H PRN Lanae Crumbly, PA-C       Or  . metoCLOPramide (REGLAN) injection 5-10 mg  5-10 mg Intravenous Q8H PRN Lanae Crumbly, PA-C      . montelukast (SINGULAIR) tablet 10 mg  10 mg Oral QHS Lanae Crumbly, PA-C   10 mg at 12/12/15 2057  . ondansetron (ZOFRAN) tablet 4 mg  4 mg Oral Q6H PRN Lanae Crumbly, PA-C       Or  . ondansetron [facility]) injection 4 mg  4 mg Intravenous Q6H PRN Lanae Crumbly, PA-C   4 mg at 12/12/15 1006  . oxybutynin (DITROPAN-XL) 24 hr tablet 10 mg  10 mg Oral QHS Lanae Crumbly, PA-C   10 mg at 12/12/15 2057  . oxyCODONE (Oxy IR/ROXICODONE) immediate release tablet 5-10 mg  5-10 mg Oral Q4H PRN Lanae Crumbly, PA-C   10 mg at 12/13/15 0948  . pantoprazole (PROTONIX) EC tablet 40 mg  40 mg Oral Daily Lanae Crumbly, PA-C   40 mg at 12/13/15 0948  . polyethylene glycol (MIRALAX / GLYCOLAX) packet 17 g  17 g Oral Daily PRN Lanae Crumbly, PA-C         Discharge Medications: Please see discharge summary for a list of discharge  medications.  Relevant Imaging Results:  Relevant Lab Results:   Additional Information SS#: [id number]  Cranford Mon, [place]

## 2015-12-13 NOTE — Evaluation (Signed)
Occupational Therapy Evaluation Patient Details Name: Sherri Murray MRN: [id number] DOB: 12-12-1940 Today's Date: 12/13/2015    History of Present Illness 75 yo s/p R TKA   Clinical Impression   Patient presenting with acute pain, decreased I in self care, decreased I in functional transfers/mobility, decreased safety awareness, and decreased activity tolerance.  Patient reports being I PTA. Patient currently functioning at min - max A. Patient will benefit from acute OT to increase overall independence in the areas of ADLs, functional mobility, safety in order to safely discharge to next venue of care. 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 She has poor safety awareness and would benefit from short term rehab stay for further therapeutic intervention.      Follow Up Recommendations  SNF    Equipment Recommendations  Other (comment) (defer to next venue of care)    Recommendations for Other Services       Precautions / Restrictions Precautions Precautions: Knee;Fall Restrictions Weight Bearing Restrictions: Yes RLE Weight Bearing: Weight bearing as tolerated      Mobility Bed Mobility Overal bed mobility: Needs Assistance Bed Mobility: Supine to Sit;Sit to Supine     Supine to sit: Min assist     General bed mobility comments: VCs for proper technique and min A for R LE  Transfers Overall transfer level: Needs assistance Equipment used: Rolling walker (2 wheeled)   Sit to Stand: Mod assist Stand pivot transfers: Mod assist       General transfer comment: lifting assistance for STS, VCs for hand placement and advancement of RW safely.    Balance Overall balance assessment: History of Falls           ADL Overall ADL's : Needs assistance/impaired     Grooming: Set up;Sitting   Upper Body Bathing: Set up;Sitting   Lower Body Bathing: Moderate assistance;Sit to/from stand   Upper Body Dressing : Set up;Sitting   Lower Body Dressing: Sit  to/from stand;Maximal assistance   Toilet Transfer: Moderate assistance;BSC;RW   Toileting- Clothing Manipulation and Hygiene: Maximal assistance;Sit to/from stand         General ADL Comments: Pt required coaxing for participation this session. Pt in bed with head elevated eating breakfast. Sheets soaked with urine and pt reports, "I have been using the bathroom all night." Pt agreeable to [facility] transfer but declined to ambulate into bathroom. Pt performed sit <>stand with mod A for lifting assistance. Pt with decreased safety awarness with RW and functional transfers. Pt requiring assistance to advance RW for safety and pt not following commands for safety transfer. Pt able to perform hygiene but requiring assistance with clothing management. Pt amulating 10' to recliner chair with RW and "freezing: midway there from "pain" and then begins to attempts to lean over RW causing mod A from therapist  for safety. Pt seated in recliner chair at end of session with breakfast in front of her.                Pertinent Vitals/Pain Pain Assessment: 0-10 Pain Score: 10-Worst pain ever Pain Location: R knee Pain Descriptors / Indicators: Aching;Grimacing;Crying;Sore;Moaning Pain Intervention(s): Limited activity within patient's tolerance;Monitored during session;Repositioned;RN gave pain meds during session;Relaxation     Hand Dominance Right   Extremity/Trunk Assessment Upper Extremity Assessment Upper Extremity Assessment: Generalized weakness   Lower Extremity Assessment Lower Extremity Assessment: Defer to PT evaluation       Communication Communication Communication: No difficulties   Cognition Arousal/Alertness: Awake/alert Behavior During Therapy: [employer]  for tasks assessed/performed Overall Cognitive Status: Within Functional Limits for tasks assessed                  Home Living Family/patient expects to be discharged to:: Private residence Living Arrangements:  Spouse/significant other Available Help at Discharge: Family (lives with boyfriend who pt was caregiver for prior to surgery. He is in wheelchair. ) Type of Home: House Home Access: Stairs to enter [employer] of Steps: 1-5, depending on how she goes inside Entrance Stairs-Rails: Can reach both;Right;Left Home Layout: One level     Bathroom Shower/Tub: Walk-in shower;Tub/shower unit;Curtain;Other (comment) (pt uses 3 in 1 in shower)   Bathroom Toilet: Standard Bathroom Accessibility: Yes How Accessible: Accessible via walker;Accessible via wheelchair Home Equipment: Wheelchair - manual;Cane - single point;Walker - 2 wheels;Bedside commode          Prior Functioning/Environment Level of Independence: Independent             OT Diagnosis: Generalized weakness;Acute pain   OT Problem List: Decreased strength;Decreased activity tolerance;Impaired balance (sitting and/or standing);Decreased safety awareness;Pain;Decreased knowledge of use of DME or AE;Cardiopulmonary status limiting activity   OT Treatment/Interventions: Self-care/ADL training;Balance training;Therapeutic exercise;Therapeutic activities;Energy conservation;DME and/or AE instruction;Patient/family education    OT Goals(Current goals can be found in the care plan section) Acute Rehab OT Goals Patient Stated Goal: to move better and have less pain OT Goal Formulation: With patient Time For Goal Achievement: 12/27/15 Potential to Achieve Goals: Fair ADL Goals Pt Will Perform Grooming: with modified independence;sitting Pt Will Perform Upper Body Bathing: with supervision;sitting Pt Will Perform Lower Body Bathing: with min guard assist;sit to/from stand Pt Will Perform Upper Body Dressing: with supervision;sitting Pt Will Perform Lower Body Dressing: with min guard assist;sit to/from stand Pt Will Transfer to Toilet: with supervision;bedside commode;ambulating Pt Will Perform Toileting - Clothing  Manipulation and hygiene: with supervision;sit to/from stand  OT Frequency: Min 2X/week   Barriers to D/C: Decreased caregiver support  boyfriend is in wheelchair and pt is normally his caregiver.          End of Session Equipment Utilized During Treatment: Rolling walker CPM Right Knee CPM Right Knee: Off  Activity Tolerance: Patient limited by pain Patient left: in chair;with call bell/phone within reach;with bed alarm set   Time: 0735-0800 OT Time Calculation (min): 25 min Charges:  OT General Charges $OT Visit: 1 Procedure OT Evaluation $OT Eval Moderate Complexity: 1 Procedure OT Treatments $Self Care/Home Management : 8-22 mins G-Codes:    Phineas Semen, MS, OTR/L 12/13/2015, 9:03 AM

## 2015-12-13 NOTE — Progress Notes (Signed)
Covering CSW called for transport to [place].  CSW signing off  Domenica Reamer, [place] Social Worker [phone]

## 2015-12-16 ENCOUNTER — Encounter: Payer: Self-pay | Admitting: Internal Medicine

## 2015-12-16 ENCOUNTER — Non-Acute Institutional Stay (SKILLED_NURSING_FACILITY): Payer: Medicare Other | Admitting: Internal Medicine

## 2015-12-16 DIAGNOSIS — M1711 Unilateral primary osteoarthritis, right knee: Secondary | ICD-10-CM | POA: Diagnosis not present

## 2015-12-16 DIAGNOSIS — N3281 Overactive bladder: Secondary | ICD-10-CM

## 2015-12-16 DIAGNOSIS — K589 Irritable bowel syndrome without diarrhea: Secondary | ICD-10-CM

## 2015-12-16 DIAGNOSIS — K219 Gastro-esophageal reflux disease without esophagitis: Secondary | ICD-10-CM

## 2015-12-16 DIAGNOSIS — Z96651 Presence of right artificial knee joint: Secondary | ICD-10-CM

## 2015-12-16 DIAGNOSIS — G4730 Sleep apnea, unspecified: Secondary | ICD-10-CM

## 2015-12-16 DIAGNOSIS — J301 Allergic rhinitis due to pollen: Secondary | ICD-10-CM

## 2015-12-16 DIAGNOSIS — I10 Essential (primary) hypertension: Secondary | ICD-10-CM | POA: Diagnosis not present

## 2015-12-16 DIAGNOSIS — E785 Hyperlipidemia, unspecified: Secondary | ICD-10-CM
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
## 2015-12-24 DIAGNOSIS — M1711 Unilateral primary osteoarthritis, right knee: Secondary | ICD-10-CM | POA: Diagnosis not present

## 2015-12-25 ENCOUNTER — Non-Acute Institutional Stay (SKILLED_NURSING_FACILITY): Payer: Medicare Other | Admitting: Adult Health

## 2015-12-25 ENCOUNTER — Encounter: Payer: Self-pay | Admitting: Adult Health

## 2015-12-25 DIAGNOSIS — Z96651 Presence of right artificial knee joint: Secondary | ICD-10-CM | POA: Diagnosis not present

## 2015-12-25 DIAGNOSIS — D62 Acute posthemorrhagic anemia: Secondary | ICD-10-CM | POA: Diagnosis not present

## 2015-12-25 DIAGNOSIS — M1711 Unilateral primary osteoarthritis, right knee: Secondary | ICD-10-CM | POA: Insufficient documentation

## 2015-12-25 DIAGNOSIS — K589 Irritable bowel syndrome without diarrhea: Secondary | ICD-10-CM | POA: Diagnosis not present

## 2015-12-25 DIAGNOSIS — K219 Gastro-esophageal reflux disease without esophagitis: Secondary | ICD-10-CM | POA: Diagnosis not present

## 2015-12-25 DIAGNOSIS — I10 Essential (primary) hypertension: Secondary | ICD-10-CM | POA: Diagnosis not present

## 2015-12-25 DIAGNOSIS — J4520 Mild intermittent asthma, uncomplicated: Secondary | ICD-10-CM
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
## 2015-12-28 ENCOUNTER — Other Ambulatory Visit: Payer: Self-pay | Admitting: Family Medicine

## 2015-12-28 DIAGNOSIS — J4520 Mild intermittent asthma, uncomplicated: Secondary | ICD-10-CM | POA: Diagnosis not present

## 2015-12-28 DIAGNOSIS — K219 Gastro-esophageal reflux disease without esophagitis: Secondary | ICD-10-CM | POA: Diagnosis not present

## 2015-12-28 DIAGNOSIS — M1990 Unspecified osteoarthritis, unspecified site: Secondary | ICD-10-CM | POA: Diagnosis not present

## 2015-12-28 DIAGNOSIS — E782 Mixed hyperlipidemia: Secondary | ICD-10-CM | POA: Diagnosis not present

## 2015-12-28 DIAGNOSIS — Z471 Aftercare following joint replacement surgery: Secondary | ICD-10-CM | POA: Diagnosis not present

## 2015-12-28 DIAGNOSIS — G4730 Sleep apnea, unspecified: Secondary | ICD-10-CM | POA: Diagnosis not present

## 2015-12-28 DIAGNOSIS — Z79891 Long term (current) use of opiate analgesic: Secondary | ICD-10-CM | POA: Diagnosis not present

## 2015-12-28 DIAGNOSIS — K580 Irritable bowel syndrome with diarrhea: Secondary | ICD-10-CM | POA: Diagnosis not present

## 2015-12-28 DIAGNOSIS — I10 Essential (primary) hypertension: Secondary | ICD-10-CM | POA: Diagnosis not present

## 2015-12-28 DIAGNOSIS — N3281 Overactive bladder: Secondary | ICD-10-CM | POA: Diagnosis not present

## 2015-12-28 DIAGNOSIS — Z96651 Presence of right artificial knee joint: Secondary | ICD-10-CM | POA: Diagnosis not present

## 2015-12-28 NOTE — Telephone Encounter (Signed)
Last office visit 10/18/2015.  Last refilled 11/19/15 for #120 with no refills.  Ok to refill?

## 2015-12-29 DIAGNOSIS — I10 Essential (primary) hypertension: Secondary | ICD-10-CM | POA: Diagnosis not present

## 2015-12-29 DIAGNOSIS — Z96651 Presence of right artificial knee joint: Secondary | ICD-10-CM | POA: Diagnosis not present

## 2015-12-29 DIAGNOSIS — K580 Irritable bowel syndrome with diarrhea: Secondary | ICD-10-CM | POA: Diagnosis not present

## 2015-12-29 DIAGNOSIS — Z471 Aftercare following joint replacement surgery: Secondary | ICD-10-CM | POA: Diagnosis not present

## 2015-12-29 DIAGNOSIS — M1990 Unspecified osteoarthritis, unspecified site: Secondary | ICD-10-CM | POA: Diagnosis not present

## 2015-12-29 DIAGNOSIS — J4520 Mild intermittent asthma, uncomplicated: Secondary | ICD-10-CM | POA: Diagnosis not present

## 2015-12-29 DIAGNOSIS — K219 Gastro-esophageal reflux disease without esophagitis: Secondary | ICD-10-CM | POA: Diagnosis not present

## 2015-12-29 DIAGNOSIS — G4730 Sleep apnea, unspecified: Secondary | ICD-10-CM | POA: Diagnosis not present

## 2015-12-29 DIAGNOSIS — N3281 Overactive bladder: Secondary | ICD-10-CM | POA: Diagnosis not present

## 2015-12-29 DIAGNOSIS — Z79891 Long term (current) use of opiate analgesic: Secondary | ICD-10-CM | POA: Diagnosis not present

## 2015-12-29 DIAGNOSIS — E782 Mixed hyperlipidemia: Secondary | ICD-10-CM | POA: Diagnosis not present

## 2016-01-01 DIAGNOSIS — G4730 Sleep apnea, unspecified: Secondary | ICD-10-CM | POA: Diagnosis not present

## 2016-01-01 DIAGNOSIS — Z96651 Presence of right artificial knee joint: Secondary | ICD-10-CM | POA: Diagnosis not present

## 2016-01-01 DIAGNOSIS — E782 Mixed hyperlipidemia: Secondary | ICD-10-CM | POA: Diagnosis not present

## 2016-01-01 DIAGNOSIS — Z471 Aftercare following joint replacement surgery: Secondary | ICD-10-CM | POA: Diagnosis not present

## 2016-01-01 DIAGNOSIS — K219 Gastro-esophageal reflux disease without esophagitis: Secondary | ICD-10-CM | POA: Diagnosis not present

## 2016-01-01 DIAGNOSIS — I10 Essential (primary) hypertension: Secondary | ICD-10-CM | POA: Diagnosis not present

## 2016-01-01 DIAGNOSIS — Z79891 Long term (current) use of opiate analgesic: Secondary | ICD-10-CM | POA: Diagnosis not present

## 2016-01-01 DIAGNOSIS — J4520 Mild intermittent asthma, uncomplicated: Secondary | ICD-10-CM | POA: Diagnosis not present

## 2016-01-01 DIAGNOSIS — M1990 Unspecified osteoarthritis, unspecified site: Secondary | ICD-10-CM | POA: Diagnosis not present

## 2016-01-01 DIAGNOSIS — N3281 Overactive bladder: Secondary | ICD-10-CM | POA: Diagnosis not present

## 2016-01-01 DIAGNOSIS — K580 Irritable bowel syndrome with diarrhea: Secondary | ICD-10-CM | POA: Diagnosis not present

## 2016-01-02 ENCOUNTER — Encounter: Payer: Self-pay | Admitting: Family Medicine

## 2016-01-02 ENCOUNTER — Ambulatory Visit (INDEPENDENT_AMBULATORY_CARE_PROVIDER_SITE_OTHER): Payer: Medicare Other | Admitting: Family Medicine

## 2016-01-02 VITALS — BP 153/88 | HR 69 | Temp 97.9°F | Ht 64.0 in | Wt 156.5 lb

## 2016-01-02 DIAGNOSIS — K219 Gastro-esophageal reflux disease without esophagitis: Secondary | ICD-10-CM | POA: Diagnosis not present

## 2016-01-02 DIAGNOSIS — G4730 Sleep apnea, unspecified: Secondary | ICD-10-CM | POA: Diagnosis not present

## 2016-01-02 DIAGNOSIS — Z471 Aftercare following joint replacement surgery: Secondary | ICD-10-CM | POA: Diagnosis not present

## 2016-01-02 DIAGNOSIS — M1990 Unspecified osteoarthritis, unspecified site: Secondary | ICD-10-CM | POA: Diagnosis not present

## 2016-01-02 DIAGNOSIS — R197 Diarrhea, unspecified: Secondary | ICD-10-CM

## 2016-01-02 DIAGNOSIS — Z96651 Presence of right artificial knee joint: Secondary | ICD-10-CM | POA: Diagnosis not present

## 2016-01-02 DIAGNOSIS — K580 Irritable bowel syndrome with diarrhea: Secondary | ICD-10-CM | POA: Diagnosis not present

## 2016-01-02 DIAGNOSIS — E782 Mixed hyperlipidemia: Secondary | ICD-10-CM | POA: Diagnosis not present

## 2016-01-02 DIAGNOSIS — D62 Acute posthemorrhagic anemia: Secondary | ICD-10-CM

## 2016-01-02 DIAGNOSIS — J4520 Mild intermittent asthma, uncomplicated: Secondary | ICD-10-CM | POA: Diagnosis not present

## 2016-01-02 DIAGNOSIS — N3281 Overactive bladder: Secondary | ICD-10-CM | POA: Diagnosis not present

## 2016-01-02 DIAGNOSIS — Z79891 Long term (current) use of opiate analgesic: Secondary | ICD-10-CM | POA: Diagnosis not present

## 2016-01-02 DIAGNOSIS — I10 Essential (primary) hypertension: Secondary | ICD-10-CM | POA: Diagnosis not present

## 2016-01-02 LAB — CBC WITH DIFFERENTIAL/PLATELET
BASOS PCT: 0.6 % (ref 0.0–3.0)
Basophils Absolute: 0 10*3/uL (ref 0.0–0.1)
EOS ABS: 0 10*3/uL (ref 0.0–0.7)
Eosinophils Relative: 0.5 % (ref 0.0–5.0)
HEMATOCRIT: 35.7 % — AB (ref 36.0–46.0)
Hemoglobin: 11.9 g/dL — ABNORMAL LOW (ref 12.0–15.0)
Lymphocytes Relative: 26.2 % (ref 12.0–46.0)
Lymphs Abs: 2.1 10*3/uL (ref 0.7–4.0)
MCHC: 33.4 g/dL (ref 30.0–36.0)
MCV: 91.5 fl (ref 78.0–100.0)
MONO ABS: 0.6 10*3/uL (ref 0.1–1.0)
Monocytes Relative: 7.4 % (ref 3.0–12.0)
NEUTROS ABS: 5.2 10*3/uL (ref 1.4–7.7)
Neutrophils Relative %: 65.3 % (ref 43.0–77.0)
PLATELETS: 483 10*3/uL — AB (ref 150.0–400.0)
RBC: 3.89 Mil/uL (ref 3.87–5.11)
RDW: 14.9 % (ref 11.5–15.5)
WBC: 7.9 10*3/uL (ref 4.0–10.5)

## 2016-01-02 MED ORDER — FERROUS SULFATE 325 (65 FE) MG PO TABS: 325.0000 mg | ORAL_TABLET | Freq: Two times a day (BID) | ORAL | Status: DC

## 2016-01-02 NOTE — Assessment & Plan Note (Signed)
Check CBC today.  Plan to start on iron 325 mg 2 times daily

## 2016-01-02 NOTE — Assessment & Plan Note (Signed)
No clear infectious symptoms. Likely IBS flare. Treat with bentyrl. Iron will have a constipating effect.

## 2016-01-02 NOTE — Progress Notes (Signed)
Pre visit review using our clinic review tool, if applicable. No additional management support is needed unless otherwise documented below in the visit note. 

## 2016-01-02 NOTE — Patient Instructions (Addendum)
Stop at lab on way out.  Start iron ferrous sulfate 325 mg twice daily.  Can use bentyl for  Irritable bowel syndrome.

## 2016-01-02 NOTE — Progress Notes (Signed)
   Subjective:    Patient ID: Sherri Murray, female    DOB: [birth year], 75 y.o.   MRN: [id number]  HPI  75 year old female presents for follow up after right knee replacement.  She was informed that she was anemic after the surgery.   Pain is moderately controlled on oxycodone. Had to use tramadol given ran out but has prescription on the way from [facility].  Icing and doing PT.   On 2/20 Hg was 13.3.  Surgery 12/11/2015  Post op cbc shows 8.9 on 3/2, 8.5 Hg on 3/3.  She was at rehab NH from 3/16.  Not started on any iron, but does take multivitamin.  Hx of intermittent diarrhea given IBS. Using bentyl for meals. Hx of fecal incontinence with  colonoscopy showing lax anal sphincter, was pending [facility] referral for possible anal manometry. No fever, no chills, no N/V.  Mild abd pain prior to BM, relieved with BM. Wt Readings from Last 3 Encounters:  01/02/16 156 lb 8 oz (70.988 kg)  12/25/15 159 lb (72.122 kg)  12/11/15 159 lb (72.122 kg)     Review of Systems  Constitutional: Negative for fever and fatigue.  HENT: Negative for ear pain.   Eyes: Negative for pain.  Respiratory: Negative for chest tightness and shortness of breath.   Cardiovascular: Negative for chest pain, palpitations and leg swelling.  Gastrointestinal: Positive for abdominal pain and diarrhea. Negative for blood in stool.  Genitourinary: Negative for dysuria.       Objective:   Physical Exam  Constitutional: Vital signs are normal. She appears well-developed and well-nourished. She is cooperative.  Non-toxic appearance. She does not appear ill. No distress.  HENT:  Head: Normocephalic.  Right Ear: Hearing, tympanic membrane, external ear and ear canal normal. Tympanic membrane is not erythematous, not retracted and not bulging.  Left Ear: Hearing, tympanic membrane, external ear and ear canal normal. Tympanic membrane is not erythematous, not retracted and not bulging.  Nose: No mucosal edema or rhinorrhea.  Right sinus exhibits no maxillary sinus tenderness and no frontal sinus tenderness. Left sinus exhibits no maxillary sinus tenderness and no frontal sinus tenderness.  Mouth/Throat: Uvula is midline, oropharynx is clear and moist and mucous membranes are normal.  Eyes: Conjunctivae, EOM and lids are normal. Pupils are equal, round, and reactive to light. Lids are everted and swept, no foreign bodies found.  Neck: Trachea normal and normal range of motion. Neck supple. Carotid bruit is not present. No thyroid mass and no thyromegaly present.  Cardiovascular: Normal rate, regular rhythm, S1 normal, S2 normal, normal heart sounds, intact distal pulses and normal pulses.  Exam reveals no gallop and no friction rub.   No murmur heard. Pulmonary/Chest: Effort normal and breath sounds normal. No tachypnea. No respiratory distress. She has no decreased breath sounds. She has no wheezes. She has no rhonchi. She has no rales.  Abdominal: Soft. Normal appearance and bowel sounds are normal. There is no hepatosplenomegaly. There is no tenderness. There is no CVA tenderness.  Musculoskeletal:  Right knee with steri strips and healing surgical site.  Neurological: She is alert.  Skin: Skin is warm, dry and intact. No rash noted.  Psychiatric: Her speech is normal and behavior is normal. Judgment and thought content normal. Her mood appears not anxious. Cognition and memory are normal. She does not exhibit a depressed mood.          Assessment & Plan:

## 2016-01-03 DIAGNOSIS — M1990 Unspecified osteoarthritis, unspecified site: Secondary | ICD-10-CM | POA: Diagnosis not present

## 2016-01-03 DIAGNOSIS — I10 Essential (primary) hypertension: Secondary | ICD-10-CM | POA: Diagnosis not present

## 2016-01-03 DIAGNOSIS — G4730 Sleep apnea, unspecified: Secondary | ICD-10-CM | POA: Diagnosis not present

## 2016-01-03 DIAGNOSIS — Z96651 Presence of right artificial knee joint: Secondary | ICD-10-CM | POA: Diagnosis not present

## 2016-01-03 DIAGNOSIS — N3281 Overactive bladder: Secondary | ICD-10-CM | POA: Diagnosis not present

## 2016-01-03 DIAGNOSIS — K219 Gastro-esophageal reflux disease without esophagitis: Secondary | ICD-10-CM | POA: Diagnosis not present

## 2016-01-03 DIAGNOSIS — J4520 Mild intermittent asthma, uncomplicated: Secondary | ICD-10-CM | POA: Diagnosis not present

## 2016-01-03 DIAGNOSIS — Z471 Aftercare following joint replacement surgery: Secondary | ICD-10-CM | POA: Diagnosis not present

## 2016-01-03 DIAGNOSIS — E782 Mixed hyperlipidemia: Secondary | ICD-10-CM | POA: Diagnosis not present

## 2016-01-03 DIAGNOSIS — Z79891 Long term (current) use of opiate analgesic: Secondary | ICD-10-CM | POA: Diagnosis not present

## 2016-01-03 DIAGNOSIS — K580 Irritable bowel syndrome with diarrhea: Secondary | ICD-10-CM | POA: Diagnosis not present

## 2016-01-07 ENCOUNTER — Telehealth: Payer: Self-pay | Admitting: *Deleted

## 2016-01-07 DIAGNOSIS — K219 Gastro-esophageal reflux disease without esophagitis: Secondary | ICD-10-CM | POA: Diagnosis not present

## 2016-01-07 DIAGNOSIS — K580 Irritable bowel syndrome with diarrhea: Secondary | ICD-10-CM | POA: Diagnosis not present

## 2016-01-07 DIAGNOSIS — M1990 Unspecified osteoarthritis, unspecified site: Secondary | ICD-10-CM | POA: Diagnosis not present

## 2016-01-07 DIAGNOSIS — Z96651 Presence of right artificial knee joint: Secondary | ICD-10-CM | POA: Diagnosis not present

## 2016-01-07 DIAGNOSIS — Z79891 Long term (current) use of opiate analgesic: Secondary | ICD-10-CM | POA: Diagnosis not present

## 2016-01-07 DIAGNOSIS — N3281 Overactive bladder: Secondary | ICD-10-CM | POA: Diagnosis not present

## 2016-01-07 DIAGNOSIS — J4520 Mild intermittent asthma, uncomplicated: Secondary | ICD-10-CM | POA: Diagnosis not present

## 2016-01-07 DIAGNOSIS — I10 Essential (primary) hypertension: Secondary | ICD-10-CM | POA: Diagnosis not present

## 2016-01-07 DIAGNOSIS — Z471 Aftercare following joint replacement surgery: Secondary | ICD-10-CM | POA: Diagnosis not present

## 2016-01-07 DIAGNOSIS — G4730 Sleep apnea, unspecified: Secondary | ICD-10-CM | POA: Diagnosis not present

## 2016-01-07 DIAGNOSIS — E782 Mixed hyperlipidemia: Secondary | ICD-10-CM | POA: Diagnosis not present

## 2016-01-07 NOTE — Telephone Encounter (Signed)
Left message for [name] with [employer] giving verbal okay to order incontinent supplies for Sherri Murray.

## 2016-01-07 NOTE — Telephone Encounter (Signed)
[name], OT with [employer] left voicemail at triage requesting a verbal order so she can order incontinent supplies for pt

## 2016-01-09 DIAGNOSIS — M1990 Unspecified osteoarthritis, unspecified site: Secondary | ICD-10-CM | POA: Diagnosis not present

## 2016-01-09 DIAGNOSIS — I10 Essential (primary) hypertension: Secondary | ICD-10-CM | POA: Diagnosis not present

## 2016-01-09 DIAGNOSIS — Z79891 Long term (current) use of opiate analgesic: Secondary | ICD-10-CM | POA: Diagnosis not present

## 2016-01-09 DIAGNOSIS — K219 Gastro-esophageal reflux disease without esophagitis: Secondary | ICD-10-CM | POA: Diagnosis not present

## 2016-01-09 DIAGNOSIS — N3281 Overactive bladder: Secondary | ICD-10-CM | POA: Diagnosis not present

## 2016-01-09 DIAGNOSIS — J4520 Mild intermittent asthma, uncomplicated: Secondary | ICD-10-CM | POA: Diagnosis not present

## 2016-01-09 DIAGNOSIS — G4730 Sleep apnea, unspecified: Secondary | ICD-10-CM | POA: Diagnosis not present

## 2016-01-09 DIAGNOSIS — Z471 Aftercare following joint replacement surgery: Secondary | ICD-10-CM | POA: Diagnosis not present

## 2016-01-09 DIAGNOSIS — Z96651 Presence of right artificial knee joint: Secondary | ICD-10-CM | POA: Diagnosis not present

## 2016-01-09 DIAGNOSIS — K580 Irritable bowel syndrome with diarrhea: Secondary | ICD-10-CM | POA: Diagnosis not present

## 2016-01-09 DIAGNOSIS — E782 Mixed hyperlipidemia: Secondary | ICD-10-CM | POA: Diagnosis not present

## 2016-01-10 DIAGNOSIS — J4520 Mild intermittent asthma, uncomplicated: Secondary | ICD-10-CM | POA: Diagnosis not present

## 2016-01-10 DIAGNOSIS — K580 Irritable bowel syndrome with diarrhea: Secondary | ICD-10-CM | POA: Diagnosis not present

## 2016-01-10 DIAGNOSIS — I10 Essential (primary) hypertension: Secondary | ICD-10-CM | POA: Diagnosis not present

## 2016-01-10 DIAGNOSIS — E782 Mixed hyperlipidemia: Secondary | ICD-10-CM | POA: Diagnosis not present

## 2016-01-10 DIAGNOSIS — M1990 Unspecified osteoarthritis, unspecified site: Secondary | ICD-10-CM | POA: Diagnosis not present

## 2016-01-10 DIAGNOSIS — N3281 Overactive bladder: Secondary | ICD-10-CM | POA: Diagnosis not present

## 2016-01-10 DIAGNOSIS — K219 Gastro-esophageal reflux disease without esophagitis: Secondary | ICD-10-CM | POA: Diagnosis not present

## 2016-01-10 DIAGNOSIS — G4730 Sleep apnea, unspecified: Secondary | ICD-10-CM | POA: Diagnosis not present

## 2016-01-10 DIAGNOSIS — Z471 Aftercare following joint replacement surgery: Secondary | ICD-10-CM | POA: Diagnosis not present

## 2016-01-10 DIAGNOSIS — Z96651 Presence of right artificial knee joint: Secondary | ICD-10-CM | POA: Diagnosis not present

## 2016-01-10 DIAGNOSIS — Z79891 Long term (current) use of opiate analgesic: Secondary | ICD-10-CM | POA: Diagnosis not present

## 2016-01-14 DIAGNOSIS — E782 Mixed hyperlipidemia: Secondary | ICD-10-CM | POA: Diagnosis not present

## 2016-01-14 DIAGNOSIS — Z79891 Long term (current) use of opiate analgesic: Secondary | ICD-10-CM | POA: Diagnosis not present

## 2016-01-14 DIAGNOSIS — Z96651 Presence of right artificial knee joint: Secondary | ICD-10-CM | POA: Diagnosis not present

## 2016-01-14 DIAGNOSIS — K580 Irritable bowel syndrome with diarrhea: Secondary | ICD-10-CM | POA: Diagnosis not present

## 2016-01-14 DIAGNOSIS — I10 Essential (primary) hypertension: Secondary | ICD-10-CM | POA: Diagnosis not present

## 2016-01-14 DIAGNOSIS — G4730 Sleep apnea, unspecified: Secondary | ICD-10-CM | POA: Diagnosis not present

## 2016-01-14 DIAGNOSIS — J4520 Mild intermittent asthma, uncomplicated: Secondary | ICD-10-CM | POA: Diagnosis not present

## 2016-01-14 DIAGNOSIS — K219 Gastro-esophageal reflux disease without esophagitis: Secondary | ICD-10-CM | POA: Diagnosis not present

## 2016-01-14 DIAGNOSIS — N3281 Overactive bladder: Secondary | ICD-10-CM | POA: Diagnosis not present

## 2016-01-14 DIAGNOSIS — Z471 Aftercare following joint replacement surgery: Secondary | ICD-10-CM | POA: Diagnosis not present

## 2016-01-14 DIAGNOSIS — M1990 Unspecified osteoarthritis, unspecified site: Secondary | ICD-10-CM | POA: Diagnosis not present

## 2016-01-16 DIAGNOSIS — N3281 Overactive bladder: Secondary | ICD-10-CM | POA: Diagnosis not present

## 2016-01-16 DIAGNOSIS — Z96651 Presence of right artificial knee joint: Secondary | ICD-10-CM | POA: Diagnosis not present

## 2016-01-16 DIAGNOSIS — Z471 Aftercare following joint replacement surgery: Secondary | ICD-10-CM | POA: Diagnosis not present

## 2016-01-16 DIAGNOSIS — K219 Gastro-esophageal reflux disease without esophagitis: Secondary | ICD-10-CM | POA: Diagnosis not present

## 2016-01-16 DIAGNOSIS — K580 Irritable bowel syndrome with diarrhea: Secondary | ICD-10-CM | POA: Diagnosis not present

## 2016-01-16 DIAGNOSIS — M1990 Unspecified osteoarthritis, unspecified site: Secondary | ICD-10-CM | POA: Diagnosis not present

## 2016-01-16 DIAGNOSIS — Z79891 Long term (current) use of opiate analgesic: Secondary | ICD-10-CM | POA: Diagnosis not present

## 2016-01-16 DIAGNOSIS — E782 Mixed hyperlipidemia: Secondary | ICD-10-CM | POA: Diagnosis not present

## 2016-01-16 DIAGNOSIS — I10 Essential (primary) hypertension: Secondary | ICD-10-CM | POA: Diagnosis not present

## 2016-01-16 DIAGNOSIS — J4520 Mild intermittent asthma, uncomplicated: Secondary | ICD-10-CM | POA: Diagnosis not present

## 2016-01-16 DIAGNOSIS — G4730 Sleep apnea, unspecified: Secondary | ICD-10-CM | POA: Diagnosis not present

## 2016-01-21 ENCOUNTER — Other Ambulatory Visit: Payer: Self-pay | Admitting: Family Medicine

## 2016-01-21 DIAGNOSIS — Z471 Aftercare following joint replacement surgery: Secondary | ICD-10-CM | POA: Diagnosis not present

## 2016-01-21 DIAGNOSIS — K580 Irritable bowel syndrome with diarrhea: Secondary | ICD-10-CM | POA: Diagnosis not present

## 2016-01-21 DIAGNOSIS — Z79891 Long term (current) use of opiate analgesic: Secondary | ICD-10-CM | POA: Diagnosis not present

## 2016-01-21 DIAGNOSIS — Z96651 Presence of right artificial knee joint: Secondary | ICD-10-CM | POA: Diagnosis not present

## 2016-01-21 DIAGNOSIS — M1990 Unspecified osteoarthritis, unspecified site: Secondary | ICD-10-CM | POA: Diagnosis not present

## 2016-01-21 DIAGNOSIS — E782 Mixed hyperlipidemia: Secondary | ICD-10-CM | POA: Diagnosis not present

## 2016-01-21 DIAGNOSIS — J4520 Mild intermittent asthma, uncomplicated: Secondary | ICD-10-CM | POA: Diagnosis not present

## 2016-01-21 DIAGNOSIS — K219 Gastro-esophageal reflux disease without esophagitis: Secondary | ICD-10-CM | POA: Diagnosis not present

## 2016-01-21 DIAGNOSIS — N3281 Overactive bladder: Secondary | ICD-10-CM | POA: Diagnosis not present

## 2016-01-21 DIAGNOSIS — I10 Essential (primary) hypertension: Secondary | ICD-10-CM | POA: Diagnosis not present

## 2016-01-21 DIAGNOSIS — G4730 Sleep apnea, unspecified: Secondary | ICD-10-CM | POA: Diagnosis not present

## 2016-01-22 DIAGNOSIS — M1990 Unspecified osteoarthritis, unspecified site: Secondary | ICD-10-CM | POA: Diagnosis not present

## 2016-01-22 DIAGNOSIS — N3281 Overactive bladder: Secondary | ICD-10-CM | POA: Diagnosis not present

## 2016-01-22 DIAGNOSIS — I10 Essential (primary) hypertension: Secondary | ICD-10-CM | POA: Diagnosis not present

## 2016-01-22 DIAGNOSIS — Z471 Aftercare following joint replacement surgery: Secondary | ICD-10-CM | POA: Diagnosis not present

## 2016-01-22 DIAGNOSIS — K580 Irritable bowel syndrome with diarrhea: Secondary | ICD-10-CM | POA: Diagnosis not present

## 2016-01-22 DIAGNOSIS — J4520 Mild intermittent asthma, uncomplicated: Secondary | ICD-10-CM | POA: Diagnosis not present

## 2016-01-22 DIAGNOSIS — E782 Mixed hyperlipidemia: Secondary | ICD-10-CM | POA: Diagnosis not present

## 2016-01-22 DIAGNOSIS — Z79891 Long term (current) use of opiate analgesic: Secondary | ICD-10-CM | POA: Diagnosis not present

## 2016-01-22 DIAGNOSIS — Z96651 Presence of right artificial knee joint: Secondary | ICD-10-CM | POA: Diagnosis not present

## 2016-01-22 DIAGNOSIS — K219 Gastro-esophageal reflux disease without esophagitis: Secondary | ICD-10-CM | POA: Diagnosis not present

## 2016-01-22 DIAGNOSIS — G4730 Sleep apnea, unspecified: Secondary | ICD-10-CM | POA: Diagnosis not present

## 2016-01-23 DIAGNOSIS — S72051A Unspecified fracture of head of right femur, initial encounter for closed fracture: Secondary | ICD-10-CM | POA: Diagnosis not present

## 2016-01-24 ENCOUNTER — Other Ambulatory Visit: Payer: Self-pay | Admitting: Family Medicine

## 2016-01-26 NOTE — Telephone Encounter (Signed)
Last office visit 01/02/2016.  Ok to refill?

## 2016-01-27 NOTE — Telephone Encounter (Signed)
[facility]ad[facility]. 

## 2016-01-30 DIAGNOSIS — R350 Frequency of micturition: Secondary | ICD-10-CM | POA: Diagnosis not present

## 2016-01-30 DIAGNOSIS — N3020 Other chronic cystitis without hematuria: Secondary | ICD-10-CM | POA: Diagnosis not present

## 2016-01-30 DIAGNOSIS — N3946 Mixed incontinence: Secondary | ICD-10-CM | POA: Diagnosis not present

## 2016-02-22 DIAGNOSIS — S72051A Unspecified fracture of head of right femur, initial encounter for closed fracture: Secondary | ICD-10-CM | POA: Diagnosis not present

## 2016-02-26 DIAGNOSIS — R350 Frequency of micturition: Secondary | ICD-10-CM | POA: Diagnosis not present

## 2016-02-26 DIAGNOSIS — N3020 Other chronic cystitis without hematuria: Secondary | ICD-10-CM | POA: Diagnosis not present

## 2016-03-18 ENCOUNTER — Other Ambulatory Visit: Payer: Self-pay | Admitting: Family Medicine

## 2016-03-18 NOTE — Telephone Encounter (Signed)
Last office visit 01/02/2016.  Last refilled 12/30/2015 for #120 with no refills.  Ok to refill?

## 2016-03-24 DIAGNOSIS — S72051A Unspecified fracture of head of right femur, initial encounter for closed fracture: Secondary | ICD-10-CM | POA: Diagnosis not present

## 2016-03-26 DIAGNOSIS — Z961 Presence of intraocular lens: Secondary | ICD-10-CM | POA: Diagnosis not present

## 2016-03-27 DIAGNOSIS — S42291S Other displaced fracture of upper end of right humerus, sequela: Secondary | ICD-10-CM | POA: Diagnosis not present

## 2016-03-28 ENCOUNTER — Other Ambulatory Visit: Payer: Self-pay | Admitting: Family Medicine

## 2016-03-30 DIAGNOSIS — R159 Full incontinence of feces: Secondary | ICD-10-CM | POA: Diagnosis not present

## 2016-03-30 DIAGNOSIS — N329 Bladder disorder, unspecified: Secondary | ICD-10-CM | POA: Diagnosis not present

## 2016-03-30 DIAGNOSIS — N39498 Other specified urinary incontinence: Secondary | ICD-10-CM | POA: Diagnosis not present

## 2016-04-21 ENCOUNTER — Ambulatory Visit (INDEPENDENT_AMBULATORY_CARE_PROVIDER_SITE_OTHER): Payer: Medicare Other | Admitting: Family Medicine

## 2016-04-21 ENCOUNTER — Encounter: Payer: Self-pay | Admitting: Family Medicine

## 2016-04-21 VITALS — BP 144/93 | HR 50 | Temp 98.0°F | Ht 64.0 in | Wt 155.0 lb

## 2016-04-21 DIAGNOSIS — K589 Irritable bowel syndrome without diarrhea: Secondary | ICD-10-CM | POA: Diagnosis not present

## 2016-04-21 DIAGNOSIS — K648 Other hemorrhoids: Secondary | ICD-10-CM | POA: Diagnosis not present

## 2016-04-21 DIAGNOSIS — K644 Residual hemorrhoidal skin tags: Secondary | ICD-10-CM | POA: Insufficient documentation

## 2016-04-21 DIAGNOSIS — I10 Essential (primary) hypertension: Secondary | ICD-10-CM

## 2016-04-21 DIAGNOSIS — L247 Irritant contact dermatitis due to plants, except food: Secondary | ICD-10-CM

## 2016-04-21 MED ORDER — TRIAMCINOLONE ACETONIDE 0.5 % EX CREA: 1.0000 "application " | TOPICAL_CREAM | Freq: Two times a day (BID) | CUTANEOUS | Status: DC

## 2016-04-21 NOTE — Assessment & Plan Note (Signed)
Previously well controlled on  Losartan HCTZ. Restart.

## 2016-04-21 NOTE — Assessment & Plan Note (Signed)
Treat with topical OTC med. If worsening call for further recommendations.

## 2016-04-21 NOTE — Patient Instructions (Addendum)
Apply steroid cream to face twice daily. Use hemorrhoid cream on rectum as needed.  Decrease grease in diet especially when eating out to avoid IBS flare.

## 2016-04-21 NOTE — Assessment & Plan Note (Signed)
Improved with bentyl. Recommended avoiding  Greasy foods and eating out.

## 2016-04-21 NOTE — Progress Notes (Signed)
75 year old female presents for 6 months follow up.  IN last 3 days she has been exposed to posion ivy in past.  Was working outside. Very itchy. No pain.  Using technu.  She is healing from recent right knee replacement. She is followed by [facility], follow up in 05/2016. Doing home PT.  Hypertension:  poor control today in office on losartan HCTZ. Has not take medication today. BP Readings from Last 3 Encounters:  04/21/16 144/93  01/02/16 153/88  12/25/15 126/72  Using medication without problems or lightheadedness: None Chest pain with exertion:none Edema:none Short of breath:none Average home BPs: not checking lately Other issues:      GERD, no longer on protonix. She occ has reflux with above GI issues.  IBS:  occ issues eating out after greasy meals.Using bentyl prn  abdominal pain cramping, no d, no c. Has frequent BMs . Urinary urge incon: Uro, Dr. Jacqlyn Larsen as Mybetriq is not helping. Plans procedure.    Has noted lump at her rectum. Occ itchy and sore. Uses zinc oxide on rectum.. Given frequent soft BMs, wipes a lot, no constipation. Using OTC hemorrhoid cream. Has not made it go away.  Social History /Family History/Past Medical History reviewed and updated if needed.   Review of Systems  All other systems reviewed and are negative.     Objective:  Physical Exam  Constitutional: Vital signs are normal. She appears well-developed and well-nourished. She is cooperative. Non-toxic appearance. She does not appear ill. No distress.  HENT:  Head: Normocephalic.  Right Ear: Hearing, tympanic membrane, external ear and ear canal normal.  Left Ear: Hearing, tympanic membrane, external ear and ear canal normal.  Nose: Nose normal.  Eyes: Conjunctivae, EOM and lids are normal. Pupils are equal, round, and reactive to light. Lids are everted and swept, no foreign bodies found.  Neck: Trachea normal and normal range of motion. Neck supple. Carotid bruit is not  present. No mass and no thyromegaly present.  Cardiovascular: Normal rate, regular rhythm, S1 normal, S2 normal, normal heart sounds and intact distal pulses. Exam reveals no gallop.  No murmur heard. Pulmonary/Chest: Effort normal and breath sounds normal. No respiratory distress. She has no wheezes. She has no rhonchi. She has no rales.  Abdominal: Soft. Normal appearance and bowel sounds are normal. She exhibits no distension, no fluid wave, no abdominal bruit and no mass. There is no hepatosplenomegaly. There is no tenderness. There is no rebound, no guarding and no CVA tenderness. No hernia. Grade 1 external hemorrhoids, no redness, no pain.  Lymphadenopathy:   She has no cervical adenopathy.   She has no axillary adenopathy.  Neurological: She is alert. She has normal strength. No cranial nerve deficit or sensory deficit.  Skin: multiples SKs on chest, toros and arm Skin is warm, dry and intact. Erythema and excoriations on left forehead, no vesicles, no tenderness.  Psychiatric: Her speech is normal and behavior is normal. Judgment normal. Her mood appears not anxious. Cognition and memory are normal. She does not exhibit a depressed mood.

## 2016-04-21 NOTE — Progress Notes (Signed)
Pre visit review using our clinic review tool, if applicable. No additional management support is needed unless otherwise documented below in the visit note. 

## 2016-04-21 NOTE — Assessment & Plan Note (Signed)
No resp distress. Treat with topical steroid.

## 2016-04-23 ENCOUNTER — Other Ambulatory Visit: Payer: Self-pay | Admitting: Family Medicine

## 2016-04-30 DIAGNOSIS — N39498 Other specified urinary incontinence: Secondary | ICD-10-CM | POA: Diagnosis not present

## 2016-05-12 DIAGNOSIS — B351 Tinea unguium: Secondary | ICD-10-CM | POA: Diagnosis not present

## 2016-05-12 DIAGNOSIS — M79675 Pain in left toe(s): Secondary | ICD-10-CM | POA: Diagnosis not present

## 2016-05-12 DIAGNOSIS — M79674 Pain in right toe(s): Secondary | ICD-10-CM | POA: Diagnosis not present

## 2016-05-12 DIAGNOSIS — M76821 Posterior tibial tendinitis, right leg: Secondary | ICD-10-CM | POA: Diagnosis not present

## 2016-05-27 DIAGNOSIS — M1711 Unilateral primary osteoarthritis, right knee: Secondary | ICD-10-CM | POA: Diagnosis not present

## 2016-05-27 DIAGNOSIS — M25511 Pain in right shoulder: Secondary | ICD-10-CM | POA: Diagnosis not present

## 2016-06-01 DIAGNOSIS — R159 Full incontinence of feces: Secondary | ICD-10-CM | POA: Diagnosis not present

## 2016-06-03 ENCOUNTER — Encounter: Payer: Self-pay | Admitting: Allergy & Immunology

## 2016-06-03 ENCOUNTER — Ambulatory Visit (INDEPENDENT_AMBULATORY_CARE_PROVIDER_SITE_OTHER): Payer: Medicare Other | Admitting: Allergy & Immunology

## 2016-06-03 ENCOUNTER — Encounter (INDEPENDENT_AMBULATORY_CARE_PROVIDER_SITE_OTHER): Payer: Self-pay

## 2016-06-03 VITALS — BP 110/76 | HR 80 | Resp 16 | Ht 62.5 in | Wt 155.0 lb

## 2016-06-03 DIAGNOSIS — J310 Chronic rhinitis: Secondary | ICD-10-CM

## 2016-06-03 DIAGNOSIS — T781XXD Other adverse food reactions, not elsewhere classified, subsequent encounter: Secondary | ICD-10-CM | POA: Diagnosis not present

## 2016-06-03 DIAGNOSIS — J4520 Mild intermittent asthma, uncomplicated: Secondary | ICD-10-CM | POA: Diagnosis not present

## 2016-06-03 DIAGNOSIS — T782XXD Anaphylactic shock, unspecified, subsequent encounter: Secondary | ICD-10-CM | POA: Diagnosis not present

## 2016-06-03 DIAGNOSIS — T63481D Toxic effect of venom of other arthropod, accidental (unintentional), subsequent encounter: Secondary | ICD-10-CM | POA: Diagnosis not present

## 2016-06-03 MED ORDER — ALBUTEROL SULFATE HFA 108 (90 BASE) MCG/ACT IN AERS: INHALATION_SPRAY | RESPIRATORY_TRACT | 1 refills | Status: DC

## 2016-06-03 MED ORDER — MONTELUKAST SODIUM 10 MG PO TABS: 10.0000 mg | ORAL_TABLET | Freq: Every day | ORAL | 5 refills | Status: DC
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
## 2016-06-03 NOTE — Patient Instructions (Addendum)
1. Mild intermittent asthma, uncomplicated - Lung testing looked good today.  - Continue montelukast 10mg  daily.  - Continue albuterol 2-4 puffs every 4-6 hours as needed.  2. Adverse food reactions (multiple foods) - Continue to avoid all of your foods.  - Make sure your EpiPen is up to date.  3. Chronic rhinitis - Continue with nasal saline as needed. - Continue with loratadine (Claritin) 10mg  twice daily.  - There is no need for a nasal steroid spray at this time.   4. Anaphylaxis with hives - Continue with loratadine (Claritin) 10mg  twice daily.  - Carry your EpiPen with you at all times.  5. Insect sting allergy, current reaction, accidental or unintentional, subsequent encounter - We will re-order the venom immunotherapy.   6. Return to clinic in four weeks or earlier for restarting the stinging insect allergy shots.   It was a pleasure to meet you today.

## 2016-06-08 ENCOUNTER — Other Ambulatory Visit: Payer: Self-pay | Admitting: *Deleted

## 2016-06-08 MED ORDER — PANTOPRAZOLE SODIUM 40 MG PO TBEC: 40.0000 mg | DELAYED_RELEASE_TABLET | Freq: Every day | ORAL | 1 refills | Status: DC

## 2016-06-08 MED ORDER — NIACIN ER (ANTIHYPERLIPIDEMIC) 500 MG PO TBCR: 500.0000 mg | EXTENDED_RELEASE_TABLET | Freq: Every day | ORAL | 1 refills | Status: DC

## 2016-06-16 ENCOUNTER — Encounter: Payer: Self-pay | Admitting: *Deleted

## 2016-06-23 ENCOUNTER — Other Ambulatory Visit: Payer: Self-pay

## 2016-06-23 MED ORDER — EPINEPHRINE 0.3 MG/0.3ML IJ SOAJ: 0.3000 mg | Freq: Once | INTRAMUSCULAR | 1 refills | Status: AC

## 2016-07-06 ENCOUNTER — Other Ambulatory Visit: Payer: Self-pay | Admitting: *Deleted

## 2016-07-06 ENCOUNTER — Ambulatory Visit: Payer: Medicare Other

## 2016-07-06 ENCOUNTER — Ambulatory Visit (INDEPENDENT_AMBULATORY_CARE_PROVIDER_SITE_OTHER): Payer: Medicare Other | Admitting: Allergy & Immunology

## 2016-07-06 ENCOUNTER — Encounter (INDEPENDENT_AMBULATORY_CARE_PROVIDER_SITE_OTHER): Payer: Self-pay

## 2016-07-06 ENCOUNTER — Encounter: Payer: Self-pay | Admitting: Allergy & Immunology

## 2016-07-06 VITALS — BP 120/80 | HR 65 | Temp 98.3°F | Resp 15

## 2016-07-06 DIAGNOSIS — T781XXD Other adverse food reactions, not elsewhere classified, subsequent encounter: Secondary | ICD-10-CM

## 2016-07-06 DIAGNOSIS — T63481D Toxic effect of venom of other arthropod, accidental (unintentional), subsequent encounter: Secondary | ICD-10-CM | POA: Diagnosis not present

## 2016-07-06 DIAGNOSIS — J4520 Mild intermittent asthma, uncomplicated: Secondary | ICD-10-CM

## 2016-07-06 DIAGNOSIS — T782XXD Anaphylactic shock, unspecified, subsequent encounter: Secondary | ICD-10-CM

## 2016-07-06 DIAGNOSIS — J310 Chronic rhinitis: Secondary | ICD-10-CM

## 2016-07-06 MED ORDER — MONTELUKAST SODIUM 10 MG PO TABS: 10.0000 mg | ORAL_TABLET | Freq: Every day | ORAL | 1 refills | Status: DC

## 2016-07-06 MED ORDER — LORATADINE 10 MG PO TABS: 10.0000 mg | ORAL_TABLET | Freq: Two times a day (BID) | ORAL | 1 refills | Status: DC | PRN

## 2016-07-06 NOTE — Patient Instructions (Addendum)
1. Mild intermittent asthma, uncomplicated - Continue montelukast 10mg  daily.  - Continue albuterol 2-4 puffs every 4-6 hours as needed. - We will send in three month prescriptions.  2. Adverse food reactions (multiple foods) - Continue to avoid all of your foods.  - Make sure your EpiPen is up to date.   3. Chronic rhinitis - Continue with nasal saline as needed. - Continue with loratadine (Claritin) 10mg  twice daily.  - There is no need for a nasal steroid spray at this time.   4. Anaphylaxis with hives - Continue with loratadine (Claritin) 10mg  twice daily.  - Carry your EpiPen with you at all times.  5. Insect sting allergy, current reaction, accidental or unintentional, subsequent encounter - You got your first shot today for venom.  - Continue on current schedule.   6. Return in about 3 months (around 10/05/2016).  Please inform us of any Emergency Department visits, hospitalizations, or changes in symptoms. Call us before going to the ED for breathing or allergy symptoms since we might be able to fit you in for a sick visit. Feel free to contact us anytime with any questions, problems, or concerns.  It was a pleasure to see you again today!   Websites that have reliable patient information: 1. American Academy of Asthma, Allergy, and Immunology: www.aaaai.org 2. Food Allergy Research and Education (FARE): foodallergy.org 3. Mothers of Asthmatics: http://www.asthmacommunitynetwork.org 4. American College of Allergy, Asthma, and Immunology: www.acaai.org
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
## 2016-07-06 NOTE — Telephone Encounter (Signed)
Last office visit 04/21/2016.  Last refilled 04/23/2016 for #120 with 1 refill.  Ok to refill?

## 2016-07-07 MED ORDER — DICYCLOMINE HCL 10 MG PO CAPS: ORAL_CAPSULE | ORAL | 1 refills | Status: DC

## 2016-07-09 DIAGNOSIS — K622 Anal prolapse: Secondary | ICD-10-CM | POA: Diagnosis not present

## 2016-07-10 MED ORDER — DICYCLOMINE HCL 10 MG PO CAPS: ORAL_CAPSULE | ORAL | 3 refills | Status: DC

## 2016-07-10 NOTE — Addendum Note (Signed)
Addended by: Carter Kitten on: 07/10/2016 12:13 PM   Modules accepted: Orders

## 2016-07-10 NOTE — Telephone Encounter (Signed)
Received fax from [facility] requesting 90 day supply.  Ok to send in 90 day supply per Dr. Diona Browner.

## 2016-07-17 ENCOUNTER — Ambulatory Visit (INDEPENDENT_AMBULATORY_CARE_PROVIDER_SITE_OTHER): Payer: Medicare Other | Admitting: *Deleted

## 2016-07-17 DIAGNOSIS — T63481D Toxic effect of venom of other arthropod, accidental (unintentional), subsequent encounter: Secondary | ICD-10-CM

## 2016-07-28 ENCOUNTER — Ambulatory Visit (INDEPENDENT_AMBULATORY_CARE_PROVIDER_SITE_OTHER): Payer: Medicare Other | Admitting: Orthopaedic Surgery

## 2016-07-29 ENCOUNTER — Other Ambulatory Visit: Payer: Self-pay | Admitting: Family Medicine

## 2016-07-29 NOTE — Telephone Encounter (Signed)
Last office visit 04/21/2016.  Last refilled 04/21/2016 for 15 g with no refills.  Ok to refill?

## 2016-07-31 ENCOUNTER — Ambulatory Visit (INDEPENDENT_AMBULATORY_CARE_PROVIDER_SITE_OTHER): Payer: Medicare Other

## 2016-07-31 DIAGNOSIS — T63481D Toxic effect of venom of other arthropod, accidental (unintentional), subsequent encounter: Secondary | ICD-10-CM

## 2016-08-07 ENCOUNTER — Ambulatory Visit (INDEPENDENT_AMBULATORY_CARE_PROVIDER_SITE_OTHER): Payer: Medicare Other

## 2016-08-07 DIAGNOSIS — T63481D Toxic effect of venom of other arthropod, accidental (unintentional), subsequent encounter: Secondary | ICD-10-CM

## 2016-08-14 ENCOUNTER — Ambulatory Visit (INDEPENDENT_AMBULATORY_CARE_PROVIDER_SITE_OTHER): Payer: Medicare Other

## 2016-08-14 DIAGNOSIS — T63481D Toxic effect of venom of other arthropod, accidental (unintentional), subsequent encounter: Secondary | ICD-10-CM

## 2016-08-18 ENCOUNTER — Encounter (INDEPENDENT_AMBULATORY_CARE_PROVIDER_SITE_OTHER): Payer: Self-pay | Admitting: Orthopaedic Surgery

## 2016-08-18 ENCOUNTER — Ambulatory Visit (INDEPENDENT_AMBULATORY_CARE_PROVIDER_SITE_OTHER): Payer: Medicare Other | Admitting: Orthopaedic Surgery

## 2016-08-18 ENCOUNTER — Ambulatory Visit (INDEPENDENT_AMBULATORY_CARE_PROVIDER_SITE_OTHER): Payer: Medicare Other

## 2016-08-18 ENCOUNTER — Telehealth: Payer: Self-pay

## 2016-08-18 VITALS — BP 149/82 | HR 54 | Ht 63.0 in | Wt 160.0 lb

## 2016-08-18 DIAGNOSIS — M7501 Adhesive capsulitis of right shoulder: Secondary | ICD-10-CM | POA: Diagnosis not present

## 2016-08-18 DIAGNOSIS — Z96651 Presence of right artificial knee joint: Secondary | ICD-10-CM | POA: Diagnosis not present

## 2016-08-18 DIAGNOSIS — S42291D Other displaced fracture of upper end of right humerus, subsequent encounter for fracture with routine healing: Secondary | ICD-10-CM | POA: Diagnosis not present

## 2016-08-18 MED ORDER — METHYLPREDNISOLONE ACETATE 40 MG/ML IJ SUSP
40.0000 mg | INTRAMUSCULAR | Status: AC | PRN
  Administered 2016-08-18: 40 mg via INTRA_ARTICULAR

## 2016-08-18 MED ORDER — LIDOCAINE HCL 1 % IJ SOLN
3.0000 mL | INTRAMUSCULAR | Status: AC | PRN
  Administered 2016-08-18: 3 mL

## 2016-08-18 MED ORDER — BUPIVACAINE HCL 0.25 % IJ SOLN
4.0000 mL | INTRAMUSCULAR | Status: AC | PRN
  Administered 2016-08-18: 4 mL via INTRA_ARTICULAR

## 2016-08-18 NOTE — Telephone Encounter (Signed)
completed

## 2016-08-18 NOTE — Telephone Encounter (Signed)
Chea with [facility] left v/m checking the status of a request faxed on 07/14/16. Re; to question 21 on [place] [facility] request for PA; question needs to be answered or put NA for not available . Needs to be initialed by physician and dated by the correction. Then fax to [phone].  Copy of form placed in Dr Rometta Emery in box.
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
## 2016-08-18 NOTE — Telephone Encounter (Signed)
Completed form faxed to [employer] at [phone].

## 2016-08-18 NOTE — Patient Instructions (Signed)
Work on range of motion exercises as discussed.  Call if any problems.

## 2016-08-21 ENCOUNTER — Ambulatory Visit (INDEPENDENT_AMBULATORY_CARE_PROVIDER_SITE_OTHER): Payer: Medicare Other

## 2016-08-21 DIAGNOSIS — T63481D Toxic effect of venom of other arthropod, accidental (unintentional), subsequent encounter: Secondary | ICD-10-CM | POA: Diagnosis not present

## 2016-08-28 ENCOUNTER — Ambulatory Visit (INDEPENDENT_AMBULATORY_CARE_PROVIDER_SITE_OTHER): Payer: Medicare Other | Admitting: *Deleted

## 2016-08-28 DIAGNOSIS — T63481D Toxic effect of venom of other arthropod, accidental (unintentional), subsequent encounter: Secondary | ICD-10-CM | POA: Diagnosis not present

## 2016-09-02 ENCOUNTER — Ambulatory Visit (INDEPENDENT_AMBULATORY_CARE_PROVIDER_SITE_OTHER): Payer: Medicare Other | Admitting: *Deleted

## 2016-09-02 DIAGNOSIS — M79675 Pain in left toe(s): Secondary | ICD-10-CM | POA: Diagnosis not present

## 2016-09-02 DIAGNOSIS — T63481D Toxic effect of venom of other arthropod, accidental (unintentional), subsequent encounter: Secondary | ICD-10-CM | POA: Diagnosis not present

## 2016-09-02 DIAGNOSIS — B351 Tinea unguium: Secondary | ICD-10-CM | POA: Diagnosis not present

## 2016-09-02 DIAGNOSIS — M79674 Pain in right toe(s): Secondary | ICD-10-CM | POA: Diagnosis not present

## 2016-09-14 ENCOUNTER — Ambulatory Visit (INDEPENDENT_AMBULATORY_CARE_PROVIDER_SITE_OTHER): Payer: Medicare Other | Admitting: *Deleted

## 2016-09-14 DIAGNOSIS — T63481D Toxic effect of venom of other arthropod, accidental (unintentional), subsequent encounter: Secondary | ICD-10-CM

## 2016-09-25 ENCOUNTER — Ambulatory Visit (INDEPENDENT_AMBULATORY_CARE_PROVIDER_SITE_OTHER): Payer: Medicare Other | Admitting: *Deleted

## 2016-09-25 DIAGNOSIS — T63481D Toxic effect of venom of other arthropod, accidental (unintentional), subsequent encounter: Secondary | ICD-10-CM

## 2016-09-29 ENCOUNTER — Encounter (INDEPENDENT_AMBULATORY_CARE_PROVIDER_SITE_OTHER): Payer: Self-pay | Admitting: Orthopaedic Surgery

## 2016-09-29 ENCOUNTER — Ambulatory Visit (INDEPENDENT_AMBULATORY_CARE_PROVIDER_SITE_OTHER): Payer: Medicare Other | Admitting: Orthopaedic Surgery

## 2016-09-29 ENCOUNTER — Ambulatory Visit (INDEPENDENT_AMBULATORY_CARE_PROVIDER_SITE_OTHER): Payer: Medicare Other

## 2016-09-29 VITALS — BP 160/75 | HR 48 | Ht 63.0 in | Wt 160.0 lb

## 2016-09-29 DIAGNOSIS — M25511 Pain in right shoulder: Secondary | ICD-10-CM | POA: Diagnosis not present

## 2016-09-29 DIAGNOSIS — T63481D Toxic effect of venom of other arthropod, accidental (unintentional), subsequent encounter: Secondary | ICD-10-CM | POA: Diagnosis not present

## 2016-09-29 DIAGNOSIS — M7501 Adhesive capsulitis of right shoulder: Secondary | ICD-10-CM | POA: Diagnosis not present

## 2016-09-29 DIAGNOSIS — G8929 Other chronic pain: Secondary | ICD-10-CM
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
## 2016-09-30 ENCOUNTER — Other Ambulatory Visit: Payer: Self-pay | Admitting: Family Medicine

## 2016-10-02 ENCOUNTER — Other Ambulatory Visit: Payer: Self-pay | Admitting: Family Medicine

## 2016-10-02 DIAGNOSIS — Z1231 Encounter for screening mammogram for malignant neoplasm of breast: Secondary | ICD-10-CM

## 2016-10-07 ENCOUNTER — Ambulatory Visit (INDEPENDENT_AMBULATORY_CARE_PROVIDER_SITE_OTHER): Payer: Medicare Other | Admitting: *Deleted

## 2016-10-07 DIAGNOSIS — T63481D Toxic effect of venom of other arthropod, accidental (unintentional), subsequent encounter: Secondary | ICD-10-CM

## 2016-10-08 ENCOUNTER — Ambulatory Visit: Payer: Medicare Other | Admitting: Allergy & Immunology

## 2016-10-13 ENCOUNTER — Telehealth: Payer: Self-pay | Admitting: Family Medicine

## 2016-10-13 DIAGNOSIS — I10 Essential (primary) hypertension: Secondary | ICD-10-CM

## 2016-10-13 DIAGNOSIS — E7800 Pure hypercholesterolemia, unspecified: Secondary | ICD-10-CM

## 2016-10-13 DIAGNOSIS — R7303 Prediabetes: Secondary | ICD-10-CM

## 2016-10-13 NOTE — Telephone Encounter (Signed)
-----   Message from Marchia Bond sent at 10/08/2016 10:14 AM EST ----- Regarding: Cpx labs Mon 1/8, need orders. Thanks :-) Please order  future cpx labs for pt's upcoming lab appt. Thanks Aniceto Boss

## 2016-10-16 ENCOUNTER — Ambulatory Visit (INDEPENDENT_AMBULATORY_CARE_PROVIDER_SITE_OTHER): Payer: Medicare Other

## 2016-10-16 VITALS — BP 138/80 | HR 50 | Temp 97.9°F | Ht 63.0 in | Wt 155.2 lb

## 2016-10-16 DIAGNOSIS — E7800 Pure hypercholesterolemia, unspecified: Secondary | ICD-10-CM | POA: Diagnosis not present

## 2016-10-16 DIAGNOSIS — T63481D Toxic effect of venom of other arthropod, accidental (unintentional), subsequent encounter: Secondary | ICD-10-CM | POA: Diagnosis not present

## 2016-10-16 DIAGNOSIS — R7303 Prediabetes: Secondary | ICD-10-CM

## 2016-10-16 DIAGNOSIS — Z0000 Encounter for general adult medical examination without abnormal findings: Secondary | ICD-10-CM | POA: Diagnosis not present

## 2016-10-16 LAB — COMPREHENSIVE METABOLIC PANEL
ALT: 10 U/L (ref 0–35)
AST: 18 U/L (ref 0–37)
Albumin: 4.1 g/dL (ref 3.5–5.2)
Alkaline Phosphatase: 65 U/L (ref 39–117)
BILIRUBIN TOTAL: 0.3 mg/dL (ref 0.2–1.2)
BUN: 15 mg/dL (ref 6–23)
CO2: 28 meq/L (ref 19–32)
CREATININE: 0.76 mg/dL (ref 0.40–1.20)
Calcium: 9.3 mg/dL (ref 8.4–10.5)
Chloride: 104 mEq/L (ref 96–112)
GFR: 78.54 mL/min (ref >60.00)
GLUCOSE: 105 mg/dL — AB (ref 70–99)
Potassium: 4 mEq/L (ref 3.5–5.1)
Sodium: 139 mEq/L (ref 135–145)
TOTAL PROTEIN: 7.1 g/dL (ref 6.0–8.3)

## 2016-10-16 LAB — LIPID PANEL
Cholesterol: 205 mg/dL — ABNORMAL HIGH (ref 0–200)
HDL: 54.8 mg/dL (ref >39.00)
LDL Cholesterol: 130 mg/dL — ABNORMAL HIGH (ref 0–99)
NONHDL: 150.49
Total CHOL/HDL Ratio: 4
Triglycerides: 101 mg/dL (ref 0.0–149.0)
VLDL: 20.2 mg/dL (ref 0.0–40.0)

## 2016-10-16 LAB — HEMOGLOBIN A1C: HEMOGLOBIN A1C: 5.5 % (ref 4.6–6.5)
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
## 2016-10-16 NOTE — Progress Notes (Signed)
Pre visit review using our clinic review tool, if applicable. No additional management support is needed unless otherwise documented below in the visit note. 

## 2016-10-16 NOTE — Progress Notes (Signed)
I reviewed health advisor's note, was available for consultation, and agree with documentation and plan.  Rexford Prevo, MD [facility] [place]  

## 2016-10-16 NOTE — Patient Instructions (Signed)
[name] , Thank you for taking time to come for your Medicare Wellness Visit. I appreciate your ongoing commitment to your health goals. Please review the following plan we discussed and let me know if I can assist you in the future.   These are the goals we discussed: Goals    . Increase physical activity          Starting 10/16/2016, I will continue to exercise at least 30 min 4-5 days per week.        This is a list of the screening recommended for you and due dates:  Health Maintenance  Topic Date Due  . Tetanus Vaccine  10/12/2020  . Flu Shot  Addressed  . DEXA scan (bone density measurement)  Completed  . Shingles Vaccine  Completed  . Pneumonia vaccines  Completed   Preventive Care for Adults  A healthy lifestyle and preventive care can promote health and wellness. Preventive health guidelines for adults include the following key practices.  . A routine yearly physical is a good way to check with your health care provider about your health and preventive screening. It is a chance to share any concerns and updates on your health and to receive a thorough exam.  . Visit your dentist for a routine exam and preventive care every 6 months. Brush your teeth twice a day and floss once a day. Good oral hygiene prevents tooth decay and gum disease.  . The frequency of eye exams is based on your age, health, family medical history, use  of contact lenses, and other factors. Follow your health care provider's ecommendations for frequency of eye exams.  . Eat a healthy diet. Foods like vegetables, fruits, whole grains, low-fat dairy products, and lean protein foods contain the nutrients you need without too many calories. Decrease your intake of foods high in solid fats, added sugars, and salt. Eat the right amount of calories for you. Get information about a proper diet from your health care provider, if necessary.  . Regular physical exercise is one of the most important things you  can do for your health. Most adults should get at least 150 minutes of moderate-intensity exercise (any activity that increases your heart rate and causes you to sweat) each week. In addition, most adults need muscle-strengthening exercises on 2 or more days a week.  Silver Sneakers may be a benefit available to you. To determine eligibility, you may visit the website: www.silversneakers.com or contact program at 617-822-6371 Mon-Fri between 8AM-8PM.   . Maintain a healthy weight. The body mass index (BMI) is a screening tool to identify possible weight problems. It provides an estimate of body fat based on height and weight. Your health care provider can find your BMI and can help you achieve or maintain a healthy weight.   For adults 20 years and older: ? A BMI below 18.5 is considered underweight. ? A BMI of 18.5 to 24.9 is normal. ? A BMI of 25 to 29.9 is considered overweight. ? A BMI of 30 and above is considered obese.   . Maintain normal blood lipids and cholesterol levels by exercising and minimizing your intake of saturated fat. Eat a balanced diet with plenty of fruit and vegetables. Blood tests for lipids and cholesterol should begin at age 29 and be repeated every 5 years. If your lipid or cholesterol levels are high, you are over 50, or you are at high risk for heart disease, you may need your cholesterol levels checked  more frequently. Ongoing high lipid and cholesterol levels should be treated with medicines if diet and exercise are not working.  . If you smoke, find out from your health care provider how to quit. If you do not use tobacco, please do not start.  . If you choose to drink alcohol, please do not consume more than 2 drinks per day. One drink is considered to be 12 ounces (355 mL) of beer, 5 ounces (148 mL) of wine, or 1.5 ounces (44 mL) of liquor.  . If you are 52-37 years old, ask your health care provider if you should take aspirin to prevent strokes.  . Use  sunscreen. Apply sunscreen liberally and repeatedly throughout the day. You should seek shade when your shadow is shorter than you. Protect yourself by wearing long sleeves, pants, a wide-brimmed hat, and sunglasses year round, whenever you are outdoors.  . Once a month, do a whole body skin exam, using a mirror to look at the skin on your back. Tell your health care provider of new moles, moles that have irregular borders, moles that are larger than a pencil eraser, or moles that have changed in shape or color.

## 2016-10-16 NOTE — Progress Notes (Signed)
PCP notes:   Health maintenance:  Flu vaccine - per pt, completed in Sept 2017  Abnormal screenings:   Hearing - failed Fall risk - hx of multiple falls with injury  Patient concerns:   None  Nurse concerns:  Pt's HR is slow and irregular when pulse was assessed. Pt stated she does need to get under the care of a cardiologist.  Pt also wants to discuss a rheumatology referral with PCP.   Next PCP appt:   10/22/16 @ 1200

## 2016-10-19 ENCOUNTER — Other Ambulatory Visit: Payer: Medicare Other

## 2016-10-20 ENCOUNTER — Ambulatory Visit: Payer: Medicare Other

## 2016-10-22 ENCOUNTER — Ambulatory Visit (INDEPENDENT_AMBULATORY_CARE_PROVIDER_SITE_OTHER): Payer: Medicare Other | Admitting: Family Medicine

## 2016-10-22 ENCOUNTER — Encounter: Payer: Self-pay | Admitting: Family Medicine

## 2016-10-22 VITALS — BP 133/76 | HR 60 | Temp 97.8°F | Ht 63.0 in | Wt 155.8 lb

## 2016-10-22 DIAGNOSIS — R7303 Prediabetes: Secondary | ICD-10-CM

## 2016-10-22 DIAGNOSIS — E7800 Pure hypercholesterolemia, unspecified: Secondary | ICD-10-CM

## 2016-10-22 DIAGNOSIS — I10 Essential (primary) hypertension: Secondary | ICD-10-CM | POA: Diagnosis not present

## 2016-10-22 DIAGNOSIS — D62 Acute posthemorrhagic anemia: Secondary | ICD-10-CM

## 2016-10-22 DIAGNOSIS — E890 Postprocedural hypothyroidism: Secondary | ICD-10-CM

## 2016-10-22 DIAGNOSIS — R52 Pain, unspecified: Secondary | ICD-10-CM | POA: Diagnosis not present

## 2016-10-22 DIAGNOSIS — K580 Irritable bowel syndrome with diarrhea: Secondary | ICD-10-CM

## 2016-10-22 LAB — FERRITIN: Ferritin: 78.6 ng/mL (ref 10.0–291.0)

## 2016-10-22 LAB — IBC PANEL
IRON: 84 ug/dL (ref 42–145)
Saturation Ratios: 22.3 % (ref 20.0–50.0)
TRANSFERRIN: 269 mg/dL (ref 212.0–360.0)

## 2016-10-22 LAB — CBC WITH DIFFERENTIAL/PLATELET
BASOS PCT: 0.4 % (ref 0.0–3.0)
Basophils Absolute: 0 10*3/uL (ref 0.0–0.1)
EOS ABS: 0.1 10*3/uL (ref 0.0–0.7)
Eosinophils Relative: 1.2 % (ref 0.0–5.0)
HCT: 38.5 % (ref 36.0–46.0)
HEMOGLOBIN: 13 g/dL (ref 12.0–15.0)
LYMPHS ABS: 3.2 10*3/uL (ref 0.7–4.0)
Lymphocytes Relative: 40 % (ref 12.0–46.0)
MCHC: 33.9 g/dL (ref 30.0–36.0)
MCV: 91.8 fl (ref 78.0–100.0)
MONO ABS: 0.7 10*3/uL (ref 0.1–1.0)
Monocytes Relative: 9.1 % (ref 3.0–12.0)
NEUTROS ABS: 3.9 10*3/uL (ref 1.4–7.7)
NEUTROS PCT: 49.3 % (ref 43.0–77.0)
Platelets: 249 10*3/uL (ref 150.0–400.0)
RBC: 4.19 Mil/uL (ref 3.87–5.11)
RDW: 13.6 % (ref 11.5–15.5)
WBC: 8 10*3/uL (ref 4.0–10.5)

## 2016-10-22 LAB — T4, FREE: FREE T4: 0.79 ng/dL (ref 0.60–1.60)

## 2016-10-22 LAB — TSH: TSH: 3.11 u[IU]/mL (ref 0.35–4.50)

## 2016-10-22 LAB — SEDIMENTATION RATE: Sed Rate: 18 mm/hr (ref 0–30)

## 2016-10-22 LAB — T3, FREE: T3, Free: 2.6 pg/mL (ref 2.3–4.2)

## 2016-10-22 LAB — C-REACTIVE PROTEIN: CRP: 0.2 mg/dL — ABNORMAL LOW (ref 0.5–20.0)

## 2016-10-22 MED ORDER — ATORVASTATIN CALCIUM 40 MG PO TABS: 40.0000 mg | ORAL_TABLET | Freq: Every day | ORAL | 11 refills | Status: DC

## 2016-10-22 NOTE — Assessment & Plan Note (Signed)
Due for re-eval hg.

## 2016-10-22 NOTE — Progress Notes (Signed)
Subjective:    Patient ID: Sherri Murray, female    DOB: 14-Jul-1941, 76 y.o.   MRN: [id number]  HPI   The patient saw Candis Musa, LPN for medicare wellness. Note reviewed in detail and important notes copied below. Health maintenance: Flu vaccine - per pt, completed in Sept 2017  Abnormal screenings:  Hearing - failed, right ear Fall risk - hx of multiple falls with injury  Patient concerns:  None  Nurse concerns:  Pt's HR is slow and irregular when pulse was assessed. Pt stated she does need to get under the care of a cardiologist.  Pt also wants to discuss a rheumatology referral with PCP.    TODAY: She continues to have body pain all over.  She  Has aches and pains all over. Arms, legs and back. Fingers and knuckles.  no issue in ankles. Also feels muscles sore.  Hard to get up and down.  No redness , no swelling in joints.  Sister has fibromyalgia.   Right knee feels better since TKR.  Plantar fasciitis improved.  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 She is followed by Dr. Lorin Mercy.   HTN: Well controlled on  Losartan HCTZ BP Readings from Last 3 Encounters:  10/22/16 133/76  10/16/16 138/80  09/29/16 (!) 160/75   Using medication without problems or lightheadedness:  none Chest pain with exertion:None Edema:none Short of breath:None Average home BPs: Other issues: Diastolic dysfunction  Elevated Cholesterol: AHA risk cal: 23% risk of CVD in next 10 years.  Needs moderate to high intenstiy statin. Lab Results  Component Value Date   CHOL 205 (H) 10/16/2016   HDL 54.80 10/16/2016   LDLCALC 130 (H) 10/16/2016   LDLDIRECT 136.1 11/02/2013   TRIG 101.0 10/16/2016   CHOLHDL 4 10/16/2016  Using medications without problems: Muscle aches:  Diet compliance: moderate Exercise:none Other complaints:  Prediabetes:  Lab Results  Component Value Date   HGBA1C 5.5 10/16/2016   No  GERD.   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 Not sure why. Lab Results  Component Value Date   TSH 5.59 (H) 12/07/2014   Hx of anemia: on iron  no blood in stool   Due for re-eval.  Social History /Family History/Past Medical History reviewed and updated if needed.  Review of Systems Has issues with diarrhea, frequent at time.. Caused by typical foods.    Objective:   Physical Exam  Constitutional: Vital signs are normal. She appears well-developed and well-nourished. She is cooperative.  Non-toxic appearance. She does not appear ill. No distress.  HENT:  Head: Normocephalic.  Right Ear: Hearing, tympanic membrane, external ear and ear canal normal.  Left Ear: Hearing, tympanic membrane, external ear and ear canal normal.  Nose: Nose normal.  Eyes: Conjunctivae, EOM and lids are normal. Pupils are equal, round, and reactive to light. Lids are everted and swept, no foreign bodies found.  Neck: Trachea normal and normal range of motion. Neck supple. Carotid bruit is not present. No thyroid mass and no thyromegaly present.  Cardiovascular: Normal rate, regular rhythm, S1 normal, S2 normal, normal heart sounds and intact distal pulses.  Exam reveals no gallop.   No murmur heard. Pulmonary/Chest: Effort normal and breath sounds normal. No respiratory distress. She has no wheezes. She has no rhonchi. She has no rales.  Abdominal: Soft. Normal appearance and bowel sounds are normal. She exhibits no distension, no fluid wave, no abdominal bruit and no mass.  There is no hepatosplenomegaly. There is no tenderness. There is no rebound, no guarding and no CVA tenderness. No hernia.  Musculoskeletal:  OA change in DIp and PIP joints.. No swelling or redness in MCPs. No swelling in bilateral small joints like wrists, ankles feet, elbows.   neg trigger points for fibromyalgia.  Lymphadenopathy:    She has no cervical adenopathy.    She has no axillary adenopathy.  Neurological: She is alert.  She has normal strength. No cranial nerve deficit or sensory deficit.  Skin: Skin is warm, dry and intact. No rash noted.  Psychiatric: Her speech is normal and behavior is normal. Judgment normal. Her mood appears not anxious. Cognition and memory are normal. She does not exhibit a depressed mood.          Assessment & Plan:

## 2016-10-22 NOTE — Progress Notes (Signed)
Pre visit review using our clinic review tool, if applicable. No additional management support is needed unless otherwise documented below in the visit note. 

## 2016-10-22 NOTE — Assessment & Plan Note (Addendum)
Will begin rhuem lab work up.  No trigger points for fibromyalgia.  Areas of pain and deformity in hands most consistent with osteoarthritis.

## 2016-10-22 NOTE — Assessment & Plan Note (Signed)
Due for re-eval. 

## 2016-10-22 NOTE — Assessment & Plan Note (Signed)
Well controlled. Continue current medication.  

## 2016-10-22 NOTE — Assessment & Plan Note (Signed)
Stable control. Encouraged exercise, weight loss, healthy eating habits.  

## 2016-10-22 NOTE — Assessment & Plan Note (Signed)
Follow up wit GI as recommended.

## 2016-10-22 NOTE — Assessment & Plan Note (Signed)
Start atorvastatin 40 mg daily.  Return for re-check in 3 months.

## 2016-10-22 NOTE — Patient Instructions (Addendum)
Consider weaning off protonix and rantidine as no reflux issues.  Can discuss with Dr, Carlean Purl when you follow up. Follow up with Dr. Carlean Purl for GI issues Stop at lab on way out. Decrease cholesterol in diet.

## 2016-10-23 ENCOUNTER — Encounter: Payer: Self-pay | Admitting: *Deleted

## 2016-10-23 LAB — RHEUMATOID FACTOR

## 2016-10-23 LAB — CYCLIC CITRUL PEPTIDE ANTIBODY, IGG

## 2016-10-23 LAB — ANA: Anti Nuclear Antibody(ANA): NEGATIVE

## 2016-10-23 NOTE — Progress Notes (Unsigned)
Pre visit review using our clinic review tool, if applicable. No additional management support is needed unless otherwise documented below in the visit note. 

## 2016-10-26 ENCOUNTER — Ambulatory Visit (INDEPENDENT_AMBULATORY_CARE_PROVIDER_SITE_OTHER): Payer: Medicare Other | Admitting: *Deleted

## 2016-10-26 DIAGNOSIS — T63481D Toxic effect of venom of other arthropod, accidental (unintentional), subsequent encounter: Secondary | ICD-10-CM

## 2016-10-27 ENCOUNTER — Telehealth: Payer: Self-pay | Admitting: Family Medicine

## 2016-10-27 DIAGNOSIS — M2550 Pain in unspecified joint: Secondary | ICD-10-CM

## 2016-10-27 DIAGNOSIS — G8929 Other chronic pain: Secondary | ICD-10-CM

## 2016-10-27 MED ORDER — MELOXICAM 15 MG PO TABS: 15.0000 mg | ORAL_TABLET | Freq: Every day | ORAL | 2 refills | Status: DC

## 2016-10-27 NOTE — Telephone Encounter (Signed)
Pt returned your call, please call back.

## 2016-10-27 NOTE — Telephone Encounter (Signed)
Lab results discussed with patient.  See result notes from labs on 10/22/2016.

## 2016-10-27 NOTE — Telephone Encounter (Signed)
-----   Message from Carter Kitten, [place] sent at 10/27/2016  1:41 PM EST ----- Mrs. Kimmer notified as instructed by telephone.  She is agreeable to trying Meloxicam or Celebrex.  She also would like the referral to rheumatology.

## 2016-10-27 NOTE — Telephone Encounter (Signed)
I did not call, you?

## 2016-10-27 NOTE — Telephone Encounter (Signed)
Referral snet.  Sent in rx for meloxicam.

## 2016-11-06 ENCOUNTER — Ambulatory Visit (INDEPENDENT_AMBULATORY_CARE_PROVIDER_SITE_OTHER): Payer: Medicare Other

## 2016-11-06 DIAGNOSIS — T63481D Toxic effect of venom of other arthropod, accidental (unintentional), subsequent encounter: Secondary | ICD-10-CM | POA: Diagnosis not present

## 2016-11-10 ENCOUNTER — Ambulatory Visit: Payer: Medicare Other | Admitting: Family Medicine

## 2016-11-12 ENCOUNTER — Ambulatory Visit: Payer: Medicare Other | Admitting: Allergy & Immunology

## 2016-11-17 ENCOUNTER — Ambulatory Visit (INDEPENDENT_AMBULATORY_CARE_PROVIDER_SITE_OTHER): Payer: Medicare Other | Admitting: Family Medicine

## 2016-11-17 ENCOUNTER — Encounter: Payer: Self-pay | Admitting: Family Medicine

## 2016-11-17 VITALS — BP 122/78 | HR 48 | Temp 97.6°F | Ht 63.0 in | Wt 157.0 lb

## 2016-11-17 DIAGNOSIS — R001 Bradycardia, unspecified: Secondary | ICD-10-CM | POA: Insufficient documentation

## 2016-11-17 DIAGNOSIS — R0789 Other chest pain: Secondary | ICD-10-CM | POA: Diagnosis not present

## 2016-11-17 DIAGNOSIS — I499 Cardiac arrhythmia, unspecified: Secondary | ICD-10-CM | POA: Diagnosis not present

## 2016-11-17 NOTE — Progress Notes (Signed)
Subjective:    Patient ID: Sherri Murray, female    DOB: [birth year], 76 y.o.   MRN: [id number]  HPI    76 year old female presents for follow up 2 weeks.   At last appt on 10/22/2016: We discussed  Her total body pain.   Sent for rheum lab eval.. Neg work up so far. Crp, anticcp,ana, sed rate nml, neg rf.  Cbc, thyroid nml  No trigger points for fibromyalgia.  Ares of pain  And deformitymost consistent with OA.  Started on meloxicam 15 mg daily. Has helped with pain overall.  She was also referred to a rheumatologist. Appt; 12/2016 Has follow up with ortho for right shoulder issues 12/09/2016.   Today she returns with new onset irregular heartbeat.   She noted off and on for years she has noted irregular heart rate.  In last year.Marland Kitchen Has noted every few days she feels it is irregular and slow.  Lasts few minutes. Occ twinges of pain in chest, last seconds to minutes.  Ongoing in last year.  Feels like she notes more when she overwork. Occ when at rest as well as when she No chest pain or SOB when walking.  She feels like she is under more stress lately with shoulder pain.    Told leaky valve or vein in heart years ago on prob list.. Mild mitral regurgitation. Believes she had heart monitor in past.. Not sure results. 10 years ago stress test and ECHO with previous PCP.   Review of Systems  Constitutional: Negative for fatigue and fever.  HENT: Negative for ear pain.   Eyes: Negative for pain.  Respiratory: Negative for chest tightness and shortness of breath.   Cardiovascular: Negative for chest pain, palpitations and leg swelling.  Gastrointestinal: Negative for abdominal pain.  Genitourinary: Negative for dysuria.    eye twitching  Crackeling in head?  Blood pressure 122/78, pulse (!) 48, temperature 97.6 F (36.4 C), temperature source Oral, height 5\' 3"  (1.6 m), weight 157 lb (71.2 kg).    Objective:   Physical Exam  Constitutional: Vital signs are normal. She  appears well-developed and well-nourished. She is cooperative.  Non-toxic appearance. She does not appear ill. No distress.  HENT:  Head: Normocephalic.  Right Ear: Hearing, tympanic membrane, external ear and ear canal normal. Tympanic membrane is not erythematous, not retracted and not bulging.  Left Ear: Hearing, tympanic membrane, external ear and ear canal normal. Tympanic membrane is not erythematous, not retracted and not bulging.  Nose: No mucosal edema or rhinorrhea. Right sinus exhibits no maxillary sinus tenderness and no frontal sinus tenderness. Left sinus exhibits no maxillary sinus tenderness and no frontal sinus tenderness.  Mouth/Throat: Uvula is midline, oropharynx is clear and moist and mucous membranes are normal.  Eyes: Conjunctivae, EOM and lids are normal. Pupils are equal, round, and reactive to light. Lids are everted and swept, no foreign bodies found.  Neck: Trachea normal and normal range of motion. Neck supple. Carotid bruit is not present. No thyroid mass and no thyromegaly present.  Cardiovascular: Normal rate, regular rhythm, S1 normal, S2 normal, normal heart sounds, intact distal pulses and normal pulses.  Exam reveals no gallop and no friction rub.   No murmur heard. Pulmonary/Chest: Effort normal and breath sounds normal. No tachypnea. No respiratory distress. She has no decreased breath sounds. She has no wheezes. She has no rhonchi. She has no rales.  Abdominal: Soft. Normal appearance and bowel sounds are normal. There is no  tenderness.  Neurological: She is alert.  Skin: Skin is warm, dry and intact. No rash noted.  Psychiatric: Her speech is normal and behavior is normal. Judgment and thought content normal. Her mood appears not anxious. Cognition and memory are normal. She does not exhibit a depressed mood.    HEART RATE IS STEADY and REGULAR.      Assessment & Plan:

## 2016-11-17 NOTE — Patient Instructions (Signed)
Please stop at the front desk or lab to set up referral or to have labs drawn.  

## 2016-11-17 NOTE — Assessment & Plan Note (Signed)
Atypical symptoms. EKG unable to obtain true reading today.. No sure why interference.  Pt currently asymptomatic and no red flags.  Nml EKG 11/2015  Will refer for stress test to eval further.. May need ECHO and Holter monitor versus cards referral.

## 2016-11-17 NOTE — Progress Notes (Signed)
Pre visit review using our clinic review tool, if applicable. No additional management support is needed unless otherwise documented below in the visit note. 

## 2016-11-18 ENCOUNTER — Ambulatory Visit (INDEPENDENT_AMBULATORY_CARE_PROVIDER_SITE_OTHER): Payer: Medicare Other | Admitting: Allergy & Immunology

## 2016-11-18 ENCOUNTER — Encounter: Payer: Self-pay | Admitting: Allergy & Immunology

## 2016-11-18 VITALS — BP 120/76 | HR 60 | Temp 97.9°F | Resp 16 | Ht 63.0 in | Wt 157.6 lb

## 2016-11-18 DIAGNOSIS — T63481D Toxic effect of venom of other arthropod, accidental (unintentional), subsequent encounter: Secondary | ICD-10-CM

## 2016-11-18 DIAGNOSIS — J4520 Mild intermittent asthma, uncomplicated: Secondary | ICD-10-CM | POA: Diagnosis not present

## 2016-11-18 DIAGNOSIS — T781XXD Other adverse food reactions, not elsewhere classified, subsequent encounter: Secondary | ICD-10-CM

## 2016-11-18 DIAGNOSIS — J3089 Other allergic rhinitis: Secondary | ICD-10-CM

## 2016-11-18 NOTE — Progress Notes (Signed)
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 Mild intermittent asthma, uncomplicated - Continue montelukast 10mg  daily.   - Continue albuterol 2-4 puffs every 4-6 hours as needed.  2. Adverse food reactions (multiple foods) - Continue to avoid all of your foods.  - Make sure your EpiPen is up to date.   3. Chronic rhinitis - Continue with nasal saline as needed.  - Continue with loratadine (Claritin) 10mg  twice daily.  - We will send in a prescription for Nasonex one spray per nostril once daily.   4. Anaphylaxis with hives - Continue with loratadine (Claritin) 10mg  twice daily.  - Carry your EpiPen with you at all times.  5. 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   - Continue on current schedule.   6. Return in about 6 months (around 05/18/2017).   Subjective:   Sherri Murray is a 76 y.o. female presenting today for follow up of  Chief Complaint  Patient presents with  . Asthma    Follow up   . Allergic Rhinitis     Chronic - Follow up    Sherri Murray has a history of the following: Patient Active Problem List   Diagnosis Date Noted  . Bradycardia 11/17/2016  . Adhesive capsulitis of right shoulder 08/18/2016  . External hemorrhoids without complication [id number]  . Osteoarthritis of right knee 12/25/2015  . Status post total right knee replacement 12/11/2015  . Advanced directives, counseling/discussion 10/18/2015  . Total body pain 08/13/2015  . Diastolic dysfunction 0000000  . Mild mitral regurgitation 12/20/2014  . Atypical chest pain 12/06/2014    . Benign essential HTN 10/19/2014  . High cholesterol 05/01/2014  . Prediabetes 05/01/2014  . Allergic rhinitis 09/28/2013  . Mild intermittent asthma 09/28/2013  . Ischemic colitis, hx of 09/28/2013  . Hx of migraines 09/28/2013  . GERD (gastroesophageal reflux disease) 09/28/2013  . 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 Varicose veins of lower extremities with other complications [id number]  . Osteoporosis screening 04/21/2013  . Incontinence of urine   . IBS (irritable bowel syndrome) 02/27/2012    History obtained from: chart review and patient.  Sherri Murray was referred by Eliezer Lofts, MD.     Sherri Murray is a 76 y.o. female presenting for a follow up visit. She was last seen in September 2017. At that time, she was doing well. We continued her on Singulair as well as albuterol for her asthma. She has multiple food allergies and hasn't anaphylaxis management plan in place. We continued her on nasal saline and Claritin for her chronic rhinitis. We also reinitiated her insect immunotherapy (mixed vespid).  Sherri Murray asthma has been well controlled. She has not required rescue medication, experienced nocturnal awakenings due to lower respiratory symptoms, nor have activities of daily living been limited. She remains on Singulair 10 mg daily. She also has [employer] which she uses as needed. Overall, she feels like her symptoms are. Apparently there are some issues with the prescriptions at the last visit and she was not getting her three-month supply. She has had no ER visits or prednisone courses since last visit.  She does have a history  of chronic rhinitis. She uses nasal saline as needed as well as Claritin 10 mg twice daily. At the last visit, she was not on a nasal spray however, she reports using Nasonex one spray per nostril daily at this time. The spring and summer months seem to be the worst time of the year for her.  She remains on her mixed vespid immunotherapy which  she receives once monthly. She has no reactions to the shot. She continues to carry her EpiPen. Her EpiPen at home is definitely the adult dose, however on her discharge paperwork from the last visit the EpiPen is listed as a Paramedic. She has had no accidental encounters with stinging insects since last visit.  Otherwise, there have been no changes to the past medical history, surgical history, family history, or social history. She lives on a [place] acre property in an HUD-provided home. She did recently have her before meals and heating duct work fixed to make a more efficient. She does have some problems affording her medications but so far has been able to get everything she needs. She has an income of approximately $800 per month for Social Security.  Otherwise, there have been no changes to her past medical history, surgical history, family history, or social history.    Review of Systems: a 14-point review of systems is pertinent for what is mentioned in HPI.  Otherwise, all other systems were negative. Constitutional: negative other than that listed in the HPI Eyes: negative other than that listed in the HPI Ears, nose, mouth, throat, and face: negative other than that listed in the HPI Respiratory: negative other than that listed in the HPI Cardiovascular: negative other than that listed in the HPI Gastrointestinal: negative other than that listed in the HPI Genitourinary: negative other than that listed in the HPI Integument: negative other than that listed in the HPI Hematologic: negative other than that listed in the HPI Musculoskeletal: negative other than that listed in the HPI Neurological: negative other than that listed in the HPI Allergy/Immunologic: negative other than that listed in the HPI    Objective:   Blood pressure 120/76, pulse 60, temperature 97.9 F (36.6 C), temperature source Oral, resp. rate 16, height 5\' 3"  (1.6 m), weight 157 lb 9.6 oz (71.5 kg), SpO2 96  %. Body mass index is 27.92 kg/m.   Physical Exam:  General: Alert, interactive, in no acute distress. Talkative and cooperative with the exam. Pleasant.  Eyes: No conjunctival injection present on the right, No conjunctival injection present on the left, PERRL bilaterally, No discharge on the right and No discharge on the left Ears: Right TM pearly gray with normal light reflex, Left TM pearly gray with normal light reflex, Right TM intact without perforation and Left TM intact without perforation.  Nose/Throat: External nose within normal limits and septum midline, turbinates edematous with clear discharge, post-pharynx mildly erythematous with cobblestoning in the posterior oropharynx. Tonsils 2+ without exudates Neck: Supple without thyromegaly. Lungs: Clear to auscultation without wheezing, rhonchi or rales. No increased work of breathing. CV: Normal S1/S2, no murmurs. Capillary refill <2 seconds.  Skin: Warm and dry, without lesions or rashes. Neuro:   Grossly intact. No focal deficits appreciated. Responsive to questions.   Diagnostic studies:  Spirometry: results normal (FEV1: 1.65/83%, FVC: 1.95/74%, FEV1/FVC: 85%).    Spirometry consistent with normal pattern. Compared to the last spirometry, her values are stable.   Allergy Studies: None     Salvatore Marvel, MD FAAAAI [facility]

## 2016-11-18 NOTE — Patient Instructions (Addendum)
1. Mild intermittent asthma, uncomplicated - Continue montelukast 10mg  daily.   - Continue albuterol 2-4 puffs every 4-6 hours as needed.  2. Adverse food reactions (multiple foods) - Continue to avoid all of your foods.  - Make sure your EpiPen is up to date.   3. Chronic rhinitis - Continue with nasal saline as needed.  - Continue with loratadine (Claritin) 10mg  twice daily.  - We will send in a prescription for Nasonex one spray per nostril once daily.   4. Anaphylaxis with hives - Continue with loratadine (Claritin) 10mg  twice daily.  - Carry your EpiPen with you at all times.  5. 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   - Continue on current schedule.   6. Return in about 6 months (around 05/18/2017).  Please inform us of any Emergency Department visits, hospitalizations, or changes in symptoms. Call us before going to the ED for breathing or allergy symptoms since we might be able to fit you in for a sick visit. Feel free to contact us anytime with any questions, problems, or concerns.  It was a pleasure to see you again today!   Websites that have reliable patient information: 1. American Academy of Asthma, Allergy, and Immunology: www.aaaai.org 2. Food Allergy Research and Education (FARE): foodallergy.org 3. Mothers of Asthmatics: http://www.asthmacommunitynetwork.org 4. American College of Allergy, Asthma, and Immunology: www.acaai.org

## 2016-11-20 ENCOUNTER — Ambulatory Visit: Payer: Medicare Other

## 2016-11-20 ENCOUNTER — Telehealth (HOSPITAL_COMMUNITY): Payer: Self-pay | Admitting: Family Medicine

## 2016-11-20 DIAGNOSIS — R0789 Other chest pain: Secondary | ICD-10-CM

## 2016-11-20 DIAGNOSIS — R001 Bradycardia, unspecified: Secondary | ICD-10-CM

## 2016-11-20 DIAGNOSIS — I340 Nonrheumatic mitral (valve) insufficiency: Secondary | ICD-10-CM

## 2016-11-20 NOTE — Telephone Encounter (Signed)
Called pt and spoke with her about moving her myoview appt up by 15 mins. It was originally schedule at 7:30am in [place] [facility] and I switched her to [place] [facility] at 7:15. Pt voiced understanding and agreed to change in time.

## 2016-11-23 ENCOUNTER — Telehealth (HOSPITAL_COMMUNITY): Payer: Self-pay | Admitting: *Deleted

## 2016-11-23 NOTE — Telephone Encounter (Signed)
Patient given detailed instructions per Myocardial Perfusion Study Information Sheet for the test on 11/25/16. Patient notified to arrive 15 minutes early and that it is imperative to arrive on time for appointment to keep from having the test rescheduled.  If you need to cancel or reschedule your appointment, please call the office within 24 hours of your appointment. Failure to do so may result in a cancellation of your appointment, and a $50 no show fee. Patient verbalized understanding. Kemesha Mosey Jacqueline    

## 2016-11-25 ENCOUNTER — Ambulatory Visit (INDEPENDENT_AMBULATORY_CARE_PROVIDER_SITE_OTHER): Payer: Medicare Other

## 2016-11-25 ENCOUNTER — Ambulatory Visit (HOSPITAL_COMMUNITY): Payer: Medicare Other | Attending: Cardiology

## 2016-11-25 DIAGNOSIS — R001 Bradycardia, unspecified: Secondary | ICD-10-CM

## 2016-11-25 DIAGNOSIS — I499 Cardiac arrhythmia, unspecified: Secondary | ICD-10-CM | POA: Diagnosis not present

## 2016-11-25 DIAGNOSIS — R0789 Other chest pain: Secondary | ICD-10-CM

## 2016-11-25 DIAGNOSIS — T63481D Toxic effect of venom of other arthropod, accidental (unintentional), subsequent encounter: Secondary | ICD-10-CM

## 2016-11-25 LAB — MYOCARDIAL PERFUSION IMAGING
CHL CUP NUCLEAR SDS: 1
CHL CUP NUCLEAR SRS: 9
CHL CUP RESTING HR STRESS: 48 {beats}/min
CSEPPHR: 77 {beats}/min
LV dias vol: 64 mL (ref 46–106)
LV sys vol: 12 mL
RATE: 0.33
SSS: 10
TID: 0.86

## 2016-11-25 MED ORDER — TECHNETIUM TC 99M TETROFOSMIN IV KIT
10.1000 | PACK | Freq: Once | INTRAVENOUS | Status: AC | PRN
  Administered 2016-11-25: 10.1 via INTRAVENOUS
  Filled 2016-11-25: qty 11

## 2016-11-25 MED ORDER — TECHNETIUM TC 99M TETROFOSMIN IV KIT
32.9000 | PACK | Freq: Once | INTRAVENOUS | Status: AC | PRN
  Administered 2016-11-25: 32.9 via INTRAVENOUS
  Filled 2016-11-25: qty 33

## 2016-11-25 MED ORDER — REGADENOSON 0.4 MG/5ML IV SOLN
0.4000 mg | Freq: Once | INTRAVENOUS | Status: AC
  Administered 2016-11-25: 0.4 mg via INTRAVENOUS

## 2016-11-26 NOTE — Telephone Encounter (Signed)
I don't know what "heart tremors" means but I will put in referral to cards given chest pain and bradycardia.

## 2016-11-26 NOTE — Telephone Encounter (Signed)
-----   Message from Carter Kitten, [place] sent at 11/26/2016 12:55 PM EST ----- Mrs. Hashman notified as instructed by telephone.  She states she was told she had heart tremors when she went for her stress tests.  She would like to move forward with the the referral to the cardiologist to be on the safe side.

## 2016-11-27 NOTE — Telephone Encounter (Signed)
Appt made and patient aware.  

## 2016-12-01 ENCOUNTER — Encounter (INDEPENDENT_AMBULATORY_CARE_PROVIDER_SITE_OTHER): Payer: Self-pay | Admitting: Orthopaedic Surgery

## 2016-12-01 ENCOUNTER — Ambulatory Visit (INDEPENDENT_AMBULATORY_CARE_PROVIDER_SITE_OTHER): Payer: Medicare Other | Admitting: Orthopaedic Surgery

## 2016-12-01 ENCOUNTER — Ambulatory Visit (INDEPENDENT_AMBULATORY_CARE_PROVIDER_SITE_OTHER): Payer: Medicare Other

## 2016-12-01 VITALS — BP 142/75 | HR 49 | Ht 63.0 in | Wt 157.0 lb

## 2016-12-01 DIAGNOSIS — M7501 Adhesive capsulitis of right shoulder: Secondary | ICD-10-CM | POA: Diagnosis not present

## 2016-12-01 DIAGNOSIS — M19111 Post-traumatic osteoarthritis, right shoulder: Secondary | ICD-10-CM
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
## 2016-12-03 ENCOUNTER — Ambulatory Visit (INDEPENDENT_AMBULATORY_CARE_PROVIDER_SITE_OTHER): Payer: Medicare Other

## 2016-12-03 DIAGNOSIS — T63481D Toxic effect of venom of other arthropod, accidental (unintentional), subsequent encounter: Secondary | ICD-10-CM | POA: Diagnosis not present

## 2016-12-03 MED ORDER — ALBUTEROL SULFATE 108 (90 BASE) MCG/ACT IN AEPB: 2.0000 | INHALATION_SPRAY | RESPIRATORY_TRACT | 1 refills | Status: DC

## 2016-12-03 MED ORDER — OLOPATADINE HCL 0.1 % OP SOLN: 1.0000 [drp] | Freq: Two times a day (BID) | OPHTHALMIC | 5 refills | Status: DC | PRN

## 2016-12-03 MED ORDER — MOMETASONE FUROATE 50 MCG/ACT NA SUSP: 2.0000 | Freq: Every day | NASAL | 5 refills | Status: DC

## 2016-12-04 ENCOUNTER — Telehealth (INDEPENDENT_AMBULATORY_CARE_PROVIDER_SITE_OTHER): Payer: Self-pay | Admitting: *Deleted

## 2016-12-04 DIAGNOSIS — M7501 Adhesive capsulitis of right shoulder: Secondary | ICD-10-CM

## 2016-12-04 NOTE — Telephone Encounter (Signed)
Received call today from [place] at [facility] imaging stating pt has a neurostimulator and can NOT ever get an MRI done. States would have to be a CT instead, per [employer] RT. Please advise.  CB# [phone]

## 2016-12-04 NOTE — Telephone Encounter (Signed)
Ok to change to CT scan per Benjiman Core, PA-C

## 2016-12-04 NOTE — Telephone Encounter (Signed)
Order entered for CT per Jeneen Rinks. Thanks.

## 2016-12-07 NOTE — Telephone Encounter (Signed)
noted 

## 2016-12-08 ENCOUNTER — Ambulatory Visit (INDEPENDENT_AMBULATORY_CARE_PROVIDER_SITE_OTHER): Payer: Medicare Other | Admitting: Cardiovascular Disease

## 2016-12-08 ENCOUNTER — Encounter: Payer: Self-pay | Admitting: Cardiovascular Disease

## 2016-12-08 VITALS — BP 138/80 | HR 51 | Ht 63.0 in | Wt 157.5 lb

## 2016-12-08 DIAGNOSIS — E7800 Pure hypercholesterolemia, unspecified: Secondary | ICD-10-CM | POA: Diagnosis not present

## 2016-12-08 DIAGNOSIS — R52 Pain, unspecified: Secondary | ICD-10-CM

## 2016-12-08 DIAGNOSIS — R001 Bradycardia, unspecified: Secondary | ICD-10-CM

## 2016-12-08 DIAGNOSIS — I491 Atrial premature depolarization: Secondary | ICD-10-CM

## 2016-12-08 DIAGNOSIS — I10 Essential (primary) hypertension: Secondary | ICD-10-CM

## 2016-12-08 DIAGNOSIS — R32 Unspecified urinary incontinence: Secondary | ICD-10-CM

## 2016-12-08 DIAGNOSIS — R0789 Other chest pain: Secondary | ICD-10-CM

## 2016-12-08 NOTE — Progress Notes (Signed)
Cardiology Office Note  Date:  12/08/2016   ID:  Sherri Murray, DOB Aug 18, 1941, MRN [id number]  PCP:  Eliezer Lofts, MD   Chief Complaint  Patient presents with  . other    Ref by Dr. Diona Browner for chest pain. Pt. c/o chest pain with pressure in her head. Meds reviewed by the pt. verbally.     HPI:  76 year old female with long hx of palpitations, long hx of chest pain, prior stress test in 2004, 2011, and February 2018, history of hyperlipidemia, bowel and bladder incontinence with bladder stimulator in place, followed by Dr. Jacqlyn Larsen at [facility], who presents by referral from Dr. Diona Browner for evaluation of her "heart tremor", palpitations, chest pain  Patient reports having rare episodes of chest pain, dating back many years We have reviewed previous records from 2011 at which time she was seen by [facility] cardiology. She had stress test in 2004 for similar symptoms, stress test in 2011 for similar symptoms Both stress test showed no ischemia, it was felt her chest pain was atypical in nature  Recent stress test done in [place] for similar symptoms The test showed APCs during lexiscan infusion, one short run of atrial tach (several beats) There was significant artifact from her stimulator noted on the EKG (likely responsible for the "heart tremor ") Perfusion images showed Small fixed anteroseptal defect (breast attenuation artifact)  Normal echo 12/13/2014, results reviewed with her today. Mention of mild to moderate mitral valve regurgitation  Other symptoms include total body pain. Was told by rheumatology that she did not have fibromyalgia Previous rheum workup Crp, anticcp,ana, sed rate nml, neg rf. , Cbc, thyroid nml  Started on meloxicam 15 mg daily. Has helped with pain   Reports that she is always had a slow heartbeat Heart rate today on EKG 51 bpm, asymptomatic She does report irregular heartbeat going back for years, again not for asymptomatic  She denies any chest pain on  exertion Occasional twinges in her chest that last for a few seconds Main complaint on today's visit is her urinary incontinence, bowel incontinence  EKG on today's visit shows normal sinus rhythm with rate 51 bpm no significant ST or T-wave changes, artifact from stimulator  PMH  ory of Acute ischemic colitis ([place]) (02/26/2012); Acute posthemorrhagic anemia; Arthritis; Chronic diarrhea; Chronic foot pain; 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  PSH:    Past Surgical History:  Procedure Laterality Date  . CATARACT EXTRACTION     right  . COLONOSCOPY  02/27/2012   Procedure: COLONOSCOPY;  Surgeon: Gatha Mayer, MD;  Location: [place];  Service: Endoscopy;  Laterality: N/A;  . KNEE ARTHROPLASTY Right 12/11/2015   Procedure: COMPUTER ASSISTED TOTAL KNEE ARTHROPLASTY;  Surgeon: Marybelle Killings, MD;  Location: [place];  Service: Orthopedics;  Laterality: Right;  . liver biopsy  1980   nml  . MYOMECTOMY    . SACRAL NERVE STIMULATOR PLACEMENT    . THYROID SURGERY     for goiter  . TONSILLECTOMY    . TUBAL LIGATION      Current Outpatient Prescriptions  Medication Sig Dispense Refill  . Albuterol Sulfate (PROAIR RESPICLICK) [id number] (90 Base) MCG/ACT AEPB Inhale 2-4 puffs into the lungs See admin instructions. Every 4-6 hours as needed. 1 each 1  . atorvastatin (LIPITOR) 40 MG  tablet Take 1 tablet (40 mg total) by mouth daily. 30 tablet 11  . Biotin 5000 MCG TABS Take 5,000 mcg by mouth daily.    . Calcium Carbonate-Vitamin D (CALCIUM 600/VITAMIN D) 600-400 MG-UNIT per chew tablet Chew 1 tablet by mouth daily.     . Cholecalciferol (D 1000) 1000 UNITS capsule Take 1,000 Units by mouth daily.     Marland Kitchen dicyclomine  (BENTYL) 10 MG capsule TAKE 1 CAPSULE (10 MG TOTAL) BY MOUTH 4 (FOUR) TIMES DAILY - BEFORE MEALS AND AT BEDTIME. 360 capsule 3  . EPINEPHrine (EPIPEN JR 2-PAK) 0.15 MG/0.3ML injection Inject 0.15 mg into the muscle daily as needed for anaphylaxis. Reported on 12/25/2015    . ferrous sulfate 325 (65 FE) MG tablet Take 1 tablet (325 mg total) by mouth 2 (two) times daily with a meal. 60 tablet 2  . Flaxseed, Linseed, (FLAX SEED OIL PO) Take 1 capsule by mouth daily.    Marland Kitchen FOLIC ACID PO Take 1 tablet by mouth daily.    Marland Kitchen ibuprofen (ADVIL,MOTRIN) 200 MG tablet Take 400 mg by mouth 2 (two) times daily.     Marland Kitchen loratadine (CLARITIN) 10 MG tablet Take 1 tablet (10 mg total) by mouth 2 (two) times daily as needed for allergies. 90 tablet 1  . losartan-hydrochlorothiazide (HYZAAR) 50-12.5 MG tablet TAKE 1 TABLET BY MOUTH DAILY. 90 tablet 1  . Melatonin 5 MG TABS Take 2 tablets by mouth at bedtime.    . meloxicam (MOBIC) 15 MG tablet Take 1 tablet (15 mg total) by mouth daily. 30 tablet 2  . mometasone (NASONEX) 50 MCG/ACT nasal spray Place 2 sprays into the nose daily. Two sprays each in each nostril 17 g 5  . montelukast (SINGULAIR) 10 MG tablet Take 1 tablet (10 mg total) by mouth at bedtime. 90 tablet 1  . Multiple Vitamins-Minerals (CENTRUM SILVER) tablet Take 1 tablet by mouth daily.    . naproxen sodium (ANAPROX) 220 MG tablet Take 220 mg by mouth every morning.    . niacin (NIASPAN) 500 MG CR tablet Take 1 tablet (500 mg total) by mouth daily. 90 tablet 1  . olopatadine (PATANOL) 0.1 % ophthalmic solution Place 1 drop into both eyes 2 (two) times daily as needed for allergies. Frequency:PRN   Dosage:0.0     Instructions:  Note:Dose: 0.1 % 5 mL 5  . oxybutynin (DITROPAN-XL) 10 MG 24 hr tablet Take 10 mg by mouth at bedtime.     . pantoprazole (PROTONIX) 40 MG tablet Take 1 tablet (40 mg total) by mouth daily. 90 tablet 1  . ranitidine (ZANTAC) 150 MG tablet TAKE 1 TABLET BY MOUTH TWICE DAILY 60 tablet 5   . triamcinolone cream (KENALOG) 0.5 % APPLY 1 APPLICATION TOPICALLY 2 (TWO) TIMES DAILY. 15 g 0   No current facility-administered medications for this visit.      Allergies:   Apple; Banana; Barium-containing compounds; Bee venom; Celery oil; Iodinated diagnostic agents; Ioxaglate; Other; Strawberry extract; Aspirin; Barium sulfate; Latex; Penicillins; Sulfa antibiotics; and Etodolac   Social History:  The patient  reports that she has never smoked. She has never used smokeless tobacco. She reports that she drinks alcohol. She reports that she does not use drugs.   Family History:   family history includes Breast cancer in her sister; Breast cancer (age of onset: 22) in her sister; Cancer in her paternal aunt; Colon cancer (age of onset: 46) in her mother; Coronary artery disease in her father; Diabetes in her  maternal grandmother; Fibromyalgia in her sister; Heart disease in her father; Hypertension in her father; Prostate cancer in her father; Stroke in her father and mother; Thyroid cancer in her other; Thyroid disease in her sister.    Review of Systems: Review of Systems  Constitutional: Negative.   Respiratory: Negative.   Cardiovascular: Positive for chest pain and palpitations.  Gastrointestinal: Negative.   Genitourinary: Positive for frequency and urgency.  Musculoskeletal: Negative.   Neurological: Negative.   Psychiatric/Behavioral: Negative.   All other systems reviewed and are negative.    PHYSICAL EXAM: VS:  BP 138/80 (BP Location: Right Arm, Patient Position: Sitting, Cuff Size: Normal)   Pulse (!) 51   Ht 5\' 3"  (1.6 m)   Wt 157 lb 8 oz (71.4 kg)   BMI 27.90 kg/m  , BMI Body mass index is 27.9 kg/m. GEN: Well nourished, well developed, in no acute distress  HEENT: normal  Neck: no JVD, carotid bruits, or masses Cardiac: RRR; no murmurs, rubs, or gallops,no edema  Respiratory:  clear to auscultation bilaterally, normal work of breathing GI: soft, nontender,  nondistended, + BS MS: no deformity or atrophy  Skin: warm and dry, no rash Neuro:  Strength and sensation are intact Psych: euthymic mood, full affect    Recent Labs: 10/16/2016: ALT 10; BUN 15; Creatinine, Ser 0.76; Potassium 4.0; Sodium 139 10/22/2016: Hemoglobin 13.0; Platelets 249.0; TSH 3.11    Lipid Panel Lab Results  Component Value Date   CHOL 205 (H) 10/16/2016   HDL 54.80 10/16/2016   LDLCALC 130 (H) 10/16/2016   TRIG 101.0 10/16/2016      Wt Readings from Last 3 Encounters:  12/08/16 157 lb 8 oz (71.4 kg)  12/01/16 157 lb (71.2 kg)  11/25/16 157 lb (71.2 kg)       ASSESSMENT AND PLAN:  Atypical chest pain - Plan: EKG 12-Lead Recent stress test showing no ischemia Long history of chest pain with stress test in 2004 2011 and 2018 No further workup needed at this time  APCs Ectopy noted on recent stress test EKG.  Not very symptomatic per the patient. Unable to treat with beta blockers given her underlying bradycardia. No further workup needed at this time. Ectopy seemed to improve after infusion of lexiscan (in recovery)  Bradycardia - Plan: EKG 12-Lead Long history of heart rate in the 61s dating back to 2011 when she was seen by outside cardiologist. Burnis Medin need to monitor, heart rate 51 on today's visit  Benign essential HTN - Plan: EKG 12-Lead Given her urinary issues, suggested she talk with primary care and Dr. Jacqlyn Larsen about a medication change. Potentially could stop the losartan HCT in place her on losartan alone  High cholesterol Cholesterol is at goal on the current lipid regimen. No changes to the medications were made.  Urinary incontinence, unspecified type Followed by Dr. Jacqlyn Larsen, stimulator in place Still having frequent accidents. Consider holding the HCTZ  Total body pain Treated with meloxicam, etiology unclear. Reports she is otherwise very active   Total encounter time more than 45 minutes  Greater than 50% was spent in counseling and  coordination of care with the patient   Disposition:   F/U  6 months   Orders Placed This Encounter  Procedures  . EKG 12-Lead     Signed, Esmond Plants, M.D., Ph.D. 12/08/2016  [place], [place]

## 2016-12-08 NOTE — Patient Instructions (Addendum)
Medication Instructions:   No medication changes made  Consider stopping the losartan HCTZ (this can cause increase urination)  Consider starting losartan alone (100 mg pill) Discuss with Dr. Edd Arbour:  No new labs needed  Testing/Procedures:  No further testing at this time   I recommend watching educational videos on topics of interest to you at:       [email or web address]  Enter code: HEARTCARE    Follow-Up: It was a pleasure seeing you in the office today. Please call us if you have new issues that need to be addressed before your next appt.  [phone]  Your physician wants you to follow-up in: as needed  If you need a refill on your cardiac medications before your next appointment, please call your pharmacy.

## 2016-12-09 ENCOUNTER — Ambulatory Visit
Admission: RE | Admit: 2016-12-09 | Discharge: 2016-12-09 | Disposition: A | Discharge status: Home / Self Care | Payer: Medicare Other | Source: Ambulatory Visit | Attending: Family Medicine | Admitting: Family Medicine

## 2016-12-09 DIAGNOSIS — Z1231 Encounter for screening mammogram for malignant neoplasm of breast: Secondary | ICD-10-CM | POA: Diagnosis not present

## 2016-12-17 ENCOUNTER — Ambulatory Visit
Admission: RE | Admit: 2016-12-17 | Discharge: 2016-12-17 | Disposition: A | Discharge status: Home / Self Care | Payer: Medicare Other | Source: Ambulatory Visit | Attending: Surgery | Admitting: Surgery

## 2016-12-17 ENCOUNTER — Ambulatory Visit (INDEPENDENT_AMBULATORY_CARE_PROVIDER_SITE_OTHER): Payer: Medicare Other

## 2016-12-17 DIAGNOSIS — T63481D Toxic effect of venom of other arthropod, accidental (unintentional), subsequent encounter: Secondary | ICD-10-CM

## 2016-12-17 DIAGNOSIS — M7501 Adhesive capsulitis of right shoulder: Secondary | ICD-10-CM

## 2016-12-17 DIAGNOSIS — M19011 Primary osteoarthritis, right shoulder: Secondary | ICD-10-CM | POA: Diagnosis not present
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
## 2016-12-23 ENCOUNTER — Ambulatory Visit (INDEPENDENT_AMBULATORY_CARE_PROVIDER_SITE_OTHER): Payer: Medicare Other | Admitting: Rheumatology

## 2016-12-23 ENCOUNTER — Ambulatory Visit (INDEPENDENT_AMBULATORY_CARE_PROVIDER_SITE_OTHER): Payer: Medicare Other

## 2016-12-23 ENCOUNTER — Encounter: Payer: Self-pay | Admitting: Rheumatology

## 2016-12-23 ENCOUNTER — Ambulatory Visit (INDEPENDENT_AMBULATORY_CARE_PROVIDER_SITE_OTHER): Payer: Self-pay

## 2016-12-23 VITALS — BP 155/78 | HR 60 | Resp 12 | Ht 63.0 in | Wt 156.0 lb

## 2016-12-23 DIAGNOSIS — M7501 Adhesive capsulitis of right shoulder: Secondary | ICD-10-CM

## 2016-12-23 DIAGNOSIS — R7303 Prediabetes: Secondary | ICD-10-CM

## 2016-12-23 DIAGNOSIS — M79641 Pain in right hand: Secondary | ICD-10-CM | POA: Diagnosis not present

## 2016-12-23 DIAGNOSIS — E7800 Pure hypercholesterolemia, unspecified: Secondary | ICD-10-CM

## 2016-12-23 DIAGNOSIS — M79642 Pain in left hand: Secondary | ICD-10-CM | POA: Diagnosis not present

## 2016-12-23 DIAGNOSIS — I10 Essential (primary) hypertension: Secondary | ICD-10-CM

## 2016-12-23 DIAGNOSIS — M19111 Post-traumatic osteoarthritis, right shoulder: Secondary | ICD-10-CM | POA: Diagnosis not present

## 2016-12-23 DIAGNOSIS — Z96651 Presence of right artificial knee joint: Secondary | ICD-10-CM

## 2016-12-23 DIAGNOSIS — I5189 Other ill-defined heart diseases: Secondary | ICD-10-CM

## 2016-12-23 DIAGNOSIS — I519 Heart disease, unspecified: Secondary | ICD-10-CM

## 2016-12-23 DIAGNOSIS — R52 Pain, unspecified: Secondary | ICD-10-CM

## 2016-12-24 LAB — CK: CK TOTAL: 74 U/L (ref 7–177)

## 2016-12-24 LAB — URIC ACID: Uric Acid, Serum: 4.2 mg/dL (ref 2.5–7.0)

## 2016-12-24 NOTE — Progress Notes (Signed)
CK normal

## 2016-12-25 ENCOUNTER — Ambulatory Visit (INDEPENDENT_AMBULATORY_CARE_PROVIDER_SITE_OTHER): Payer: Medicare Other | Admitting: *Deleted

## 2016-12-25 DIAGNOSIS — T63441D Toxic effect of venom of bees, accidental (unintentional), subsequent encounter: Secondary | ICD-10-CM

## 2016-12-30 ENCOUNTER — Ambulatory Visit (INDEPENDENT_AMBULATORY_CARE_PROVIDER_SITE_OTHER): Payer: Medicare Other | Admitting: Orthopaedic Surgery

## 2016-12-31 LAB — HLA-B27 ANTIGEN: DNA RESULT: NEGATIVE

## 2016-12-31 NOTE — Progress Notes (Signed)
Labs normal.

## 2017-01-01 ENCOUNTER — Ambulatory Visit (INDEPENDENT_AMBULATORY_CARE_PROVIDER_SITE_OTHER): Payer: Medicare Other

## 2017-01-01 DIAGNOSIS — T63441D Toxic effect of venom of bees, accidental (unintentional), subsequent encounter: Secondary | ICD-10-CM | POA: Diagnosis not present

## 2017-01-11 ENCOUNTER — Ambulatory Visit (INDEPENDENT_AMBULATORY_CARE_PROVIDER_SITE_OTHER): Payer: Medicare Other | Admitting: *Deleted

## 2017-01-11 DIAGNOSIS — T63441D Toxic effect of venom of bees, accidental (unintentional), subsequent encounter: Secondary | ICD-10-CM

## 2017-01-18 NOTE — Progress Notes (Signed)
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 She is also positive family history of fibromyalgia and her sister.  Pain in both hands - she has severe PIP/DIP thickening but not much synovitis she also had right third MCP thickening. All autoimmune workup has been negative. 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 Lorin Mercy. She still has some discomfort and incomplete extension  Post-traumatic osteoarthritis of right shoulder: I reviewed her most recent CT scan of her right shoulder joint which shows severe osteoarthritis. She's having severe discomfort and pain and raising her shoulder and nocturnal pain all sleeping. She'll be followed up by Dr. Lorin Mercy for that.  Adhesive capsulitis of right shoulder  Benign essential HTN  Prediabetes  High cholesterol  Diastolic dysfunction    Orders:    Orders Placed This Encounter  Procedures  . XR Hand 2 View Right  . XR Hand 2 View Left  . HLA-B27 antigen  . Uric acid

## 2017-01-19 ENCOUNTER — Ambulatory Visit (INDEPENDENT_AMBULATORY_CARE_PROVIDER_SITE_OTHER): Payer: Medicare Other | Admitting: *Deleted

## 2017-01-19 ENCOUNTER — Telehealth: Payer: Self-pay | Admitting: Family Medicine

## 2017-01-19 ENCOUNTER — Ambulatory Visit (INDEPENDENT_AMBULATORY_CARE_PROVIDER_SITE_OTHER): Payer: Medicare Other | Admitting: Orthopaedic Surgery

## 2017-01-19 ENCOUNTER — Encounter (INDEPENDENT_AMBULATORY_CARE_PROVIDER_SITE_OTHER): Payer: Self-pay | Admitting: Orthopaedic Surgery

## 2017-01-19 ENCOUNTER — Encounter (INDEPENDENT_AMBULATORY_CARE_PROVIDER_SITE_OTHER): Payer: Self-pay | Admitting: Orthopedic Surgery

## 2017-01-19 VITALS — BP 131/74 | HR 72 | Ht 63.0 in | Wt 155.0 lb

## 2017-01-19 DIAGNOSIS — E7800 Pure hypercholesterolemia, unspecified: Secondary | ICD-10-CM

## 2017-01-19 DIAGNOSIS — M87021 Idiopathic aseptic necrosis of right humerus: Secondary | ICD-10-CM

## 2017-01-19 DIAGNOSIS — T63441D Toxic effect of venom of bees, accidental (unintentional), subsequent encounter: Secondary | ICD-10-CM | POA: Diagnosis not present

## 2017-01-19 DIAGNOSIS — M19111 Post-traumatic osteoarthritis, right shoulder: Secondary | ICD-10-CM

## 2017-01-19 DIAGNOSIS — R7303 Prediabetes: Secondary | ICD-10-CM

## 2017-01-19 NOTE — Telephone Encounter (Signed)
-----   Message from Ellamae Sia sent at 01/04/2017  3:29 PM EDT ----- Regarding: Lab orders for Wednesday, 4.11.18 Lab orders for a 3 month follow up appt.

## 2017-01-20 ENCOUNTER — Other Ambulatory Visit (INDEPENDENT_AMBULATORY_CARE_PROVIDER_SITE_OTHER): Payer: Medicare Other

## 2017-01-20 DIAGNOSIS — E7800 Pure hypercholesterolemia, unspecified: Secondary | ICD-10-CM | POA: Diagnosis not present

## 2017-01-20 LAB — COMPREHENSIVE METABOLIC PANEL
ALK PHOS: 57 U/L (ref 39–117)
ALT: 11 U/L (ref 0–35)
AST: 20 U/L (ref 0–37)
Albumin: 4 g/dL (ref 3.5–5.2)
BILIRUBIN TOTAL: 0.3 mg/dL (ref 0.2–1.2)
BUN: 11 mg/dL (ref 6–23)
CHLORIDE: 108 meq/L (ref 96–112)
CO2: 27 meq/L (ref 19–32)
Calcium: 9.4 mg/dL (ref 8.4–10.5)
Creatinine, Ser: 0.8 mg/dL (ref 0.40–1.20)
GFR: 73.97 mL/min (ref >60.00)
Glucose, Bld: 99 mg/dL (ref 70–99)
POTASSIUM: 4 meq/L (ref 3.5–5.1)
SODIUM: 141 meq/L (ref 135–145)
TOTAL PROTEIN: 7 g/dL (ref 6.0–8.3)

## 2017-01-20 LAB — LIPID PANEL
CHOL/HDL RATIO: 3
Cholesterol: 151 mg/dL (ref 0–200)
HDL: 55.6 mg/dL (ref >39.00)
LDL CALC: 78 mg/dL (ref 0–99)
NONHDL: 95.12
Triglycerides: 86 mg/dL (ref 0.0–149.0)
VLDL: 17.2 mg/dL (ref 0.0–40.0)

## 2017-01-22 ENCOUNTER — Other Ambulatory Visit (INDEPENDENT_AMBULATORY_CARE_PROVIDER_SITE_OTHER): Payer: Self-pay | Admitting: Orthopaedic Surgery

## 2017-01-22 DIAGNOSIS — M19011 Primary osteoarthritis, right shoulder: Secondary | ICD-10-CM

## 2017-01-25 DIAGNOSIS — M87021 Idiopathic aseptic necrosis of right humerus: Secondary | ICD-10-CM | POA: Insufficient documentation
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
## 2017-01-26 ENCOUNTER — Encounter: Payer: Self-pay | Admitting: Family Medicine

## 2017-01-26 ENCOUNTER — Ambulatory Visit (INDEPENDENT_AMBULATORY_CARE_PROVIDER_SITE_OTHER): Payer: Medicare Other | Admitting: Family Medicine

## 2017-01-26 VITALS — BP 140/80 | HR 46 | Temp 97.7°F | Ht 63.0 in | Wt 156.8 lb

## 2017-01-26 DIAGNOSIS — E7800 Pure hypercholesterolemia, unspecified: Secondary | ICD-10-CM | POA: Diagnosis not present

## 2017-01-26 DIAGNOSIS — R52 Pain, unspecified: Secondary | ICD-10-CM

## 2017-01-26 DIAGNOSIS — M7501 Adhesive capsulitis of right shoulder: Secondary | ICD-10-CM

## 2017-01-26 DIAGNOSIS — M87021 Idiopathic aseptic necrosis of right humerus: Secondary | ICD-10-CM

## 2017-01-26 DIAGNOSIS — I10 Essential (primary) hypertension: Secondary | ICD-10-CM

## 2017-01-26 MED ORDER — LOSARTAN POTASSIUM 50 MG PO TABS: 50.0000 mg | ORAL_TABLET | Freq: Every day | ORAL | 11 refills | Status: DC

## 2017-01-26 NOTE — Progress Notes (Signed)
Subjective:    Patient ID: Sherri Murray, female    DOB: Feb 17, 1941, 76 y.o.   MRN: [id number]  HPI  76 year old female presents for follow up.  She had stopped all med in last week to see if felt better.Marland Kitchen No change so plans to restart.  Since last OV she had low risk myocardial stress test for atypical chest pain, palpitations.  Ectopy noted. NMl ECHO  Referred to cardiologist for further eval.  12/08/2016 OV with Dr. Rockey Situ reviewed.   Recommended change to losartan  To avoid SE of HCTZ.  Saw Dr. Estanislado Pandy for total body pain on 12/23/2016. Note reviewed in detail. 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 Severe PIP/DIP thickening but not much synovitis she also had right third MCP thickening. All autoimmune workup has been negative.  Adhesive capsulitis and post traumatic  severe OA followed by Dr. Lorin Mercy.  Plans total shoulder replacement 4/27. Followed by rehab.  Cholesterol much better control on statin.. Will restart.  Hypertension:    BP Readings from Last 3 Encounters:  01/26/17 140/80  01/19/17 131/74  12/23/16 (!) 155/78  Using medication without problems or lightheadedness:  Chest pain with exertion: Edema: Short of breath: Average home BPs: Other issues:  Review of Systems  Constitutional: Positive for fatigue. Negative for fever.  HENT: Negative for ear pain.   Eyes: Negative for pain.  Respiratory: Negative for chest tightness and shortness of breath.   Cardiovascular: Negative for chest pain, palpitations and leg swelling.  Gastrointestinal: Negative for abdominal pain.  Genitourinary: Negative for dysuria.       Objective:   Physical Exam  Constitutional: Vital signs are normal. She appears well-developed and well-nourished. She is cooperative.  Non-toxic appearance. She does not appear ill. No distress.  HENT:  Head: Normocephalic.  Right Ear: Hearing, tympanic membrane, external ear and ear canal normal.  Left Ear: Hearing,  tympanic membrane, external ear and ear canal normal.  Nose: Nose normal.  Eyes: Conjunctivae, EOM and lids are normal. Pupils are equal, round, and reactive to light. Lids are everted and swept, no foreign bodies found.  Neck: Trachea normal and normal range of motion. Neck supple. Carotid bruit is not present. No thyroid mass and no thyromegaly present.  Cardiovascular: Normal rate, regular rhythm, S1 normal, S2 normal, normal heart sounds and intact distal pulses.  Exam reveals no gallop.   No murmur heard. Pulmonary/Chest: Effort normal and breath sounds normal. No respiratory distress. She has no wheezes. She has no rhonchi. She has no rales.  Abdominal: Soft. Normal appearance and bowel sounds are normal. She exhibits no distension, no fluid wave, no abdominal bruit and no mass. There is no hepatosplenomegaly. There is no tenderness. There is no rebound, no guarding and no CVA tenderness. No hernia.  Musculoskeletal:       Right shoulder: She exhibits decreased range of motion, tenderness and bony tenderness. She exhibits no swelling.  Decreased ROM in right shoulder OA change in DIp and PIP joints.. No swelling or redness in MCPs. No swelling in bilateral small joints like wrists, ankles feet, elbows.   neg trigger points for fibromyalgia.  Lymphadenopathy:    She has no cervical adenopathy.    She has no axillary adenopathy.  Neurological: She is alert. She has normal strength. No cranial nerve deficit or sensory deficit.  Skin: Skin is warm, dry and intact. No rash noted.  Psychiatric: Her speech is normal and behavior is normal. Judgment normal. Her mood appears  not anxious. Cognition and memory are normal. She does not exhibit a depressed mood.          Assessment & Plan:

## 2017-01-26 NOTE — Assessment & Plan Note (Signed)
Good control, restart statin.

## 2017-01-26 NOTE — Progress Notes (Signed)
Pre visit review using our clinic review tool, if applicable. No additional management support is needed unless otherwise documented below in the visit note. 

## 2017-01-26 NOTE — Assessment & Plan Note (Signed)
Upcoming shoulder replacement with Dr. Lorin Mercy.

## 2017-01-26 NOTE — Assessment & Plan Note (Signed)
Upcoming shoulder replacement.

## 2017-01-26 NOTE — Patient Instructions (Addendum)
Stop losartan HCTZ and change to losartan alone. Follow BP at home.. Call if it is > 140/90 consistently after 1-2 weeks  Restart cholesterol medication.

## 2017-01-26 NOTE — Assessment & Plan Note (Signed)
Pt to reascess pain level after shoulder replacement.  Likely from OA and possible underlying myofacial pain syndrome.  Encouraged avoidance of long term narcotics.  Pt will follow up for re-eval in 3 months.

## 2017-01-26 NOTE — Assessment & Plan Note (Signed)
Change to losartan alone to avoid increased urinary frequency from HCTZ. Pt to follow BP at home and call with BP measurements in 1 week.

## 2017-01-27 ENCOUNTER — Inpatient Hospital Stay (INDEPENDENT_AMBULATORY_CARE_PROVIDER_SITE_OTHER): Payer: Self-pay

## 2017-01-27 ENCOUNTER — Ambulatory Visit (INDEPENDENT_AMBULATORY_CARE_PROVIDER_SITE_OTHER): Payer: Medicare Other | Admitting: Rheumatology

## 2017-01-27 ENCOUNTER — Ambulatory Visit (INDEPENDENT_AMBULATORY_CARE_PROVIDER_SITE_OTHER): Payer: Medicare Other

## 2017-01-27 ENCOUNTER — Inpatient Hospital Stay (INDEPENDENT_AMBULATORY_CARE_PROVIDER_SITE_OTHER): Payer: Medicare Other

## 2017-01-27 ENCOUNTER — Ambulatory Visit: Payer: Medicare Other | Admitting: Rheumatology

## 2017-01-27 DIAGNOSIS — M79641 Pain in right hand: Secondary | ICD-10-CM | POA: Diagnosis not present

## 2017-01-27 DIAGNOSIS — T63441D Toxic effect of venom of bees, accidental (unintentional), subsequent encounter: Secondary | ICD-10-CM

## 2017-01-27 DIAGNOSIS — M79642 Pain in left hand: Secondary | ICD-10-CM | POA: Diagnosis not present

## 2017-01-27 DIAGNOSIS — M19041 Primary osteoarthritis, right hand: Secondary | ICD-10-CM | POA: Insufficient documentation

## 2017-01-27 DIAGNOSIS — M797 Fibromyalgia: Secondary | ICD-10-CM | POA: Insufficient documentation

## 2017-01-27 DIAGNOSIS — M19042 Primary osteoarthritis, left hand: Secondary | ICD-10-CM | POA: Insufficient documentation
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
## 2017-02-01 ENCOUNTER — Encounter (HOSPITAL_COMMUNITY)
Admission: RE | Admit: 2017-02-01 | Discharge: 2017-02-01 | Disposition: A | Discharge status: Home / Self Care | Payer: Medicare Other | Source: Ambulatory Visit | Attending: Orthopaedic Surgery | Admitting: Orthopaedic Surgery

## 2017-02-01 ENCOUNTER — Encounter (HOSPITAL_COMMUNITY): Payer: Self-pay

## 2017-02-01 DIAGNOSIS — K219 Gastro-esophageal reflux disease without esophagitis: Secondary | ICD-10-CM | POA: Diagnosis not present

## 2017-02-01 DIAGNOSIS — E039 Hypothyroidism, unspecified: Secondary | ICD-10-CM | POA: Diagnosis not present

## 2017-02-01 DIAGNOSIS — R011 Cardiac murmur, unspecified: Secondary | ICD-10-CM | POA: Insufficient documentation

## 2017-02-01 DIAGNOSIS — Z9889 Other specified postprocedural states: Secondary | ICD-10-CM | POA: Diagnosis not present

## 2017-02-01 DIAGNOSIS — E785 Hyperlipidemia, unspecified: Secondary | ICD-10-CM | POA: Insufficient documentation

## 2017-02-01 DIAGNOSIS — Z01818 Encounter for other preprocedural examination: Secondary | ICD-10-CM | POA: Insufficient documentation

## 2017-02-01 DIAGNOSIS — Z96651 Presence of right artificial knee joint: Secondary | ICD-10-CM | POA: Diagnosis not present

## 2017-02-01 DIAGNOSIS — M797 Fibromyalgia: Secondary | ICD-10-CM | POA: Insufficient documentation

## 2017-02-01 DIAGNOSIS — K589 Irritable bowel syndrome without diarrhea: Secondary | ICD-10-CM | POA: Insufficient documentation

## 2017-02-01 HISTORY — DX: Headache, unspecified: R51.9

## 2017-02-01 HISTORY — DX: Fibromyalgia: M79.7

## 2017-02-01 HISTORY — DX: Headache: R51

## 2017-02-01 HISTORY — DX: Unspecified asthma, uncomplicated: J45.909

## 2017-02-01 HISTORY — DX: Other complications of anesthesia, initial encounter: T88.59XA

## 2017-02-01 HISTORY — DX: Adverse effect of unspecified anesthetic, initial encounter: T41.45XA

## 2017-02-01 HISTORY — DX: Failed or difficult intubation, initial encounter: T88.4XXA

## 2017-02-01 HISTORY — DX: Cardiac murmur, unspecified: R01.1

## 2017-02-01 LAB — SURGICAL PCR SCREEN
MRSA, PCR: NEGATIVE
STAPHYLOCOCCUS AUREUS: POSITIVE — AB

## 2017-02-01 LAB — URINALYSIS, ROUTINE W REFLEX MICROSCOPIC
Bilirubin Urine: NEGATIVE
GLUCOSE, UA: NEGATIVE mg/dL
Hgb urine dipstick: NEGATIVE
Ketones, ur: NEGATIVE mg/dL
LEUKOCYTES UA: NEGATIVE
NITRITE: NEGATIVE
PH: 6 (ref 5.0–8.0)
Protein, ur: NEGATIVE mg/dL
SPECIFIC GRAVITY, URINE: 1.014 (ref 1.005–1.030)

## 2017-02-01 LAB — COMPREHENSIVE METABOLIC PANEL
ALBUMIN: 4.1 g/dL (ref 3.5–5.0)
ALT: 13 U/L — AB (ref 14–54)
ANION GAP: 11 (ref 5–15)
AST: 25 U/L (ref 15–41)
Alkaline Phosphatase: 65 U/L (ref 38–126)
BUN: 14 mg/dL (ref 6–20)
CHLORIDE: 102 mmol/L (ref 101–111)
CO2: 23 mmol/L (ref 22–32)
Calcium: 9.5 mg/dL (ref 8.9–10.3)
Creatinine, Ser: 0.79 mg/dL (ref 0.44–1.00)
GFR calc non Af Amer: >60 mL/min (ref >60)
GLUCOSE: 98 mg/dL (ref 65–99)
Potassium: 4 mmol/L (ref 3.5–5.1)
SODIUM: 136 mmol/L (ref 135–145)
Total Bilirubin: 0.6 mg/dL (ref 0.3–1.2)
Total Protein: 7.2 g/dL (ref 6.5–8.1)

## 2017-02-01 LAB — CBC
HCT: 40.3 % (ref 36.0–46.0)
HEMOGLOBIN: 13.3 g/dL (ref 12.0–15.0)
MCH: 30.2 pg (ref 26.0–34.0)
MCHC: 33 g/dL (ref 30.0–36.0)
MCV: 91.4 fL (ref 78.0–100.0)
PLATELETS: 232 10*3/uL (ref 150–400)
RBC: 4.41 MIL/uL (ref 3.87–5.11)
RDW: 12.7 % (ref 11.5–15.5)
WBC: 6.8 10*3/uL (ref 4.0–10.5)

## 2017-02-01 NOTE — Progress Notes (Signed)
PCP is Dr. Eliezer Lofts  LOV 01/2016 Cardio is Dr. Esmond Plants  LOV 11/2016 - stress test done 11/2016, Echo 12/2014.  She at present denies any heart issues. She states she has "hiccups" periodically --  Skipped beats, doesn't feel them, and very rarely. Will be seeing Dr. Estanislado Pandy - rheumatologist tomorrow 02/02/2017 In 06/25/2014, patient was having urology procedure( for bowel & bladder incontinence) in [facility] by a Dr. Jacqlyn Larsen.  Note inside chart stating she was a difficult intubation.  Dr. Jillyn Hidden, who did her anesthesia for her knee, did not have any issues.

## 2017-02-01 NOTE — Progress Notes (Signed)
Anesthesia Note: Patient is a 76 year old female scheduled for right total shoulder arthroplasty on 02/05/17 by Dr. Lorin Mercy.  History includes never smoker, DIFFICULT AIRWAY, HLD, thyroid goiter s/p resection (surgical hypothyroidism), chronic cystitis (s/p InterStim placement) 06/25/14 (Dr. Edrick Oh, [place]), GERD, ischemic colitis '13, IBS, chronic diarrhea (with occasional stool incontinence), murmur, asthma, fibromyalgia (Dr. Bo Merino), tonsillectomy, right TKA 12/11/15.   In regards to her DIFFICULT AIRWAY history from 06/25/14 at [facility]: 3 attempts to place 7.5 ETT (DL X 2 with MAC 3 unable to get view; unable to pass ETT or bougie with DL). Success with glidescope with 3 blade (grade 1 view). Easy mask ventilation. (Records can be viewed in [place].) Patient now wears a DIFFICULT AIRWAY bracelet. On 12/11/15 [facility]) a Sabra Heck and 2 with stylet were used to place 7.5 ETT. One attempt.    - PCP is Dr. Eliezer Lofts.  - Cardiologist is Dr. Ida Rogue, last visit on 12/08/16 for follow-up atypical chest pain, PACs, bradycardia. She has had multiple stress tests since 2004 for evaluation of chest pain, most recent 11/2016 and no further work-up recommended. She was not very symptomatic of her PACs, although could not tolerate b-blockers due to bradycardia. Continue to monitor recommended. Six month follow-up planned.   Meds include losartan, melatonin, Ditropan XL, Protonix, flax seed oil.   BP (!) 144/81   Pulse (!) 55   Temp 36.6 C   Resp 20   Ht _0  (1.6 m)   Wt 154 lb 4.8 oz (70 kg)   SpO2 95%   BMI 27.33 kg/m   EKG 11/2716: SB at 51 bpm.   Nuclear stress test 11/25/16:  The left ventricular ejection fraction is hyperdynamic (>65%).  Nuclear stress EF: 82%.  There was no ST segment deviation noted during stress.  Defect 1: There is a medium defect of moderate severity present in the mid anteroseptal, apical anterior and apical septal location. Consistent  with breast attenuation vs prior infarct in the distal LAD territory.  This is a low risk study.  Echo 12/13/14: Study Conclusions - Left ventricle: The cavity size was normal. Systolic function was normal. The estimated ejection fraction was in the range of 55% to 60%. Wall motion was normal; there were no regional wall motion abnormalities. Doppler parameters are consistent with abnormal left ventricular relaxation (grade 1 diastolic dysfunction). - Aortic valve: There was trivial regurgitation. - Mitral valve: Calcified annulus. There was mild to moderate regurgitation. - Right ventricle: Systolic function was normal. - Pulmonary arteries: Systolic pressure was within the normal range.  Spirometry 11/18/16: FVC 1.95 (74%), FEV1 1.65 (83%), FEF 25-75% 1.92 (124%). Mild restriction.  Preoperative labs noted. Thyroid studies were normal on 10/22/16.  If no acute changes then I anticipate that she can proceed as planned.   George Hugh [facility] Short Stay Center/Anesthesiology Phone [phone] 02/01/2017 3:44 PM

## 2017-02-01 NOTE — Pre-Procedure Instructions (Signed)
    Sherri Murray  02/01/2017      [id number] - [address], [place] [address] [place] [address] Phone: [phone] Fax: [phone]    Your procedure is scheduled on Friday, April 27th   Report to [facility] Admitting at 5:30 AM.             (posted surgery time 7:30 - 10:19 am)   Call this number if you have problems the [facility] of surgery:  [phone], otherwise, call [phone] with general questions, Mon - Fri from 8-4:3pm..   Remember:  Do not eat food or drink liquids after midnight Thursday.   Take these medicines the morning of surgery with A SIP OF WATER : nothing.              4-5 days prior to surgery, STOP taking Vitamins, Herbal Supplements, Anti-inflammatories.   Do not wear jewelry, make-up or nail polish.  Do not wear lotions, powders, perfumes, or deoderant.  Do not shave 48 hours prior to surgery.     Do not bring valuables to the hospital.  [facility] is not responsible for any belongings or valuables.  Contacts, dentures or bridgework may not be worn into surgery.  Leave your suitcase in the car.  After surgery it may be brought to your room.  For patients admitted to the hospital, discharge time will be determined by your treatment team.  Please read over the following fact sheets that you were given. Pain Booklet, MRSA Information and Surgical Site Infection Prevention

## 2017-02-02 ENCOUNTER — Ambulatory Visit (INDEPENDENT_AMBULATORY_CARE_PROVIDER_SITE_OTHER): Payer: Medicare Other | Admitting: Rheumatology

## 2017-02-02 ENCOUNTER — Telehealth: Payer: Self-pay | Admitting: Allergy & Immunology

## 2017-02-02 ENCOUNTER — Ambulatory Visit (INDEPENDENT_AMBULATORY_CARE_PROVIDER_SITE_OTHER): Payer: Medicare Other | Admitting: *Deleted

## 2017-02-02 ENCOUNTER — Encounter: Payer: Self-pay | Admitting: Rheumatology

## 2017-02-02 VITALS — BP 143/72 | HR 57 | Resp 16 | Ht 63.0 in | Wt 155.0 lb

## 2017-02-02 DIAGNOSIS — M797 Fibromyalgia: Secondary | ICD-10-CM

## 2017-02-02 DIAGNOSIS — M47816 Spondylosis without myelopathy or radiculopathy, lumbar region: Secondary | ICD-10-CM | POA: Diagnosis not present

## 2017-02-02 DIAGNOSIS — T63441D Toxic effect of venom of bees, accidental (unintentional), subsequent encounter: Secondary | ICD-10-CM

## 2017-02-02 DIAGNOSIS — M19041 Primary osteoarthritis, right hand: Secondary | ICD-10-CM | POA: Diagnosis not present

## 2017-02-02 DIAGNOSIS — K580 Irritable bowel syndrome with diarrhea: Secondary | ICD-10-CM | POA: Diagnosis not present

## 2017-02-02 DIAGNOSIS — I10 Essential (primary) hypertension: Secondary | ICD-10-CM | POA: Diagnosis not present

## 2017-02-02 DIAGNOSIS — I5189 Other ill-defined heart diseases: Secondary | ICD-10-CM

## 2017-02-02 DIAGNOSIS — I519 Heart disease, unspecified: Secondary | ICD-10-CM

## 2017-02-02 DIAGNOSIS — M19042 Primary osteoarthritis, left hand: Secondary | ICD-10-CM

## 2017-02-02 DIAGNOSIS — M7501 Adhesive capsulitis of right shoulder: Secondary | ICD-10-CM | POA: Diagnosis not present

## 2017-02-02 DIAGNOSIS — R7303 Prediabetes: Secondary | ICD-10-CM | POA: Diagnosis not present

## 2017-02-02 DIAGNOSIS — M19111 Post-traumatic osteoarthritis, right shoulder: Secondary | ICD-10-CM

## 2017-02-02 DIAGNOSIS — Z96651 Presence of right artificial knee joint: Secondary | ICD-10-CM | POA: Diagnosis not present

## 2017-02-02 NOTE — Telephone Encounter (Signed)
Pt came in and said that she paid $20.00 on the no show and then got a bill for $25.00. Please see if you have $20.00 undistributed within the patients billing information.  [phone]

## 2017-02-02 NOTE — Telephone Encounter (Signed)
Pmt was put in Epic instead of MM - will get refund from [facility] - call & explained to pt

## 2017-02-04 ENCOUNTER — Telehealth: Payer: Self-pay | Admitting: Rheumatology

## 2017-02-04 DIAGNOSIS — M79641 Pain in right hand: Secondary | ICD-10-CM

## 2017-02-04 DIAGNOSIS — M79642 Pain in left hand: Principal | ICD-10-CM

## 2017-02-04 NOTE — Telephone Encounter (Signed)
Dr. D saw patient and she asked me to explain XRAY results to patient. Pt had angulations at a few DIP joints and I advised pt that in the future, she may benefit from seeing a hand specialist for evaluation and treatment. She may benefit from their care. She can see Dr. Levell July office for evaluation and treatment. If you want to refer, you can refer w/ diagnosis of OA both hands with angulations at few DIP  joints. Evaluate and Treat.

## 2017-02-04 NOTE — Telephone Encounter (Signed)
I have called patient to advise, put in referral

## 2017-02-04 NOTE — Telephone Encounter (Signed)
Patient states she was seen the other day and Mr. Carlyon Shadow was told her he would give her the number of a hand/finger doctor? (that's all the info she knew).  She is requesting this information please.

## 2017-02-05 ENCOUNTER — Encounter (HOSPITAL_COMMUNITY)
Admission: RE | Disposition: A | Discharge status: Home / Self Care | Payer: Self-pay | Source: Ambulatory Visit | Attending: Orthopaedic Surgery

## 2017-02-05 ENCOUNTER — Inpatient Hospital Stay (HOSPITAL_COMMUNITY): Payer: Medicare Other

## 2017-02-05 ENCOUNTER — Encounter (HOSPITAL_COMMUNITY): Payer: Self-pay | Admitting: *Deleted

## 2017-02-05 ENCOUNTER — Inpatient Hospital Stay (HOSPITAL_COMMUNITY): Payer: Medicare Other | Admitting: Vascular Surgery

## 2017-02-05 ENCOUNTER — Inpatient Hospital Stay (HOSPITAL_COMMUNITY)
Admission: RE | Admit: 2017-02-05 | Discharge: 2017-02-07 | DRG: 483 | Disposition: A | Discharge status: Home / Self Care | Payer: Medicare Other | Source: Ambulatory Visit | Attending: Orthopaedic Surgery | Admitting: Orthopaedic Surgery

## 2017-02-05 ENCOUNTER — Inpatient Hospital Stay (HOSPITAL_COMMUNITY): Payer: Medicare Other | Admitting: Certified Registered Nurse Anesthetist

## 2017-02-05 DIAGNOSIS — Z833 Family history of diabetes mellitus: Secondary | ICD-10-CM

## 2017-02-05 DIAGNOSIS — Z9841 Cataract extraction status, right eye: Secondary | ICD-10-CM

## 2017-02-05 DIAGNOSIS — Z8349 Family history of other endocrine, nutritional and metabolic diseases: Secondary | ICD-10-CM

## 2017-02-05 DIAGNOSIS — Z96651 Presence of right artificial knee joint: Secondary | ICD-10-CM | POA: Diagnosis present

## 2017-02-05 DIAGNOSIS — Z886 Allergy status to analgesic agent status: Secondary | ICD-10-CM | POA: Diagnosis not present

## 2017-02-05 DIAGNOSIS — K219 Gastro-esophageal reflux disease without esophagitis: Secondary | ICD-10-CM | POA: Diagnosis not present

## 2017-02-05 DIAGNOSIS — Z471 Aftercare following joint replacement surgery: Secondary | ICD-10-CM | POA: Diagnosis not present

## 2017-02-05 DIAGNOSIS — Z8042 Family history of malignant neoplasm of prostate: Secondary | ICD-10-CM | POA: Diagnosis not present

## 2017-02-05 DIAGNOSIS — M19011 Primary osteoarthritis, right shoulder: Secondary | ICD-10-CM | POA: Diagnosis not present

## 2017-02-05 DIAGNOSIS — I10 Essential (primary) hypertension: Secondary | ICD-10-CM | POA: Diagnosis not present

## 2017-02-05 DIAGNOSIS — Z91041 Radiographic dye allergy status: Secondary | ICD-10-CM | POA: Diagnosis not present

## 2017-02-05 DIAGNOSIS — Z96611 Presence of right artificial shoulder joint: Secondary | ICD-10-CM | POA: Diagnosis not present

## 2017-02-05 DIAGNOSIS — M797 Fibromyalgia: Secondary | ICD-10-CM | POA: Diagnosis present

## 2017-02-05 DIAGNOSIS — M19211 Secondary osteoarthritis, right shoulder: Secondary | ICD-10-CM | POA: Diagnosis present

## 2017-02-05 DIAGNOSIS — Z09 Encounter for follow-up examination after completed treatment for conditions other than malignant neoplasm: Secondary | ICD-10-CM

## 2017-02-05 DIAGNOSIS — Z800 Family history of malignant neoplasm of digestive organs: Secondary | ICD-10-CM

## 2017-02-05 DIAGNOSIS — Z823 Family history of stroke: Secondary | ICD-10-CM | POA: Diagnosis not present

## 2017-02-05 DIAGNOSIS — G4730 Sleep apnea, unspecified: Secondary | ICD-10-CM | POA: Diagnosis not present

## 2017-02-05 DIAGNOSIS — E781 Pure hyperglyceridemia: Secondary | ICD-10-CM | POA: Diagnosis present

## 2017-02-05 DIAGNOSIS — Z803 Family history of malignant neoplasm of breast: Secondary | ICD-10-CM

## 2017-02-05 DIAGNOSIS — Z888 Allergy status to other drugs, medicaments and biological substances status: Secondary | ICD-10-CM

## 2017-02-05 DIAGNOSIS — Z8249 Family history of ischemic heart disease and other diseases of the circulatory system: Secondary | ICD-10-CM

## 2017-02-05 DIAGNOSIS — Z882 Allergy status to sulfonamides status: Secondary | ICD-10-CM | POA: Diagnosis not present

## 2017-02-05 DIAGNOSIS — M25511 Pain in right shoulder: Secondary | ICD-10-CM | POA: Diagnosis present

## 2017-02-05 DIAGNOSIS — K589 Irritable bowel syndrome without diarrhea: Secondary | ICD-10-CM | POA: Diagnosis present

## 2017-02-05 DIAGNOSIS — Z91018 Allergy to other foods: Secondary | ICD-10-CM | POA: Diagnosis not present

## 2017-02-05 DIAGNOSIS — E785 Hyperlipidemia, unspecified: Secondary | ICD-10-CM | POA: Diagnosis present

## 2017-02-05 DIAGNOSIS — Z880 Allergy status to penicillin: Secondary | ICD-10-CM | POA: Diagnosis not present

## 2017-02-05 DIAGNOSIS — R011 Cardiac murmur, unspecified: Secondary | ICD-10-CM | POA: Diagnosis present

## 2017-02-05 DIAGNOSIS — Z91040 Latex allergy status: Secondary | ICD-10-CM | POA: Diagnosis not present

## 2017-02-05 DIAGNOSIS — M87221 Osteonecrosis due to previous trauma, right humerus: Secondary | ICD-10-CM | POA: Diagnosis not present

## 2017-02-05 DIAGNOSIS — Z91030 Bee allergy status: Secondary | ICD-10-CM | POA: Diagnosis not present

## 2017-02-05 DIAGNOSIS — Z91010 Allergy to peanuts: Secondary | ICD-10-CM | POA: Diagnosis not present

## 2017-02-05 DIAGNOSIS — G8918 Other acute postprocedural pain: Secondary | ICD-10-CM | POA: Diagnosis not present

## 2017-02-05 HISTORY — PX: TOTAL SHOULDER ARTHROPLASTY: SHX126

## 2017-02-05 SURGERY — ARTHROPLASTY, SHOULDER, TOTAL
Anesthesia: General | Laterality: Right

## 2017-02-05 MED ORDER — CHLORHEXIDINE GLUCONATE 4 % EX LIQD: 60.0000 mL | Freq: Once | CUTANEOUS | Status: DC

## 2017-02-05 MED ORDER — PHENOL 1.4 % MT LIQD
1.0000 | OROMUCOSAL | Status: DC | PRN
Start: 2017-02-05 — End: 2017-02-07

## 2017-02-05 MED ORDER — ONDANSETRON HCL 4 MG PO TABS: 4.0000 mg | ORAL_TABLET | Freq: Four times a day (QID) | ORAL | Status: DC | PRN

## 2017-02-05 MED ORDER — OXYCODONE HCL 5 MG PO TABS: 5.0000 mg | ORAL_TABLET | Freq: Once | ORAL | Status: DC | PRN

## 2017-02-05 MED ORDER — ONDANSETRON HCL 4 MG/2ML IJ SOLN
INTRAMUSCULAR | Status: AC
  Filled 2017-02-05: qty 2

## 2017-02-05 MED ORDER — DOCUSATE SODIUM 100 MG PO CAPS
100.0000 mg | ORAL_CAPSULE | Freq: Two times a day (BID) | ORAL | Status: DC
  Administered 2017-02-05 – 2017-02-07 (×5): 100 mg via ORAL
  Filled 2017-02-05 (×5): qty 1

## 2017-02-05 MED ORDER — BUPIVACAINE-EPINEPHRINE (PF) 0.5% -1:200000 IJ SOLN
INTRAMUSCULAR | Status: DC | PRN
  Administered 2017-02-05: 25 mL via PERINEURAL

## 2017-02-05 MED ORDER — ROCURONIUM BROMIDE 10 MG/ML (PF) SYRINGE
PREFILLED_SYRINGE | INTRAVENOUS | Status: AC
  Filled 2017-02-05: qty 5

## 2017-02-05 MED ORDER — CALCIUM CARBONATE-VITAMIN D 600-400 MG-UNIT PO CHEW: 1.0000 | CHEWABLE_TABLET | Freq: Every day | ORAL | Status: DC

## 2017-02-05 MED ORDER — METOCLOPRAMIDE HCL 5 MG PO TABS: 5.0000 mg | ORAL_TABLET | Freq: Three times a day (TID) | ORAL | Status: DC | PRN

## 2017-02-05 MED ORDER — DEXAMETHASONE SODIUM PHOSPHATE 10 MG/ML IJ SOLN
INTRAMUSCULAR | Status: DC | PRN
  Administered 2017-02-05: 10 mg via INTRAVENOUS

## 2017-02-05 MED ORDER — MIDAZOLAM HCL 5 MG/5ML IJ SOLN
INTRAMUSCULAR | Status: DC | PRN
  Administered 2017-02-05: 1 mg via INTRAVENOUS

## 2017-02-05 MED ORDER — LOSARTAN POTASSIUM 50 MG PO TABS
50.0000 mg | ORAL_TABLET | Freq: Every day | ORAL | Status: DC
  Administered 2017-02-05 – 2017-02-07 (×3): 50 mg via ORAL
  Filled 2017-02-05 (×4): qty 1

## 2017-02-05 MED ORDER — ONDANSETRON HCL 4 MG/2ML IJ SOLN: 4.0000 mg | Freq: Four times a day (QID) | INTRAMUSCULAR | Status: DC | PRN

## 2017-02-05 MED ORDER — ACETAMINOPHEN 10 MG/ML IV SOLN
INTRAVENOUS | Status: AC
  Filled 2017-02-05: qty 100

## 2017-02-05 MED ORDER — PANTOPRAZOLE SODIUM 40 MG PO TBEC
40.0000 mg | DELAYED_RELEASE_TABLET | Freq: Every day | ORAL | Status: DC
  Administered 2017-02-05 – 2017-02-07 (×3): 40 mg via ORAL
  Filled 2017-02-05 (×2): qty 1

## 2017-02-05 MED ORDER — BIOTIN 5000 MCG PO TABS: 5000.0000 ug | ORAL_TABLET | Freq: Every day | ORAL | Status: DC

## 2017-02-05 MED ORDER — HYDROMORPHONE HCL 1 MG/ML IJ SOLN
0.5000 mg | INTRAMUSCULAR | Status: DC | PRN
Start: 2017-02-05 — End: 2017-02-07
  Administered 2017-02-06 – 2017-02-07 (×4): 0.5 mg via INTRAVENOUS
  Filled 2017-02-05 (×4): qty 1

## 2017-02-05 MED ORDER — BUPIVACAINE-EPINEPHRINE 0.5% -1:200000 IJ SOLN
INTRAMUSCULAR | Status: DC | PRN
  Administered 2017-02-05: 10 mL

## 2017-02-05 MED ORDER — EPINEPHRINE 0.15 MG/0.3ML IJ SOAJ: 0.1500 mg | Freq: Every day | INTRAMUSCULAR | Status: DC | PRN

## 2017-02-05 MED ORDER — MENTHOL 3 MG MT LOZG
1.0000 | LOZENGE | OROMUCOSAL | Status: DC | PRN
Start: 2017-02-05 — End: 2017-02-07

## 2017-02-05 MED ORDER — EPHEDRINE SULFATE 50 MG/ML IJ SOLN
INTRAMUSCULAR | Status: DC | PRN
  Administered 2017-02-05: 10 mg via INTRAVENOUS

## 2017-02-05 MED ORDER — FENTANYL CITRATE (PF) 100 MCG/2ML IJ SOLN
INTRAMUSCULAR | Status: DC | PRN
  Administered 2017-02-05 (×2): 50 ug via INTRAVENOUS

## 2017-02-05 MED ORDER — CHOLECALCIFEROL 25 MCG (1000 UT) PO CAPS: 1000.0000 [IU] | ORAL_CAPSULE | Freq: Every day | ORAL | Status: DC

## 2017-02-05 MED ORDER — ACETAMINOPHEN 10 MG/ML IV SOLN
INTRAVENOUS | Status: DC | PRN
  Administered 2017-02-05: 1000 mg via INTRAVENOUS

## 2017-02-05 MED ORDER — ONDANSETRON HCL 4 MG/2ML IJ SOLN
INTRAMUSCULAR | Status: DC | PRN
  Administered 2017-02-05: 4 mg via INTRAVENOUS

## 2017-02-05 MED ORDER — DEXAMETHASONE SODIUM PHOSPHATE 10 MG/ML IJ SOLN
INTRAMUSCULAR | Status: AC
  Filled 2017-02-05: qty 1

## 2017-02-05 MED ORDER — OXYCODONE HCL 5 MG/5ML PO SOLN: 5.0000 mg | Freq: Once | ORAL | Status: DC | PRN

## 2017-02-05 MED ORDER — CALCIUM CARBONATE-VITAMIN D 500-200 MG-UNIT PO TABS
1.0000 | ORAL_TABLET | Freq: Every day | ORAL | Status: DC
  Administered 2017-02-06 – 2017-02-07 (×2): 1 via ORAL
  Filled 2017-02-05 (×2): qty 1

## 2017-02-05 MED ORDER — EPHEDRINE 5 MG/ML INJ
INTRAVENOUS | Status: AC
  Filled 2017-02-05: qty 10

## 2017-02-05 MED ORDER — OXYCODONE-ACETAMINOPHEN 5-325 MG PO TABS: 2.0000 | ORAL_TABLET | Freq: Four times a day (QID) | ORAL | 0 refills | Status: DC | PRN

## 2017-02-05 MED ORDER — SUGAMMADEX SODIUM 200 MG/2ML IV SOLN
INTRAVENOUS | Status: DC | PRN
  Administered 2017-02-05: 125 mg via INTRAVENOUS

## 2017-02-05 MED ORDER — LACTATED RINGERS IV SOLN
INTRAVENOUS | Status: DC | PRN
  Administered 2017-02-05: 07:00:00 via INTRAVENOUS

## 2017-02-05 MED ORDER — PHENYLEPHRINE HCL 10 MG/ML IJ SOLN
INTRAVENOUS | Status: DC | PRN
  Administered 2017-02-05: 25 ug/min via INTRAVENOUS

## 2017-02-05 MED ORDER — CLINDAMYCIN PHOSPHATE 900 MG/50ML IV SOLN
900.0000 mg | INTRAVENOUS | Status: AC
  Administered 2017-02-05: 900 mg via INTRAVENOUS
  Filled 2017-02-05: qty 50

## 2017-02-05 MED ORDER — PROPOFOL 10 MG/ML IV BOLUS
INTRAVENOUS | Status: DC | PRN
  Administered 2017-02-05: 100 mg via INTRAVENOUS

## 2017-02-05 MED ORDER — VITAMIN D 1000 UNITS PO TABS
1000.0000 [IU] | ORAL_TABLET | Freq: Every day | ORAL | Status: DC
  Administered 2017-02-05 – 2017-02-07 (×3): 1000 [IU] via ORAL
  Filled 2017-02-05 (×3): qty 1

## 2017-02-05 MED ORDER — METOCLOPRAMIDE HCL 5 MG/ML IJ SOLN: 5.0000 mg | Freq: Three times a day (TID) | INTRAMUSCULAR | Status: DC | PRN

## 2017-02-05 MED ORDER — SUGAMMADEX SODIUM 200 MG/2ML IV SOLN
INTRAVENOUS | Status: AC
  Filled 2017-02-05: qty 2

## 2017-02-05 MED ORDER — VANCOMYCIN HCL IN DEXTROSE 1-5 GM/200ML-% IV SOLN
1000.0000 mg | INTRAVENOUS | Status: AC
  Administered 2017-02-05: 25 mg via INTRAVENOUS
  Filled 2017-02-05: qty 200

## 2017-02-05 MED ORDER — SODIUM CHLORIDE 0.9 % IV SOLN
INTRAVENOUS | Status: DC
  Administered 2017-02-05: 14:00:00 via INTRAVENOUS

## 2017-02-05 MED ORDER — FENTANYL CITRATE (PF) 100 MCG/2ML IJ SOLN: 25.0000 ug | INTRAMUSCULAR | Status: DC | PRN

## 2017-02-05 MED ORDER — FENTANYL CITRATE (PF) 250 MCG/5ML IJ SOLN
INTRAMUSCULAR | Status: AC
  Filled 2017-02-05: qty 5

## 2017-02-05 MED ORDER — PROPOFOL 10 MG/ML IV BOLUS
INTRAVENOUS | Status: AC
  Filled 2017-02-05: qty 20

## 2017-02-05 MED ORDER — BUPIVACAINE-EPINEPHRINE (PF) 0.5% -1:200000 IJ SOLN
INTRAMUSCULAR | Status: AC
  Filled 2017-02-05: qty 30

## 2017-02-05 MED ORDER — OXYBUTYNIN CHLORIDE ER 10 MG PO TB24
10.0000 mg | ORAL_TABLET | Freq: Every day | ORAL | Status: DC
  Administered 2017-02-05 – 2017-02-06 (×2): 10 mg via ORAL
  Filled 2017-02-05 (×2): qty 1

## 2017-02-05 MED ORDER — LIDOCAINE HCL (CARDIAC) 20 MG/ML IV SOLN
INTRAVENOUS | Status: DC | PRN
  Administered 2017-02-05: 60 mg via INTRAVENOUS

## 2017-02-05 MED ORDER — MIDAZOLAM HCL 2 MG/2ML IJ SOLN
INTRAMUSCULAR | Status: AC
  Filled 2017-02-05: qty 2

## 2017-02-05 MED ORDER — ROCURONIUM BROMIDE 100 MG/10ML IV SOLN
INTRAVENOUS | Status: DC | PRN
  Administered 2017-02-05: 40 mg via INTRAVENOUS

## 2017-02-05 MED ORDER — ACETAMINOPHEN 650 MG RE SUPP
650.0000 mg | Freq: Four times a day (QID) | RECTAL | Status: DC | PRN
Start: 2017-02-05 — End: 2017-02-07

## 2017-02-05 MED ORDER — ONDANSETRON HCL 4 MG/2ML IJ SOLN
4.0000 mg | Freq: Four times a day (QID) | INTRAMUSCULAR | Status: DC | PRN
  Administered 2017-02-06: 4 mg via INTRAVENOUS
  Filled 2017-02-05: qty 2

## 2017-02-05 MED ORDER — ACETAMINOPHEN 325 MG PO TABS
650.0000 mg | ORAL_TABLET | Freq: Four times a day (QID) | ORAL | Status: DC | PRN
Start: 2017-02-05 — End: 2017-02-07
  Administered 2017-02-05 – 2017-02-07 (×4): 650 mg via ORAL
  Filled 2017-02-05 (×4): qty 2

## 2017-02-05 MED ORDER — OXYCODONE HCL 5 MG PO TABS
5.0000 mg | ORAL_TABLET | Freq: Four times a day (QID) | ORAL | Status: DC | PRN
  Administered 2017-02-05 – 2017-02-07 (×7): 5 mg via ORAL
  Filled 2017-02-05 (×7): qty 1

## 2017-02-05 MED ORDER — SODIUM CHLORIDE 0.9 % IR SOLN
Status: DC | PRN
  Administered 2017-02-05: 1000 mL

## 2017-02-05 MED ORDER — LIDOCAINE 2% (20 MG/ML) 5 ML SYRINGE
INTRAMUSCULAR | Status: AC
  Filled 2017-02-05: qty 5

## 2017-02-05 SURGICAL SUPPLY — 53 items
BLADE SAW SGTL 18X1.27X75 (BLADE) ×2 IMPLANT
CAPT SHLDR TOTAL 2 ×2 IMPLANT
CEMENT HV SMART SET (Cement) ×2 IMPLANT
COVER SURGICAL LIGHT HANDLE (MISCELLANEOUS) ×2 IMPLANT
DRAPE IMP U-DRAPE 54X76 (DRAPES) ×2 IMPLANT
DRAPE INCISE IOBAN 66X45 STRL (DRAPES) ×2 IMPLANT
DRAPE U-SHAPE 47X51 STRL (DRAPES) ×2 IMPLANT
DRSG ADAPTIC 3X8 NADH LF (GAUZE/BANDAGES/DRESSINGS) IMPLANT
DRSG PAD ABDOMINAL 8X10 ST (GAUZE/BANDAGES/DRESSINGS) IMPLANT
DURAPREP 26ML APPLICATOR (WOUND CARE) ×2 IMPLANT
ELECT REM PT RETURN 9FT ADLT (ELECTROSURGICAL) ×2
ELECTRODE REM PT RTRN 9FT ADLT (ELECTROSURGICAL) ×1 IMPLANT
EVACUATOR 1/8 PVC DRAIN (DRAIN) IMPLANT
GAUZE SPONGE 4X4 12PLY STRL (GAUZE/BANDAGES/DRESSINGS) IMPLANT
GLOVE BIOGEL PI IND STRL 8 (GLOVE) ×2 IMPLANT
GLOVE BIOGEL PI INDICATOR 8 (GLOVE) ×2
GLOVE ORTHO TXT STRL SZ7.5 (GLOVE) ×4 IMPLANT
GOWN STRL REUS W/ TWL LRG LVL3 (GOWN DISPOSABLE) ×1 IMPLANT
GOWN STRL REUS W/ TWL XL LVL3 (GOWN DISPOSABLE) ×1 IMPLANT
GOWN STRL REUS W/TWL 2XL LVL3 (GOWN DISPOSABLE) ×2 IMPLANT
GOWN STRL REUS W/TWL LRG LVL3 (GOWN DISPOSABLE) ×1
GOWN STRL REUS W/TWL XL LVL3 (GOWN DISPOSABLE) ×1
KIT BASIN OR (CUSTOM PROCEDURE TRAY) ×2 IMPLANT
KIT ROOM TURNOVER OR (KITS) ×2 IMPLANT
MANIFOLD NEPTUNE II (INSTRUMENTS) ×2 IMPLANT
NDL SUT 6 .5 CRC .975X.05 MAYO (NEEDLE) ×1 IMPLANT
NEEDLE HYPO 25GX1X1/2 BEV (NEEDLE) ×2 IMPLANT
NEEDLE MAYO TAPER (NEEDLE) ×1
NS IRRIG 1000ML POUR BTL (IV SOLUTION) ×2 IMPLANT
PACK SHOULDER (CUSTOM PROCEDURE TRAY) ×2 IMPLANT
PACK UNIVERSAL I (CUSTOM PROCEDURE TRAY) ×2 IMPLANT
PAD ARMBOARD 7.5X6 YLW CONV (MISCELLANEOUS) ×4 IMPLANT
PASSER SUT SWANSON 36MM LOOP (INSTRUMENTS) IMPLANT
PIN METAGLENE 2.5 (PIN) IMPLANT
SLING ARM IMMOBILIZER LRG (SOFTGOODS) ×2 IMPLANT
SMARTMIX MINI TOWER (MISCELLANEOUS)
SPONGE LAP 18X18 X RAY DECT (DISPOSABLE) IMPLANT
STAPLER VISISTAT 35W (STAPLE) IMPLANT
STRIP CLOSURE SKIN 1/2X4 (GAUZE/BANDAGES/DRESSINGS) IMPLANT
SUCTION FRAZIER HANDLE 10FR (MISCELLANEOUS) ×1
SUCTION TUBE FRAZIER 10FR DISP (MISCELLANEOUS) ×1 IMPLANT
SUT FIBERWIRE #2 38 T-5 BLUE (SUTURE) ×6
SUT VIC AB 0 CT1 27 (SUTURE) ×1
SUT VIC AB 0 CT1 27XBRD ANBCTR (SUTURE) ×1 IMPLANT
SUT VIC AB 2-0 CT1 27 (SUTURE) ×1
SUT VIC AB 2-0 CT1 TAPERPNT 27 (SUTURE) ×1 IMPLANT
SUTURE FIBERWR #2 38 T-5 BLUE (SUTURE) ×3 IMPLANT
SYR CONTROL 10ML LL (SYRINGE) ×2 IMPLANT
TOWEL OR 17X24 6PK STRL BLUE (TOWEL DISPOSABLE) ×2 IMPLANT
TOWEL OR 17X26 10 PK STRL BLUE (TOWEL DISPOSABLE) ×2 IMPLANT
TOWER SMARTMIX MINI (MISCELLANEOUS) IMPLANT
TRAY FOLEY W/METER SILVER 16FR (SET/KITS/TRAYS/PACK) IMPLANT
WATER STERILE IRR 1000ML POUR (IV SOLUTION) IMPLANT

## 2017-02-05 NOTE — Op Note (Signed)
Preop diagnosis: Right shoulder osteoarthritis secondary to post traumatic avascular necrosis humeral head.  Postop diagnosis: Same  Procedure: Right total shoulder arthroplasty  Surgeon: Rodell Perna M.D.  Assistant: Benjiman Core PA-C medically necessary and present for the entire procedure  Anesthesia is general plus preoperative block plus Marcaine skin local  EBL: Per anesthetic record  Implants:depuy global shoulder with 40 mm glenoid. 10 mm stem 44+18 mm head 135 anatomic proximal body.  Procedure after induction general anesthesia patient received the clindamycin preoperatively due to penicillin allergy and patient had positive MSSA on her PCR. Timeout procedure was completed with beachchair positioning prepping with DuraPrep impervious stockinette split sheets drapes sterile skin marker and Betadine Steri-Drape times to the seal the skin. Deltopectoral incision was made. Cephalic vein was taken lateral with the deltoid. There was scar tissue present from the patient's previous fracture with secondary AVN. Conjoined tendon was identified self cane retractor was placed. Narrow large Hohmann placed underneath the deltoid. Subscap had sutures passed through it and then divided. Joint was opened there was the clear synovial fluid no purulence. There is significant deformity of the head with the 1.5 cm in superior osteophytes and as the head had undergone AVN it did shifted posteriorly. The guide was used for 20 retroversion for cutting the neck and the due to the old deformity despite using the guide cut still didn't look correct due to subsidence of the avascular fragment. Inferior spurs removed. Glenoid showed cystic changes in total loss of articular cartilage. Humeral head was repaired first starting with the spike retractor using 20 of retroversion on the guide in line with the forearm. Next trials after hand reaming up to 10 mm. This gave tight distal fit and small sponge was placed in the  canal. Glenoid was then prepared with the reaming. Patient was small and a 40 mm trial fit the glenoid nicely. It was reamed the central pin was placed and then using this are reaming for the 3 holes. Pulse lavage vacuum mixing of the cement cement was inserted using the small gun and then the polyethylene 40 mm 3 peg glenoid was inserted impacted down with the hammer and pressure was held until the cement was hard at 15 minutes. All excessive cement had been removed. Next the stem was inserted using the 20 retroversion guide attachment on the handle make sure rotation was correct due to the patient's post fracture deformity. Once it was inserted and trials were checked the 4418 mm head was selected this put the tip the prosthesis in line with the greater tuberosity. Portion of the the supraspinatus was still attached a piece of bone and this was sutured with #1 FiberWire and then placed back through holes in the bone made with the needle anatomically repairing the rotator cuff. Rotator cuff interval was closed and then subscap was passed through holes in the bone repairing the subscap. Biceps tendon during the procedure had been the damage and it was cut back to the level of the glenoid. Subscap was. Anteriorly. Rotator cuff was inspected shoulders able to be flexed 170 internal rotation with hand past posterior axillary line. Retroversion looked correct stem was very solid and after irrigation deltopectoral was reapproximated with 2-0 Vicryl subtendinous tissue subarticular skin closure postop dressing and the swelling patient tolerated procedure well.

## 2017-02-05 NOTE — Anesthesia Procedure Notes (Signed)
Procedure Name: Intubation Date/Time: 02/05/2017 7:41 AM Performed by: Candis Shine Pre-anesthesia Checklist: Patient identified, Emergency Drugs available, Suction available and Patient being monitored Patient Re-evaluated:Patient Re-evaluated prior to inductionOxygen Delivery Method: Circle System Utilized Preoxygenation: Pre-oxygenation with 100% oxygen Intubation Type: IV induction Ventilation: Mask ventilation without difficulty Laryngoscope Size: Mac and 3 Grade View: Grade I Tube type: Oral Tube size: 7.0 mm Number of attempts: 1 Airway Equipment and Method: Stylet Placement Confirmation: ETT inserted through vocal cords under direct vision,  positive ETCO2 and breath sounds checked- equal and bilateral Secured at: 22 cm Tube secured with: Tape Dental Injury: Teeth and Oropharynx as per pre-operative assessment

## 2017-02-05 NOTE — Anesthesia Preprocedure Evaluation (Signed)
Anesthesia Evaluation  Patient identified by MRN, date of birth, ID band Patient awake    Reviewed: Allergy & Precautions, H&P , NPO status , Patient's Chart, lab work & pertinent test results  History of Anesthesia Complications (+) DIFFICULT AIRWAY and history of anesthetic complications  Airway Mallampati: II   Neck ROM: full    Dental   Pulmonary asthma , sleep apnea ,    breath sounds clear to auscultation       Cardiovascular hypertension, + Peripheral Vascular Disease   Rhythm:regular Rate:Normal     Neuro/Psych  Headaches,    GI/Hepatic GERD  ,  Endo/Other  Hypothyroidism   Renal/GU      Musculoskeletal  (+) Arthritis , Fibromyalgia -  Abdominal   Peds  Hematology  (+) anemia ,   Anesthesia Other Findings   Reproductive/Obstetrics                             Anesthesia Physical Anesthesia Plan  ASA: III  Anesthesia Plan: General   Post-op Pain Management: GA combined w/ Regional for post-op pain   Induction: Intravenous  Airway Management Planned: Oral ETT and Video Laryngoscope Planned  Additional Equipment:   Intra-op Plan:   Post-operative Plan: Extubation in OR  Informed Consent: I have reviewed the patients History and Physical, chart, labs and discussed the procedure including the risks, benefits and alternatives for the proposed anesthesia with the patient or authorized representative who has indicated his/her understanding and acceptance.     Plan Discussed with: CRNA, Anesthesiologist and Surgeon  Anesthesia Plan Comments:         Anesthesia Quick Evaluation

## 2017-02-05 NOTE — Interval H&P Note (Signed)
History and Physical Interval Note:  02/05/2017 7:22 AM  Sherri Murray  has presented today for surgery, with the diagnosis of Right Shoulder Osteoarthritis  The various methods of treatment have been discussed with the patient and family. After consideration of risks, benefits and other options for treatment, the patient has consented to  Procedure(s): RIGHT TOTAL SHOULDER ARTHROPLASTY (Right) as a surgical intervention .  The patient's history has been reviewed, patient examined, no change in status, stable for surgery.  I have reviewed the patient's chart and labs.  Questions were answered to the patient's satisfaction.     Marybelle Killings

## 2017-02-05 NOTE — Transfer of Care (Signed)
Immediate Anesthesia Transfer of Care Note  Patient: Sherri Murray  Procedure(s) Performed: Procedure(s): RIGHT TOTAL SHOULDER ARTHROPLASTY (Right)  Patient Location: PACU  Anesthesia Type:GA combined with regional for post-op pain  Level of Consciousness: awake, alert  and oriented  Airway & Oxygen Therapy: Patient Spontanous Breathing and Patient connected to nasal cannula oxygen  Post-op Assessment: Report given to RN and Post -op Vital signs reviewed and stable  Post vital signs: Reviewed and stable  Last Vitals:  Vitals:   02/05/17 0719 02/05/17 0720  BP:    Pulse: (!) 38 (!) 112  Resp: 15 (!) 23  Temp:      Last Pain:  Vitals:   02/05/17 0622  TempSrc:   PainSc: 9       Patients Stated Pain Goal: 3 (12/45/80 9983)  Complications: No apparent anesthesia complications

## 2017-02-05 NOTE — Anesthesia Postprocedure Evaluation (Signed)
Anesthesia Post Note  Patient: Sherri Murray  Procedure(s) Performed: Procedure(s) (LRB): RIGHT TOTAL SHOULDER ARTHROPLASTY (Right)  Patient location during evaluation: PACU Anesthesia Type: General Level of consciousness: awake and alert and patient cooperative Pain management: pain level controlled Vital Signs Assessment: post-procedure vital signs reviewed and stable Respiratory status: spontaneous breathing and respiratory function stable Cardiovascular status: stable Anesthetic complications: no       Last Vitals:  Vitals:   02/05/17 1130 02/05/17 1159  BP: (!) 122/54 (!) 125/56  Pulse:  100  Resp: 16   Temp: 36.4 C     Last Pain:  Vitals:   02/05/17 1030  TempSrc:   PainSc: 0-No pain                 Latoi Giraldo S
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
## 2017-02-05 NOTE — Anesthesia Procedure Notes (Signed)
Anesthesia Regional Block: Interscalene brachial plexus block   Pre-Anesthetic Checklist: ,, timeout performed, Correct Patient, Correct Site, Correct Laterality, Correct Procedure, Correct Position, site marked, Risks and benefits discussed,  Surgical consent,  Pre-op evaluation,  At surgeon's request and post-op pain management  Laterality: Right  Prep: chloraprep       Needles:  Injection technique: Single-shot  Needle Type: Echogenic Stimulator Needle     Needle Length: 5cm  Needle Gauge: 22     Additional Needles:   Procedures: ultrasound guided, nerve stimulator,,,,,,   Nerve Stimulator or Paresthesia:  Response: biceps flexion, 0.45 mA,   Additional Responses:   Narrative:  Start time: 02/05/2017 7:05 AM End time: 02/05/2017 7:15 AM Injection made incrementally with aspirations every 5 mL.  Performed by: Personally  Anesthesiologist: Dunya Meiners  Additional Notes: Functioning IV was confirmed and monitors were applied.  A 71mm 22ga Arrow echogenic stimulator needle was used. Sterile prep and drape,hand hygiene and sterile gloves were used.  Negative aspiration and negative test dose prior to incremental administration of local anesthetic. The patient tolerated the procedure well.  Ultrasound guidance: relevent anatomy identified, needle position confirmed, local anesthetic spread visualized around nerve(s), vascular puncture avoided.  Image printed for medical record.

## 2017-02-06 NOTE — Progress Notes (Addendum)
   Subjective: 1 Day Post-Op Procedure(s) (LRB): RIGHT TOTAL SHOULDER ARTHROPLASTY (Right) Patient reports pain as moderate.    Objective: Vital signs in last 24 hours: Temp:  [97.6 F (36.4 C)-98.2 F (36.8 C)] 98.2 F (36.8 C) (04/28 0657) Pulse Rate:  [53-106] 53 (04/28 0833) Resp:  [16-19] 16 (04/27 1130) BP: (99-125)/(51-71) 105/61 (04/28 0833) SpO2:  [93 %-100 %] 93 % (04/28 0657) Weight:  [155 lb (70.3 kg)] 155 lb (70.3 kg) (04/27 1300)  Intake/Output from previous day: 04/27 0701 - 04/28 0700 In: 2656.3 [P.O.:840; I.V.:1816.3] Out: 100 [Blood:100] Intake/Output this shift: Total I/O In: 240 [P.O.:240] Out: -   No results for input(s): HGB in the last 72 hours. No results for input(s): WBC, RBC, HCT, PLT in the last 72 hours. No results for input(s): NA, K, CL, CO2, BUN, CREATININE, GLUCOSE, CALCIUM in the last 72 hours. No results for input(s): LABPT, INR in the last 72 hours.  Neurologically intact, block has worn off. Dressing dry.  Dg Shoulder Right Port  Result Date: 02/05/2017 CLINICAL DATA:  Right total shoulder arthroplasty. EXAM: PORTABLE RIGHT SHOULDER COMPARISON:  CT of the right shoulder 12/17/2016 FINDINGS: Right total shoulder arthroplasty is noted. Skin staples are place. Shoulder appears located. The right hemithorax is clear. Lung volumes are low. Clavicle is intact. IMPRESSION: 1. Right total shoulder replacement without radiographic evidence for complication. Electronically Signed   By: San Morelle M.D.   On: 02/05/2017 11:24    Assessment/Plan: 1 Day Post-Op Procedure(s) (LRB): RIGHT TOTAL SHOULDER ARTHROPLASTY (Right) Plan:   Ambulate. Discharge home today . Office one week   Sherri Murray 02/06/2017, 8:59 AM

## 2017-02-06 NOTE — Progress Notes (Addendum)
Rn informed by Dr Lorin Mercy that  if patient felt comfortable to go home today then she could, otherwise she could stay until tomorrow and he would see her tomorrow morning. Patient stated that she was "nervous" about going home today and did not "feel comfortable" about being discharged today. Dr Lorin Mercy stated that he did not need to be called by RN if patient chose to stay, that he would see her in AM during rounding.   Patient did ambulate in the hallway, as requested by MD, with standby assistance from RN. Patient complained of no dizziness, only complained of pain in right shoulder.

## 2017-02-07 NOTE — Care Management Note (Signed)
Case Management Note  Patient Details  Name: Sherri Murray MRN: [id number] Date of Birth: 1941-07-23  Subjective/Objective:  76 y.o. Admitted 02/05/2017 to be discharged home today with no needs .                   Action/Plan: Anticipate discharge home today. No further CM needs but will be available should additional discharge needs arise.   Expected Discharge Date:  02/07/17               Expected Discharge Plan:  Home/Self Care  In-House Referral:  NA  Discharge planning Services  CM Consult  Post Acute Care Choice:  NA Choice offered to:  Patient  DME Arranged:  N/A DME Agency:  NA  HH Arranged:  NA HH Agency:  NA  Status of Service:  Completed, signed off  If discussed at [place] of Stay Meetings, dates discussed:    Additional Comments:  Delrae Sawyers, RN 02/07/2017, 9:48 AM

## 2017-02-07 NOTE — Progress Notes (Signed)
Informed patient of discharge orders. Patient daughter will pick up by 5 oclock today.

## 2017-02-07 NOTE — Progress Notes (Signed)
Discharging patient to home. Leaving unit via wheelchair, accompanied by transportation CNA and two daughters. Patient and family verbalized understanding of discharge instructions written and verbal. Prescription for pain medication given. Understands all follow up appointments. IV removed.

## 2017-02-07 NOTE — Progress Notes (Signed)
   Subjective: 2 Days Post-Op Procedure(s) (LRB): RIGHT TOTAL SHOULDER ARTHROPLASTY (Right) Patient reports pain as moderate.    Objective: Vital signs in last 24 hours: Temp:  [98.1 F (36.7 C)-99.6 F (37.6 C)] 99.2 F (37.3 C) (04/29 0546) Pulse Rate:  [53-73] 73 (04/29 0546) Resp:  [16-18] 16 (04/29 0546) BP: (105-139)/(58-61) 139/60 (04/29 0546) SpO2:  [93 %-95 %] 93 % (04/29 0546)  Intake/Output from previous day: 04/28 0701 - 04/29 0700 In: 720 [P.O.:720] Out: 1602 [Urine:1600; Emesis/NG output:1; Stool:1] Intake/Output this shift: No intake/output data recorded.  No results for input(s): HGB in the last 72 hours. No results for input(s): WBC, RBC, HCT, PLT in the last 72 hours. No results for input(s): NA, K, CL, CO2, BUN, CREATININE, GLUCOSE, CALCIUM in the last 72 hours. No results for input(s): LABPT, INR in the last 72 hours.  Neurologically intact No results found.  Assessment/Plan: 2 Days Post-Op Procedure(s) (LRB): RIGHT TOTAL SHOULDER ARTHROPLASTY (Right) Plan:  Discharge home. Office one week. Rx on chart.   Sherri Murray 02/07/2017, 7:13 AM

## 2017-02-08 ENCOUNTER — Telehealth (INDEPENDENT_AMBULATORY_CARE_PROVIDER_SITE_OTHER): Payer: Self-pay

## 2017-02-08 ENCOUNTER — Encounter (HOSPITAL_COMMUNITY): Payer: Self-pay | Admitting: Orthopaedic Surgery

## 2017-02-08 NOTE — Telephone Encounter (Signed)
Patient's daughter called for a 1 week follow-up appt.for Friday 02/12/2017.  Patient had RT shoulder surgery on 02/05/2017.  Her CB# is [phone].
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
## 2017-02-09 NOTE — Telephone Encounter (Signed)
I called patient to offer appt on Fri and she states someone had worked her in at 3:30pm that day. I did not see her on the schedule, but had a time slot opened and put her there.

## 2017-02-11 ENCOUNTER — Inpatient Hospital Stay (INDEPENDENT_AMBULATORY_CARE_PROVIDER_SITE_OTHER): Payer: Medicare Other | Admitting: Orthopedic Surgery

## 2017-02-12 ENCOUNTER — Ambulatory Visit (INDEPENDENT_AMBULATORY_CARE_PROVIDER_SITE_OTHER): Payer: Medicare Other | Admitting: Orthopaedic Surgery

## 2017-02-12 ENCOUNTER — Ambulatory Visit (INDEPENDENT_AMBULATORY_CARE_PROVIDER_SITE_OTHER): Payer: Medicare Other

## 2017-02-12 DIAGNOSIS — Z96611 Presence of right artificial shoulder joint: Secondary | ICD-10-CM
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
## 2017-02-16 NOTE — Addendum Note (Signed)
Addended by: Meyer Cory on: 02/16/2017 11:31 AM   Modules accepted: Orders

## 2017-02-18 ENCOUNTER — Telehealth (INDEPENDENT_AMBULATORY_CARE_PROVIDER_SITE_OTHER): Payer: Self-pay | Admitting: Radiology

## 2017-02-18 DIAGNOSIS — Z96611 Presence of right artificial shoulder joint: Secondary | ICD-10-CM

## 2017-02-18 NOTE — Telephone Encounter (Signed)
Order for PT was entered under OT. Please change to PT. Done.

## 2017-03-01 ENCOUNTER — Ambulatory Visit: Payer: Medicare Other | Attending: Orthopaedic Surgery | Admitting: Rehabilitative and Restorative Service Providers"

## 2017-03-01 DIAGNOSIS — M25611 Stiffness of right shoulder, not elsewhere classified: Secondary | ICD-10-CM | POA: Diagnosis not present

## 2017-03-01 DIAGNOSIS — M25511 Pain in right shoulder: Secondary | ICD-10-CM | POA: Insufficient documentation

## 2017-03-01 DIAGNOSIS — M6281 Muscle weakness (generalized): Secondary | ICD-10-CM | POA: Insufficient documentation
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
## 2017-03-02 ENCOUNTER — Ambulatory Visit: Payer: Medicare Other | Admitting: Physical Therapy

## 2017-03-02 ENCOUNTER — Encounter: Payer: Medicare Other | Admitting: Occupational Therapy

## 2017-03-02 ENCOUNTER — Encounter: Payer: Self-pay | Admitting: Physical Therapy

## 2017-03-02 DIAGNOSIS — M25511 Pain in right shoulder: Secondary | ICD-10-CM

## 2017-03-02 DIAGNOSIS — M6281 Muscle weakness (generalized): Secondary | ICD-10-CM | POA: Diagnosis not present

## 2017-03-02 DIAGNOSIS — M25611 Stiffness of right shoulder, not elsewhere classified: Secondary | ICD-10-CM | POA: Diagnosis not present
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
## 2017-03-03 ENCOUNTER — Encounter: Payer: Self-pay | Admitting: Physical Therapy

## 2017-03-03 ENCOUNTER — Ambulatory Visit: Payer: Medicare Other | Admitting: Physical Therapy

## 2017-03-03 DIAGNOSIS — M25511 Pain in right shoulder: Secondary | ICD-10-CM

## 2017-03-03 DIAGNOSIS — M25611 Stiffness of right shoulder, not elsewhere classified: Secondary | ICD-10-CM

## 2017-03-03 DIAGNOSIS — M6281 Muscle weakness (generalized): Secondary | ICD-10-CM | POA: Diagnosis not present

## 2017-03-03 NOTE — Patient Instructions (Signed)
May purchase a cold pack from the drugstore.
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
## 2017-03-05 ENCOUNTER — Other Ambulatory Visit (INDEPENDENT_AMBULATORY_CARE_PROVIDER_SITE_OTHER): Payer: Self-pay | Admitting: Family

## 2017-03-05 ENCOUNTER — Telehealth (INDEPENDENT_AMBULATORY_CARE_PROVIDER_SITE_OTHER): Payer: Self-pay

## 2017-03-05 MED ORDER — OXYCODONE-ACETAMINOPHEN 5-325 MG PO TABS: 1.0000 | ORAL_TABLET | Freq: Three times a day (TID) | ORAL | 0 refills | Status: DC | PRN

## 2017-03-05 NOTE — Telephone Encounter (Signed)
I called and advised script is ready for pick up.  Patient also states that physical therapist told her the bone in her arm is dead. She states that she was not told this by anyone here. She would like to know more about this. I advised Dr. Lorin Mercy is out of the office. She would like a call back on Tuesday to discuss.

## 2017-03-05 NOTE — Telephone Encounter (Signed)
Can you please advise since Dr. Lorin Mercy is out of the office? Patient had total shoulder on 02/05/2017. Received #[phone] Oxycodone post op.  Thanks.

## 2017-03-05 NOTE — Telephone Encounter (Signed)
Patient would like a Rx refill on Oxycodone.  CB# is [phone].

## 2017-03-08 NOTE — Telephone Encounter (Signed)
I called discussed. Had AVN from [facility] , usin pulley, ruler stick etc. Can brush teeth. Needs more work to be able to fix her hair.  Home therapy pulley use  discussed.

## 2017-03-09 DIAGNOSIS — R350 Frequency of micturition: Secondary | ICD-10-CM | POA: Diagnosis not present

## 2017-03-09 DIAGNOSIS — R339 Retention of urine, unspecified: Secondary | ICD-10-CM | POA: Diagnosis not present

## 2017-03-09 DIAGNOSIS — N3020 Other chronic cystitis without hematuria: Secondary | ICD-10-CM | POA: Diagnosis not present

## 2017-03-11 ENCOUNTER — Ambulatory Visit: Payer: Medicare Other | Admitting: Physical Therapy

## 2017-03-11 ENCOUNTER — Encounter: Payer: Self-pay | Admitting: Physical Therapy

## 2017-03-11 DIAGNOSIS — M25511 Pain in right shoulder: Secondary | ICD-10-CM

## 2017-03-11 DIAGNOSIS — M6281 Muscle weakness (generalized): Secondary | ICD-10-CM | POA: Diagnosis not present

## 2017-03-11 DIAGNOSIS — M25611 Stiffness of right shoulder, not elsewhere classified: Secondary | ICD-10-CM | POA: Diagnosis not present

## 2017-03-11 NOTE — Patient Instructions (Signed)
Table slides issued from exercise drawer 1-3 X a day 10 second holds For shoulder flexion, arm by side , resting on table
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
## 2017-03-15 ENCOUNTER — Ambulatory Visit: Payer: Medicare Other | Attending: Orthopaedic Surgery | Admitting: Physical Therapy

## 2017-03-15 ENCOUNTER — Telehealth: Payer: Self-pay

## 2017-03-15 DIAGNOSIS — M25611 Stiffness of right shoulder, not elsewhere classified: Secondary | ICD-10-CM

## 2017-03-15 DIAGNOSIS — M25511 Pain in right shoulder: Secondary | ICD-10-CM | POA: Insufficient documentation

## 2017-03-15 DIAGNOSIS — M19042 Primary osteoarthritis, left hand: Secondary | ICD-10-CM | POA: Diagnosis not present

## 2017-03-15 DIAGNOSIS — M6281 Muscle weakness (generalized): Secondary | ICD-10-CM | POA: Insufficient documentation

## 2017-03-15 DIAGNOSIS — M19041 Primary osteoarthritis, right hand: Secondary | ICD-10-CM | POA: Diagnosis not present

## 2017-03-15 DIAGNOSIS — M79642 Pain in left hand: Secondary | ICD-10-CM | POA: Diagnosis not present

## 2017-03-15 DIAGNOSIS — M79641 Pain in right hand: Secondary | ICD-10-CM | POA: Diagnosis not present

## 2017-03-15 DIAGNOSIS — M180 Bilateral primary osteoarthritis of first carpometacarpal joints: Secondary | ICD-10-CM | POA: Diagnosis not present

## 2017-03-15 NOTE — Telephone Encounter (Signed)
Patient came by the office to see when she could get her next venom injection. Her last shot was on 02/02/17 MV 300 mcg @ .10, what dose do you want to have next time?

## 2017-03-16 ENCOUNTER — Encounter: Payer: Self-pay | Admitting: Physical Therapy
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
## 2017-03-17 ENCOUNTER — Ambulatory Visit (INDEPENDENT_AMBULATORY_CARE_PROVIDER_SITE_OTHER): Payer: Medicare Other | Admitting: Orthopaedic Surgery

## 2017-03-17 ENCOUNTER — Encounter (INDEPENDENT_AMBULATORY_CARE_PROVIDER_SITE_OTHER): Payer: Self-pay | Admitting: Orthopaedic Surgery

## 2017-03-17 VITALS — BP 124/69 | HR 58 | Ht 63.0 in | Wt 155.0 lb

## 2017-03-17 DIAGNOSIS — Z96611 Presence of right artificial shoulder joint: Secondary | ICD-10-CM
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
## 2017-03-17 NOTE — Telephone Encounter (Signed)
I think we need to go back to the beginning and start over at this point.   Thanks, Salvatore Marvel, MD [facility] [place]

## 2017-03-18 ENCOUNTER — Encounter: Payer: Self-pay | Admitting: Physical Therapy

## 2017-03-18 ENCOUNTER — Ambulatory Visit: Payer: Medicare Other | Admitting: Physical Therapy

## 2017-03-18 DIAGNOSIS — M25611 Stiffness of right shoulder, not elsewhere classified: Secondary | ICD-10-CM | POA: Diagnosis not present

## 2017-03-18 DIAGNOSIS — M25511 Pain in right shoulder: Secondary | ICD-10-CM

## 2017-03-18 DIAGNOSIS — M6281 Muscle weakness (generalized): Secondary | ICD-10-CM

## 2017-03-18 NOTE — Telephone Encounter (Signed)
Made note in venom immunotherapy tab

## 2017-03-18 NOTE — Telephone Encounter (Signed)
Reviewed with Everlene Farrier - let's back down to the 24mcg/mL strength and start at 0.23mL.  Salvatore Marvel, MD [facility] [place]

## 2017-03-18 NOTE — Telephone Encounter (Signed)
Please clarify which strength to restart patient on?

## 2017-03-19 ENCOUNTER — Ambulatory Visit (INDEPENDENT_AMBULATORY_CARE_PROVIDER_SITE_OTHER): Payer: Medicare Other | Admitting: *Deleted

## 2017-03-19 DIAGNOSIS — T63441D Toxic effect of venom of bees, accidental (unintentional), subsequent encounter: Secondary | ICD-10-CM
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
## 2017-03-22 ENCOUNTER — Ambulatory Visit: Payer: Medicare Other | Admitting: Physical Therapy

## 2017-03-22 ENCOUNTER — Encounter: Payer: Self-pay | Admitting: Physical Therapy

## 2017-03-22 DIAGNOSIS — M25611 Stiffness of right shoulder, not elsewhere classified: Secondary | ICD-10-CM

## 2017-03-22 DIAGNOSIS — M6281 Muscle weakness (generalized): Secondary | ICD-10-CM | POA: Diagnosis not present

## 2017-03-22 DIAGNOSIS — M25511 Pain in right shoulder: Secondary | ICD-10-CM

## 2017-03-23 ENCOUNTER — Encounter: Payer: Self-pay | Admitting: Physical Therapy
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
## 2017-03-24 ENCOUNTER — Ambulatory Visit: Payer: Medicare Other | Admitting: Physical Therapy

## 2017-03-24 DIAGNOSIS — M25511 Pain in right shoulder: Secondary | ICD-10-CM | POA: Diagnosis not present

## 2017-03-24 DIAGNOSIS — M6281 Muscle weakness (generalized): Secondary | ICD-10-CM

## 2017-03-24 DIAGNOSIS — M25611 Stiffness of right shoulder, not elsewhere classified: Secondary | ICD-10-CM | POA: Diagnosis not present

## 2017-03-25 ENCOUNTER — Encounter: Payer: Self-pay | Admitting: Physical Therapy
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
## 2017-03-26 ENCOUNTER — Ambulatory Visit (INDEPENDENT_AMBULATORY_CARE_PROVIDER_SITE_OTHER): Payer: Medicare Other

## 2017-03-26 DIAGNOSIS — T63441D Toxic effect of venom of bees, accidental (unintentional), subsequent encounter: Secondary | ICD-10-CM

## 2017-03-29 ENCOUNTER — Ambulatory Visit: Payer: Medicare Other | Admitting: Physical Therapy

## 2017-03-29 ENCOUNTER — Encounter: Payer: Self-pay | Admitting: Physical Therapy

## 2017-03-29 DIAGNOSIS — M25611 Stiffness of right shoulder, not elsewhere classified: Secondary | ICD-10-CM | POA: Diagnosis not present

## 2017-03-29 DIAGNOSIS — M6281 Muscle weakness (generalized): Secondary | ICD-10-CM

## 2017-03-29 DIAGNOSIS — M25511 Pain in right shoulder: Secondary | ICD-10-CM | POA: Diagnosis not present
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
## 2017-03-31 ENCOUNTER — Ambulatory Visit: Payer: Medicare Other | Admitting: Physical Therapy

## 2017-03-31 ENCOUNTER — Ambulatory Visit: Payer: Medicare Other | Admitting: Rheumatology

## 2017-03-31 DIAGNOSIS — M6281 Muscle weakness (generalized): Secondary | ICD-10-CM | POA: Diagnosis not present

## 2017-03-31 DIAGNOSIS — M25611 Stiffness of right shoulder, not elsewhere classified: Secondary | ICD-10-CM | POA: Diagnosis not present

## 2017-03-31 DIAGNOSIS — M25511 Pain in right shoulder: Secondary | ICD-10-CM
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
## 2017-04-06 ENCOUNTER — Ambulatory Visit: Payer: Medicare Other | Admitting: Physical Therapy

## 2017-04-06 DIAGNOSIS — M6281 Muscle weakness (generalized): Secondary | ICD-10-CM | POA: Diagnosis not present

## 2017-04-06 DIAGNOSIS — M25511 Pain in right shoulder: Secondary | ICD-10-CM | POA: Diagnosis not present

## 2017-04-06 DIAGNOSIS — M25611 Stiffness of right shoulder, not elsewhere classified: Secondary | ICD-10-CM | POA: Diagnosis not present
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
## 2017-04-08 ENCOUNTER — Telehealth (INDEPENDENT_AMBULATORY_CARE_PROVIDER_SITE_OTHER): Payer: Self-pay | Admitting: Radiology

## 2017-04-08 ENCOUNTER — Ambulatory Visit: Payer: Medicare Other | Admitting: Physical Therapy

## 2017-04-08 ENCOUNTER — Encounter: Payer: Self-pay | Admitting: Physical Therapy

## 2017-04-08 DIAGNOSIS — M6281 Muscle weakness (generalized): Secondary | ICD-10-CM

## 2017-04-08 DIAGNOSIS — M25511 Pain in right shoulder: Secondary | ICD-10-CM | POA: Diagnosis not present

## 2017-04-08 DIAGNOSIS — M25611 Stiffness of right shoulder, not elsewhere classified: Secondary | ICD-10-CM

## 2017-04-08 NOTE — Telephone Encounter (Signed)
Dr Lorin Mercy, we can address tomorrow- send back to me with reply, thanks. Patient came by the office and asked for a refill oxycodone, please call her if/when ready to pickup, thanks.

## 2017-04-08 NOTE — Patient Instructions (Signed)
Extension (Isometric)    Place left bent elbow and back of arm against wall. Press elbow against wall. Hold  10 ____ seconds. Progress to pressing wrist into wall and squeezing shoulder blades together.  Repeat __10__ times. Do ____ sessions per day.  http://gt2.exer.us/112   Copyright  VHI. All rights reserved.   Also purchase a physioball to use at home at shoulder height to move arm with assistance and within comfort zone for circular motions and side to side motion as shown in clinic  Voncille Lo, PT Certified Exercise Expert for the Aging Adult  04/08/17 4:44 PM Phone: [phone] Fax: [phone]
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
## 2017-04-09 ENCOUNTER — Ambulatory Visit (INDEPENDENT_AMBULATORY_CARE_PROVIDER_SITE_OTHER): Payer: Medicare Other

## 2017-04-09 DIAGNOSIS — T63441D Toxic effect of venom of bees, accidental (unintentional), subsequent encounter: Secondary | ICD-10-CM

## 2017-04-09 NOTE — Telephone Encounter (Signed)
Call in ultram   1 po bid   Prn pain # 30.     [facility]

## 2017-04-09 NOTE — Telephone Encounter (Signed)
Called into pharmacy

## 2017-04-12 ENCOUNTER — Ambulatory Visit: Payer: Medicare Other | Attending: Orthopaedic Surgery | Admitting: Physical Therapy

## 2017-04-12 DIAGNOSIS — M25611 Stiffness of right shoulder, not elsewhere classified: Secondary | ICD-10-CM | POA: Insufficient documentation

## 2017-04-12 DIAGNOSIS — M6281 Muscle weakness (generalized): Secondary | ICD-10-CM | POA: Insufficient documentation

## 2017-04-12 DIAGNOSIS — M25511 Pain in right shoulder: Secondary | ICD-10-CM | POA: Insufficient documentation

## 2017-04-13 ENCOUNTER — Other Ambulatory Visit (INDEPENDENT_AMBULATORY_CARE_PROVIDER_SITE_OTHER): Payer: Self-pay

## 2017-04-13 ENCOUNTER — Telehealth: Payer: Self-pay | Admitting: Physical Therapy

## 2017-04-13 MED ORDER — TRAMADOL HCL 50 MG PO TABS: 50.0000 mg | ORAL_TABLET | Freq: Two times a day (BID) | ORAL | 0 refills | Status: DC
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
## 2017-04-15 ENCOUNTER — Encounter: Payer: Self-pay | Admitting: Physical Therapy

## 2017-04-15 ENCOUNTER — Ambulatory Visit: Payer: Medicare Other | Admitting: Physical Therapy

## 2017-04-15 DIAGNOSIS — M25511 Pain in right shoulder: Secondary | ICD-10-CM | POA: Diagnosis not present

## 2017-04-15 DIAGNOSIS — M6281 Muscle weakness (generalized): Secondary | ICD-10-CM | POA: Diagnosis not present

## 2017-04-15 DIAGNOSIS — M25611 Stiffness of right shoulder, not elsewhere classified: Secondary | ICD-10-CM

## 2017-04-15 NOTE — Patient Instructions (Signed)
Ball on Table: Forward / Backward    Roll ball forward and backward. Roll ball with right arm. Other arm is passive on ball. Do 20___ times, _3__ times per day.  http://ss.exer.us/211   Copyright  VHI. All rights reserved.  Ball on Table: [employer] in circles, with right arm. Other arm is passive. Circle _20__ times, clockwise then counterclockwise. Do _20__ times, _3__ times per day.  http://ss.exer.us/217   Copyright  VHI. All rights reserved.  Ball on Table: Side to Side    Roll ball side to side with right arm. Other arm is passive. Do _20__ times, _3__ times per day.  http://ss.exer.us/215   Copyright  VHI. All rights reserved.    Voncille Lo, PT Certified Exercise Expert for the Aging Adult  04/15/17 4:40 PM Phone: [phone] Fax: [phone]
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
## 2017-04-19 ENCOUNTER — Ambulatory Visit: Payer: Medicare Other | Admitting: Physical Therapy

## 2017-04-19 DIAGNOSIS — M6281 Muscle weakness (generalized): Secondary | ICD-10-CM | POA: Diagnosis not present

## 2017-04-19 DIAGNOSIS — M25511 Pain in right shoulder: Secondary | ICD-10-CM | POA: Diagnosis not present

## 2017-04-19 DIAGNOSIS — M25611 Stiffness of right shoulder, not elsewhere classified: Secondary | ICD-10-CM | POA: Diagnosis not present

## 2017-04-20 ENCOUNTER — Encounter: Payer: Self-pay | Admitting: Physical Therapy
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
## 2017-04-21 ENCOUNTER — Ambulatory Visit (INDEPENDENT_AMBULATORY_CARE_PROVIDER_SITE_OTHER): Payer: Medicare Other | Admitting: Orthopaedic Surgery

## 2017-04-21 ENCOUNTER — Encounter: Payer: Self-pay | Admitting: Physical Therapy

## 2017-04-21 ENCOUNTER — Encounter (INDEPENDENT_AMBULATORY_CARE_PROVIDER_SITE_OTHER): Payer: Self-pay | Admitting: Orthopaedic Surgery

## 2017-04-21 ENCOUNTER — Ambulatory Visit: Payer: Medicare Other | Admitting: Physical Therapy

## 2017-04-21 VITALS — BP 154/77 | HR 54

## 2017-04-21 DIAGNOSIS — M25511 Pain in right shoulder: Secondary | ICD-10-CM

## 2017-04-21 DIAGNOSIS — M6281 Muscle weakness (generalized): Secondary | ICD-10-CM | POA: Diagnosis not present

## 2017-04-21 DIAGNOSIS — M25611 Stiffness of right shoulder, not elsewhere classified: Secondary | ICD-10-CM

## 2017-04-21 DIAGNOSIS — Z96611 Presence of right artificial shoulder joint: Secondary | ICD-10-CM
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
## 2017-04-23 ENCOUNTER — Ambulatory Visit (INDEPENDENT_AMBULATORY_CARE_PROVIDER_SITE_OTHER): Payer: Medicare Other

## 2017-04-23 DIAGNOSIS — T63441D Toxic effect of venom of bees, accidental (unintentional), subsequent encounter: Secondary | ICD-10-CM
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
## 2017-04-26 ENCOUNTER — Encounter: Payer: Self-pay | Admitting: Physical Therapy

## 2017-04-26 ENCOUNTER — Ambulatory Visit: Payer: Medicare Other | Admitting: Physical Therapy

## 2017-04-26 ENCOUNTER — Telehealth (INDEPENDENT_AMBULATORY_CARE_PROVIDER_SITE_OTHER): Payer: Self-pay | Admitting: Orthopaedic Surgery

## 2017-04-26 DIAGNOSIS — M25611 Stiffness of right shoulder, not elsewhere classified: Secondary | ICD-10-CM

## 2017-04-26 DIAGNOSIS — M6281 Muscle weakness (generalized): Secondary | ICD-10-CM | POA: Diagnosis not present

## 2017-04-26 DIAGNOSIS — M25511 Pain in right shoulder: Secondary | ICD-10-CM

## 2017-04-26 NOTE — Telephone Encounter (Signed)
Yes OK to drive thanks
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
## 2017-04-26 NOTE — Telephone Encounter (Signed)
Please advise 

## 2017-04-26 NOTE — Telephone Encounter (Signed)
PT WANTS TO KNOW IF SHE CAN DRIVE NOW, PLEASE ADVISE.  [phone]

## 2017-04-26 NOTE — Telephone Encounter (Signed)
I left message advising;

## 2017-04-29 ENCOUNTER — Ambulatory Visit: Payer: Medicare Other | Admitting: Physical Therapy

## 2017-04-29 ENCOUNTER — Encounter: Payer: Self-pay | Admitting: Physical Therapy

## 2017-04-29 DIAGNOSIS — M25611 Stiffness of right shoulder, not elsewhere classified: Secondary | ICD-10-CM

## 2017-04-29 DIAGNOSIS — M6281 Muscle weakness (generalized): Secondary | ICD-10-CM | POA: Diagnosis not present

## 2017-04-29 DIAGNOSIS — M25511 Pain in right shoulder: Secondary | ICD-10-CM
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
## 2017-04-30 ENCOUNTER — Ambulatory Visit (INDEPENDENT_AMBULATORY_CARE_PROVIDER_SITE_OTHER): Payer: Medicare Other

## 2017-04-30 ENCOUNTER — Ambulatory Visit: Payer: Medicare Other | Admitting: Family Medicine

## 2017-04-30 DIAGNOSIS — T63441D Toxic effect of venom of bees, accidental (unintentional), subsequent encounter: Secondary | ICD-10-CM
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
## 2017-05-03 ENCOUNTER — Ambulatory Visit: Payer: Medicare Other | Admitting: Physical Therapy

## 2017-05-03 ENCOUNTER — Ambulatory Visit (INDEPENDENT_AMBULATORY_CARE_PROVIDER_SITE_OTHER): Payer: Medicare Other | Admitting: Rheumatology

## 2017-05-03 ENCOUNTER — Encounter: Payer: Self-pay | Admitting: Physical Therapy

## 2017-05-03 ENCOUNTER — Encounter: Payer: Self-pay | Admitting: Rheumatology

## 2017-05-03 VITALS — BP 169/78 | HR 57 | Ht 63.0 in | Wt 159.0 lb

## 2017-05-03 DIAGNOSIS — M5136 Other intervertebral disc degeneration, lumbar region: Secondary | ICD-10-CM

## 2017-05-03 DIAGNOSIS — M25611 Stiffness of right shoulder, not elsewhere classified: Secondary | ICD-10-CM | POA: Diagnosis not present

## 2017-05-03 DIAGNOSIS — I10 Essential (primary) hypertension: Secondary | ICD-10-CM | POA: Diagnosis not present

## 2017-05-03 DIAGNOSIS — R7303 Prediabetes: Secondary | ICD-10-CM | POA: Diagnosis not present

## 2017-05-03 DIAGNOSIS — M797 Fibromyalgia: Secondary | ICD-10-CM | POA: Diagnosis not present

## 2017-05-03 DIAGNOSIS — M25511 Pain in right shoulder: Secondary | ICD-10-CM | POA: Diagnosis not present

## 2017-05-03 DIAGNOSIS — Z96611 Presence of right artificial shoulder joint: Secondary | ICD-10-CM

## 2017-05-03 DIAGNOSIS — I519 Heart disease, unspecified: Secondary | ICD-10-CM

## 2017-05-03 DIAGNOSIS — K219 Gastro-esophageal reflux disease without esophagitis: Secondary | ICD-10-CM

## 2017-05-03 DIAGNOSIS — M6281 Muscle weakness (generalized): Secondary | ICD-10-CM

## 2017-05-03 DIAGNOSIS — Z96651 Presence of right artificial knee joint: Secondary | ICD-10-CM | POA: Diagnosis not present

## 2017-05-03 DIAGNOSIS — M19041 Primary osteoarthritis, right hand: Secondary | ICD-10-CM | POA: Diagnosis not present

## 2017-05-03 DIAGNOSIS — M19042 Primary osteoarthritis, left hand: Secondary | ICD-10-CM

## 2017-05-03 DIAGNOSIS — I5189 Other ill-defined heart diseases: Secondary | ICD-10-CM

## 2017-05-03 DIAGNOSIS — M51369 Other intervertebral disc degeneration, lumbar region without mention of lumbar back pain or lower extremity pain: Secondary | ICD-10-CM
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
## 2017-05-03 NOTE — Patient Instructions (Signed)
Natural anti-inflammatories  You can purchase these at [employer], [employer] or online.  . Turmeric (capsules)  . Ginger (ginger root or capsules)  . Omega 3 (Fish, flax seeds, chia seeds, walnuts, almonds)  . Tart cherry (dried or extract)   Patient should be under the care of a physician while taking these supplements. This may not be reproduced without the permission of Dr. Jermarion Poffenberger.  

## 2017-05-05 ENCOUNTER — Ambulatory Visit: Payer: Medicare Other | Admitting: Family Medicine

## 2017-05-06 ENCOUNTER — Ambulatory Visit: Payer: Medicare Other | Admitting: Physical Therapy

## 2017-05-06 ENCOUNTER — Encounter: Payer: Self-pay | Admitting: Physical Therapy

## 2017-05-06 DIAGNOSIS — M25511 Pain in right shoulder: Secondary | ICD-10-CM | POA: Diagnosis not present

## 2017-05-06 DIAGNOSIS — M6281 Muscle weakness (generalized): Secondary | ICD-10-CM | POA: Diagnosis not present

## 2017-05-06 DIAGNOSIS — M25611 Stiffness of right shoulder, not elsewhere classified: Secondary | ICD-10-CM | POA: Diagnosis not present

## 2017-05-06 NOTE — Patient Instructions (Signed)
Patient issued exercise from HEP  Chest press cane 10 X 1-2 x a day
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
## 2017-05-07 ENCOUNTER — Ambulatory Visit (INDEPENDENT_AMBULATORY_CARE_PROVIDER_SITE_OTHER): Payer: Medicare Other

## 2017-05-07 ENCOUNTER — Ambulatory Visit (INDEPENDENT_AMBULATORY_CARE_PROVIDER_SITE_OTHER): Payer: Medicare Other | Admitting: Family Medicine

## 2017-05-07 ENCOUNTER — Encounter: Payer: Self-pay | Admitting: Family Medicine

## 2017-05-07 DIAGNOSIS — M87021 Idiopathic aseptic necrosis of right humerus: Secondary | ICD-10-CM | POA: Diagnosis not present

## 2017-05-07 DIAGNOSIS — M797 Fibromyalgia: Secondary | ICD-10-CM | POA: Diagnosis not present

## 2017-05-07 DIAGNOSIS — E7800 Pure hypercholesterolemia, unspecified: Secondary | ICD-10-CM | POA: Diagnosis not present

## 2017-05-07 DIAGNOSIS — T63441D Toxic effect of venom of bees, accidental (unintentional), subsequent encounter: Secondary | ICD-10-CM

## 2017-05-07 DIAGNOSIS — I10 Essential (primary) hypertension: Secondary | ICD-10-CM | POA: Diagnosis not present

## 2017-05-07 DIAGNOSIS — K580 Irritable bowel syndrome with diarrhea: Secondary | ICD-10-CM

## 2017-05-07 MED ORDER — DICYCLOMINE HCL 10 MG PO CAPS: ORAL_CAPSULE | ORAL | 0 refills | Status: DC

## 2017-05-07 MED ORDER — ATORVASTATIN CALCIUM 40 MG PO TABS: 40.0000 mg | ORAL_TABLET | Freq: Every day | ORAL | 2 refills | Status: DC

## 2017-05-07 NOTE — Patient Instructions (Signed)
Keep working on low carb and low cholesterol diet.  Make sure not to overdo juice.  Start natural antiinflammatories as discussed for fibromyalgia.

## 2017-05-07 NOTE — Assessment & Plan Note (Signed)
S/P shoulder replacement... improving mobility with PT. PAin controlled with tylenol.

## 2017-05-07 NOTE — Progress Notes (Signed)
Subjective:    Patient ID: Sherri Murray, female    DOB: 05/06/1941, 76 y.o.   MRN: [id number]  HPI    76 year old female presents for follow up on multiple issues.  1. Hypertension:   Good control on losartan. Off HCTZ. BP Readings from Last 3 Encounters:  05/07/17 130/80  05/03/17 (!) 169/78  04/21/17 (!) 154/77  Using medication without problems or lightheadedness:  none Chest pain with exertion: none Edema: none Short of breath:none Average home BPs: Other issues:   2. Saw Dr. Estanislado Pandy for total body pain on 12/23/2016. Note reviewed in detail. 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 Severe PIP/DIP thickening but not much synovitis she also had right third MCP thickening. All autoimmune workup has been negative.  3.Adhesive capsulitis and post traumatic  severe OA  And avascular necrosis  HAD total shoulder arthroplasty 4/27 by Dr. Lorin Mercy. Now in rehab, is having increase in mobility.  Using tylenol for pain.   4. Fibromyalgia followed by Dr. Rosann Auerbach.. Last OV 01/2017 reviewed.  recommended starting turmeric, ginger, omega 3 and tart cherry  ( natural inflammation).  5. IBS: request refill of dicyclomine.    Review of Systems  Constitutional: Negative for fatigue and fever.  HENT: Negative for congestion.   Eyes: Negative for pain.  Respiratory: Negative for cough and shortness of breath.   Cardiovascular: Negative for chest pain, palpitations and leg swelling.  Gastrointestinal: Negative for abdominal pain.  Genitourinary: Negative for dysuria and vaginal bleeding.  Musculoskeletal: Positive for arthralgias, gait problem and myalgias. Negative for back pain.  Neurological: Negative for syncope, light-headedness and headaches.  Psychiatric/Behavioral: Negative for dysphoric mood.       Objective:   Physical Exam  Constitutional: Vital signs are normal. She appears well-developed and well-nourished. She is cooperative.  Non-toxic appearance.  She does not appear ill. No distress.  HENT:  Head: Normocephalic.  Right Ear: Hearing, tympanic membrane, external ear and ear canal normal. Tympanic membrane is not erythematous, not retracted and not bulging.  Left Ear: Hearing, tympanic membrane, external ear and ear canal normal. Tympanic membrane is not erythematous, not retracted and not bulging.  Nose: No mucosal edema or rhinorrhea. Right sinus exhibits no maxillary sinus tenderness and no frontal sinus tenderness. Left sinus exhibits no maxillary sinus tenderness and no frontal sinus tenderness.  Mouth/Throat: Uvula is midline, oropharynx is clear and moist and mucous membranes are normal.  Eyes: Pupils are equal, round, and reactive to light. Conjunctivae, EOM and lids are normal. Lids are everted and swept, no foreign bodies found.  Neck: Trachea normal and normal range of motion. Neck supple. Carotid bruit is not present. No thyroid mass and no thyromegaly present.  Cardiovascular: Normal rate, regular rhythm, S1 normal, S2 normal, normal heart sounds, intact distal pulses and normal pulses.  Exam reveals no gallop and no friction rub.   No murmur heard. Pulmonary/Chest: Effort normal and breath sounds normal. No tachypnea. No respiratory distress. She has no decreased breath sounds. She has no wheezes. She has no rhonchi. She has no rales.  Abdominal: Soft. Normal appearance and bowel sounds are normal. There is no tenderness.  Musculoskeletal:  Stiff and antalgic gait  Neurological: She is alert. She has normal strength. No sensory deficit.  Skin: Skin is warm, dry and intact. No rash noted.  Psychiatric: Her speech is normal and behavior is normal. Judgment and thought content normal. Her mood appears not anxious. Cognition and memory are normal. She does not  exhibit a depressed mood.          Assessment & Plan:

## 2017-05-07 NOTE — Assessment & Plan Note (Signed)
Well controlled. Continue current medication.  

## 2017-05-07 NOTE — Assessment & Plan Note (Signed)
Refilled bentyl as this helped pt greatly.

## 2017-05-07 NOTE — Assessment & Plan Note (Signed)
Refilled cholesterol medication.

## 2017-05-07 NOTE — Assessment & Plan Note (Signed)
Encouraged trial of natural antiinflammatories per Dr. Estanislado Pandy.

## 2017-05-11 DIAGNOSIS — M79675 Pain in left toe(s): Secondary | ICD-10-CM | POA: Diagnosis not present

## 2017-05-11 DIAGNOSIS — M79674 Pain in right toe(s): Secondary | ICD-10-CM | POA: Diagnosis not present

## 2017-05-11 DIAGNOSIS — B351 Tinea unguium: Secondary | ICD-10-CM | POA: Diagnosis not present

## 2017-05-14 ENCOUNTER — Ambulatory Visit (INDEPENDENT_AMBULATORY_CARE_PROVIDER_SITE_OTHER): Payer: Medicare Other

## 2017-05-14 DIAGNOSIS — T63441D Toxic effect of venom of bees, accidental (unintentional), subsequent encounter: Secondary | ICD-10-CM

## 2017-05-17 ENCOUNTER — Ambulatory Visit: Payer: Medicare Other | Attending: Orthopaedic Surgery | Admitting: Physical Therapy

## 2017-05-17 ENCOUNTER — Encounter: Payer: Self-pay | Admitting: Physical Therapy

## 2017-05-17 DIAGNOSIS — M25511 Pain in right shoulder: Secondary | ICD-10-CM

## 2017-05-17 DIAGNOSIS — M6281 Muscle weakness (generalized): Secondary | ICD-10-CM | POA: Diagnosis not present

## 2017-05-17 DIAGNOSIS — M25611 Stiffness of right shoulder, not elsewhere classified: Secondary | ICD-10-CM | POA: Insufficient documentation
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
## 2017-05-19 ENCOUNTER — Encounter: Payer: Self-pay | Admitting: Allergy & Immunology

## 2017-05-19 ENCOUNTER — Ambulatory Visit (INDEPENDENT_AMBULATORY_CARE_PROVIDER_SITE_OTHER): Payer: Medicare Other | Admitting: Allergy & Immunology

## 2017-05-19 VITALS — BP 128/70 | HR 60 | Resp 12

## 2017-05-19 DIAGNOSIS — R1310 Dysphagia, unspecified: Secondary | ICD-10-CM | POA: Diagnosis not present

## 2017-05-19 DIAGNOSIS — T63481D Toxic effect of venom of other arthropod, accidental (unintentional), subsequent encounter: Secondary | ICD-10-CM

## 2017-05-19 DIAGNOSIS — J3089 Other allergic rhinitis: Secondary | ICD-10-CM

## 2017-05-19 DIAGNOSIS — J4520 Mild intermittent asthma, uncomplicated: Secondary | ICD-10-CM

## 2017-05-19 MED ORDER — MOMETASONE FUROATE 50 MCG/ACT NA SUSP: 2.0000 | Freq: Every day | NASAL | 6 refills | Status: DC

## 2017-05-19 MED ORDER — MONTELUKAST SODIUM 10 MG PO TABS: 10.0000 mg | ORAL_TABLET | Freq: Every day | ORAL | 6 refills | Status: DC

## 2017-05-19 MED ORDER — LORATADINE 10 MG PO TABS: 10.0000 mg | ORAL_TABLET | Freq: Every day | ORAL | 6 refills | Status: DC

## 2017-05-19 MED ORDER — EPINEPHRINE 0.3 MG/0.3ML IJ SOAJ: 0.3000 mg | Freq: Once | INTRAMUSCULAR | 2 refills | Status: AC

## 2017-05-19 NOTE — Patient Instructions (Addendum)
1. Mild intermittent asthma - resolved - Continue montelukast 10mg  daily.    2. Adverse food reactions (multiple foods) - Continue to avoid all of your foods.  - New EpiPen sent in.   3. Chronic rhinitis - Continue with nasal saline as needed.  - Continue with loratadine (Claritin) 10mg  twice daily.  - Continue with Nasonex one spray per nostril once daily.   4. 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   - Continue on current schedule.   5. Return in about 6 months (around 11/19/2017).  Please inform us of any Emergency Department visits, hospitalizations, or changes in symptoms. Call us before going to the ED for breathing or allergy symptoms since we might be able to fit you in for a sick visit. Feel free to contact us anytime with any questions, problems, or concerns.  It was a pleasure to see you again today! Good luck with the rehab!   Websites that have reliable patient information: 1. American Academy of Asthma, Allergy, and Immunology: www.aaaai.org 2. Food Allergy Research and Education (FARE): foodallergy.org 3. Mothers of Asthmatics: http://www.asthmacommunitynetwork.org 4. American College of Allergy, Asthma, and Immunology: www.acaai.org

## 2017-05-19 NOTE — Progress Notes (Signed)
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 Foreign body  Plan/Recommendations:   1. Mild intermittent asthma - resolved - Continue montelukast 10mg  daily.    2. Adverse food reactions (multiple foods) - Continue to avoid all of your foods.  - New EpiPen sent in.   3. Chronic rhinitis (grasses, weeds, trees, molds) - Continue with nasal saline as needed.  - Continue with loratadine (Claritin) 10mg  twice daily.  - Continue with Nasonex one spray per nostril once daily.   4. 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   - Continue on current schedule.    5. ? foreign body in throat - Sherri Murray never actually saw a candy wrapper, but she does report that it feels like this.  - I would recommend an evaluation by an ENT provider to see whether she could be scoped. - I anticipate that this is just postnasal drip that is uncontrolled since she has been off of her medications. - It seems rather absurd that she would have swallowed a candy wrapper.  - We will refer to Dr. Lucia Gaskins or Dr. Ernesto Rutherford for evaluation.   5. Return in about 6 months (around 11/19/2017).  Subjective:   Sherri Murray is a 76 y.o. female presenting today for follow up of  Chief Complaint  Patient presents with  . Follow-up    Sherri Murray has a history of the following: Patient Active Problem List   Diagnosis Date Noted  . H/O total shoulder replacement, right 05/03/2017  . Secondary osteoarthritis of shoulder, right 02/05/2017  . DJD of right shoulder 02/05/2017  . Fibromyalgia 01/27/2017  . Primary osteoarthritis of both hands 01/27/2017  . 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 Post-traumatic osteoarthritis of right  shoulder 12/23/2016  . PAC (premature atrial contraction) 12/08/2016  . Bradycardia 11/17/2016  . Adhesive capsulitis of right shoulder 08/18/2016  . External hemorrhoids without complication 73/53/2992  . Osteoarthritis of right knee 12/25/2015  . H/O total knee replacement, right 12/11/2015  . Advanced directives, counseling/discussion 10/18/2015  . Total body pain 08/13/2015  . Diastolic dysfunction 42/68/3419  . Mild mitral regurgitation 12/20/2014  . Benign essential HTN 10/19/2014  . High cholesterol 05/01/2014  . Prediabetes 05/01/2014  . Allergic rhinitis 09/28/2013  . Mild intermittent asthma 09/28/2013  . Ischemic colitis, hx of 09/28/2013  . Hx of migraines 09/28/2013  . GERD (gastroesophageal reflux disease) 09/28/2013  . 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 Varicose veins of lower extremities with other complications 62/22/9798  . Osteoporosis screening 04/21/2013  . Incontinence of urine   . IBS (irritable bowel syndrome) 02/27/2012    History obtained from: chart review and patient.  Sherri Murray Primary Care Provider is Sherri Sanders, MD.     I last saw Sherri Murray in February 2018. She has a history of stinging insect anaphylaxis and is on immunotherapy with mixed vespids. She receives monthly injections. At the last visit, we continued her on Singulair 10 mg daily for her mild intermittent asthma. She has a history of reactions to multiple foods and she does have an EpiPen in place. For her chronic rhinitis, we continued her nasal saline as needed as well as Claritin 10 mg twice daily and Nasonex one spray per  nostril daily.  Since last visit, things have gone too well. She has a history of a right shoulder injury sustained from falling in her front yard in August 2016. After that injury, she did have a knee surgery in 2017 and then underwent a shoulder surgery in April 2018. She was hospitalized for 2-3 days. She is currently receiving physical therapy  twice weekly and is recovering well. She is having problems since they stopped giving her pain medications. She is controlling her pain now with Advil and Tylenol only.  From an asthma perspective, she has remained completely stable. She has not used albuterol in over 3 years. She has had no prednisone, ER visits, hospitalizations for her breathing. She does remain on her Singulair. However, she lost all of her medications when she was discharged from the hospital. She has been off all of her medications since April 2018. Her symptoms have taken a turn for the worse, most prominently associated with her no symptoms. She endorses daily nasal congestion with postnasal drip. Symptoms are worse around weeds, grasses, and flowers. She shares with me today that she feels that she "swallowed a candy wrapper". She does not remember seeing any wrapper and eating it, but for the past two days, she has had a feeling that she has a wrapper in her throat. She has had no increasing difficulty breathing and has had no problems with air movement through her lungs.   She is doing well with her venom immunotherapy. She is wears that it helps her nasal symptoms, although I'm never been able to explain the reason that this be the case. She has had no reactions to her immunotherapy and has had no accidental stings. Otherwise, there've been no changes to the past medical history, surgical history, family history, or social history. She lives on 2 acres in a home owned by housing in an urban development. Last fall, she was having problems with her heating and had to take out a loan to get everything fixed. She has a fixed income of approximately $800 per month. 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 She would like to restart this, however her recent injuries have kept her from resuming this work.     Review of Systems: a 14-point review of systems is pertinent  for what is mentioned in HPI.  Otherwise, all other systems were negative. Constitutional: negative other than that listed in the HPI Eyes: negative other than that listed in the HPI Ears, nose, mouth, throat, and face: negative other than that listed in the HPI Respiratory: negative other than that listed in the HPI Cardiovascular: negative other than that listed in the HPI Gastrointestinal: negative other than that listed in the HPI Genitourinary: negative other than that listed in the HPI Integument: negative other than that listed in the HPI Hematologic: negative other than that listed in the HPI Musculoskeletal: negative other than that listed in the HPI Neurological: negative other than that listed in the HPI Allergy/Immunologic: negative other than that listed in the HPI    Objective:   Blood pressure 128/70, pulse 60, resp. rate 12. There is no height or weight on file to calculate BMI.   Physical Exam:  General: Alert, interactive, in no acute distress. Somewhat quirky female.  Eyes: No conjunctival injection present on the right, No conjunctival injection present on the left, PERRL bilaterally, No discharge on the right, No discharge on the left  and No Horner-Trantas dots present Ears: Right TM pearly gray with normal light reflex, Left TM pearly gray with normal light reflex, Right TM intact without perforation and Left TM intact without perforation.  Nose/Throat: External nose within normal limits, nasal crease present and septum midline, turbinates edematous with clear discharge, post-pharynx erythematous with cobblestoning in the posterior oropharynx. Tonsils 2+ without exudates Neck: Supple without thyromegaly. Lungs: Clear to auscultation without wheezing, rhonchi or rales. No increased work of breathing. CV: Normal S1/S2, no murmurs. Capillary refill <2 seconds.  Skin: Warm and dry, without lesions or rashes. Neuro:   Grossly intact. No focal deficits appreciated.  Responsive to questions.   Diagnostic studies:   Spirometry: Spirometry: Normal FEV1, FVC, and FEV1/FVC ratio. There is no scooping suggestive of obstructive disease.    Allergy Studies: none    Salvatore Marvel, MD [facility]

## 2017-05-20 ENCOUNTER — Ambulatory Visit: Payer: Medicare Other | Admitting: Physical Therapy

## 2017-05-20 ENCOUNTER — Encounter: Payer: Self-pay | Admitting: Physical Therapy

## 2017-05-20 DIAGNOSIS — M25611 Stiffness of right shoulder, not elsewhere classified: Secondary | ICD-10-CM

## 2017-05-20 DIAGNOSIS — M6281 Muscle weakness (generalized): Secondary | ICD-10-CM | POA: Diagnosis not present

## 2017-05-20 DIAGNOSIS — M25511 Pain in right shoulder: Secondary | ICD-10-CM | POA: Diagnosis not present
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
## 2017-05-21 ENCOUNTER — Ambulatory Visit (INDEPENDENT_AMBULATORY_CARE_PROVIDER_SITE_OTHER): Payer: Medicare Other

## 2017-05-21 DIAGNOSIS — T63441D Toxic effect of venom of bees, accidental (unintentional), subsequent encounter: Secondary | ICD-10-CM

## 2017-05-24 ENCOUNTER — Telehealth: Payer: Self-pay

## 2017-05-24 ENCOUNTER — Encounter: Payer: Medicare Other | Admitting: Physical Therapy

## 2017-05-24 MED ORDER — OLOPATADINE HCL 0.2 % OP SOLN: 1.0000 [drp] | Freq: Two times a day (BID) | OPHTHALMIC | 5 refills | Status: DC | PRN

## 2017-05-24 NOTE — Addendum Note (Signed)
Addended by: Martyn Malay on: 05/24/2017 11:21 AM   Modules accepted: Orders

## 2017-05-24 NOTE — Telephone Encounter (Signed)
That is good to hear. Let's avoid the ENT referral then. Please send in Pataday one drop per eye twice daily as needed.  Thanks, Salvatore Marvel, MD [facility] [place] [facility] [place]

## 2017-05-24 NOTE — Telephone Encounter (Signed)
I called Sherri Murray and asked how she was feeling. Patient stated she feels that the wrapper has moved she really doesn't feel it any more. Patient stated that if she starts to feel it again she would give [place] a call. Patient proceed to tell me she needed some eye drops called in to the pharmacy for her itchy water eyes. What eyes drops would you like for me to call in.  Please advise

## 2017-05-24 NOTE — Telephone Encounter (Signed)
Send script. Called patient informing her that I sent the Pataday eye drops to the [facility] pharmacy in [place].

## 2017-05-26 ENCOUNTER — Ambulatory Visit: Payer: Medicare Other | Admitting: Physical Therapy

## 2017-05-26 ENCOUNTER — Encounter: Payer: Self-pay | Admitting: Physical Therapy

## 2017-05-26 DIAGNOSIS — M25611 Stiffness of right shoulder, not elsewhere classified: Secondary | ICD-10-CM

## 2017-05-26 DIAGNOSIS — M6281 Muscle weakness (generalized): Secondary | ICD-10-CM

## 2017-05-26 DIAGNOSIS — M25511 Pain in right shoulder: Secondary | ICD-10-CM | POA: Diagnosis not present

## 2017-05-26 NOTE — Patient Instructions (Signed)
Encouraged compliance with her HEP as her P. O. C. ends 06/03/2017.
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
## 2017-05-28 ENCOUNTER — Ambulatory Visit (INDEPENDENT_AMBULATORY_CARE_PROVIDER_SITE_OTHER): Payer: Medicare Other

## 2017-05-28 DIAGNOSIS — T63441D Toxic effect of venom of bees, accidental (unintentional), subsequent encounter: Secondary | ICD-10-CM

## 2017-05-31 ENCOUNTER — Ambulatory Visit: Payer: Medicare Other | Admitting: Physical Therapy

## 2017-05-31 ENCOUNTER — Encounter: Payer: Self-pay | Admitting: Physical Therapy

## 2017-05-31 DIAGNOSIS — M6281 Muscle weakness (generalized): Secondary | ICD-10-CM | POA: Diagnosis not present

## 2017-05-31 DIAGNOSIS — M25611 Stiffness of right shoulder, not elsewhere classified: Secondary | ICD-10-CM | POA: Diagnosis not present

## 2017-05-31 DIAGNOSIS — M25511 Pain in right shoulder: Secondary | ICD-10-CM | POA: Diagnosis not present
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
## 2017-06-02 ENCOUNTER — Ambulatory Visit: Payer: Medicare Other | Admitting: Physical Therapy

## 2017-06-02 ENCOUNTER — Encounter: Payer: Self-pay | Admitting: Physical Therapy

## 2017-06-02 DIAGNOSIS — M6281 Muscle weakness (generalized): Secondary | ICD-10-CM

## 2017-06-02 DIAGNOSIS — M25511 Pain in right shoulder: Secondary | ICD-10-CM

## 2017-06-02 DIAGNOSIS — M25611 Stiffness of right shoulder, not elsewhere classified: Secondary | ICD-10-CM

## 2017-06-02 NOTE — Patient Instructions (Signed)
Keep exercising!
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
## 2017-06-04 ENCOUNTER — Other Ambulatory Visit: Payer: Self-pay | Admitting: Family Medicine

## 2017-06-04 ENCOUNTER — Ambulatory Visit (INDEPENDENT_AMBULATORY_CARE_PROVIDER_SITE_OTHER): Payer: Medicare Other

## 2017-06-04 DIAGNOSIS — T63441D Toxic effect of venom of bees, accidental (unintentional), subsequent encounter: Secondary | ICD-10-CM

## 2017-06-04 NOTE — Telephone Encounter (Signed)
Last office visit 05/07/2017.  Last refilled 05/07/2017 for #120 with no refills.  Ok to refill?

## 2017-06-07 ENCOUNTER — Ambulatory Visit: Payer: Medicare Other | Admitting: Physical Therapy

## 2017-06-09 ENCOUNTER — Encounter: Payer: Medicare Other | Admitting: Physical Therapy

## 2017-06-11 ENCOUNTER — Ambulatory Visit (INDEPENDENT_AMBULATORY_CARE_PROVIDER_SITE_OTHER): Payer: Medicare Other

## 2017-06-11 DIAGNOSIS — T63441D Toxic effect of venom of bees, accidental (unintentional), subsequent encounter: Secondary | ICD-10-CM

## 2017-06-16 ENCOUNTER — Other Ambulatory Visit: Payer: Self-pay | Admitting: Family Medicine

## 2017-06-16 DIAGNOSIS — M19041 Primary osteoarthritis, right hand: Secondary | ICD-10-CM | POA: Diagnosis not present

## 2017-06-16 DIAGNOSIS — M180 Bilateral primary osteoarthritis of first carpometacarpal joints: Secondary | ICD-10-CM | POA: Diagnosis not present

## 2017-06-16 DIAGNOSIS — M19042 Primary osteoarthritis, left hand: Secondary | ICD-10-CM | POA: Diagnosis not present

## 2017-06-18 ENCOUNTER — Ambulatory Visit (INDEPENDENT_AMBULATORY_CARE_PROVIDER_SITE_OTHER): Payer: Medicare Other

## 2017-06-18 DIAGNOSIS — T63441D Toxic effect of venom of bees, accidental (unintentional), subsequent encounter: Secondary | ICD-10-CM

## 2017-06-23 ENCOUNTER — Encounter (INDEPENDENT_AMBULATORY_CARE_PROVIDER_SITE_OTHER): Payer: Self-pay | Admitting: Orthopaedic Surgery

## 2017-06-23 ENCOUNTER — Ambulatory Visit (INDEPENDENT_AMBULATORY_CARE_PROVIDER_SITE_OTHER): Payer: Medicare Other

## 2017-06-23 ENCOUNTER — Ambulatory Visit (INDEPENDENT_AMBULATORY_CARE_PROVIDER_SITE_OTHER): Payer: Medicare Other | Admitting: Orthopaedic Surgery

## 2017-06-23 VITALS — BP 138/80 | HR 55

## 2017-06-23 DIAGNOSIS — Z96611 Presence of right artificial shoulder joint: Secondary | ICD-10-CM | POA: Diagnosis not present
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
## 2017-07-01 ENCOUNTER — Ambulatory Visit (INDEPENDENT_AMBULATORY_CARE_PROVIDER_SITE_OTHER): Payer: Medicare Other | Admitting: *Deleted

## 2017-07-01 DIAGNOSIS — T63441D Toxic effect of venom of bees, accidental (unintentional), subsequent encounter: Secondary | ICD-10-CM

## 2017-07-12 ENCOUNTER — Other Ambulatory Visit: Payer: Self-pay | Admitting: *Deleted

## 2017-07-12 NOTE — Telephone Encounter (Signed)
Last office visit 05/07/2017.  Last refilled 06/04/2017 for #120 with no refills.  Ok to refill?

## 2017-07-13 MED ORDER — DICYCLOMINE HCL 10 MG PO CAPS: ORAL_CAPSULE | ORAL | 3 refills | Status: DC

## 2017-07-20 ENCOUNTER — Ambulatory Visit (INDEPENDENT_AMBULATORY_CARE_PROVIDER_SITE_OTHER): Payer: Medicare Other

## 2017-07-20 DIAGNOSIS — T63441D Toxic effect of venom of bees, accidental (unintentional), subsequent encounter: Secondary | ICD-10-CM | POA: Diagnosis not present

## 2017-07-28 ENCOUNTER — Other Ambulatory Visit: Payer: Self-pay | Admitting: Allergy & Immunology

## 2017-07-28 MED ORDER — OLOPATADINE HCL 0.2 % OP SOLN: 1.0000 [drp] | Freq: Two times a day (BID) | OPHTHALMIC | 1 refills | Status: DC | PRN

## 2017-07-28 NOTE — Telephone Encounter (Signed)
Received fax for 90 day supply for Olopatadine HCL 0.2%. Patient was last seen 05/19/2017. Refills sent.

## 2017-07-30 ENCOUNTER — Ambulatory Visit (INDEPENDENT_AMBULATORY_CARE_PROVIDER_SITE_OTHER): Payer: Medicare Other

## 2017-07-30 DIAGNOSIS — T63441D Toxic effect of venom of bees, accidental (unintentional), subsequent encounter: Secondary | ICD-10-CM | POA: Diagnosis not present

## 2017-08-10 ENCOUNTER — Ambulatory Visit (INDEPENDENT_AMBULATORY_CARE_PROVIDER_SITE_OTHER): Payer: Medicare Other | Admitting: *Deleted

## 2017-08-10 DIAGNOSIS — T63441D Toxic effect of venom of bees, accidental (unintentional), subsequent encounter: Secondary | ICD-10-CM

## 2017-08-23 ENCOUNTER — Other Ambulatory Visit: Payer: Self-pay | Admitting: Allergy & Immunology

## 2017-08-24 ENCOUNTER — Ambulatory Visit (INDEPENDENT_AMBULATORY_CARE_PROVIDER_SITE_OTHER): Payer: Medicare Other | Admitting: *Deleted

## 2017-08-24 ENCOUNTER — Other Ambulatory Visit: Payer: Self-pay | Admitting: Family Medicine

## 2017-08-24 DIAGNOSIS — T63441D Toxic effect of venom of bees, accidental (unintentional), subsequent encounter: Secondary | ICD-10-CM | POA: Diagnosis not present

## 2017-08-24 NOTE — Telephone Encounter (Signed)
Last office visit 04/17/2017.  Niacin is not on current medication list.  Refill?

## 2017-09-13 DIAGNOSIS — M79674 Pain in right toe(s): Secondary | ICD-10-CM | POA: Diagnosis not present

## 2017-09-13 DIAGNOSIS — B351 Tinea unguium: Secondary | ICD-10-CM | POA: Diagnosis not present

## 2017-09-13 DIAGNOSIS — M79675 Pain in left toe(s): Secondary | ICD-10-CM | POA: Diagnosis not present

## 2017-09-14 ENCOUNTER — Ambulatory Visit (INDEPENDENT_AMBULATORY_CARE_PROVIDER_SITE_OTHER): Payer: Medicare Other | Admitting: *Deleted

## 2017-09-14 DIAGNOSIS — T63441D Toxic effect of venom of bees, accidental (unintentional), subsequent encounter: Secondary | ICD-10-CM

## 2017-09-15 ENCOUNTER — Other Ambulatory Visit: Payer: Self-pay | Admitting: *Deleted

## 2017-09-15 DIAGNOSIS — M79642 Pain in left hand: Secondary | ICD-10-CM | POA: Diagnosis not present

## 2017-09-15 DIAGNOSIS — M180 Bilateral primary osteoarthritis of first carpometacarpal joints: Secondary | ICD-10-CM | POA: Diagnosis not present

## 2017-09-15 DIAGNOSIS — M19042 Primary osteoarthritis, left hand: Secondary | ICD-10-CM | POA: Diagnosis not present

## 2017-09-15 DIAGNOSIS — M19041 Primary osteoarthritis, right hand: Secondary | ICD-10-CM | POA: Diagnosis not present

## 2017-09-15 DIAGNOSIS — M79641 Pain in right hand: Secondary | ICD-10-CM | POA: Diagnosis not present

## 2017-09-15 MED ORDER — DICYCLOMINE HCL 10 MG PO CAPS: ORAL_CAPSULE | ORAL | 0 refills | Status: DC

## 2017-09-15 NOTE — Telephone Encounter (Signed)
Received fax from [facility] requesting 90 day supply.

## 2017-10-13 ENCOUNTER — Ambulatory Visit (INDEPENDENT_AMBULATORY_CARE_PROVIDER_SITE_OTHER): Payer: Medicare Other | Admitting: *Deleted

## 2017-10-13 DIAGNOSIS — T63441D Toxic effect of venom of bees, accidental (unintentional), subsequent encounter: Secondary | ICD-10-CM | POA: Diagnosis not present

## 2017-11-02 ENCOUNTER — Telehealth: Payer: Self-pay | Admitting: Family Medicine

## 2017-11-02 DIAGNOSIS — E7800 Pure hypercholesterolemia, unspecified: Secondary | ICD-10-CM

## 2017-11-02 DIAGNOSIS — R7303 Prediabetes: Secondary | ICD-10-CM

## 2017-11-02 NOTE — Telephone Encounter (Signed)
-----   Message from Eustace Pen, LPN sent at 04/10/1600  2:59 PM EST ----- Regarding: Labs 1/22 Lab orders needed. Thank you.  Insurance:  [facility]

## 2017-11-08 ENCOUNTER — Encounter: Payer: Self-pay | Admitting: Family Medicine

## 2017-11-08 ENCOUNTER — Ambulatory Visit (INDEPENDENT_AMBULATORY_CARE_PROVIDER_SITE_OTHER): Payer: Medicare Other

## 2017-11-08 ENCOUNTER — Ambulatory Visit: Payer: Medicare Other

## 2017-11-08 ENCOUNTER — Ambulatory Visit (INDEPENDENT_AMBULATORY_CARE_PROVIDER_SITE_OTHER): Payer: Medicare Other | Admitting: Family Medicine

## 2017-11-08 VITALS — BP 130/80 | HR 64 | Temp 98.8°F | Wt 156.5 lb

## 2017-11-08 VITALS — BP 136/80 | HR 65 | Temp 98.7°F | Ht 63.0 in | Wt 155.2 lb

## 2017-11-08 DIAGNOSIS — K580 Irritable bowel syndrome with diarrhea: Secondary | ICD-10-CM

## 2017-11-08 DIAGNOSIS — B379 Candidiasis, unspecified: Secondary | ICD-10-CM | POA: Diagnosis not present

## 2017-11-08 DIAGNOSIS — E7800 Pure hypercholesterolemia, unspecified: Secondary | ICD-10-CM

## 2017-11-08 DIAGNOSIS — R159 Full incontinence of feces: Secondary | ICD-10-CM | POA: Diagnosis not present

## 2017-11-08 DIAGNOSIS — R7303 Prediabetes: Secondary | ICD-10-CM | POA: Diagnosis not present

## 2017-11-08 DIAGNOSIS — Z0000 Encounter for general adult medical examination without abnormal findings: Secondary | ICD-10-CM | POA: Diagnosis not present

## 2017-11-08 LAB — COMPREHENSIVE METABOLIC PANEL
ALT: 12 U/L (ref 0–35)
AST: 21 U/L (ref 0–37)
Albumin: 4.4 g/dL (ref 3.5–5.2)
Alkaline Phosphatase: 57 U/L (ref 39–117)
BILIRUBIN TOTAL: 0.5 mg/dL (ref 0.2–1.2)
BUN: 15 mg/dL (ref 6–23)
CO2: 26 mEq/L (ref 19–32)
Calcium: 9.4 mg/dL (ref 8.4–10.5)
Chloride: 106 mEq/L (ref 96–112)
Creatinine, Ser: 0.76 mg/dL (ref 0.40–1.20)
GFR: 78.32 mL/min (ref >60.00)
GLUCOSE: 106 mg/dL — AB (ref 70–99)
POTASSIUM: 4.2 meq/L (ref 3.5–5.1)
Sodium: 140 mEq/L (ref 135–145)
TOTAL PROTEIN: 7.5 g/dL (ref 6.0–8.3)

## 2017-11-08 LAB — LIPID PANEL
CHOLESTEROL: 169 mg/dL (ref 0–200)
HDL: 59.3 mg/dL (ref >39.00)
LDL Cholesterol: 93 mg/dL (ref 0–99)
NONHDL: 109.44
Total CHOL/HDL Ratio: 3
Triglycerides: 81 mg/dL (ref 0.0–149.0)
VLDL: 16.2 mg/dL (ref 0.0–40.0)

## 2017-11-08 LAB — HEMOGLOBIN A1C: HEMOGLOBIN A1C: 5.8 % (ref 4.6–6.5)

## 2017-11-08 MED ORDER — NYSTATIN 100000 UNIT/GM EX CREA: 1.0000 "application " | TOPICAL_CREAM | Freq: Two times a day (BID) | CUTANEOUS | 0 refills | Status: DC
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
## 2017-11-08 NOTE — Progress Notes (Signed)
Pre visit review using our clinic review tool, if applicable. No additional management support is needed unless otherwise documented below in the visit note. 
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
## 2017-11-08 NOTE — Patient Instructions (Signed)
Look on line for cotton or natural fibers pads or underwear  I will put a referral in for you to go back to GI

## 2017-11-08 NOTE — Patient Instructions (Signed)
Sherri Murray , Thank you for taking time to come for your Medicare Wellness Visit. I appreciate your ongoing commitment to your health goals. Please review the following plan we discussed and let me know if I can assist you in the future.   These are the goals we discussed: Goals    . Follow up with Primary Care Provider     Starting 11/08/2017, I will continue to take medications as prescribed and to keep appointment with PCP as scheduled.        This is a list of the screening recommended for you and due dates:  Health Maintenance  Topic Date Due  . Tetanus Vaccine  10/12/2020  . Flu Shot  Completed  . DEXA scan (bone density measurement)  Completed  . Pneumonia vaccines  Completed   Preventive Care for Adults  A healthy lifestyle and preventive care can promote health and wellness. Preventive health guidelines for adults include the following key practices.  . A routine yearly physical is a good way to check with your health care provider about your health and preventive screening. It is a chance to share any concerns and updates on your health and to receive a thorough exam.  . Visit your dentist for a routine exam and preventive care every 6 months. Brush your teeth twice a day and floss once a day. Good oral hygiene prevents tooth decay and gum disease.  . The frequency of eye exams is based on your age, health, family medical history, use  of contact lenses, and other factors. Follow your health care provider's recommendations for frequency of eye exams.  . Eat a healthy diet. Foods like vegetables, fruits, whole grains, low-fat dairy products, and lean protein foods contain the nutrients you need without too many calories. Decrease your intake of foods high in solid fats, added sugars, and salt. Eat the right amount of calories for you. Get information about a proper diet from your health care provider, if necessary.  . Regular physical exercise is one of the most important  things you can do for your health. Most adults should get at least 150 minutes of moderate-intensity exercise (any activity that increases your heart rate and causes you to sweat) each week. In addition, most adults need muscle-strengthening exercises on 2 or more days a week.  Silver Sneakers may be a benefit available to you. To determine eligibility, you may visit the website: www.silversneakers.com or contact program at 754-065-8798 Mon-Fri between 8AM-8PM.   . Maintain a healthy weight. The body mass index (BMI) is a screening tool to identify possible weight problems. It provides an estimate of body fat based on height and weight. Your health care provider can find your BMI and can help you achieve or maintain a healthy weight.   For adults 20 years and older: ? A BMI below 18.5 is considered underweight. ? A BMI of 18.5 to 24.9 is normal. ? A BMI of 25 to 29.9 is considered overweight. ? A BMI of 30 and above is considered obese.   . Maintain normal blood lipids and cholesterol levels by exercising and minimizing your intake of saturated fat. Eat a balanced diet with plenty of fruit and vegetables. Blood tests for lipids and cholesterol should begin at age 2 and be repeated every 5 years. If your lipid or cholesterol levels are high, you are over 50, or you are at high risk for heart disease, you may need your cholesterol levels checked more frequently. Ongoing high lipid  and cholesterol levels should be treated with medicines if diet and exercise are not working.  . If you smoke, find out from your health care provider how to quit. If you do not use tobacco, please do not start.  . If you choose to drink alcohol, please do not consume more than 2 drinks per day. One drink is considered to be 12 ounces (355 mL) of beer, 5 ounces (148 mL) of wine, or 1.5 ounces (44 mL) of liquor.  . If you are 54-4 years old, ask your health care provider if you should take aspirin to prevent  strokes.  . Use sunscreen. Apply sunscreen liberally and repeatedly throughout the day. You should seek shade when your shadow is shorter than you. Protect yourself by wearing long sleeves, pants, a wide-brimmed hat, and sunglasses year round, whenever you are outdoors.  . Once a month, do a whole body skin exam, using a mirror to look at the skin on your back. Tell your health care provider of new moles, moles that have irregular borders, moles that are larger than a pencil eraser, or moles that have changed in shape or color.

## 2017-11-11 ENCOUNTER — Other Ambulatory Visit: Payer: Self-pay | Admitting: Family Medicine

## 2017-11-11 ENCOUNTER — Encounter: Payer: Self-pay | Admitting: *Deleted

## 2017-11-11 DIAGNOSIS — Z1231 Encounter for screening mammogram for malignant neoplasm of breast: Secondary | ICD-10-CM

## 2017-11-12 ENCOUNTER — Ambulatory Visit (INDEPENDENT_AMBULATORY_CARE_PROVIDER_SITE_OTHER): Payer: Medicare Other

## 2017-11-12 ENCOUNTER — Encounter: Payer: Self-pay | Admitting: Family Medicine

## 2017-11-12 ENCOUNTER — Ambulatory Visit (INDEPENDENT_AMBULATORY_CARE_PROVIDER_SITE_OTHER): Payer: Medicare Other | Admitting: Family Medicine

## 2017-11-12 VITALS — BP 140/72 | HR 53 | Temp 97.7°F | Ht 63.0 in | Wt 156.5 lb

## 2017-11-12 DIAGNOSIS — B379 Candidiasis, unspecified: Secondary | ICD-10-CM | POA: Insufficient documentation

## 2017-11-12 DIAGNOSIS — M797 Fibromyalgia: Secondary | ICD-10-CM | POA: Diagnosis not present

## 2017-11-12 DIAGNOSIS — T63441D Toxic effect of venom of bees, accidental (unintentional), subsequent encounter: Secondary | ICD-10-CM | POA: Diagnosis not present

## 2017-11-12 DIAGNOSIS — R7303 Prediabetes: Secondary | ICD-10-CM | POA: Diagnosis not present

## 2017-11-12 DIAGNOSIS — I10 Essential (primary) hypertension: Secondary | ICD-10-CM

## 2017-11-12 DIAGNOSIS — E7800 Pure hypercholesterolemia, unspecified: Secondary | ICD-10-CM

## 2017-11-12 DIAGNOSIS — Z0001 Encounter for general adult medical examination with abnormal findings: Secondary | ICD-10-CM

## 2017-11-12 DIAGNOSIS — Z0000 Encounter for general adult medical examination without abnormal findings: Secondary | ICD-10-CM

## 2017-11-12 MED ORDER — NYSTATIN 100000 UNIT/GM EX CREA: 1.0000 "application " | TOPICAL_CREAM | Freq: Two times a day (BID) | CUTANEOUS | 0 refills | Status: DC

## 2017-11-12 NOTE — Assessment & Plan Note (Signed)
Encouraged exercise, weight loss, healthy eating habits. Low carb diet. 

## 2017-11-12 NOTE — Assessment & Plan Note (Signed)
Well controlled. Continue current medication.  

## 2017-11-12 NOTE — Patient Instructions (Addendum)
Given nystatin  More time to work.  Keep appt with Dr. Carlean Purl as planned.  Work on increased exercise and low carbohydrate/low sweet diet.
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
## 2017-11-12 NOTE — Progress Notes (Signed)
I reviewed health advisor's note, was available for consultation, and agree with documentation and plan.  

## 2017-11-12 NOTE — Assessment & Plan Note (Signed)
Good control on statin.  No SE. 

## 2017-11-12 NOTE — Assessment & Plan Note (Signed)
Continue nystatin as directed and change pad brands if able.

## 2017-11-12 NOTE — Assessment & Plan Note (Signed)
Tolerable followed by Dr. Estanislado Pandy.

## 2017-12-10 ENCOUNTER — Ambulatory Visit (INDEPENDENT_AMBULATORY_CARE_PROVIDER_SITE_OTHER): Payer: Medicare Other | Admitting: *Deleted

## 2017-12-10 DIAGNOSIS — T63441D Toxic effect of venom of bees, accidental (unintentional), subsequent encounter: Secondary | ICD-10-CM

## 2017-12-13 DIAGNOSIS — B351 Tinea unguium: Secondary | ICD-10-CM | POA: Diagnosis not present

## 2017-12-13 DIAGNOSIS — M79674 Pain in right toe(s): Secondary | ICD-10-CM | POA: Diagnosis not present

## 2017-12-13 DIAGNOSIS — M79675 Pain in left toe(s): Secondary | ICD-10-CM | POA: Diagnosis not present

## 2017-12-14 ENCOUNTER — Ambulatory Visit
Admission: RE | Admit: 2017-12-14 | Discharge: 2017-12-14 | Disposition: A | Discharge status: Home / Self Care | Payer: Medicare Other | Source: Ambulatory Visit | Attending: Family Medicine | Admitting: Family Medicine

## 2017-12-14 DIAGNOSIS — Z1231 Encounter for screening mammogram for malignant neoplasm of breast: Secondary | ICD-10-CM | POA: Diagnosis not present

## 2017-12-21 DIAGNOSIS — H0011 Chalazion right upper eyelid: Secondary | ICD-10-CM | POA: Diagnosis not present

## 2017-12-23 ENCOUNTER — Other Ambulatory Visit: Payer: Self-pay | Admitting: *Deleted

## 2017-12-23 NOTE — Telephone Encounter (Signed)
Last office visit 1941-09-26.  Last refilled 09/15/2017 for #360 with no refills.  Ok to refill?

## 2017-12-24 MED ORDER — DICYCLOMINE HCL 10 MG PO CAPS: ORAL_CAPSULE | ORAL | 0 refills | Status: DC

## 2017-12-27 ENCOUNTER — Other Ambulatory Visit: Payer: Self-pay | Admitting: Allergy & Immunology

## 2018-01-03 ENCOUNTER — Other Ambulatory Visit (INDEPENDENT_AMBULATORY_CARE_PROVIDER_SITE_OTHER): Payer: Medicare Other

## 2018-01-03 ENCOUNTER — Encounter: Payer: Self-pay | Admitting: Internal Medicine

## 2018-01-03 ENCOUNTER — Ambulatory Visit (INDEPENDENT_AMBULATORY_CARE_PROVIDER_SITE_OTHER): Payer: Medicare Other | Admitting: Internal Medicine

## 2018-01-03 VITALS — BP 110/58 | HR 70 | Ht 63.0 in | Wt 153.1 lb

## 2018-01-03 DIAGNOSIS — K625 Hemorrhage of anus and rectum: Secondary | ICD-10-CM | POA: Diagnosis not present

## 2018-01-03 DIAGNOSIS — R159 Full incontinence of feces: Secondary | ICD-10-CM

## 2018-01-03 DIAGNOSIS — N8189 Other female genital prolapse: Secondary | ICD-10-CM

## 2018-01-03 DIAGNOSIS — K6289 Other specified diseases of anus and rectum: Secondary | ICD-10-CM

## 2018-01-03 DIAGNOSIS — R32 Unspecified urinary incontinence: Secondary | ICD-10-CM | POA: Diagnosis not present

## 2018-01-03 DIAGNOSIS — K580 Irritable bowel syndrome with diarrhea: Secondary | ICD-10-CM | POA: Diagnosis not present

## 2018-01-03 LAB — CBC WITH DIFFERENTIAL/PLATELET
BASOS ABS: 0.1 10*3/uL (ref 0.0–0.1)
BASOS PCT: 0.9 % (ref 0.0–3.0)
Eosinophils Absolute: 0.1 10*3/uL (ref 0.0–0.7)
Eosinophils Relative: 1.2 % (ref 0.0–5.0)
HCT: 38.2 % (ref 36.0–46.0)
Hemoglobin: 13 g/dL (ref 12.0–15.0)
LYMPHS ABS: 3.1 10*3/uL (ref 0.7–4.0)
Lymphocytes Relative: 41.1 % (ref 12.0–46.0)
MCHC: 33.9 g/dL (ref 30.0–36.0)
MCV: 93.2 fl (ref 78.0–100.0)
Monocytes Absolute: 0.7 10*3/uL (ref 0.1–1.0)
Monocytes Relative: 9.3 % (ref 3.0–12.0)
NEUTROS ABS: 3.5 10*3/uL (ref 1.4–7.7)
NEUTROS PCT: 47.5 % (ref 43.0–77.0)
PLATELETS: 269 10*3/uL (ref 150.0–400.0)
RBC: 4.1 Mil/uL (ref 3.87–5.11)
RDW: 13.1 % (ref 11.5–15.5)
WBC: 7.4 10*3/uL (ref 4.0–10.5)

## 2018-01-03 MED ORDER — DICYCLOMINE HCL 20 MG PO TABS: 20.0000 mg | ORAL_TABLET | Freq: Three times a day (TID) | ORAL | 3 refills | Status: DC
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
## 2018-01-03 NOTE — Patient Instructions (Signed)
Stop your Flax seed.  We have sent the following medications to your pharmacy for you to pick up at your convenience: Dicyclomine 20mg  to replace the 10mg  tablets you have.  Your physician has requested that you go to the basement for the following lab work before leaving today: CBC/diff  You will be contacted about sitting up Pelvic Floor Therapy.   Contact your ortho Dr about your right shoulder pain.   I appreciate the opportunity to care for you. Silvano Rusk, MD, [facility]

## 2018-01-04 NOTE — Progress Notes (Signed)
Let her know she is not anemic  Good news!  Tell her I will wait to see how she does with pelvic floor PT before recommending follow-up

## 2018-01-07 ENCOUNTER — Ambulatory Visit (INDEPENDENT_AMBULATORY_CARE_PROVIDER_SITE_OTHER): Payer: Medicare Other

## 2018-01-07 ENCOUNTER — Other Ambulatory Visit: Payer: Self-pay | Admitting: *Deleted

## 2018-01-07 ENCOUNTER — Ambulatory Visit: Payer: Medicare Other

## 2018-01-07 DIAGNOSIS — T63441D Toxic effect of venom of bees, accidental (unintentional), subsequent encounter: Secondary | ICD-10-CM | POA: Diagnosis not present

## 2018-01-07 MED ORDER — MELOXICAM 15 MG PO TABS: 15.0000 mg | ORAL_TABLET | Freq: Every day | ORAL | 0 refills | Status: DC

## 2018-01-07 NOTE — Telephone Encounter (Signed)
Received fax from [facility] requesting refill for Meloxicam 15 mg.  Not on current medication list.  Refill?

## 2018-02-02 ENCOUNTER — Other Ambulatory Visit: Payer: Self-pay | Admitting: Allergy & Immunology

## 2018-02-02 NOTE — Telephone Encounter (Signed)
Courtesy refill  

## 2018-02-04 ENCOUNTER — Ambulatory Visit: Payer: Self-pay

## 2018-02-07 ENCOUNTER — Ambulatory Visit: Payer: Medicare Other | Attending: Internal Medicine | Admitting: Physical Therapy

## 2018-02-07 DIAGNOSIS — M25611 Stiffness of right shoulder, not elsewhere classified: Secondary | ICD-10-CM | POA: Diagnosis not present

## 2018-02-07 DIAGNOSIS — M25511 Pain in right shoulder: Secondary | ICD-10-CM

## 2018-02-07 DIAGNOSIS — M6281 Muscle weakness (generalized): Secondary | ICD-10-CM | POA: Insufficient documentation

## 2018-02-07 DIAGNOSIS — M4126 Other idiopathic scoliosis, lumbar region: Secondary | ICD-10-CM | POA: Diagnosis not present

## 2018-02-07 NOTE — Patient Instructions (Addendum)
  Avoid straining pelvic floor, abdominal muscles , spine  Use log rolling technique instead of getting out of bed with your neck or the sit-up   Log rolling out of .bed  L  arm overhead  Raise hips and scoot hips to R   Drop knees to L,  scooting L shoulder back to get completely on your L side so your shoulders, hips, and knees point to the L    Then breathe as you drop feet off bed and prop onto L elbow and  use both hands to push yourself      Proper body mechanics with getting out of a chair to decrease strain  on back &pelvic floor   Avoid holding your breath when Getting out of the chair:  Scoot to front part of chair chair Heels behind feet, feet are hip width apart, nose over toes  Inhale like you are smelling roses Exhale to stand       _______   Joie Bimler 45 Degrees   Lying with hips and knees bent 45, one pillow between knees and ankles. Lift knee with exhale. Be sure pelvis does not roll backward. Do not arch back. Do 10 times, each leg, 2 times per day.  http://ss.exer.us/75   Copyright  VHI. All rights reserved.  ___   Minisquat: Scoot buttocks back slight, hinge like you are looking at your reflection on a pond  Knees behind toes,  Inhale to "smell flowers"  Exhale on the rise "like rocket"  Do not lock knees, have more weight across ballmounds of feet, toes relaxed   10 reps x 3 x day

## 2018-02-08 ENCOUNTER — Other Ambulatory Visit: Payer: Self-pay
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
## 2018-02-14 ENCOUNTER — Ambulatory Visit: Payer: Medicare Other | Admitting: Physical Therapy

## 2018-02-16 ENCOUNTER — Ambulatory Visit: Payer: Medicare Other | Attending: Internal Medicine | Admitting: Physical Therapy

## 2018-02-16 DIAGNOSIS — M25511 Pain in right shoulder: Secondary | ICD-10-CM | POA: Diagnosis not present

## 2018-02-16 DIAGNOSIS — M25611 Stiffness of right shoulder, not elsewhere classified: Secondary | ICD-10-CM | POA: Insufficient documentation

## 2018-02-16 DIAGNOSIS — R278 Other lack of coordination: Secondary | ICD-10-CM | POA: Insufficient documentation

## 2018-02-16 DIAGNOSIS — M6281 Muscle weakness (generalized): Secondary | ICD-10-CM | POA: Diagnosis not present

## 2018-02-16 DIAGNOSIS — M4126 Other idiopathic scoliosis, lumbar region: Secondary | ICD-10-CM | POA: Insufficient documentation
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
## 2018-02-16 NOTE — Patient Instructions (Addendum)
You are now ready to begin training the deep core muscles system: diaphragm, transverse abdominis, pelvic floor . These muscles must work together as a team.           The key to these exercises to train the brain to coordinate the timing of these muscles and to have them turn on for long periods of time to hold you upright against gravity (especially important if you are on your feet all day).These muscles are postural muscles and play a role stabilizing your spine and bodyweight. By doing these repetitions slowly and correctly instead of doing crunches, you will achieve a flatter belly without a lower pooch. You are also placing your spine in a more neutral position and breathing properly which in turn, decreases your risk for problems related to your pelvic floor, abdominal, and low back such as pelvic organ prolapse, hernias, diastasis recti (separation of superficial muscles), disk herniations, spinal fractures. These exercises set a solid foundation for you to later progress to resistance/ strength training with therabands and weights and return to other typical fitness exercises with a stronger deeper core.    Do level 1 : 10 reps to a count of inhale 1-2 pause, exhale 2-1 pause to minimize hyperventilation   ( see handout)    _________  Stretch for pelvic floor   "v heels slide away and then back toward buttocks and then rock knee to slight ,  slide heel along at 11 o clock away from buttocks   10 reps   ________  Body scan ( mindfulness technique) for relaxation

## 2018-02-17 ENCOUNTER — Other Ambulatory Visit: Payer: Self-pay | Admitting: Family Medicine

## 2018-02-17 DIAGNOSIS — B379 Candidiasis, unspecified: Secondary | ICD-10-CM

## 2018-02-17 NOTE — Telephone Encounter (Signed)
Last office visit 11/12/2017.  Last refilled 11/12/2017 for 30 g with no refills.  Ok to refill?

## 2018-02-18 ENCOUNTER — Ambulatory Visit (INDEPENDENT_AMBULATORY_CARE_PROVIDER_SITE_OTHER): Payer: Medicare Other

## 2018-02-18 DIAGNOSIS — T63441D Toxic effect of venom of bees, accidental (unintentional), subsequent encounter: Secondary | ICD-10-CM

## 2018-02-21 ENCOUNTER — Ambulatory Visit: Payer: Medicare Other | Admitting: Physical Therapy

## 2018-02-21 DIAGNOSIS — M25511 Pain in right shoulder: Secondary | ICD-10-CM

## 2018-02-21 DIAGNOSIS — M6281 Muscle weakness (generalized): Secondary | ICD-10-CM

## 2018-02-21 DIAGNOSIS — M4126 Other idiopathic scoliosis, lumbar region: Secondary | ICD-10-CM

## 2018-02-21 DIAGNOSIS — R278 Other lack of coordination: Secondary | ICD-10-CM | POA: Diagnosis not present

## 2018-02-21 DIAGNOSIS — M25611 Stiffness of right shoulder, not elsewhere classified: Secondary | ICD-10-CM | POA: Diagnosis not present

## 2018-02-21 NOTE — Patient Instructions (Signed)
Deep core level 2 ( handout) 6 min x 2 x day
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
## 2018-02-28 ENCOUNTER — Ambulatory Visit: Payer: Medicare Other | Admitting: Physical Therapy

## 2018-02-28 DIAGNOSIS — M4126 Other idiopathic scoliosis, lumbar region: Secondary | ICD-10-CM | POA: Diagnosis not present

## 2018-02-28 DIAGNOSIS — M6281 Muscle weakness (generalized): Secondary | ICD-10-CM | POA: Diagnosis not present

## 2018-02-28 DIAGNOSIS — M25511 Pain in right shoulder: Secondary | ICD-10-CM

## 2018-02-28 DIAGNOSIS — M25611 Stiffness of right shoulder, not elsewhere classified: Secondary | ICD-10-CM | POA: Diagnosis not present

## 2018-02-28 DIAGNOSIS — R278 Other lack of coordination: Secondary | ICD-10-CM | POA: Diagnosis not present

## 2018-02-28 NOTE — Patient Instructions (Addendum)
   Standing:  10 reps on both sides x 3 x day     3 point tap   Feet are hip width Tap forward, center under hip not feet next to each other  Tap middle\, center  Tap back    ____    Minisquat:  Wider feet position Scoot buttocks back slight, hinge like you are looking at your reflection on a pond  Knees behind toes,  Inhale to "smell flowers"  Exhale on the rise "like rocket"  Do not lock knees, have more weight across ballmounds of feet, toes relaxed   10 reps x 3 x day   ____  TO minimize irritation of perineal skin  Do not use soap nor chemical filled body wash in the vaginal area  Can take epsom bath  ( not bubble baths)
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
## 2018-03-10 ENCOUNTER — Ambulatory Visit: Payer: Medicare Other | Admitting: Physical Therapy

## 2018-03-10 DIAGNOSIS — M6281 Muscle weakness (generalized): Secondary | ICD-10-CM | POA: Diagnosis not present

## 2018-03-10 DIAGNOSIS — M25611 Stiffness of right shoulder, not elsewhere classified: Secondary | ICD-10-CM

## 2018-03-10 DIAGNOSIS — M25511 Pain in right shoulder: Secondary | ICD-10-CM

## 2018-03-10 DIAGNOSIS — M4126 Other idiopathic scoliosis, lumbar region: Secondary | ICD-10-CM

## 2018-03-10 DIAGNOSIS — R278 Other lack of coordination: Secondary | ICD-10-CM | POA: Diagnosis not present

## 2018-03-10 NOTE — Patient Instructions (Addendum)
Decreasing diapers to urinary pads     5 quick squeezes with rectal area  Every hour  Make sure to breathe soft and widening ribs not up intochest      Clam shell ( sidelying, knee up 15 reps Both x 2 x day )      3-point tap  And sidestepping to music 3-5 min     10 mini squat x 3      Walking 15 min x 2  X day   _____  Complete food diary
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
## 2018-03-14 DIAGNOSIS — L814 Other melanin hyperpigmentation: Secondary | ICD-10-CM | POA: Diagnosis not present

## 2018-03-14 DIAGNOSIS — L821 Other seborrheic keratosis: Secondary | ICD-10-CM | POA: Diagnosis not present

## 2018-03-14 DIAGNOSIS — D1801 Hemangioma of skin and subcutaneous tissue: Secondary | ICD-10-CM | POA: Diagnosis not present

## 2018-03-14 DIAGNOSIS — D225 Melanocytic nevi of trunk: Secondary | ICD-10-CM | POA: Diagnosis not present

## 2018-03-15 DIAGNOSIS — M79675 Pain in left toe(s): Secondary | ICD-10-CM | POA: Diagnosis not present

## 2018-03-15 DIAGNOSIS — M79674 Pain in right toe(s): Secondary | ICD-10-CM | POA: Diagnosis not present

## 2018-03-15 DIAGNOSIS — B351 Tinea unguium: Secondary | ICD-10-CM | POA: Diagnosis not present

## 2018-03-16 ENCOUNTER — Ambulatory Visit: Payer: Medicare Other | Attending: Internal Medicine | Admitting: Physical Therapy

## 2018-03-16 DIAGNOSIS — M4126 Other idiopathic scoliosis, lumbar region: Secondary | ICD-10-CM | POA: Insufficient documentation

## 2018-03-16 DIAGNOSIS — M25511 Pain in right shoulder: Secondary | ICD-10-CM | POA: Insufficient documentation

## 2018-03-16 DIAGNOSIS — M180 Bilateral primary osteoarthritis of first carpometacarpal joints: Secondary | ICD-10-CM | POA: Diagnosis not present

## 2018-03-16 DIAGNOSIS — M25611 Stiffness of right shoulder, not elsewhere classified: Secondary | ICD-10-CM | POA: Insufficient documentation

## 2018-03-16 DIAGNOSIS — M19042 Primary osteoarthritis, left hand: Secondary | ICD-10-CM | POA: Diagnosis not present

## 2018-03-16 DIAGNOSIS — M19041 Primary osteoarthritis, right hand: Secondary | ICD-10-CM | POA: Diagnosis not present

## 2018-03-16 DIAGNOSIS — M6281 Muscle weakness (generalized): Secondary | ICD-10-CM | POA: Insufficient documentation

## 2018-03-18 ENCOUNTER — Ambulatory Visit (INDEPENDENT_AMBULATORY_CARE_PROVIDER_SITE_OTHER): Payer: Medicare Other | Admitting: *Deleted

## 2018-03-18 DIAGNOSIS — T63441D Toxic effect of venom of bees, accidental (unintentional), subsequent encounter: Secondary | ICD-10-CM

## 2018-03-21 ENCOUNTER — Encounter: Payer: Medicare Other | Admitting: Physical Therapy

## 2018-03-23 ENCOUNTER — Ambulatory Visit: Payer: Medicare Other | Admitting: Physical Therapy

## 2018-03-23 DIAGNOSIS — M25611 Stiffness of right shoulder, not elsewhere classified: Secondary | ICD-10-CM | POA: Diagnosis not present

## 2018-03-23 DIAGNOSIS — M6281 Muscle weakness (generalized): Secondary | ICD-10-CM

## 2018-03-23 DIAGNOSIS — M4126 Other idiopathic scoliosis, lumbar region: Secondary | ICD-10-CM | POA: Diagnosis not present

## 2018-03-23 DIAGNOSIS — M25511 Pain in right shoulder: Secondary | ICD-10-CM

## 2018-03-23 NOTE — Patient Instructions (Addendum)
Try to shop at [facility] for organic veggies to increase fiber   _____  Increase water from none to 3 glasses of room temperature water  1st glass with lemon before coffee in the morning 2nd glass mid morning 3 rd glass mid afternoon   Decrease coffee from 6 to 3 cups and gradually to 1 cups   _____  Belly massage R lower side to upper R to upper L then lower L ( circular not deep prressure) 3 circles after meals   ____  Continues with pelvic floor squeeze gradual lift without overusing belly muscles     Try addeing quick squeeze with sitting up
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
## 2018-03-28 ENCOUNTER — Telehealth: Payer: Self-pay | Admitting: Internal Medicine

## 2018-03-28 NOTE — Telephone Encounter (Signed)
Dr.Young, patients physical therapist that Dr.Gessner referred pt too is calling to let Dr.Gessner know that she sent him a progress note on the patient and would like for him to review it. Dr.Young also states pt and her agree that pt should have more imagining done and would like to know if Dr.Gessner could order those.

## 2018-03-28 NOTE — Telephone Encounter (Signed)
Physical Therapist left a message with staff at Dr. Celesta Aver office to review pt's progress note and recommendation for further imaging give pt's family Hx of colon cancer.

## 2018-03-30 ENCOUNTER — Ambulatory Visit: Payer: Medicare Other | Admitting: Physical Therapy

## 2018-03-30 DIAGNOSIS — M25511 Pain in right shoulder: Secondary | ICD-10-CM

## 2018-03-30 DIAGNOSIS — M6281 Muscle weakness (generalized): Secondary | ICD-10-CM | POA: Diagnosis not present

## 2018-03-30 DIAGNOSIS — M25611 Stiffness of right shoulder, not elsewhere classified: Secondary | ICD-10-CM

## 2018-03-30 DIAGNOSIS — M4126 Other idiopathic scoliosis, lumbar region: Secondary | ICD-10-CM | POA: Diagnosis not present

## 2018-03-30 NOTE — Patient Instructions (Addendum)
  Clam Shell 45 Degrees   Lying with hips and knees bent 45, one pillow between knees and ankles. Lift knee with exhale. Be sure pelvis does not roll backward. Do not arch back. Do 10 times, each leg, 2 times per day.  http://ss.exer.us/75   Copyright  VHI. All rights reserved.     3 point tap:   To increase hip extension

## 2018-03-30 NOTE — Telephone Encounter (Signed)
I have reviewd things  1) She was referred by Tor Netters, FNP  2) The family history of colon cancer was in an elderly parent so does not impact things as far as timing of colonoscopy 3) I will see the patient in office and we can review things - next available appt - though is she going to see [facility] now ([facility]) - I am willing to see her however

## 2018-03-31 NOTE — Telephone Encounter (Signed)
Patient has been scheduled for OV in August with Dr. Carlean Purl
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
## 2018-04-04 ENCOUNTER — Ambulatory Visit: Payer: Medicare Other | Admitting: Physical Therapy

## 2018-04-04 DIAGNOSIS — M25511 Pain in right shoulder: Secondary | ICD-10-CM | POA: Diagnosis not present

## 2018-04-04 DIAGNOSIS — M6281 Muscle weakness (generalized): Secondary | ICD-10-CM | POA: Diagnosis not present

## 2018-04-04 DIAGNOSIS — M4126 Other idiopathic scoliosis, lumbar region: Secondary | ICD-10-CM

## 2018-04-04 DIAGNOSIS — M25611 Stiffness of right shoulder, not elsewhere classified: Secondary | ICD-10-CM
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
## 2018-04-04 NOTE — Patient Instructions (Addendum)
   ______  Discontinue sidelying clamsshells    Seated clams with red band  10 x 2 reps x 3 x day    minisquats with red band at thigh, kneeping butt back, knees behind toes, and knees in line with toes  10 x reps x 3    Stretch :  figure -4 stretch  5- 10 breaths

## 2018-04-06 ENCOUNTER — Other Ambulatory Visit: Payer: Self-pay | Admitting: Family Medicine
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
## 2018-04-12 ENCOUNTER — Ambulatory Visit: Payer: Medicare Other | Attending: Internal Medicine | Admitting: Physical Therapy

## 2018-04-12 DIAGNOSIS — M25511 Pain in right shoulder: Secondary | ICD-10-CM | POA: Diagnosis not present

## 2018-04-12 DIAGNOSIS — M6281 Muscle weakness (generalized): Secondary | ICD-10-CM | POA: Diagnosis not present

## 2018-04-12 DIAGNOSIS — M4126 Other idiopathic scoliosis, lumbar region: Secondary | ICD-10-CM | POA: Insufficient documentation

## 2018-04-12 DIAGNOSIS — M25611 Stiffness of right shoulder, not elsewhere classified: Secondary | ICD-10-CM | POA: Insufficient documentation
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
## 2018-04-12 NOTE — Patient Instructions (Signed)
   _______  Pelvic floor squeezes 10 reps x5 x day  Focus  Practice proper pelvic floor coordination  Inhale: expand pelvic floor muscles Exhale" "j" scoop, allow pelvic floor to close, lift first before belly sinks   ( not "draw abdominal muscle to spine" or strain with abdominal muscles")

## 2018-04-13 DIAGNOSIS — M19042 Primary osteoarthritis, left hand: Secondary | ICD-10-CM | POA: Diagnosis not present

## 2018-04-13 DIAGNOSIS — M180 Bilateral primary osteoarthritis of first carpometacarpal joints: Secondary | ICD-10-CM | POA: Diagnosis not present

## 2018-04-18 ENCOUNTER — Ambulatory Visit: Payer: Medicare Other | Admitting: Physical Therapy

## 2018-04-19 ENCOUNTER — Ambulatory Visit (INDEPENDENT_AMBULATORY_CARE_PROVIDER_SITE_OTHER): Payer: Medicare Other | Admitting: *Deleted

## 2018-04-19 DIAGNOSIS — T63441D Toxic effect of venom of bees, accidental (unintentional), subsequent encounter: Secondary | ICD-10-CM

## 2018-04-21 ENCOUNTER — Ambulatory Visit (INDEPENDENT_AMBULATORY_CARE_PROVIDER_SITE_OTHER): Payer: Medicare Other | Admitting: Rheumatology

## 2018-04-21 ENCOUNTER — Encounter: Payer: Self-pay | Admitting: Rheumatology

## 2018-04-21 VITALS — BP 131/83 | HR 61 | Resp 12 | Ht 63.0 in | Wt 162.0 lb

## 2018-04-21 DIAGNOSIS — M19042 Primary osteoarthritis, left hand: Secondary | ICD-10-CM

## 2018-04-21 DIAGNOSIS — I10 Essential (primary) hypertension: Secondary | ICD-10-CM

## 2018-04-21 DIAGNOSIS — Z96611 Presence of right artificial shoulder joint: Secondary | ICD-10-CM | POA: Diagnosis not present

## 2018-04-21 DIAGNOSIS — M797 Fibromyalgia: Secondary | ICD-10-CM | POA: Diagnosis not present

## 2018-04-21 DIAGNOSIS — Z96651 Presence of right artificial knee joint: Secondary | ICD-10-CM | POA: Diagnosis not present

## 2018-04-21 DIAGNOSIS — R7303 Prediabetes: Secondary | ICD-10-CM

## 2018-04-21 DIAGNOSIS — M5136 Other intervertebral disc degeneration, lumbar region: Secondary | ICD-10-CM

## 2018-04-21 DIAGNOSIS — K219 Gastro-esophageal reflux disease without esophagitis: Secondary | ICD-10-CM

## 2018-04-21 DIAGNOSIS — I5189 Other ill-defined heart diseases: Secondary | ICD-10-CM

## 2018-04-21 DIAGNOSIS — M19041 Primary osteoarthritis, right hand: Secondary | ICD-10-CM

## 2018-04-21 NOTE — Patient Instructions (Signed)

## 2018-05-03 ENCOUNTER — Ambulatory Visit: Payer: Medicare Other | Admitting: Rheumatology

## 2018-05-14 ENCOUNTER — Other Ambulatory Visit: Payer: Self-pay | Admitting: Family Medicine

## 2018-05-16 NOTE — Telephone Encounter (Signed)
Last office visit 11/12/2017 for CPE.  Last refilled 01/07/2018 for #90 with no refills.  Ok to refill?

## 2018-05-24 ENCOUNTER — Other Ambulatory Visit: Payer: Self-pay | Admitting: Family Medicine

## 2018-05-26 ENCOUNTER — Other Ambulatory Visit: Payer: Medicare Other

## 2018-05-26 ENCOUNTER — Telehealth: Payer: Self-pay | Admitting: Family Medicine

## 2018-05-26 DIAGNOSIS — R7303 Prediabetes: Secondary | ICD-10-CM

## 2018-05-26 DIAGNOSIS — E7800 Pure hypercholesterolemia, unspecified: Secondary | ICD-10-CM

## 2018-05-26 NOTE — Telephone Encounter (Signed)
-----   Message from Ellamae Sia sent at 05/17/2018  9:47 AM EDT ----- Regarding: Lab orders for Thursday, 8.15.19 Lab orders for a 6 month follow up appt

## 2018-05-31 ENCOUNTER — Encounter: Payer: Self-pay | Admitting: Family Medicine

## 2018-05-31 ENCOUNTER — Ambulatory Visit (INDEPENDENT_AMBULATORY_CARE_PROVIDER_SITE_OTHER): Payer: Medicare Other | Admitting: Family Medicine

## 2018-05-31 ENCOUNTER — Ambulatory Visit (INDEPENDENT_AMBULATORY_CARE_PROVIDER_SITE_OTHER): Payer: Medicare Other | Admitting: *Deleted

## 2018-05-31 ENCOUNTER — Ambulatory Visit: Payer: Medicare Other | Admitting: Family Medicine

## 2018-05-31 ENCOUNTER — Encounter: Payer: Self-pay | Admitting: *Deleted

## 2018-05-31 VITALS — BP 110/78 | HR 63 | Temp 98.3°F | Ht 63.0 in | Wt 161.2 lb

## 2018-05-31 DIAGNOSIS — E7800 Pure hypercholesterolemia, unspecified: Secondary | ICD-10-CM

## 2018-05-31 DIAGNOSIS — T63441D Toxic effect of venom of bees, accidental (unintentional), subsequent encounter: Secondary | ICD-10-CM | POA: Diagnosis not present

## 2018-05-31 DIAGNOSIS — R7303 Prediabetes: Secondary | ICD-10-CM | POA: Diagnosis not present

## 2018-05-31 DIAGNOSIS — I10 Essential (primary) hypertension: Secondary | ICD-10-CM | POA: Diagnosis not present

## 2018-05-31 DIAGNOSIS — M797 Fibromyalgia: Secondary | ICD-10-CM | POA: Diagnosis not present

## 2018-05-31 NOTE — Assessment & Plan Note (Signed)
Well-controlled on losartan 50 mg daily.

## 2018-05-31 NOTE — Assessment & Plan Note (Signed)
Plans healthy diet and natural supplements to treat.

## 2018-05-31 NOTE — Assessment & Plan Note (Signed)
Due for re-eval. Encouraged exercise, weight loss, healthy eating habits.

## 2018-05-31 NOTE — Addendum Note (Signed)
Addended by: Ellamae Sia on: 05/31/2018 03:42 PM   Modules accepted: Orders

## 2018-05-31 NOTE — Patient Instructions (Addendum)
Please stop at the lab to have labs drawn.  Work on [employer] and  regualr exercise

## 2018-05-31 NOTE — Progress Notes (Signed)
Subjective:    Patient ID: Sherri Murray, female    DOB: 12-Apr-1941, 77 y.o.   MRN: [id number]  HPI    77 year old female presents for 6 month follow up.   Recently got back from a trip to [place] with sister.   She has been seeing Dr. 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  Dr. Fredna Dow recommended antiinflammatory cream.  She has chronic pain in her right shoulder/Right shoulder replacement since her fall in 2017  and right TKR and is followed by [facility], Dr. Lorin Mercy.  Elevated Cholesterol: Due for re-eval on lipitor 40 mg dialy Using medications without problems: Muscle aches:  Diet compliance: Good.. Plans going organic and watching diet close Exercise: walking some, palns to do more. Other complaints: prediabetes due for re-eval.  Hypertension:   Good control on losartan 50 mg daily.  Using medication without problems or lightheadedness:  none Chest pain with exertion:none Edema:none Short of breath:none Average home BPs: Other issues:   Decreased hearing in left ear in last 2 days, no pain, no discharge.   Social History /Family History/Past Medical History reviewed in detail and updated in EMR if needed. Blood pressure 110/78, pulse 63, temperature 98.3 F (36.8 C), temperature source Oral, height 5\' 3"  (1.6 m), weight 161 lb 4 oz (73.1 kg).   Review of Systems  Constitutional: Negative for fatigue and fever.  HENT: Negative for congestion.   Eyes: Negative for pain.  Respiratory: Negative for cough and shortness of breath.   Cardiovascular: Negative for chest pain, palpitations and leg swelling.  Gastrointestinal: Negative for abdominal pain.  Genitourinary: Negative for dysuria and vaginal bleeding.  Musculoskeletal: Negative for back pain.  Neurological: Negative for syncope, light-headedness and headaches.  Psychiatric/Behavioral: Negative for dysphoric mood.       Objective:   Physical  Exam  Constitutional: Vital signs are normal. She appears well-developed and well-nourished. She is cooperative.  Non-toxic appearance. She does not appear ill. No distress.  HENT:  Head: Normocephalic.  Right Ear: Hearing, tympanic membrane, external ear and ear canal normal.  Left Ear: Hearing, tympanic membrane, external ear and ear canal normal.  Nose: Nose normal.  Eyes: Pupils are equal, round, and reactive to light. Conjunctivae, EOM and lids are normal. Lids are everted and swept, no foreign bodies found.  Neck: Trachea normal and normal range of motion. Neck supple. Carotid bruit is not present. No thyroid mass and no thyromegaly present.  Cardiovascular: Normal rate, regular rhythm, S1 normal, S2 normal, normal heart sounds and intact distal pulses. Exam reveals no gallop.  No murmur heard. Pulmonary/Chest: Effort normal and breath sounds normal. No respiratory distress. She has no wheezes. She has no rhonchi. She has no rales.  Abdominal: Soft. Normal appearance and bowel sounds are normal. She exhibits no distension, no fluid wave, no abdominal bruit and no mass. There is no hepatosplenomegaly. There is no tenderness. There is no rebound, no guarding and no CVA tenderness. No hernia.  Musculoskeletal:  No current trigger points  Decreased ROm in right shoulder  Lymphadenopathy:    She has no cervical adenopathy.    She has no axillary adenopathy.  Neurological: She is alert. She has normal strength. No cranial nerve deficit or sensory deficit.  Skin: Skin is warm, dry and intact. No rash noted.  Psychiatric: Her speech is normal and behavior is normal. Judgment normal. Her mood appears not anxious. Cognition and memory  are normal. She does not exhibit a depressed mood.          Assessment & Plan:

## 2018-05-31 NOTE — Assessment & Plan Note (Signed)
Due for re-eval. 

## 2018-06-01 ENCOUNTER — Ambulatory Visit: Payer: Medicare Other | Attending: Internal Medicine | Admitting: Physical Therapy

## 2018-06-01 DIAGNOSIS — M4126 Other idiopathic scoliosis, lumbar region: Secondary | ICD-10-CM | POA: Diagnosis not present

## 2018-06-01 DIAGNOSIS — M6281 Muscle weakness (generalized): Secondary | ICD-10-CM | POA: Diagnosis not present

## 2018-06-01 DIAGNOSIS — M25611 Stiffness of right shoulder, not elsewhere classified: Secondary | ICD-10-CM | POA: Diagnosis not present

## 2018-06-01 DIAGNOSIS — M25511 Pain in right shoulder: Secondary | ICD-10-CM

## 2018-06-01 LAB — COMPREHENSIVE METABOLIC PANEL
ALBUMIN: 4 g/dL (ref 3.5–5.2)
ALT: 17 U/L (ref 0–35)
AST: 25 U/L (ref 0–37)
Alkaline Phosphatase: 59 U/L (ref 39–117)
BILIRUBIN TOTAL: 0.4 mg/dL (ref 0.2–1.2)
BUN: 11 mg/dL (ref 6–23)
CALCIUM: 9.7 mg/dL (ref 8.4–10.5)
CO2: 25 mEq/L (ref 19–32)
CREATININE: 0.85 mg/dL (ref 0.40–1.20)
Chloride: 102 mEq/L (ref 96–112)
GFR: 68.73 mL/min (ref >60.00)
Glucose, Bld: 112 mg/dL — ABNORMAL HIGH (ref 70–99)
Potassium: 4.4 mEq/L (ref 3.5–5.1)
Sodium: 135 mEq/L (ref 135–145)
Total Protein: 7.2 g/dL (ref 6.0–8.3)

## 2018-06-01 LAB — LIPID PANEL
CHOLESTEROL: 132 mg/dL (ref 0–200)
HDL: 43.6 mg/dL (ref >39.00)
LDL CALC: 60 mg/dL (ref 0–99)
NONHDL: 88.75
Total CHOL/HDL Ratio: 3
Triglycerides: 146 mg/dL (ref 0.0–149.0)
VLDL: 29.2 mg/dL (ref 0.0–40.0)

## 2018-06-01 LAB — HEMOGLOBIN A1C: HEMOGLOBIN A1C: 5.7 % (ref 4.6–6.5)

## 2018-06-01 NOTE — Patient Instructions (Addendum)
Buy a Fitness Aerobic Step Adjust 4" - 6" to place at the foot of your bed. It is wide to decrease risk for falling.    Buy a nonskid mat to place the step stool on for climbing up the bed  ________  Getting in/out of the bed: Sit -> sidelying_> roll onto bed    Warm up exercises for hips after walking -standing perpendicular to bed , leg circles x 10, balance on the opposite non moving leg with more weight at ballmounds and heels not just the heels  -propping foot on chair that doesnot move, and rock hips forward and back       Proper body mechanics with getting out of a chair to decrease strain  on back &pelvic floor   Avoid holding your breath when Getting out of the chair:  Scoot to front part of chair chair Heels behind feet, feet are hip width apart, nose over toes  Inhale like you are smelling roses Exhale to stand    ____   Breathe as you exert, no more grunting to minimize strain onto the pelvic floor   _____  Walking 15 min per day x 2    Figure-4 stretch 5 soft breath After walking   Thigh cross over thigh with twist   _____  Continue with pelvic floor exercises    ________   2 laps side stepping down your hallway  2x Left , 2 x right Feet placed hip width apart ,  Not on your heel , balance your weight between h eel and abllmounds  To decrease foot pain

## 2018-06-02 ENCOUNTER — Encounter: Payer: Self-pay | Admitting: *Deleted
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
## 2018-06-07 ENCOUNTER — Other Ambulatory Visit: Payer: Self-pay | Admitting: Allergy & Immunology

## 2018-06-08 ENCOUNTER — Ambulatory Visit: Payer: Medicare Other | Admitting: Physical Therapy

## 2018-06-08 ENCOUNTER — Ambulatory Visit (INDEPENDENT_AMBULATORY_CARE_PROVIDER_SITE_OTHER): Payer: Medicare Other | Admitting: Internal Medicine

## 2018-06-08 ENCOUNTER — Encounter: Payer: Self-pay | Admitting: Internal Medicine

## 2018-06-08 VITALS — BP 112/68 | HR 70 | Ht 63.0 in | Wt 158.4 lb

## 2018-06-08 DIAGNOSIS — N8189 Other female genital prolapse: Secondary | ICD-10-CM

## 2018-06-08 DIAGNOSIS — K529 Noninfective gastroenteritis and colitis, unspecified: Secondary | ICD-10-CM | POA: Diagnosis not present

## 2018-06-08 DIAGNOSIS — K580 Irritable bowel syndrome with diarrhea: Secondary | ICD-10-CM

## 2018-06-08 MED ORDER — PANCRELIPASE (LIP-PROT-AMYL) 40000-126000 UNITS PO CPEP: 2.0000 | ORAL_CAPSULE | Freq: Three times a day (TID) | ORAL | 0 refills | Status: DC

## 2018-06-08 NOTE — Patient Instructions (Signed)
We are giving you a breath collection kit to take home to complete and mail in.   After you do your breath test you may start the Zenpep samples: Take 2 capsules with meals.   After testing try the FODMAP diet, look up this information on [email or web address].    Please follow up with Dr Carlean Purl in October.    I appreciate the opportunity to care for you. Silvano Rusk, MD, [facility]
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
## 2018-06-09 ENCOUNTER — Encounter: Payer: Self-pay | Admitting: Internal Medicine

## 2018-06-10 ENCOUNTER — Telehealth: Payer: Self-pay

## 2018-06-10 NOTE — Telephone Encounter (Signed)
Copied from [place] [id number]. 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

## 2018-06-10 NOTE — Telephone Encounter (Signed)
Patient says this began about 2 weeks ago when she had a bad fall and hasn't been able to hear well out of it since.  Appointment scheduled on Tuesday, Sept 3 at 2:45.

## 2018-06-10 NOTE — Telephone Encounter (Signed)
I would start with OV on Tuesday to rule out cerumen impaction or infection... If none seen I can then refer to ENT. Also if theis is chronic and not acute issue.. Let me know and I will go ahead and refer.

## 2018-06-10 NOTE — Telephone Encounter (Signed)
Pt last seen 05/31/18.

## 2018-06-14 ENCOUNTER — Ambulatory Visit: Payer: Medicare Other | Admitting: Family Medicine

## 2018-06-14 DIAGNOSIS — Z0289 Encounter for other administrative examinations: Secondary | ICD-10-CM

## 2018-06-15 ENCOUNTER — Encounter: Payer: Medicare Other | Admitting: Physical Therapy

## 2018-06-15 DIAGNOSIS — M180 Bilateral primary osteoarthritis of first carpometacarpal joints: Secondary | ICD-10-CM | POA: Diagnosis not present

## 2018-06-16 ENCOUNTER — Encounter: Payer: Self-pay | Admitting: Family Medicine

## 2018-06-16 ENCOUNTER — Ambulatory Visit (INDEPENDENT_AMBULATORY_CARE_PROVIDER_SITE_OTHER): Payer: Medicare Other | Admitting: Family Medicine

## 2018-06-16 VITALS — BP 120/60 | HR 62 | Temp 97.6°F | Ht 63.0 in | Wt 158.8 lb

## 2018-06-16 DIAGNOSIS — Z23 Encounter for immunization: Secondary | ICD-10-CM | POA: Diagnosis not present

## 2018-06-16 DIAGNOSIS — H6121 Impacted cerumen, right ear: Secondary | ICD-10-CM | POA: Diagnosis not present
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
## 2018-06-16 NOTE — Addendum Note (Signed)
Addended by: Pilar Grammes on: 06/16/2018 10:55 AM   Modules accepted: Orders
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
## 2018-06-17 ENCOUNTER — Telehealth: Payer: Self-pay | Admitting: Internal Medicine

## 2018-06-17 NOTE — Telephone Encounter (Signed)
Patient had a flu vaccine yesterday and is asking if she can still perform breath test.  She is advised that it is ok to proceed

## 2018-06-20 ENCOUNTER — Ambulatory Visit: Payer: Medicare Other | Attending: Internal Medicine | Admitting: Physical Therapy

## 2018-06-20 DIAGNOSIS — M25611 Stiffness of right shoulder, not elsewhere classified: Secondary | ICD-10-CM

## 2018-06-20 DIAGNOSIS — M4126 Other idiopathic scoliosis, lumbar region: Secondary | ICD-10-CM | POA: Diagnosis not present

## 2018-06-20 DIAGNOSIS — M25511 Pain in right shoulder: Secondary | ICD-10-CM

## 2018-06-20 DIAGNOSIS — M6281 Muscle weakness (generalized): Secondary | ICD-10-CM | POA: Diagnosis not present
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
## 2018-06-20 NOTE — Patient Instructions (Addendum)
ergonomics with household chores ( handout)   Seated pelvic floor contractions 5 reps x 5 throughout the day, less chest breathing

## 2018-06-25 ENCOUNTER — Encounter: Payer: Self-pay | Admitting: Internal Medicine

## 2018-06-25 DIAGNOSIS — K529 Noninfective gastroenteritis and colitis, unspecified: Secondary | ICD-10-CM | POA: Diagnosis not present

## 2018-06-25 DIAGNOSIS — K580 Irritable bowel syndrome with diarrhea: Secondary | ICD-10-CM | POA: Diagnosis not present

## 2018-06-28 ENCOUNTER — Ambulatory Visit: Payer: Medicare Other

## 2018-06-28 DIAGNOSIS — M6281 Muscle weakness (generalized): Secondary | ICD-10-CM | POA: Diagnosis not present

## 2018-06-28 DIAGNOSIS — M25511 Pain in right shoulder: Secondary | ICD-10-CM | POA: Diagnosis not present

## 2018-06-28 DIAGNOSIS — M4126 Other idiopathic scoliosis, lumbar region: Secondary | ICD-10-CM

## 2018-06-28 DIAGNOSIS — M25611 Stiffness of right shoulder, not elsewhere classified: Secondary | ICD-10-CM | POA: Diagnosis not present
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
## 2018-06-29 ENCOUNTER — Ambulatory Visit (INDEPENDENT_AMBULATORY_CARE_PROVIDER_SITE_OTHER): Payer: Medicare Other | Admitting: *Deleted

## 2018-06-29 ENCOUNTER — Encounter: Payer: Medicare Other | Admitting: Physical Therapy

## 2018-06-29 DIAGNOSIS — T63441D Toxic effect of venom of bees, accidental (unintentional), subsequent encounter: Secondary | ICD-10-CM | POA: Diagnosis not present

## 2018-06-29 MED ORDER — EPINEPHRINE (ANAPHYLAXIS) 1 MG/ML IJ SOLN
0.3000 mg | Freq: Once | INTRAMUSCULAR | Status: AC
  Administered 2018-06-29: 0.3 mg via INTRAMUSCULAR

## 2018-06-29 MED ORDER — MOMETASONE FUROATE 50 MCG/ACT NA SUSP: 2.0000 | Freq: Every day | NASAL | 0 refills | Status: DC

## 2018-06-29 NOTE — Progress Notes (Signed)
Patient came back up at 1143 to get arm checked injection site was 4++ red and she complained of throat being itchy and scratchy then stated her lips felt tingly. Writer went to get Dr Ernst Bowler and a few nurses who assisted in reaction Epinephrine 0.3 mg was administered, zyrtec 10 mg, prednisone 30 mg. Patient was observed for 1.5 hours and daughter was called and took patient home. Patient called at 6 states she is doing well she will eat something then lay down to rest. Upon discussion with Eustaquio Maize, CMA states we should proceed next injection with 0.5 then wait 30 min if does ok give 0.5 patient must take Zyrtec prior to administration.

## 2018-06-30 ENCOUNTER — Telehealth: Payer: Self-pay | Admitting: *Deleted

## 2018-06-30 NOTE — Telephone Encounter (Signed)
Noted in immunotherapy

## 2018-06-30 NOTE — Telephone Encounter (Signed)
Called patient to follow up from allergic reaction patient had 06/29/18 states she is doing well today.

## 2018-06-30 NOTE — Telephone Encounter (Signed)
Noted. Thanks for following up! We are going to give 50% of the dose next time, wait 30 minutes, and then given the remaining 50% of the dose. Discussed with Nancee Liter on 06/29/18.  Salvatore Marvel, MD Allergy and [place] [facility] [place]

## 2018-07-04 ENCOUNTER — Ambulatory Visit (INDEPENDENT_AMBULATORY_CARE_PROVIDER_SITE_OTHER): Payer: Medicare Other | Admitting: Allergy & Immunology

## 2018-07-04 ENCOUNTER — Encounter: Payer: Self-pay | Admitting: Allergy & Immunology

## 2018-07-04 ENCOUNTER — Telehealth: Payer: Self-pay | Admitting: Internal Medicine

## 2018-07-04 VITALS — BP 132/74 | HR 61 | Temp 98.2°F | Resp 16 | Ht 61.0 in | Wt 160.0 lb

## 2018-07-04 DIAGNOSIS — J4520 Mild intermittent asthma, uncomplicated: Secondary | ICD-10-CM

## 2018-07-04 DIAGNOSIS — T63441D Toxic effect of venom of bees, accidental (unintentional), subsequent encounter: Secondary | ICD-10-CM | POA: Diagnosis not present

## 2018-07-04 DIAGNOSIS — J310 Chronic rhinitis: Secondary | ICD-10-CM | POA: Diagnosis not present

## 2018-07-04 MED ORDER — EPINEPHRINE 0.3 MG/0.3ML IJ SOAJ: 0.3000 mg | Freq: Once | INTRAMUSCULAR | 1 refills | Status: AC

## 2018-07-04 MED ORDER — MONTELUKAST SODIUM 10 MG PO TABS: 10.0000 mg | ORAL_TABLET | Freq: Every day | ORAL | 1 refills | Status: DC

## 2018-07-04 MED ORDER — ALBUTEROL SULFATE HFA 108 (90 BASE) MCG/ACT IN AERS: 2.0000 | INHALATION_SPRAY | RESPIRATORY_TRACT | 1 refills | Status: DC | PRN

## 2018-07-04 NOTE — Progress Notes (Signed)
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 Mild intermittent asthma - Continue montelukast 10mg  daily.   - We will send in a prescription for ProAir 2 puffs every 4-6 hours as needed.   2. Adverse food reactions (multiple foods) - Continue to avoid all of your foods.  - New EpiPen sent in.   3. Chronic rhinitis - Continue with nasal saline as needed.  - Continue with loratadine (Claritin) 10mg  twice daily.   - Continue with Nasonex one spray per nostril once daily.  - Continue with the current eye drop.   4. 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   - We are going to give 50% of the injection next time, wait 30 minutes, and then give the remaining injection.   5. Return in about 1 year (around 07/05/2019).  Subjective:   Sherri Murray is a 77 y.o. female presenting today for follow up of  Chief Complaint  Patient presents with  . Immunotherapy  . Breathing Problem  . Allergic Reaction    patient had reaction to venom shot     Sherri Murray has a history of the following: Patient Active Problem List   Diagnosis Date Noted  . H/O total shoulder replacement, right 05/03/2017  . Secondary osteoarthritis of shoulder, right 02/05/2017  . DJD of right shoulder 02/05/2017  . Fibromyalgia 01/27/2017  . Primary osteoarthritis of both hands 01/27/2017  . Post-traumatic osteoarthritis of right shoulder 12/23/2016  . PAC (premature atrial contraction) 12/08/2016  . Adhesive capsulitis of right shoulder 08/18/2016  . External hemorrhoids without complication 34/28/7681  . Osteoarthritis of right knee 12/25/2015  . H/O total knee replacement, right 12/11/2015  . Advanced directives,  counseling/discussion 10/18/2015  . Total body pain 08/13/2015  . Hearing loss due to cerumen impaction, right 07/05/2015  . Diastolic dysfunction 15/72/6203  . Mild mitral regurgitation 12/20/2014  . Benign essential HTN 10/19/2014  . High cholesterol 05/01/2014  . Prediabetes 05/01/2014  . Allergic rhinitis 09/28/2013  . Mild intermittent asthma 09/28/2013  . Ischemic colitis, hx of 09/28/2013  . Hx of migraines 09/28/2013  . GERD (gastroesophageal reflux disease) 09/28/2013  . 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 Varicose veins of lower extremities with other complications 55/97/4163  . Osteoporosis screening 04/21/2013  . Incontinence of urine   . IBS (irritable bowel syndrome) 02/27/2012    History obtained from: chart review and patient.  Derrel Nip Primary Care Provider is Jinny Sanders, MD.     Sherri Murray is a 77 y.o. female presenting for a follow up visit.  She was last seen in August 2018.  At that time, her asthma was under good control.  We actually marked this is resolved because she has not needed her inhaler in quite some time.  She has a history of adverse reactions to multiple foods.  We did refill her EpiPen.  She has a history of seasonal and perennial allergic rhinitis.  We continued with loratadine 10 mg twice daily as well as Nasonex 1 spray per nostril twice daily.  She has a history of venom anaphylaxis and we continued her on her immunotherapy monthly.  At that last visit, she was reporting a foreign body in her throat, and we recommended  an ENT evaluation; it seems that she never kept these appointments.   Since the last visit, she has mostly done well.  However, last week, she developed tightness in her throat as well as urticaria from her venom immunotherapy.  We did treat her with epinephrine as well as antihistamines and steroid.  We watched her for a couple hours before she was sent home with her daughter.  We are planning to change her dosing  to 50% of the dose at the next visit followed by a 30-minute wait time and then the remaining 50%.  We were unsure why she had reacted, she denied any concurrent viral illnesses.  In any case, she resolved very quickly with epinephrine and antihistamine.  Patient has been doing well since her last visit. She reports that her allergies have been well controlled with her current regimen. She denies any runny nose, cough, congestion, itchy eyes, or teary eyes. She has been using claritin and her nasal spray. She does report that about 1-2 times a month she will develop a sensation of mucus that is stuck in the back of her throat and she is unable to cough it up or clear it. She was referred to an ENT doctor at her last visit but she reports that she has not seen them yet.   As far as her breathing goes she reports that she has not had any issues with shortness of breath, wheezing, or chest tightness. She is still using her singulair daily. She does not have an inhaler but reports that she would like one. The keeps calling this a "wolfer" during the visit today.   Otherwise, there have been no changes to her past medical history, surgical history, family history, or social history. She is currently living with her boyfriend, who has severe COPD. She moved him from [place] since the last visit, and it does not sound like he is very friendly.     Review of Systems: a 14-point review of systems is pertinent for what is mentioned in HPI.  Otherwise, all other systems were negative. Constitutional: negative other than that listed in the HPI Eyes: negative other than that listed in the HPI Ears, nose, mouth, throat, and face: negative other than that listed in the HPI Respiratory: negative other than that listed in the HPI Cardiovascular: negative other than that listed in the HPI Gastrointestinal: negative other than that listed in the HPI Genitourinary: negative other than that listed in the HPI Integument:  negative other than that listed in the HPI Hematologic: negative other than that listed in the HPI Musculoskeletal: negative other than that listed in the HPI Neurological: negative other than that listed in the HPI Allergy/Immunologic: negative other than that listed in the HPI    Objective:   Blood pressure 132/74, pulse 61, temperature 98.2 F (36.8 C), temperature source Oral, resp. rate 16, height 5\' 1"  (1.549 m), weight 160 lb (72.6 kg), SpO2 97 %. Body mass index is 30.23 kg/m.   Physical Exam:  General: Alert, interactive, in no acute distress. Pleasant, somewhat loopy female.  Eyes: No conjunctival injection bilaterally, no discharge on the right, no discharge on the left and no Horner-Trantas dots present. PERRL bilaterally. EOMI without pain. No photophobia.  Ears: Right TM pearly gray with normal light reflex, Left TM pearly gray with normal light reflex, Right TM intact without perforation and Left TM intact without perforation.  Nose/Throat: External nose within normal limits and septum midline. Turbinates edematous and pale with  clear discharge. Posterior oropharynx erythematous with cobblestoning in the posterior oropharynx. Tonsils 2+ without exudates.  Tongue without thrush. Lungs: Clear to auscultation without wheezing, rhonchi or rales. No increased work of breathing. CV: Normal S1/S2. No murmurs. Capillary refill <2 seconds.  Skin: Warm and dry, without lesions or rashes. Neuro:   Grossly intact. No focal deficits appreciated. Responsive to questions.  Diagnostic studies:   Spirometry: results normal (FEV1: .1.68/88%, FVC: 1.88/73%, FEV1/FVC: 89%).    Spirometry consistent with normal pattern.    Allergy Studies: none    Salvatore Marvel, MD  [facility]

## 2018-07-04 NOTE — Patient Instructions (Addendum)
1. Mild intermittent asthma - Continue montelukast 10mg  daily.   - We will send in a prescription for ProAir 2 puffs every 4-6 hours as needed.   2. Adverse food reactions (multiple foods) - Continue to avoid all of your foods.  - New EpiPen sent in.   3. Chronic rhinitis - Continue with nasal saline as needed.  - Continue with loratadine (Claritin) 10mg  twice daily.   - Continue with Nasonex one spray per nostril once daily.  - Continue with the current eye drop.   4. 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   - We are going to give 50% of the injection next time, wait 30 minutes, and then give the remaining injection.   5. Return in about 1 year (around 07/05/2019).  Please inform us of any Emergency Department visits, hospitalizations, or changes in symptoms. Call us before going to the ED for breathing or allergy symptoms since we might be able to fit you in for a sick visit. Feel free to contact us anytime with any questions, problems, or concerns.  It was a pleasure to see you again today!   Websites that have reliable patient information: 1. American Academy of Asthma, Allergy, and Immunology: www.aaaai.org 2. Food Allergy Research and Education (FARE): foodallergy.org 3. Mothers of Asthmatics: http://www.asthmacommunitynetwork.org 4. American College of Allergy, Asthma, and Immunology: www.acaai.org

## 2018-07-04 NOTE — Telephone Encounter (Signed)
Results of breath test is indeterminate. Does she need to repeat?

## 2018-07-05 ENCOUNTER — Encounter: Payer: Self-pay | Admitting: Physical Therapy

## 2018-07-05 ENCOUNTER — Ambulatory Visit: Payer: Medicare Other | Admitting: Physical Therapy

## 2018-07-05 DIAGNOSIS — M4126 Other idiopathic scoliosis, lumbar region: Secondary | ICD-10-CM | POA: Diagnosis not present

## 2018-07-05 DIAGNOSIS — M25511 Pain in right shoulder: Secondary | ICD-10-CM | POA: Diagnosis not present

## 2018-07-05 DIAGNOSIS — M25611 Stiffness of right shoulder, not elsewhere classified: Secondary | ICD-10-CM

## 2018-07-05 DIAGNOSIS — M6281 Muscle weakness (generalized): Secondary | ICD-10-CM | POA: Diagnosis not present
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
## 2018-07-06 ENCOUNTER — Encounter: Payer: Medicare Other | Admitting: Physical Therapy

## 2018-07-12 ENCOUNTER — Encounter: Payer: Self-pay | Admitting: Physical Therapy

## 2018-07-12 ENCOUNTER — Ambulatory Visit: Payer: Medicare Other | Attending: Internal Medicine | Admitting: Physical Therapy

## 2018-07-12 DIAGNOSIS — M6281 Muscle weakness (generalized): Secondary | ICD-10-CM | POA: Diagnosis not present

## 2018-07-12 DIAGNOSIS — M25611 Stiffness of right shoulder, not elsewhere classified: Secondary | ICD-10-CM | POA: Diagnosis not present

## 2018-07-12 DIAGNOSIS — M4126 Other idiopathic scoliosis, lumbar region: Secondary | ICD-10-CM | POA: Insufficient documentation

## 2018-07-12 DIAGNOSIS — M25511 Pain in right shoulder: Secondary | ICD-10-CM | POA: Diagnosis not present
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
## 2018-07-12 NOTE — Telephone Encounter (Signed)
Yes please

## 2018-07-13 NOTE — Telephone Encounter (Signed)
Left message for patient to call back  

## 2018-07-13 NOTE — Telephone Encounter (Signed)
Patient notified. She is willing to repeat the test.  She will come  pick up the test kit

## 2018-07-15 ENCOUNTER — Encounter: Payer: Self-pay | Admitting: Urology

## 2018-07-15 ENCOUNTER — Ambulatory Visit (INDEPENDENT_AMBULATORY_CARE_PROVIDER_SITE_OTHER): Payer: Medicare Other | Admitting: Urology

## 2018-07-15 VITALS — BP 136/78 | HR 65 | Ht 63.0 in | Wt 156.2 lb

## 2018-07-15 DIAGNOSIS — N3941 Urge incontinence: Secondary | ICD-10-CM | POA: Diagnosis not present

## 2018-07-15 LAB — URINALYSIS, COMPLETE
Bilirubin, UA: NEGATIVE
Glucose, UA: NEGATIVE
Ketones, UA: NEGATIVE
Leukocytes, UA: NEGATIVE
NITRITE UA: NEGATIVE
PH UA: 6 (ref 5.0–7.5)
Protein, UA: NEGATIVE
RBC, UA: NEGATIVE
Specific Gravity, UA: 1.01 (ref 1.005–1.030)
UUROB: 0.2 mg/dL (ref 0.2–1.0)

## 2018-07-15 LAB — MICROSCOPIC EXAMINATION
Epithelial Cells (non renal): NONE SEEN /hpf (ref 0–10)
RBC MICROSCOPIC, UA: NONE SEEN /HPF (ref 0–2)
WBC UA: NONE SEEN /HPF (ref 0–5)
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
## 2018-07-18 DIAGNOSIS — M180 Bilateral primary osteoarthritis of first carpometacarpal joints: Secondary | ICD-10-CM | POA: Diagnosis not present

## 2018-07-19 ENCOUNTER — Encounter: Payer: Medicare Other | Admitting: Physical Therapy

## 2018-07-26 ENCOUNTER — Ambulatory Visit: Payer: Medicare Other | Admitting: Physical Therapy

## 2018-07-26 ENCOUNTER — Ambulatory Visit (INDEPENDENT_AMBULATORY_CARE_PROVIDER_SITE_OTHER): Payer: Medicare Other | Admitting: Internal Medicine

## 2018-07-26 ENCOUNTER — Encounter: Payer: Self-pay | Admitting: Physical Therapy

## 2018-07-26 ENCOUNTER — Encounter: Payer: Self-pay | Admitting: Internal Medicine

## 2018-07-26 VITALS — BP 110/58 | HR 76 | Ht 63.0 in | Wt 157.0 lb

## 2018-07-26 DIAGNOSIS — Z91018 Allergy to other foods: Secondary | ICD-10-CM | POA: Diagnosis not present

## 2018-07-26 DIAGNOSIS — M4126 Other idiopathic scoliosis, lumbar region: Secondary | ICD-10-CM | POA: Diagnosis not present

## 2018-07-26 DIAGNOSIS — M25511 Pain in right shoulder: Secondary | ICD-10-CM | POA: Diagnosis not present

## 2018-07-26 DIAGNOSIS — K9049 Malabsorption due to intolerance, not elsewhere classified: Secondary | ICD-10-CM | POA: Diagnosis not present

## 2018-07-26 DIAGNOSIS — K580 Irritable bowel syndrome with diarrhea: Secondary | ICD-10-CM

## 2018-07-26 DIAGNOSIS — M6281 Muscle weakness (generalized): Secondary | ICD-10-CM

## 2018-07-26 DIAGNOSIS — M25611 Stiffness of right shoulder, not elsewhere classified: Secondary | ICD-10-CM | POA: Diagnosis not present

## 2018-07-26 NOTE — Assessment & Plan Note (Addendum)
Much better with diet changes she has made.  She will continue these and follow-up as needed.  Pelvic floor physical therapy has helped also.
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
## 2018-07-26 NOTE — Patient Instructions (Signed)
Keep up the good work with your diet, glad your better.    Follow up with Dr Carlean Purl as needed.    I appreciate the opportunity to care for you. Silvano Rusk, MD, [facility]
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
## 2018-07-27 ENCOUNTER — Ambulatory Visit (INDEPENDENT_AMBULATORY_CARE_PROVIDER_SITE_OTHER): Payer: Medicare Other | Admitting: *Deleted

## 2018-07-27 DIAGNOSIS — T63441D Toxic effect of venom of bees, accidental (unintentional), subsequent encounter: Secondary | ICD-10-CM

## 2018-07-27 DIAGNOSIS — Z91018 Allergy to other foods: Secondary | ICD-10-CM | POA: Insufficient documentation

## 2018-07-27 DIAGNOSIS — K9049 Malabsorption due to intolerance, not elsewhere classified: Secondary | ICD-10-CM | POA: Insufficient documentation

## 2018-07-27 NOTE — Assessment & Plan Note (Addendum)
These are documented with prior testing and have caused systemic or contact side effects as opposed to a intolerance got symptoms.  She avoids these.  Did explain the difference between these allergies and food intolerance which she also seems to have.

## 2018-07-27 NOTE — Assessment & Plan Note (Signed)
He may have a wheat sensitivity, she is avoiding foods that cause gastrointestinal distress and I explained these were unlikely to be allergies but more of intolerances.  She knows to avoid the foods that caused stomach responses with her allergies.  Continues to follow-up with allergist.  She has an EpiPen.

## 2018-08-01 ENCOUNTER — Other Ambulatory Visit: Payer: Self-pay | Admitting: Allergy & Immunology

## 2018-08-02 ENCOUNTER — Ambulatory Visit: Payer: Medicare Other | Admitting: Physical Therapy

## 2018-08-02 ENCOUNTER — Encounter: Payer: Self-pay | Admitting: Physical Therapy

## 2018-08-02 DIAGNOSIS — M6281 Muscle weakness (generalized): Secondary | ICD-10-CM

## 2018-08-02 DIAGNOSIS — M25611 Stiffness of right shoulder, not elsewhere classified: Secondary | ICD-10-CM

## 2018-08-02 DIAGNOSIS — M4126 Other idiopathic scoliosis, lumbar region: Secondary | ICD-10-CM | POA: Diagnosis not present

## 2018-08-02 DIAGNOSIS — M25511 Pain in right shoulder: Secondary | ICD-10-CM
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
## 2018-08-04 ENCOUNTER — Encounter: Payer: Medicare Other | Admitting: Physical Therapy

## 2018-08-09 ENCOUNTER — Ambulatory Visit (INDEPENDENT_AMBULATORY_CARE_PROVIDER_SITE_OTHER): Payer: Medicare Other | Admitting: Podiatry

## 2018-08-09 ENCOUNTER — Ambulatory Visit: Payer: Medicare Other | Admitting: Physical Therapy

## 2018-08-09 ENCOUNTER — Encounter: Payer: Self-pay | Admitting: Podiatry

## 2018-08-09 DIAGNOSIS — B351 Tinea unguium: Secondary | ICD-10-CM | POA: Diagnosis not present

## 2018-08-09 DIAGNOSIS — M25511 Pain in right shoulder: Secondary | ICD-10-CM | POA: Diagnosis not present

## 2018-08-09 DIAGNOSIS — M25611 Stiffness of right shoulder, not elsewhere classified: Secondary | ICD-10-CM | POA: Diagnosis not present

## 2018-08-09 DIAGNOSIS — M79676 Pain in unspecified toe(s): Secondary | ICD-10-CM | POA: Diagnosis not present

## 2018-08-09 DIAGNOSIS — M6281 Muscle weakness (generalized): Secondary | ICD-10-CM

## 2018-08-09 DIAGNOSIS — M4126 Other idiopathic scoliosis, lumbar region: Secondary | ICD-10-CM

## 2018-08-10 ENCOUNTER — Encounter: Payer: Self-pay | Admitting: Physical Therapy
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
## 2018-08-16 ENCOUNTER — Encounter: Payer: Medicare Other | Admitting: Physical Therapy

## 2018-08-18 ENCOUNTER — Other Ambulatory Visit: Payer: Self-pay | Admitting: Family Medicine

## 2018-08-18 NOTE — Telephone Encounter (Signed)
Last office visit 06/16/18 for Clogged Ear.  Last refilled 05/16/2018 for #90 with no refills. Next Appt: 11/15/2018 for CPE.

## 2018-08-23 ENCOUNTER — Encounter: Payer: Medicare Other | Admitting: Physical Therapy

## 2018-08-26 ENCOUNTER — Other Ambulatory Visit: Payer: Self-pay | Admitting: *Deleted

## 2018-08-26 MED ORDER — LOSARTAN POTASSIUM 50 MG PO TABS: 50.0000 mg | ORAL_TABLET | Freq: Every day | ORAL | 11 refills | Status: DC

## 2018-08-26 NOTE — Telephone Encounter (Signed)
Spoke to pt who states she is out of town and is needing a refill of losartan. Rx expired 01/2018 but pt states she had pills but d/c while she was recovering from surgery, which is why she still had tabs remaining. Requesting Rx be sent to [facility]

## 2018-08-29 ENCOUNTER — Ambulatory Visit: Payer: Medicare Other | Admitting: Urology

## 2018-08-31 ENCOUNTER — Ambulatory Visit: Payer: Medicare Other | Attending: Internal Medicine | Admitting: Physical Therapy

## 2018-08-31 DIAGNOSIS — M25611 Stiffness of right shoulder, not elsewhere classified: Secondary | ICD-10-CM | POA: Insufficient documentation

## 2018-08-31 DIAGNOSIS — M6281 Muscle weakness (generalized): Secondary | ICD-10-CM | POA: Insufficient documentation

## 2018-08-31 DIAGNOSIS — M25511 Pain in right shoulder: Secondary | ICD-10-CM | POA: Insufficient documentation

## 2018-09-07 ENCOUNTER — Encounter: Payer: Self-pay | Admitting: Physical Therapy

## 2018-09-07 ENCOUNTER — Ambulatory Visit: Payer: Medicare Other | Admitting: Physical Therapy

## 2018-09-07 DIAGNOSIS — M25511 Pain in right shoulder: Secondary | ICD-10-CM

## 2018-09-07 DIAGNOSIS — M6281 Muscle weakness (generalized): Secondary | ICD-10-CM | POA: Diagnosis not present

## 2018-09-07 DIAGNOSIS — M25611 Stiffness of right shoulder, not elsewhere classified: Secondary | ICD-10-CM | POA: Diagnosis not present
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
## 2018-09-13 ENCOUNTER — Ambulatory Visit (INDEPENDENT_AMBULATORY_CARE_PROVIDER_SITE_OTHER): Payer: Medicare Other | Admitting: *Deleted

## 2018-09-13 DIAGNOSIS — T63441D Toxic effect of venom of bees, accidental (unintentional), subsequent encounter: Secondary | ICD-10-CM

## 2018-09-14 ENCOUNTER — Ambulatory Visit: Payer: Medicare Other | Attending: Internal Medicine | Admitting: Physical Therapy

## 2018-09-14 ENCOUNTER — Encounter: Payer: Self-pay | Admitting: Physical Therapy

## 2018-09-14 DIAGNOSIS — M25511 Pain in right shoulder: Secondary | ICD-10-CM | POA: Diagnosis not present

## 2018-09-14 DIAGNOSIS — M6281 Muscle weakness (generalized): Secondary | ICD-10-CM | POA: Diagnosis not present

## 2018-09-14 DIAGNOSIS — M4126 Other idiopathic scoliosis, lumbar region: Secondary | ICD-10-CM

## 2018-09-14 DIAGNOSIS — M25611 Stiffness of right shoulder, not elsewhere classified: Secondary | ICD-10-CM | POA: Diagnosis not present
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
## 2018-09-19 ENCOUNTER — Ambulatory Visit (INDEPENDENT_AMBULATORY_CARE_PROVIDER_SITE_OTHER): Payer: Medicare Other | Admitting: Urology

## 2018-09-19 VITALS — BP 155/110 | HR 69 | Ht 63.0 in | Wt 156.0 lb

## 2018-09-19 DIAGNOSIS — N3946 Mixed incontinence: Secondary | ICD-10-CM | POA: Diagnosis not present

## 2018-09-19 DIAGNOSIS — N3941 Urge incontinence: Secondary | ICD-10-CM

## 2018-09-19 LAB — URINALYSIS, COMPLETE
Bilirubin, UA: NEGATIVE
Glucose, UA: NEGATIVE
Ketones, UA: NEGATIVE
Leukocytes, UA: NEGATIVE
NITRITE UA: NEGATIVE
PH UA: 6 (ref 5.0–7.5)
Protein, UA: NEGATIVE
RBC, UA: NEGATIVE
Specific Gravity, UA: 1.015 (ref 1.005–1.030)
Urobilinogen, Ur: 0.2 mg/dL (ref 0.2–1.0)

## 2018-09-19 LAB — MICROSCOPIC EXAMINATION: WBC, UA: NONE SEEN /hpf (ref 0–5)

## 2018-09-19 NOTE — Patient Instructions (Signed)
Patient Name:______________________________________  Tracking your bladder symptoms Sample: Day Daytime Voids Number of Accidents Nighttime voids Urgenty for the day (0-4) Comments  Mon IIII 4 I 1 II 2 2 0=none-4=severe I had more coffee than usual today.   Week Starting:____________________________________ Day Daytime Voids Number of Accidents Nighttime voids Urgency for the day (0-4) Comments                                                                        This week my symptoms were:  O much better  O better O the same O worse  Week Starting:____________________________________ Day Daytime Voids Number of Accidents Nighttime voids Urgency for the day (0-4) Comments                                                                        This week my symptoms were:  O much better  O better O the same O worse

## 2018-09-19 NOTE — Progress Notes (Signed)
Patient present today complaining of increased urinary incontinence. She had an interstim placed by Dr. Jacqlyn Larsen 3 or so years ago. Patient cannot find her interstim remote. Due to this only an impedance check was able to be done today. NO impedance was noted. Battery life is 22-18mo. Interstim was on and patient is currently on program 1 at 1.60 amp. Patient did feel stimulation when stimulation was increased to 1.65. Sensation was felt at box sight in buttocks rather than vaginal/pernial location. Due to her not having her remote box today her program could not be changed to find better stimulation. Patient was instructed to leave a UA today to rule out infection and to follow up in a week with her remote. Patient was also given a voiding dairy as well to track symptoms over the next week. If patient is unable to locate her remote she is to call the office and cancel her appointment. Patient was given [employer] information to contact for a replacement if needed
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
## 2018-09-21 LAB — CULTURE, URINE COMPREHENSIVE

## 2018-09-22 ENCOUNTER — Encounter: Payer: Self-pay | Admitting: Physical Therapy

## 2018-09-22 ENCOUNTER — Ambulatory Visit: Payer: Medicare Other | Admitting: Physical Therapy

## 2018-09-22 DIAGNOSIS — M25511 Pain in right shoulder: Secondary | ICD-10-CM | POA: Diagnosis not present

## 2018-09-22 DIAGNOSIS — M25611 Stiffness of right shoulder, not elsewhere classified: Secondary | ICD-10-CM

## 2018-09-22 DIAGNOSIS — M6281 Muscle weakness (generalized): Secondary | ICD-10-CM

## 2018-09-22 DIAGNOSIS — M4126 Other idiopathic scoliosis, lumbar region: Secondary | ICD-10-CM | POA: Diagnosis not present
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
## 2018-09-26 ENCOUNTER — Encounter: Payer: Self-pay | Admitting: Urology

## 2018-09-26 ENCOUNTER — Ambulatory Visit: Payer: Medicare Other | Admitting: Urology

## 2018-09-26 VITALS — BP 177/91 | HR 69 | Ht 63.0 in | Wt 159.9 lb

## 2018-09-29 ENCOUNTER — Encounter: Payer: Self-pay | Admitting: Physical Therapy

## 2018-09-29 ENCOUNTER — Ambulatory Visit: Payer: Medicare Other | Admitting: Physical Therapy

## 2018-09-29 DIAGNOSIS — M25511 Pain in right shoulder: Secondary | ICD-10-CM

## 2018-09-29 DIAGNOSIS — M6281 Muscle weakness (generalized): Secondary | ICD-10-CM | POA: Diagnosis not present

## 2018-09-29 DIAGNOSIS — M25611 Stiffness of right shoulder, not elsewhere classified: Secondary | ICD-10-CM

## 2018-09-29 DIAGNOSIS — M4126 Other idiopathic scoliosis, lumbar region: Secondary | ICD-10-CM | POA: Diagnosis not present
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
## 2018-09-30 ENCOUNTER — Other Ambulatory Visit: Payer: Self-pay | Admitting: Family Medicine

## 2018-10-10 ENCOUNTER — Other Ambulatory Visit: Payer: Self-pay | Admitting: *Deleted

## 2018-10-10 MED ORDER — LOSARTAN POTASSIUM 25 MG PO TABS: 50.0000 mg | ORAL_TABLET | Freq: Every day | ORAL | 0 refills | Status: DC

## 2018-10-10 NOTE — Telephone Encounter (Signed)
Received fax from [facility] stating Losartan 50 mg is on backorder.  Asking for an alternative.  They have 100 mg and 25 mg in stock.  Discussed with Dr. Oretha Caprice to send in new Rx for Losartan 25 mg to take 2 tablets daily. Rx sent to [facility] in [place] as instructed.

## 2018-10-11 ENCOUNTER — Ambulatory Visit (INDEPENDENT_AMBULATORY_CARE_PROVIDER_SITE_OTHER): Payer: Medicare Other | Admitting: *Deleted

## 2018-10-11 DIAGNOSIS — T63441D Toxic effect of venom of bees, accidental (unintentional), subsequent encounter: Secondary | ICD-10-CM | POA: Diagnosis not present

## 2018-10-25 ENCOUNTER — Telehealth: Payer: Self-pay

## 2018-10-25 NOTE — Telephone Encounter (Signed)
No documentation that a call was made to patient from our office.  Will forward to Dr. Diona Browner to make sure she has not tried to reach patient for any reason.

## 2018-10-25 NOTE — Telephone Encounter (Signed)
[name] - Client Nonclinical Telephone Record [facility] Night - Client Client Site [place] - Night Contact Type Call Who Is Calling Patient / Member / Family / Caregiver Caller Name [place] Phone Number [phone] Call Type Message Only Information Provided Reason for Call Returning a Call from the Office Initial Sherri Murray states that they are calling about having missed a call from the office. Additional Comment DOB:Dec 30, 1940. Office hours provided. Call Closed By: Graciella Belton Transaction Date/Time: 10/24/2018 5:42:34 PM (ET)

## 2018-10-25 NOTE — Telephone Encounter (Signed)
I have not called her  ?

## 2018-10-26 DIAGNOSIS — M180 Bilateral primary osteoarthritis of first carpometacarpal joints: Secondary | ICD-10-CM | POA: Diagnosis not present

## 2018-10-28 ENCOUNTER — Encounter: Payer: Self-pay | Admitting: Podiatry

## 2018-10-28 ENCOUNTER — Ambulatory Visit (INDEPENDENT_AMBULATORY_CARE_PROVIDER_SITE_OTHER): Payer: Medicare Other | Admitting: Podiatry

## 2018-10-28 DIAGNOSIS — M79676 Pain in unspecified toe(s): Secondary | ICD-10-CM

## 2018-10-28 DIAGNOSIS — B351 Tinea unguium: Secondary | ICD-10-CM

## 2018-10-28 DIAGNOSIS — M7752 Other enthesopathy of left foot: Secondary | ICD-10-CM

## 2018-10-28 DIAGNOSIS — M779 Enthesopathy, unspecified: Secondary | ICD-10-CM

## 2018-10-28 DIAGNOSIS — M7751 Other enthesopathy of right foot: Secondary | ICD-10-CM

## 2018-10-28 DIAGNOSIS — M778 Other enthesopathies, not elsewhere classified: Secondary | ICD-10-CM

## 2018-10-28 MED ORDER — MELOXICAM 15 MG PO TABS: 15.0000 mg | ORAL_TABLET | Freq: Every day | ORAL | 0 refills | Status: DC
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
## 2018-11-08 ENCOUNTER — Telehealth: Payer: Self-pay | Admitting: Family Medicine

## 2018-11-08 DIAGNOSIS — E890 Postprocedural hypothyroidism: Secondary | ICD-10-CM

## 2018-11-08 DIAGNOSIS — R7303 Prediabetes: Secondary | ICD-10-CM

## 2018-11-08 DIAGNOSIS — E7800 Pure hypercholesterolemia, unspecified: Secondary | ICD-10-CM

## 2018-11-08 NOTE — Telephone Encounter (Signed)
-----   Message from Eustace Pen, LPN sent at 04/09/4764  3:15 PM EST ----- Regarding: Labs 1/29 Lab orders needed. Thank you.

## 2018-11-09 ENCOUNTER — Ambulatory Visit: Payer: Medicare Other

## 2018-11-09 ENCOUNTER — Ambulatory Visit (INDEPENDENT_AMBULATORY_CARE_PROVIDER_SITE_OTHER): Payer: Medicare Other

## 2018-11-09 VITALS — BP 140/80 | HR 57 | Temp 98.0°F | Ht 63.0 in | Wt 155.5 lb

## 2018-11-09 DIAGNOSIS — E890 Postprocedural hypothyroidism: Secondary | ICD-10-CM

## 2018-11-09 DIAGNOSIS — E7800 Pure hypercholesterolemia, unspecified: Secondary | ICD-10-CM | POA: Diagnosis not present

## 2018-11-09 DIAGNOSIS — R7303 Prediabetes: Secondary | ICD-10-CM | POA: Diagnosis not present

## 2018-11-09 DIAGNOSIS — Z0000 Encounter for general adult medical examination without abnormal findings: Secondary | ICD-10-CM

## 2018-11-09 LAB — LIPID PANEL
Cholesterol: 149 mg/dL (ref 0–200)
HDL: 59.6 mg/dL (ref >39.00)
LDL Cholesterol: 77 mg/dL (ref 0–99)
NonHDL: 89.08
Total CHOL/HDL Ratio: 2
Triglycerides: 62 mg/dL (ref 0.0–149.0)
VLDL: 12.4 mg/dL (ref 0.0–40.0)

## 2018-11-09 LAB — COMPREHENSIVE METABOLIC PANEL
ALT: 19 U/L (ref 0–35)
AST: 25 U/L (ref 0–37)
Albumin: 4.3 g/dL (ref 3.5–5.2)
Alkaline Phosphatase: 54 U/L (ref 39–117)
BUN: 15 mg/dL (ref 6–23)
CALCIUM: 9.9 mg/dL (ref 8.4–10.5)
CO2: 27 mEq/L (ref 19–32)
Chloride: 103 mEq/L (ref 96–112)
Creatinine, Ser: 0.76 mg/dL (ref 0.40–1.20)
GFR: 73.49 mL/min (ref >60.00)
Glucose, Bld: 95 mg/dL (ref 70–99)
Potassium: 4.3 mEq/L (ref 3.5–5.1)
Sodium: 136 mEq/L (ref 135–145)
Total Bilirubin: 0.5 mg/dL (ref 0.2–1.2)
Total Protein: 7.2 g/dL (ref 6.0–8.3)

## 2018-11-09 LAB — T3, FREE: T3 FREE: 3 pg/mL (ref 2.3–4.2)

## 2018-11-09 LAB — TSH: TSH: 2.71 u[IU]/mL (ref 0.35–4.50)

## 2018-11-09 LAB — T4, FREE: Free T4: 0.76 ng/dL (ref 0.60–1.60)

## 2018-11-09 LAB — HEMOGLOBIN A1C: Hgb A1c MFr Bld: 5.6 % (ref 4.6–6.5)

## 2018-11-09 NOTE — Patient Instructions (Signed)
Sherri Murray , Thank you for taking time to come for your Medicare Wellness Visit. I appreciate your ongoing commitment to your health goals. Please review the following plan we discussed and let me know if I can assist you in the future.   These are the goals we discussed: Goals    . Follow up with Primary Care Provider     Starting 11/09/2018, I will continue to take medications as prescribed and to keep appointment with PCP as scheduled.        This is a list of the screening recommended for you and due dates:  Health Maintenance  Topic Date Due  . Tetanus Vaccine  10/12/2020  . Flu Shot  Completed  . DEXA scan (bone density measurement)  Completed  . Pneumonia vaccines  Completed   Preventive Care for Adults  A healthy lifestyle and preventive care can promote health and wellness. Preventive health guidelines for adults include the following key practices.  . A routine yearly physical is a good way to check with your health care provider about your health and preventive screening. It is a chance to share any concerns and updates on your health and to receive a thorough exam.  . Visit your dentist for a routine exam and preventive care every 6 months. Brush your teeth twice a day and floss once a day. Good oral hygiene prevents tooth decay and gum disease.  . The frequency of eye exams is based on your age, health, family medical history, use  of contact lenses, and other factors. Follow your health care provider's recommendations for frequency of eye exams.  . Eat a healthy diet. Foods like vegetables, fruits, whole grains, low-fat dairy products, and lean protein foods contain the nutrients you need without too many calories. Decrease your intake of foods high in solid fats, added sugars, and salt. Eat the right amount of calories for you. Get information about a proper diet from your health care provider, if necessary.  . Regular physical exercise is one of the most important  things you can do for your health. Most adults should get at least 150 minutes of moderate-intensity exercise (any activity that increases your heart rate and causes you to sweat) each week. In addition, most adults need muscle-strengthening exercises on 2 or more days a week.  Silver Sneakers may be a benefit available to you. To determine eligibility, you may visit the website: www.silversneakers.com or contact program at 910-243-2100 Mon-Fri between 8AM-8PM.   . Maintain a healthy weight. The body mass index (BMI) is a screening tool to identify possible weight problems. It provides an estimate of body fat based on height and weight. Your health care provider can find your BMI and can help you achieve or maintain a healthy weight.   For adults 20 years and older: ? A BMI below 18.5 is considered underweight. ? A BMI of 18.5 to 24.9 is normal. ? A BMI of 25 to 29.9 is considered overweight. ? A BMI of 30 and above is considered obese.   . Maintain normal blood lipids and cholesterol levels by exercising and minimizing your intake of saturated fat. Eat a balanced diet with plenty of fruit and vegetables. Blood tests for lipids and cholesterol should begin at age 33 and be repeated every 5 years. If your lipid or cholesterol levels are high, you are over 50, or you are at high risk for heart disease, you may need your cholesterol levels checked more frequently. Ongoing high lipid  and cholesterol levels should be treated with medicines if diet and exercise are not working.  . If you smoke, find out from your health care provider how to quit. If you do not use tobacco, please do not start.  . If you choose to drink alcohol, please do not consume more than 2 drinks per day. One drink is considered to be 12 ounces (355 mL) of beer, 5 ounces (148 mL) of wine, or 1.5 ounces (44 mL) of liquor.  . If you are 54-4 years old, ask your health care provider if you should take aspirin to prevent  strokes.  . Use sunscreen. Apply sunscreen liberally and repeatedly throughout the day. You should seek shade when your shadow is shorter than you. Protect yourself by wearing long sleeves, pants, a wide-brimmed hat, and sunglasses year round, whenever you are outdoors.  . Once a month, do a whole body skin exam, using a mirror to look at the skin on your back. Tell your health care provider of new moles, moles that have irregular borders, moles that are larger than a pencil eraser, or moles that have changed in shape or color.

## 2018-11-09 NOTE — Progress Notes (Signed)
I reviewed health advisor's note, was available for consultation, and agree with documentation and plan.   Signed,  Okie Bogacz T. Dorsel Flinn, MD  
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
## 2018-11-09 NOTE — Progress Notes (Signed)
PCP notes:   Health maintenance:  No gaps identified.  Abnormal screenings:   Hearing - failed  Hearing Screening   125Hz  250Hz  500Hz  1000Hz  2000Hz  3000Hz  4000Hz  6000Hz  8000Hz   Right ear:   40 0 0  0    Left ear:   0 0 0  0     Mini-Cog score: 19/20 MMSE - Mini Mental State Exam 11/09/2018 11/08/2017 10/16/2016  Orientation to time 5 5 5   Orientation to Place 5 5 5   Registration 3 3 3   Attention/ Calculation 0 0 0  Recall 2 3 3   Recall-comments unable to recall 1 of 3 words - -  Language- name 2 objects 0 0 0  Language- repeat 1 1 1   Language- follow 3 step command 3 2 3   Language- follow 3 step command-comments - unable to follow 1 step of 3 step command -  Language- read & follow direction 0 0 0  Write a sentence 0 0 0  Copy design 0 0 0  Total score 19 19 20    Fall risk - hx of multiple falls Fall Risk  11/09/2018 11/08/2017 10/16/2016 05/01/2014 04/21/2013  Falls in the past year? 1 Yes Yes No Yes  Comment multiple falls due to unsteady gait - pt has had multiple falls with injury - -  Number falls in past yr: 1 2 or more 2 or more - 2 or more  Injury with Fall? 1 Yes Yes - -  Risk Factor Category  - High Fall Risk - - -  Risk for fall due to : History of fall(s);Impaired balance/gait;Impaired mobility Impaired balance/gait - - History of fall(s)     Patient concerns:   None  Nurse concerns:  None  Next PCP appt:   11/15/18 @ 1100

## 2018-11-11 ENCOUNTER — Ambulatory Visit: Payer: Medicare Other | Admitting: Podiatry

## 2018-11-14 ENCOUNTER — Other Ambulatory Visit: Payer: Self-pay | Admitting: Family Medicine

## 2018-11-14 DIAGNOSIS — Z1231 Encounter for screening mammogram for malignant neoplasm of breast: Secondary | ICD-10-CM

## 2018-11-15 ENCOUNTER — Ambulatory Visit (INDEPENDENT_AMBULATORY_CARE_PROVIDER_SITE_OTHER): Payer: Medicare Other | Admitting: Family Medicine

## 2018-11-15 ENCOUNTER — Encounter: Payer: Self-pay | Admitting: Family Medicine

## 2018-11-15 VITALS — BP 110/60 | HR 55 | Temp 97.6°F | Ht 63.0 in | Wt 155.2 lb

## 2018-11-15 DIAGNOSIS — E890 Postprocedural hypothyroidism: Secondary | ICD-10-CM

## 2018-11-15 DIAGNOSIS — B379 Candidiasis, unspecified: Secondary | ICD-10-CM | POA: Diagnosis not present

## 2018-11-15 DIAGNOSIS — R32 Unspecified urinary incontinence: Secondary | ICD-10-CM | POA: Diagnosis not present

## 2018-11-15 DIAGNOSIS — E7800 Pure hypercholesterolemia, unspecified: Secondary | ICD-10-CM

## 2018-11-15 DIAGNOSIS — Z0000 Encounter for general adult medical examination without abnormal findings: Secondary | ICD-10-CM | POA: Diagnosis not present

## 2018-11-15 DIAGNOSIS — R7303 Prediabetes: Secondary | ICD-10-CM | POA: Diagnosis not present

## 2018-11-15 DIAGNOSIS — M797 Fibromyalgia: Secondary | ICD-10-CM

## 2018-11-15 DIAGNOSIS — I10 Essential (primary) hypertension: Secondary | ICD-10-CM

## 2018-11-15 MED ORDER — NYSTATIN 100000 UNIT/GM EX CREA: TOPICAL_CREAM | CUTANEOUS | 3 refills | Status: DC

## 2018-11-15 NOTE — Patient Instructions (Signed)
Treat yeast infection with nystatin.. call if not improving.   Call pharmacy to have urinary medication refilled/get name.  work on healthy eating and increase exercise as able.

## 2018-11-15 NOTE — Assessment & Plan Note (Signed)
Followed by Dr Deveshwar 

## 2018-11-15 NOTE — Assessment & Plan Note (Signed)
Well controlled. Continue current medication.  

## 2018-11-15 NOTE — Progress Notes (Signed)
Subjective:    Patient ID: Sherri Murray, female    DOB: 1941-08-31, 77 y.o.   MRN: [id number]  HPI The patient presents for annual medicare wellness, complete physical and review of chronic health problems. He/She also has the following acute concerns today:none  The patient saw Candis Musa, LPN for medicare wellness. Note reviewed in detail and important notes copied below.  Health maintenance:  No gaps identified.  Abnormal screenings:   Hearing - failed             Hearing Screening   125Hz  250Hz  500Hz  1000Hz  2000Hz  3000Hz  4000Hz  6000Hz  8000Hz   Right ear:   40 0 0  0    Left ear:   0 0 0  0     Mini-Cog score: 19/20 MMSE - Mini Mental State Exam 11/09/2018 11/08/2017 10/16/2016  Orientation to time 5 5 5   Orientation to Place 5 5 5   Registration 3 3 3   Attention/ Calculation 0 0 0  Recall 2 3 3   Recall-comments unable to recall 1 of 3 words - -  Language- name 2 objects 0 0 0  Language- repeat 1 1 1   Language- follow 3 step command 3 2 3   Language- follow 3 step command-comments - unable to follow 1 step of 3 step command -  Language- read & follow direction 0 0 0  Write a sentence 0 0 0  Copy design 0 0 0  Total score 19 19 20    Fall risk - hx of multiple falls Fall Risk  11/09/2018 11/08/2017 10/16/2016 05/01/2014 04/21/2013  Falls in the past year? 1 Yes Yes No Yes  Comment multiple falls due to unsteady gait - pt has had multiple falls with injury - -  Number falls in past yr: 1 2 or more 2 or more - 2 or more  Injury with Fall? 1 Yes Yes - -  Risk Factor Category  - High Fall Risk - - -  Risk for fall due to : History of fall(s);Impaired balance/gait;Impaired mobility Impaired balance/gait - - History of fall(s)   Dec 06, 2018  He fiance died in the last 2 months.. she is trying to handle her mood.  Sleeping okay at night.  Needs a refill of nystatin for recurrent candidal infection in diaper area where she uses pads.   Fibromyalgia:  Followed by Dr. Estanislado Pandy.  Hypertension:  At goal on losartan HCTZ Using medication without problems or lightheadedness:  none Chest pain with exertion:none ( occ pain at rest, momentary.. 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  Good control Lab Results  Component Value Date   TSH 2.71 11/09/2018    Elevated Cholesterol: LDL At goal on statin  Lab Results  Component Value Date   CHOL 149 11/09/2018   HDL 59.60 11/09/2018   LDLCALC 77 11/09/2018   LDLDIRECT 136.1 11/02/2013   TRIG 62.0 11/09/2018   CHOLHDL 2 11/09/2018  Using medications without problems: Muscle aches:  Diet compliance: moderate Exercise: joining silver sneakers Other complaints:    Social History /Family History/Past Medical History reviewed in detail and updated in EMR if needed. Blood pressure 110/60, pulse (!) 55, temperature 97.6 F (36.4 C), temperature source Oral, height 5\' 3"  (1.6 m), weight 155 lb 4 oz (70.4 kg), SpO2 95 %.  Review of Systems  Constitutional: Negative for fatigue and fever.  HENT: Negative for congestion.   Eyes: Negative for pain.  Respiratory: Negative for cough and shortness of breath.   Cardiovascular: Negative for chest pain, palpitations and leg swelling.  Gastrointestinal: Negative for abdominal pain.  Genitourinary: Negative for dysuria and vaginal bleeding.  Musculoskeletal: Negative for back pain.  Neurological: Negative for syncope, light-headedness and headaches.  Psychiatric/Behavioral: Negative for dysphoric mood.       Objective:   Physical Exam Constitutional:      General: She is not in acute distress.    Appearance: Normal appearance. She is well-developed. She is not ill-appearing or toxic-appearing.     Comments:  overweight  HENT:     Head: Normocephalic.     Right Ear: Hearing, tympanic membrane, ear canal and external ear normal.     Left Ear: Hearing, tympanic membrane, ear canal and external ear normal.     Nose: Nose normal.  Eyes:     General: Lids are normal. Lids are everted, no foreign bodies appreciated.     Conjunctiva/sclera: Conjunctivae normal.     Pupils: Pupils are equal, round, and reactive to light.  Neck:     Musculoskeletal: Normal range of motion and neck supple.     Thyroid: No thyroid mass or thyromegaly.     Vascular: No carotid bruit.     Trachea: Trachea normal.  Cardiovascular:     Rate and Rhythm: Normal rate and regular rhythm.     Heart sounds: Normal heart sounds, S1 normal and S2 normal. No murmur. No gallop.   Pulmonary:     Effort: Pulmonary effort is normal. No respiratory distress.     Breath sounds: Normal breath sounds. No wheezing, rhonchi or rales.  Abdominal:     General: Bowel sounds are normal. There is no distension or abdominal bruit.     Palpations: Abdomen is soft. There is no fluid wave or mass.     Tenderness: There is no abdominal tenderness. There is no guarding or rebound.     Hernia: No hernia is present.  Musculoskeletal:     Right shoulder: She exhibits decreased range of motion.  Lymphadenopathy:     Cervical: No cervical adenopathy.  Skin:    General: Skin is warm and dry.     Findings: No rash.  Neurological:     Mental Status: She is alert.     Cranial Nerves: No cranial nerve deficit.     Sensory: No sensory deficit.  Psychiatric:        Mood and Affect: Mood is not anxious or depressed.        Speech: Speech normal.        Behavior: Behavior normal. Behavior is cooperative.        Judgment: Judgment normal.           Assessment & Plan:  The patient's preventative maintenance and recommended screening tests for an annual wellness exam were reviewed in full today. Brought up to date unless services  declined.  Counselled on the importance of diet, exercise, and its role in overall health and mortality. The patient's FH and SH was reviewed, including their home life, tobacco status, and drug and alcohol status.   Vaccines: uptodate Pap/DVE:  Not indicated Mammo:  Schedule 12/2017.Marland Kitchenupcoming Bone Density: 2015.Marland Kitchen Plan repeat in 5 years... 2021 Colon:  2013 G[facility]ner.. No recall given age. Smoking Status: none

## 2018-11-15 NOTE — Assessment & Plan Note (Signed)
Medication helping well.. she will with the name for refills.

## 2018-11-15 NOTE — Assessment & Plan Note (Signed)
Well controlled. Continue current medication. Continue statin.

## 2018-11-15 NOTE — Assessment & Plan Note (Signed)
Stable TSH following partial thyroidectomy.

## 2018-11-15 NOTE — Assessment & Plan Note (Signed)
Improved control . Encouraged exercise, weight loss, healthy eating habits.

## 2018-11-16 ENCOUNTER — Ambulatory Visit (INDEPENDENT_AMBULATORY_CARE_PROVIDER_SITE_OTHER): Payer: Medicare Other | Admitting: *Deleted

## 2018-11-16 DIAGNOSIS — T63441D Toxic effect of venom of bees, accidental (unintentional), subsequent encounter: Secondary | ICD-10-CM | POA: Diagnosis not present

## 2018-11-29 ENCOUNTER — Other Ambulatory Visit: Payer: Self-pay | Admitting: Family Medicine

## 2018-12-15 ENCOUNTER — Ambulatory Visit: Payer: Medicare Other

## 2018-12-22 ENCOUNTER — Ambulatory Visit (INDEPENDENT_AMBULATORY_CARE_PROVIDER_SITE_OTHER): Payer: Medicare Other | Admitting: *Deleted

## 2018-12-22 DIAGNOSIS — T63441D Toxic effect of venom of bees, accidental (unintentional), subsequent encounter: Secondary | ICD-10-CM | POA: Diagnosis not present

## 2018-12-28 ENCOUNTER — Other Ambulatory Visit: Payer: Self-pay | Admitting: Family Medicine

## 2019-01-06 ENCOUNTER — Other Ambulatory Visit: Payer: Self-pay | Admitting: *Deleted

## 2019-01-06 ENCOUNTER — Other Ambulatory Visit: Payer: Self-pay | Admitting: Family Medicine

## 2019-01-06 MED ORDER — OLOPATADINE HCL 0.2 % OP SOLN: 1.0000 [drp] | Freq: Two times a day (BID) | OPHTHALMIC | 5 refills | Status: DC | PRN

## 2019-01-10 ENCOUNTER — Ambulatory Visit: Payer: Medicare Other

## 2019-01-19 ENCOUNTER — Ambulatory Visit: Payer: Self-pay

## 2019-01-20 ENCOUNTER — Ambulatory Visit: Payer: Medicare Other | Admitting: Podiatry

## 2019-01-25 ENCOUNTER — Ambulatory Visit (INDEPENDENT_AMBULATORY_CARE_PROVIDER_SITE_OTHER): Payer: Medicare Other

## 2019-01-25 DIAGNOSIS — T63441D Toxic effect of venom of bees, accidental (unintentional), subsequent encounter: Secondary | ICD-10-CM

## 2019-01-25 DIAGNOSIS — M19042 Primary osteoarthritis, left hand: Secondary | ICD-10-CM | POA: Diagnosis not present

## 2019-01-25 DIAGNOSIS — M180 Bilateral primary osteoarthritis of first carpometacarpal joints: Secondary | ICD-10-CM | POA: Diagnosis not present

## 2019-01-25 NOTE — Progress Notes (Signed)
Patient stated that she is taking care of her son who has ALS and will come in when she can close to the time for her next injection. No appt made

## 2019-01-26 ENCOUNTER — Telehealth: Payer: Self-pay | Admitting: Cardiovascular Disease

## 2019-01-26 NOTE — Telephone Encounter (Signed)
Virtual Visit Pre-Appointment Phone Call  Steps For Call:  1. Confirm consent - "In the setting of the current Covid19 crisis, you are scheduled for a (phone or video) visit with your provider on (date) at (time).  Just as we do with many in-office visits, in order for you to participate in this visit, we must obtain consent.  If you'd like, I can send this to your mychart (if signed up) or email for you to review.  Otherwise, I can obtain your verbal consent now.  All virtual visits are billed to your insurance company just like a normal visit would be.  By agreeing to a virtual visit, we'd like you to understand that the technology does not allow for your provider to perform an examination, and thus may limit your provider's ability to fully assess your condition.  Finally, though the technology is pretty good, we cannot assure that it will always work on either your or our end, and in the setting of a video visit, we may have to convert it to a phone-only visit.  In either situation, we cannot ensure that we have a secure connection.  Are you willing to proceed?" STAFF: Did the patient verbally acknowledge consent to telehealth visit? Document YES/NO here: YES  2. Confirm the BEST phone number to call the day of the visit by including in appointment notes  3. Give patient instructions for WebEx/MyChart download to smartphone as below or Doximity/Doxy.me if video visit (depending on what platform provider is using)  4. Advise patient to be prepared with their blood pressure, heart rate, weight, any heart rhythm information, their current medicines, and a piece of paper and pen handy for any instructions they may receive the day of their visit  5. Inform patient they will receive a phone call 15 minutes prior to their appointment time (may be from unknown caller ID) so they should be prepared to answer  6. Confirm that appointment type is correct in Epic appointment notes (VIDEO vs  PHONE)     TELEPHONE CALL NOTE  Sherri Murray has been deemed a candidate for a follow-up tele-health visit to limit community exposure during the Covid-19 pandemic. I spoke with the patient via phone to ensure availability of phone/video source, confirm preferred email & phone number, and discuss instructions and expectations.  I reminded Sherri Murray to be prepared with any vital sign and/or heart rhythm information that could potentially be obtained via home monitoring, at the time of her visit. I reminded Sherri Murray to expect a phone call at the time of her visit if her visit.  Sherri Murray 01/26/2019 1:36 PM   INSTRUCTIONS FOR DOWNLOADING THE [place] APP TO SMARTPHONE  - If Apple, ask patient to go to App Store and type in WebEx in the search bar. [employer], the blue/green circle. If Android, go to [employer] and type in [employer] in the search bar. The app is free but as with any other app downloads, their phone may require them to verify saved payment information or Apple/Android password.  - The patient does NOT have to create an account. - On the day of the visit, the assist will walk the patient through joining the meeting with the meeting number/password.  INSTRUCTIONS FOR DOWNLOADING THE MYCHART APP TO SMARTPHONE  - The patient must first make sure to have activated MyChart and know their login information - If Apple, go to [employer] and type in  MyChart in the search bar and download the app. If Android, ask patient to go to [employer] and type in [place] in the search bar and download the app. The app is free but as with any other app downloads, their phone may require them to verify saved payment information or Apple/Android password.  - The patient will need to then log into the app with their MyChart username and password, and select  as their healthcare provider to link the account. When it is time for your  visit, go to the MyChart app, find appointments, and click Begin Video Visit. Be sure to Select Allow for your device to access the Microphone and Camera for your visit. You will then be connected, and your provider will be with you shortly.  **If they have any issues connecting, or need assistance please contact MyChart service desk [phone])**  **If using a computer, in order to ensure the best quality for their visit they will need to use either of the following Internet Browsers: [employer], or Google Chrome**  IF USING DOXIMITY or DOXY.ME - The patient will receive a link just prior to their visit, either by text or email (to be determined day of appointment depending on if it's doxy.me or Doximity).     FULL LENGTH CONSENT FOR TELE-HEALTH VISIT   I hereby voluntarily request, consent and authorize [facility] and its employed or contracted physicians, physician assistants, nurse practitioners or other licensed health care professionals (the Practitioner), to provide me with telemedicine health care services (the Services") as deemed necessary by the treating Practitioner. I acknowledge and consent to receive the Services by the Practitioner via telemedicine. I understand that the telemedicine visit will involve communicating with the Practitioner through live audiovisual communication technology and the disclosure of certain medical information by electronic transmission. I acknowledge that I have been given the opportunity to request an in-person assessment or other available alternative prior to the telemedicine visit and am voluntarily participating in the telemedicine visit.  I understand that I have the right to withhold or withdraw my consent to the use of telemedicine in the course of my care at any time, without affecting my right to future care or treatment, and that the Practitioner or I may terminate the telemedicine visit at any time. I understand that I have  the right to inspect all information obtained and/or recorded in the course of the telemedicine visit and may receive copies of available information for a reasonable fee.  I understand that some of the potential risks of receiving the Services via telemedicine include:   Delay or interruption in medical evaluation due to technological equipment failure or disruption;  Information transmitted may not be sufficient (e.g. poor resolution of images) to allow for appropriate medical decision making by the Practitioner; and/or   In rare instances, security protocols could fail, causing a breach of personal health information.  Furthermore, I acknowledge that it is my responsibility to provide information about my medical history, conditions and care that is complete and accurate to the best of my ability. I acknowledge that Practitioner's advice, recommendations, and/or decision may be based on factors not within their control, such as incomplete or inaccurate data provided by me or distortions of diagnostic images or specimens that may result from electronic transmissions. I understand that the practice of medicine is not an exact science and that Practitioner makes no warranties or guarantees regarding treatment outcomes. I acknowledge that I will receive a copy  of this consent concurrently upon execution via email to the email address I last provided but may also request a printed copy by calling the office of [place].    I understand that my insurance will be billed for this visit.   I have read or had this consent read to me.  I understand the contents of this consent, which adequately explains the benefits and risks of the Services being provided via telemedicine.   I have been provided ample opportunity to ask questions regarding this consent and the Services and have had my questions answered to my satisfaction.  I give my informed consent for the services to be provided through the use  of telemedicine in my medical care  By participating in this telemedicine visit I agree to the above.

## 2019-02-02 ENCOUNTER — Other Ambulatory Visit: Payer: Self-pay

## 2019-02-02 ENCOUNTER — Telehealth (INDEPENDENT_AMBULATORY_CARE_PROVIDER_SITE_OTHER): Payer: Medicare Other | Admitting: Cardiovascular Disease

## 2019-02-02 DIAGNOSIS — E7800 Pure hypercholesterolemia, unspecified: Secondary | ICD-10-CM | POA: Diagnosis not present

## 2019-02-02 DIAGNOSIS — I491 Atrial premature depolarization: Secondary | ICD-10-CM

## 2019-02-02 DIAGNOSIS — M797 Fibromyalgia: Secondary | ICD-10-CM | POA: Diagnosis not present

## 2019-02-02 DIAGNOSIS — I340 Nonrheumatic mitral (valve) insufficiency: Secondary | ICD-10-CM | POA: Diagnosis not present

## 2019-02-02 DIAGNOSIS — I10 Essential (primary) hypertension: Secondary | ICD-10-CM | POA: Diagnosis not present

## 2019-02-02 DIAGNOSIS — I5189 Other ill-defined heart diseases: Secondary | ICD-10-CM | POA: Diagnosis not present

## 2019-02-02 DIAGNOSIS — R7303 Prediabetes: Secondary | ICD-10-CM
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
## 2019-02-02 NOTE — Patient Instructions (Signed)

## 2019-02-14 ENCOUNTER — Ambulatory Visit: Payer: Medicare Other | Admitting: Podiatry

## 2019-02-15 ENCOUNTER — Telehealth: Payer: Self-pay

## 2019-02-15 ENCOUNTER — Other Ambulatory Visit: Payer: Self-pay | Admitting: *Deleted

## 2019-02-15 MED ORDER — OXYBUTYNIN CHLORIDE ER 10 MG PO TB24: 10.0000 mg | ORAL_TABLET | Freq: Every day | ORAL | 1 refills | Status: DC

## 2019-02-15 NOTE — Telephone Encounter (Signed)
Received fax for refill for Mometasone Furoate. Patient was last seen 07/04/2018. Patient will need an OV for refills.

## 2019-02-27 ENCOUNTER — Ambulatory Visit: Payer: Medicare Other

## 2019-02-28 ENCOUNTER — Ambulatory Visit (INDEPENDENT_AMBULATORY_CARE_PROVIDER_SITE_OTHER): Payer: Medicare Other

## 2019-02-28 DIAGNOSIS — T63441D Toxic effect of venom of bees, accidental (unintentional), subsequent encounter: Secondary | ICD-10-CM | POA: Diagnosis not present

## 2019-03-07 ENCOUNTER — Other Ambulatory Visit: Payer: Self-pay

## 2019-03-07 ENCOUNTER — Encounter: Payer: Self-pay | Admitting: Podiatry

## 2019-03-07 ENCOUNTER — Ambulatory Visit (INDEPENDENT_AMBULATORY_CARE_PROVIDER_SITE_OTHER): Payer: Medicare Other | Admitting: Podiatry

## 2019-03-07 DIAGNOSIS — M7752 Other enthesopathy of left foot: Secondary | ICD-10-CM

## 2019-03-07 DIAGNOSIS — M778 Other enthesopathies, not elsewhere classified: Secondary | ICD-10-CM

## 2019-03-07 DIAGNOSIS — B351 Tinea unguium: Secondary | ICD-10-CM | POA: Diagnosis not present

## 2019-03-07 DIAGNOSIS — M79676 Pain in unspecified toe(s): Secondary | ICD-10-CM | POA: Diagnosis not present
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
## 2019-03-14 ENCOUNTER — Ambulatory Visit: Payer: Medicare Other | Admitting: Podiatry

## 2019-03-22 ENCOUNTER — Other Ambulatory Visit: Payer: Self-pay

## 2019-03-22 MED ORDER — MELOXICAM 15 MG PO TABS: 15.0000 mg | ORAL_TABLET | Freq: Every day | ORAL | 1 refills | Status: DC

## 2019-03-22 NOTE — Telephone Encounter (Signed)
Pharmacy refill request for Meloxicam 15mg    Per Dr. Amalia Hailey verbal order, ok to refill.   Script has been sent to pharmacy
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
## 2019-04-11 ENCOUNTER — Telehealth: Payer: Self-pay

## 2019-04-11 NOTE — Telephone Encounter (Signed)
Soni NP with [facility] is with pt now; pt has had upper chest pain that radiates to the back and SOB on and off for 2 wks. Pt also has SOB upon exertion. BP now 140/80 P 64.pt is not having CP now. Pt has slight S/T which is worse at night; on 04/10/19 pt had diarrhea. No other covid symptoms, no travel and no known exposure to + covid. Pt will go to [facility] ED for eval. FYI to Dr Diona Browner.

## 2019-04-13 ENCOUNTER — Ambulatory Visit (INDEPENDENT_AMBULATORY_CARE_PROVIDER_SITE_OTHER): Payer: Medicare Other | Admitting: *Deleted

## 2019-04-13 ENCOUNTER — Ambulatory Visit
Admission: RE | Admit: 2019-04-13 | Discharge: 2019-04-13 | Disposition: A | Discharge status: Home / Self Care | Payer: Medicare Other | Source: Ambulatory Visit | Attending: Family Medicine | Admitting: Family Medicine

## 2019-04-13 ENCOUNTER — Other Ambulatory Visit: Payer: Self-pay

## 2019-04-13 DIAGNOSIS — Z1231 Encounter for screening mammogram for malignant neoplasm of breast: Secondary | ICD-10-CM

## 2019-04-13 DIAGNOSIS — T63441D Toxic effect of venom of bees, accidental (unintentional), subsequent encounter: Secondary | ICD-10-CM

## 2019-04-24 ENCOUNTER — Ambulatory Visit: Payer: Self-pay | Admitting: Rheumatology

## 2019-05-11 ENCOUNTER — Ambulatory Visit (INDEPENDENT_AMBULATORY_CARE_PROVIDER_SITE_OTHER): Payer: Medicare Other

## 2019-05-11 DIAGNOSIS — T63441D Toxic effect of venom of bees, accidental (unintentional), subsequent encounter: Secondary | ICD-10-CM

## 2019-05-16 ENCOUNTER — Other Ambulatory Visit: Payer: Self-pay | Admitting: Allergy & Immunology

## 2019-06-06 ENCOUNTER — Encounter: Payer: Self-pay | Admitting: Podiatry

## 2019-06-06 ENCOUNTER — Other Ambulatory Visit: Payer: Self-pay

## 2019-06-06 ENCOUNTER — Ambulatory Visit (INDEPENDENT_AMBULATORY_CARE_PROVIDER_SITE_OTHER): Payer: Medicare Other | Admitting: Podiatry

## 2019-06-06 DIAGNOSIS — M79676 Pain in unspecified toe(s): Secondary | ICD-10-CM | POA: Diagnosis not present

## 2019-06-06 DIAGNOSIS — M778 Other enthesopathies, not elsewhere classified: Secondary | ICD-10-CM

## 2019-06-06 DIAGNOSIS — M779 Enthesopathy, unspecified: Secondary | ICD-10-CM | POA: Diagnosis not present

## 2019-06-06 DIAGNOSIS — B351 Tinea unguium: Secondary | ICD-10-CM

## 2019-06-08 ENCOUNTER — Ambulatory Visit (INDEPENDENT_AMBULATORY_CARE_PROVIDER_SITE_OTHER): Payer: Medicare Other | Admitting: *Deleted

## 2019-06-08 DIAGNOSIS — T63441D Toxic effect of venom of bees, accidental (unintentional), subsequent encounter: Secondary | ICD-10-CM | POA: Diagnosis not present
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
## 2019-07-02 ENCOUNTER — Other Ambulatory Visit: Payer: Self-pay | Admitting: Family Medicine

## 2019-07-06 ENCOUNTER — Ambulatory Visit: Payer: Medicare Other | Admitting: Rheumatology

## 2019-07-06 ENCOUNTER — Ambulatory Visit: Payer: Medicare Other

## 2019-07-07 ENCOUNTER — Ambulatory Visit: Payer: Medicare Other

## 2019-07-08 ENCOUNTER — Other Ambulatory Visit: Payer: Self-pay | Admitting: Family Medicine

## 2019-07-11 ENCOUNTER — Ambulatory Visit (INDEPENDENT_AMBULATORY_CARE_PROVIDER_SITE_OTHER): Payer: Medicare Other

## 2019-07-11 ENCOUNTER — Ambulatory Visit: Payer: Medicare Other | Admitting: Allergy & Immunology

## 2019-07-11 ENCOUNTER — Other Ambulatory Visit: Payer: Self-pay

## 2019-07-11 ENCOUNTER — Encounter: Payer: Self-pay | Admitting: Rheumatology

## 2019-07-11 ENCOUNTER — Ambulatory Visit (INDEPENDENT_AMBULATORY_CARE_PROVIDER_SITE_OTHER): Payer: Medicare Other | Admitting: Rheumatology

## 2019-07-11 VITALS — BP 130/66 | HR 57 | Resp 14 | Ht 63.0 in | Wt 160.6 lb

## 2019-07-11 DIAGNOSIS — Z96611 Presence of right artificial shoulder joint: Secondary | ICD-10-CM

## 2019-07-11 DIAGNOSIS — M19041 Primary osteoarthritis, right hand: Secondary | ICD-10-CM

## 2019-07-11 DIAGNOSIS — M5136 Other intervertebral disc degeneration, lumbar region: Secondary | ICD-10-CM | POA: Diagnosis not present

## 2019-07-11 DIAGNOSIS — I10 Essential (primary) hypertension: Secondary | ICD-10-CM

## 2019-07-11 DIAGNOSIS — T63441D Toxic effect of venom of bees, accidental (unintentional), subsequent encounter: Secondary | ICD-10-CM

## 2019-07-11 DIAGNOSIS — M19042 Primary osteoarthritis, left hand: Secondary | ICD-10-CM

## 2019-07-11 DIAGNOSIS — Z96651 Presence of right artificial knee joint: Secondary | ICD-10-CM | POA: Diagnosis not present

## 2019-07-11 DIAGNOSIS — M8589 Other specified disorders of bone density and structure, multiple sites: Secondary | ICD-10-CM

## 2019-07-11 DIAGNOSIS — R7303 Prediabetes: Secondary | ICD-10-CM

## 2019-07-11 DIAGNOSIS — I5189 Other ill-defined heart diseases: Secondary | ICD-10-CM

## 2019-07-11 DIAGNOSIS — R2681 Unsteadiness on feet: Secondary | ICD-10-CM

## 2019-07-11 DIAGNOSIS — M797 Fibromyalgia: Secondary | ICD-10-CM

## 2019-07-11 DIAGNOSIS — K219 Gastro-esophageal reflux disease without esophagitis: Secondary | ICD-10-CM

## 2019-07-18 ENCOUNTER — Other Ambulatory Visit: Payer: Self-pay

## 2019-07-18 ENCOUNTER — Encounter: Payer: Self-pay | Admitting: Allergy & Immunology

## 2019-07-18 ENCOUNTER — Ambulatory Visit (INDEPENDENT_AMBULATORY_CARE_PROVIDER_SITE_OTHER): Payer: Medicare Other | Admitting: Allergy & Immunology

## 2019-07-18 VITALS — BP 118/80 | HR 75 | Temp 98.0°F | Resp 16 | Ht 63.0 in | Wt 157.0 lb

## 2019-07-18 DIAGNOSIS — J310 Chronic rhinitis: Secondary | ICD-10-CM | POA: Diagnosis not present

## 2019-07-18 DIAGNOSIS — T63441D Toxic effect of venom of bees, accidental (unintentional), subsequent encounter: Secondary | ICD-10-CM

## 2019-07-18 DIAGNOSIS — J4520 Mild intermittent asthma, uncomplicated: Secondary | ICD-10-CM | POA: Diagnosis not present

## 2019-07-18 MED ORDER — OLOPATADINE HCL 0.2 % OP SOLN: 1.0000 [drp] | Freq: Two times a day (BID) | OPHTHALMIC | 1 refills | Status: DC | PRN

## 2019-07-18 MED ORDER — MONTELUKAST SODIUM 10 MG PO TABS: 10.0000 mg | ORAL_TABLET | Freq: Every day | ORAL | 1 refills | Status: DC

## 2019-07-18 MED ORDER — LORATADINE 10 MG PO TABS: 10.0000 mg | ORAL_TABLET | Freq: Every day | ORAL | 1 refills | Status: DC

## 2019-07-18 MED ORDER — MOMETASONE FUROATE 50 MCG/ACT NA SUSP: NASAL | 1 refills | Status: DC

## 2019-07-18 MED ORDER — IPRATROPIUM BROMIDE 0.03 % NA SOLN: 1.0000 | Freq: Four times a day (QID) | NASAL | 1 refills | Status: DC

## 2019-07-18 MED ORDER — PANTOPRAZOLE SODIUM 40 MG PO TBEC: 40.0000 mg | DELAYED_RELEASE_TABLET | Freq: Every day | ORAL | 1 refills | Status: DC

## 2019-07-18 MED ORDER — EPINEPHRINE 0.3 MG/0.3ML IJ SOAJ: 0.3000 mg | INTRAMUSCULAR | 2 refills | Status: DC | PRN
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
## 2019-07-18 NOTE — Patient Instructions (Addendum)
1. Mild intermittent asthma - Continue montelukast 10mg  daily.   - Continue with ProAir 2 puffs every 4-6 hours as needed.  - Lung testing looks great today, so I do not think that we need to add on a daily inhaled medication.  - Call us if the new nose spray does not work.   2. Adverse food reactions (multiple foods) - Continue to avoid all of your foods.  - EpiPen refilled today.     3. Chronic rhinitis - Continue with nasal saline as needed.  - Add on ipratropium nasal spray one spray per nostril up to four times today (THIS WILL DRY OUT YOUR NOSE). - Continue with loratadine (Claritin) 10 mg twice daily.   - Add on Allegra (fexofeandine) 180 mg once daily as well.  - Continue with Nasonex one spray per nostril once daily.   4. 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    5. Return in about 3 months (around 10/18/2019). This can be an in-person, a virtual Webex or a telephone follow up visit.   Please inform us of any Emergency Department visits, hospitalizations, or changes in symptoms. Call us before going to the ED for breathing or allergy symptoms since we might be able to fit you in for a sick visit. Feel free to contact us anytime with any questions, problems, or concerns.  It was a pleasure to see you again today!   Websites that have reliable patient information: 1. American Academy of Asthma, Allergy, and Immunology: www.aaaai.org 2. Food Allergy Research and Education (FARE): foodallergy.org 3. Mothers of Asthmatics: http://www.asthmacommunitynetwork.org 4. American College of Allergy, Asthma, and Immunology: www.acaai.org  "Like" [place] on Facebook and Instagram for our latest updates!      Make sure you are registered to vote! If you have moved or changed any of your contact information, you will need to get this updated before voting!  In some cases, you MAY be able to register to vote online:  [email or web address]    Voter ID laws are NOT going into effect for the General Election in November 2020! DO NOT let this stop you from exercising your right to vote!   Absentee voting is the SAFEST way to vote during the coronavirus pandemic!   Download and print an absentee ballot request form at [email or web address] or you can scan the QR code below with your smart phone:      More information on absentee ballots can be found here: [email or web address]

## 2019-07-19 ENCOUNTER — Encounter: Payer: Self-pay | Admitting: Allergy & Immunology

## 2019-08-08 ENCOUNTER — Ambulatory Visit (INDEPENDENT_AMBULATORY_CARE_PROVIDER_SITE_OTHER): Payer: Medicare Other

## 2019-08-08 ENCOUNTER — Other Ambulatory Visit: Payer: Self-pay

## 2019-08-08 DIAGNOSIS — T63441D Toxic effect of venom of bees, accidental (unintentional), subsequent encounter: Secondary | ICD-10-CM | POA: Diagnosis not present

## 2019-08-16 ENCOUNTER — Other Ambulatory Visit: Payer: Self-pay | Admitting: Allergy & Immunology

## 2019-08-29 ENCOUNTER — Ambulatory Visit (INDEPENDENT_AMBULATORY_CARE_PROVIDER_SITE_OTHER): Payer: Medicare Other | Admitting: Podiatry

## 2019-08-29 ENCOUNTER — Other Ambulatory Visit: Payer: Self-pay

## 2019-08-29 ENCOUNTER — Encounter: Payer: Self-pay | Admitting: Podiatry

## 2019-08-29 DIAGNOSIS — M79676 Pain in unspecified toe(s): Secondary | ICD-10-CM | POA: Diagnosis not present

## 2019-08-29 DIAGNOSIS — B351 Tinea unguium: Secondary | ICD-10-CM

## 2019-08-29 DIAGNOSIS — M778 Other enthesopathies, not elsewhere classified: Secondary | ICD-10-CM | POA: Diagnosis not present

## 2019-08-31 ENCOUNTER — Other Ambulatory Visit: Payer: Self-pay | Admitting: Family Medicine
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
## 2019-09-05 ENCOUNTER — Ambulatory Visit: Payer: Medicare Other | Admitting: Podiatry

## 2019-09-05 ENCOUNTER — Ambulatory Visit: Payer: Self-pay

## 2019-09-10 ENCOUNTER — Other Ambulatory Visit: Payer: Self-pay | Admitting: Allergy & Immunology

## 2019-09-12 ENCOUNTER — Encounter: Payer: Self-pay | Admitting: Physician Assistant

## 2019-09-12 ENCOUNTER — Other Ambulatory Visit: Payer: Self-pay

## 2019-09-12 ENCOUNTER — Ambulatory Visit (INDEPENDENT_AMBULATORY_CARE_PROVIDER_SITE_OTHER): Payer: Medicare Other | Admitting: *Deleted

## 2019-09-12 DIAGNOSIS — Z96651 Presence of right artificial knee joint: Secondary | ICD-10-CM | POA: Diagnosis not present

## 2019-09-12 DIAGNOSIS — Z780 Asymptomatic menopausal state: Secondary | ICD-10-CM | POA: Diagnosis not present

## 2019-09-12 DIAGNOSIS — M47816 Spondylosis without myelopathy or radiculopathy, lumbar region: Secondary | ICD-10-CM | POA: Diagnosis not present

## 2019-09-12 DIAGNOSIS — M810 Age-related osteoporosis without current pathological fracture: Secondary | ICD-10-CM | POA: Diagnosis not present

## 2019-09-12 DIAGNOSIS — T63441D Toxic effect of venom of bees, accidental (unintentional), subsequent encounter: Secondary | ICD-10-CM

## 2019-09-12 DIAGNOSIS — Z8262 Family history of osteoporosis: Secondary | ICD-10-CM | POA: Diagnosis not present

## 2019-09-12 NOTE — Patient Outreach (Signed)
[facility] Evaluation Interviewer made contact with patient. Aging Gracefully survey completed.   Interviewer will send referral to OT as well Reginia Naas, RN for follow up.    [facility] Management Assistant

## 2019-09-18 ENCOUNTER — Other Ambulatory Visit: Payer: Self-pay | Admitting: Occupational Therapy

## 2019-09-18 ENCOUNTER — Other Ambulatory Visit: Payer: Self-pay

## 2019-09-18 NOTE — Patient Outreach (Signed)
Aging Gracefully Program  OT Initial Visit  09/18/2019  Riona Gelfand 1941-03-07 [id number]  Visit:  1- Initial Visit  Start Time:  0900 End Time:  [id number] Total Minutes:  89  CCAP: Typical Daily Routine: Typical Daily Routine:: Gets up at various hours depending on son-in-laws needs. Fixes meals, cleans around house, runs errands if needed.  What Types Of Care Problems Are You Having Throughout The Day?: Frequent falls and tripping, difficulty with steps and tub/shower mobility. Difficulty with caring for son-in-law.  What Kind Of Help Do You Receive?: None for herself. Daughter assists with ex-husbands needs as well. Has nursing aide Tues and Friday for son-in-law.  Do You Think You Need Other Types Of Help?: No physical assistance. Need assist with mobility through home repairs.  What Do You Think Would Make Everyday Life Easier For You?: Being able to move around the house safely, having more railings and grab bars What Is A Good Day Like?: No falls, getting all self-care and housework finished What Is A Bad Day Like?: Falling, son-in-law having bad day and needing more care Do You Have Time For Yourself?: Not much with needing to care for son-in-law  Patient Reported Equipment: Patient Reported Equipment Currently Used: Bedside Commode, [employer], [place], [place], Conservation officer, nature, Civil engineer, contracting, Golf, [employer] Aid, Radio broadcast assistant, Other (comment)(grab bars only in one shower) Other Equipment:: Research officer, political party Around the [employer]:   Functional Mobility-Walk A Block: Walk A Block: A Little Difficulty Do You:: No Device/No Assistance Importance Of Learning New Strategies:: Not At All Functional Mobility-Maintain Balance While Showering: Maintaining Balance While Showering: A Little Difficulty Do You:: No Device/No Assistance Importance Of Learning New Strategies:: Not At All Other Comments:: pt with small walk-in  shower, no room for shower seat  Functional Mobility-Stooping, Crouching, Kneeling To Retreive Item: Stooping, Crouching, or Kneeling To Retrieve Item: A Little Difficulty Do You:: Use A Device Importance Of Learning New Strategies:: Not At All Functional Mobility-Bending From Standing Position To Pick Up Clothing Off The Floor: Bending Over From Standing Position To Pick Up Clothing Off The Floor: A Little Difficulty Do You:: Use A Device Importance Of Learning New Strategies:: Not At All Functional Mobility-Reaching For Items Above Shoulder Level: Reaching For Items Above Shoulder Level: A Little Difficulty Do You:: Use A Device Importance Of Learning New Strategies:: Not At All Other Comments:: pt with hx of proximal humerus fracture and sx. RUE limited to 50% ROM Functional Mobility-Climb 1 Flight Of Stairs: Climb 1 Flight Of Stairs: A Lot Of Difficulty Do You:: No Device/No Assistance Importance Of Learning New Strategies:: Very Much Other Comments:: Hase 3 large steps into bedroom with max difficulty getting up /down Observation: Climb One Corporate investment banker Stairs: Supervision Safety: Extreme Risk Efficiency: Somewhat Intervention: Yes Functional Mobility-Move In And Out Of Chair: Move In and Out Of A Chair: A Little Difficulty Do You:: No Device/No Assistance Importance Of Learning New Strategies:: Not At All Functional Mobility-Move In And Out Of Bed: Walking Indoors/Getting Around The House: Moderate Difficulty Do You:: No Device/No Assistance Importance Of Learning New Strategies:: Moderate Observation: Move In and Out Of Bed: Supervision Safety: Moderate/Extreme Risk Efficiency: Somewhat Intervention: Yes Functional Mobility-Move In And Out Of Bath/Shower: Move In And Out Of A Bath/Shower: A Lot Of Difficulty(bathtub) Do You:: No Device/No Assistance Importance Of Learning New Strategies:: Very Much Other Comments:: Pt with garden tub, wide sides and one step to get  inside Observation: Move In And  Out Of Bath/Shower: N/O Safety: Moderate/Extreme Risk Efficiency: Not At All Intervention: Yes Functional Mobility-Get On And Off Toilet: Getting Up From The Floor: A Lot Of Difficulty Do You:: No Device/No Assistance Importance Of Learning New Strategies:: A Little Functional Mobility-Into And Out Of Car, Not Including Driving: Into  And Out Of Car, Not Including Driving: A Little Difficulty Do You:: Use Personal Assistance Importance Of Learning New Strategies:: Not At All Other Comments:: daughters assist if needed Functional Mobility-Other Mobility Difficulty:      Activities of Daily Living-Bathing/Showering: ADL-Bathing/Showering: No Difficulty Do You:: No Device/No Assistance Importance Of Learning New Strategies: Not At All Activities of Daily Living-Personal Hygiene and Grooming: Personal Hygiene and Grooming: No Difficulty Do You:: No Device/No Assistance Importance Of Learning New Strategies: Not At All Activities of Daily Living-Toilet Hygiene: Toilet Hygiene: No Difficulty Do You:: No Device/No Assistance Importance Of Learning New Strategies: Not At All Activities of Daily Living-Put On And Take Off Undergarments (Incl. Fasteners): Put On And Take Off Undergarments (Incl. Fasteners): No Difficulty Do You:: No Device/No Assistance Importance Of Learning New Strategies: Not At All Activities of Daily Living-Put On And Take Off Shirt/Dress/Coat (Incl. Fasteners): Put On And Take Off Shirt/Dress/Coat (Incl. Fasteners): No Difficulty Do You:: No Device/No Assistance Importance Of Learning New Strategies: Not At All Activities of Daily Living-Put On And Take Off Socks And Shoes: Put On And Take Off Socks And  Shoes: No Difficulty Do You:: No Device/No Assistance Importance Of Learning New Strategies: Not At All Activities of Daily Living-Feed Self: Feed Self: No Difficulty Do You:: No Device/No Assistance Importance Of Learning New  Strategies: Not At All Activities of Daily Living-Rest And Sleep: Rest and Sleep: A Little Difficulty Do You:: No Device/No Assistance Importance Of Learning New Strategies: Not At All Activities of Daily Living-Sexual Activity: Sexual  Activity: N/A Activities of Daily Living-Other Activity Identified:    Instrumental Activities of Daily Living-Light Homemaking (Laundry, Straightening Up, Vacuuming):  Do Light Homemaking (Laundry, Straightening Up, Vacuuming): Moderate Difficulty Do You:: Use Personal Assistance Importance Of Learning New Strategies: Not At All Instrumental Activities of Daily Living-Making A Bed: Making a Bed: A Little Difficulty Do You:: No Device/No Assistance Importance Of Learning New Strategies: A Little Instrumental Activities of Daily Living-Washing Dishes By Hand While Standing At The Sink: Washing Dishes By Hand While Standing At The Sink: Moderate Difficulty Do You:: No Device/No Assistance Importance Of Learning New Strategies: Not At All Other Comments:: fatigues with standing Instrumental Activities of Daily Living-Grocery Shopping: Do Grocery Shopping: Moderate Difficulty Do You:: Use Personal Assistance Importance Of Learning New Strategies: Not At All Instrumental Activities of Daily Living-Use Telephone: Use Telephone: No Difficulty Do You:: No Device/No Assistance Importance Of Learning New Strategies: Not At All Instrumental Activities of Daily Living-Financial Management: Financial Management: No Difficulty Do You:: No Device/No Assistance Importance Of Learning New Strategies: Not At All Instrumental Activities of Daily Living-Medications: Take Medications: A Little Difficulty Do You:: Use A Device Importance Of Learning New Strategies: A Little Instrumental Activities of Daily Living-Health Management And Maintenance: Health Management & Maintenance: No Difficulty Do You:: Use Personal Assistance Importance Of Learning New Strategies:  Not At All Instrumental Activities of Daily Living-Meal Preparation and Clean-Up: Meal Preparation and Clean-Up: A Little Difficulty Do You:: Use Personal Assistance Importance Of Learning New Strategies: Not At All Instrumental Activities of Daily Living-Provide Care For Others/Pets: Care For Others/Pets: No Difficulty Do You:: Use Personal Assistance Importance Of Learning New Strategies: Not At All  Instrumental Activities of Daily Living-Take Part In Organized Social Activities: Take Part In Organized Social Activities: A Little Difficulty Do You:: No Device/No Assistance Importance Of Learning New Strategies: Not At All Instrumental Activities of Daily Living-Leisure Participation: Leisure Participation: Moderate Difficulty Do You:: No Device/No Assistance Importance Of Learning New Strategies: A Little Instrumental Activities of Daily Living-Employment/Volunteer Activities: Employment/Volunteer Activities: N/A Instrumental Activities of Daily Living-Other Identifies:    Readiness To Change Score:  Readiness to Change Score: 6.33  Home Environment Assessment: Outside Home Entry:: Front: Has a ramp; 5 steps with railings; Back: 5 steps-not often used Bathroom:: Master bath: Pt has garden tub she wants to get in and out of, needs a railing to pull herself up. Small bathroom: tiles are broken and coming up, no subflooring. Master Bedroom:: Pt has 3 deep steps in half-circle position going into/out of bedroom-Needs railings. Pt having concerns about window. Other Home Environment Concerns:: Pt is concerned about floors sagging and needing work underneath house. Broken door in back that is boarded up. Lots of rugs-trip hazards  Durable Medical Equipment:    Patient Education: Education Provided: Yes Education Details: Educated on purpose of AG program. Provided Checklist for Safety and reviewed with pt. Discussed safety hazards and fall risks observed in the home. Person(s)  Educated: Patient Comprehension: Verbalized Understanding  Goals: Goals Addressed            This Visit's Progress   . Patient Stated       Improve ability to get into and out of bed safely and independently.     . Patient Stated       Improve ability to get into and out of bathtub safely and independently.     . Patient Stated       Improve ability to climb steps to and from bedroom safely.        Post Clinical Reasoning:  Pt will benefit from participation in the Aging Gracefully program to improve safety and independence in mobility and self-care tasks in the home. Pt is a high fall risk and experiences frequent falls at this time.     Guadelupe Sabin, OTR/L  [phone] 09/18/2019

## 2019-09-18 NOTE — Patient Instructions (Signed)
Goals    .         . Patient Stated     Improve ability to get into and out of bed safely and independently.     . Patient Stated     Improve ability to get into and out of bathtub safely and independently.     . Patient Stated     Improve ability to climb steps to and from bedroom safely.

## 2019-09-19 ENCOUNTER — Telehealth: Payer: Self-pay

## 2019-09-19 NOTE — Telephone Encounter (Signed)
DEXA 09/12/2019  T-score: -2.9 BMD: 0.521  Per Dr. Estanislado Pandy, schedule an appointment to discuss treatment options.  Patient is scheduled for 09/20/2019 for a virtual visit.

## 2019-09-19 NOTE — Progress Notes (Signed)
Virtual Visit via Telephone Note  I connected with Sherri Murray on 09/20/19 at  9:15 AM EST by telephone and verified that I am speaking with the correct person using two identifiers.  Location: Patient: Home Provider: Clinic  This service was conducted via virtual visit.  Both audio and visual tools were used.  The patient was located at home. I was located in my office.  Consent was obtained prior to the virtual visit and is aware of possible charges through their insurance for this visit.  The patient is an established patient.  Dr. Estanislado Pandy, MD conducted the virtual visit and Hazel Sams, PA-C acted as scribe during the service.  Office staff helped with scheduling follow up visits after the service was conducted.     I discussed the limitations, risks, security and privacy concerns of performing an evaluation and management service by telephone and the availability of in person appointments. I also discussed with the patient that there may be a patient responsible charge related to this service. The patient expressed understanding and agreed to proceed.  CC: Discuss DEXA results  History of Present Illness: Patient is a 78 year old female with a past medical history of osteoarthritis fibromyalgia and osteopenia.  She was recently diagnosed with osteoporosis on the basis of DEXA scan.  Patient states she continues to have some pain and stiffness in her joints from osteoarthritis and fibromyalgia.  She denies any joint swelling.  She has been taking calcium and vitamin D and doing some exercises.  She states she has not been taking any chronic steroid therapy.  She takes omeprazole.  Review of Systems  Constitutional: Positive for malaise/fatigue. Negative for fever.  Eyes: Negative for photophobia, pain, discharge and redness.  Respiratory: Negative for cough, shortness of breath and wheezing.   Cardiovascular: Negative for chest pain and palpitations.  Gastrointestinal:  Negative for blood in stool, constipation and diarrhea.  Genitourinary: Negative for dysuria.  Musculoskeletal: Positive for joint pain. Negative for back pain, myalgias and neck pain.  Skin: Negative for rash.  Neurological: Negative for dizziness and headaches.  Psychiatric/Behavioral: Negative for depression. The patient is not nervous/anxious and does not have insomnia.       Observations/Objective: Physical Exam  Constitutional: She is oriented to person, place, and time.  Neurological: She is alert and oriented to person, place, and time.  Psychiatric: Mood, memory, affect and judgment normal.    Patient reports morning stiffness for 5 minute.   Patient denies nocturnal pain.  Difficulty dressing/grooming: Denies Difficulty climbing stairs: Reports Difficulty getting out of chair: Reports Difficulty using hands for taps, buttons, cutlery, and/or writing: Reports  DXA 09/12/2019 Assessment and Plan: Visit Diagnoses: Primary osteoarthritis of both hands: She continues to have some pain and stiffness in her hands.  She states she has been followed by Dr. Sharol Given.   H/O total shoulder replacement, right - Dr. Lorin Mercy: She has limited abduction to about 90 degrees.    DDD (degenerative disc disease), lumbar:  She continues to have some lower back pain.  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 Lorin Mercy:   Fibromyalgia: She continues to have generalized pain from fibromyalgia.  Osteoporosis:  Detailed counseling guarding osteoporosis was provided.  Different treatment options and their side effects were discussed at length.  We had detailed discussion regarding the use of alendronate.  She was in agreement to proceed with the treatment.  We will obtain labs today.  Patient is aware to go for labs  today at the [facility] lab.  If the labs are within normal limits will call in a prescription for alendronate 70 mg p.o. weekly.  She had similar advised to take alendronate once a week  with full glass of water on an empty stomach.  She has been advised to stay upright for 30 minutes.  Patient states she does not need any dental work-up currently.  Risk of osteonecrosis of the jaw, which is minimal was also discussed. She is on calcium/vitamin D 600mg -400IU once daily.   DEXA 09/12/19: 1/3 left distal radius BMD 0.521, T-score -2.9. Previous DEXA 10/02/14: T-score -1.6 L femur neck.  We will repeat bone density in 2 years.  Regular use of calcium, vitamin D and resistive exercises were discussed.  Gait instability: She has been having frequent falls.  Declined PT  Other medical conditions are listed as follows:   Gastroesophageal reflux disease, esophagitis presence not specified  Prediabetes  Benign essential HTN  Diastolic dysfunction  Follow Up Instructions: She will follow up in 3 months as a schedule.   I discussed the assessment and treatment plan with the patient. The patient was provided an opportunity to ask questions and all were answered. The patient agreed with the plan and demonstrated an understanding of the instructions.   The patient was advised to call back or seek an in-person evaluation if the symptoms worsen or if the condition fails to improve as anticipated.  I provided 25  minutes of non-face-to-face time during this encounter.   Bo Merino, MD

## 2019-09-20 ENCOUNTER — Other Ambulatory Visit: Payer: Self-pay

## 2019-09-20 ENCOUNTER — Telehealth: Payer: Self-pay | Admitting: Rheumatology

## 2019-09-20 ENCOUNTER — Telehealth (INDEPENDENT_AMBULATORY_CARE_PROVIDER_SITE_OTHER): Payer: Medicare Other | Admitting: Rheumatology

## 2019-09-20 DIAGNOSIS — Z96611 Presence of right artificial shoulder joint: Secondary | ICD-10-CM

## 2019-09-20 DIAGNOSIS — M810 Age-related osteoporosis without current pathological fracture: Secondary | ICD-10-CM | POA: Diagnosis not present

## 2019-09-20 DIAGNOSIS — R7303 Prediabetes: Secondary | ICD-10-CM

## 2019-09-20 DIAGNOSIS — I5189 Other ill-defined heart diseases: Secondary | ICD-10-CM

## 2019-09-20 DIAGNOSIS — M797 Fibromyalgia: Secondary | ICD-10-CM | POA: Diagnosis not present

## 2019-09-20 DIAGNOSIS — R2681 Unsteadiness on feet: Secondary | ICD-10-CM

## 2019-09-20 DIAGNOSIS — I10 Essential (primary) hypertension: Secondary | ICD-10-CM

## 2019-09-20 DIAGNOSIS — M19041 Primary osteoarthritis, right hand: Secondary | ICD-10-CM

## 2019-09-20 DIAGNOSIS — M19042 Primary osteoarthritis, left hand: Secondary | ICD-10-CM | POA: Diagnosis not present

## 2019-09-20 DIAGNOSIS — Z96651 Presence of right artificial knee joint: Secondary | ICD-10-CM

## 2019-09-20 DIAGNOSIS — M5136 Other intervertebral disc degeneration, lumbar region: Secondary | ICD-10-CM | POA: Diagnosis not present

## 2019-09-20 NOTE — Telephone Encounter (Signed)
Lab orders released for [facility].  

## 2019-09-20 NOTE — Telephone Encounter (Signed)
Rann with [facility] requesting lab orders to be released on patient. Patient there now.

## 2019-09-21 ENCOUNTER — Other Ambulatory Visit: Payer: Self-pay

## 2019-09-21 LAB — COMPLETE METABOLIC PANEL WITH GFR
AG Ratio: 1.7 (calc) (ref 1.0–2.5)
ALT: 20 U/L (ref 6–29)
AST: 27 U/L (ref 10–35)
Albumin: 4.1 g/dL (ref 3.6–5.1)
Alkaline phosphatase (APISO): 61 U/L (ref 37–153)
BUN: 11 mg/dL (ref 7–25)
CO2: 24 mmol/L (ref 20–32)
Calcium: 9.3 mg/dL (ref 8.6–10.4)
Chloride: 106 mmol/L (ref 98–110)
Creat: 0.82 mg/dL (ref 0.60–0.93)
GFR, Est African American: 79 mL/min/{1.73_m2} (ref >=60)
GFR, Est Non African American: 68 mL/min/{1.73_m2} (ref >=60)
Globulin: 2.4 g/dL (calc) (ref 1.9–3.7)
Glucose, Bld: 100 mg/dL — ABNORMAL HIGH (ref 65–99)
Potassium: 4 mmol/L (ref 3.5–5.3)
Sodium: 139 mmol/L (ref 135–146)
Total Bilirubin: 0.3 mg/dL (ref 0.2–1.2)
Total Protein: 6.5 g/dL (ref 6.1–8.1)

## 2019-09-21 LAB — CBC WITH DIFFERENTIAL/PLATELET
Absolute Monocytes: 524 cells/uL (ref 200–950)
Basophils Absolute: 23 cells/uL (ref 0–200)
Basophils Relative: 0.4 %
Eosinophils Absolute: 143 cells/uL (ref 15–500)
Eosinophils Relative: 2.5 %
HCT: 34.9 % — ABNORMAL LOW (ref 35.0–45.0)
Hemoglobin: 11.7 g/dL (ref 11.7–15.5)
Lymphs Abs: 2599 cells/uL (ref 850–3900)
MCH: 31.1 pg (ref 27.0–33.0)
MCHC: 33.5 g/dL (ref 32.0–36.0)
MCV: 92.8 fL (ref 80.0–100.0)
MPV: 10.9 fL (ref 7.5–12.5)
Monocytes Relative: 9.2 %
Neutro Abs: 2411 cells/uL (ref 1500–7800)
Neutrophils Relative %: 42.3 %
Platelets: 203 10*3/uL (ref 140–400)
RBC: 3.76 10*6/uL — ABNORMAL LOW (ref 3.80–5.10)
RDW: 12.1 % (ref 11.0–15.0)
Total Lymphocyte: 45.6 %
WBC: 5.7 10*3/uL (ref 3.8–10.8)

## 2019-09-21 LAB — PARATHYROID HORMONE, INTACT (NO CA): PTH: 35 pg/mL (ref 14–64)

## 2019-09-21 LAB — VITAMIN D 25 HYDROXY (VIT D DEFICIENCY, FRACTURES): Vit D, 25-Hydroxy: 26 ng/mL — ABNORMAL LOW (ref 30–100)

## 2019-09-21 MED ORDER — ALENDRONATE SODIUM 70 MG PO TABS: 70.0000 mg | ORAL_TABLET | ORAL | 2 refills | Status: DC

## 2019-09-21 NOTE — Progress Notes (Signed)
Please call in Fosamax 70 mg p.o. weekly.  30-day supply with 2 refills.  If patient tolerates the medication then we can do 90-day supply in the future.  Please notify patient.

## 2019-09-21 NOTE — Progress Notes (Signed)
Vitamin D is low.  Rest the labs are stable.  Please advise patient to take vitamin D 2000 units daily.

## 2019-09-22 ENCOUNTER — Other Ambulatory Visit: Payer: Self-pay | Admitting: *Deleted

## 2019-09-22 NOTE — Patient Outreach (Signed)
Aging Gracefully Program  09/22/2019  Sherri Murray June 06, 1941 [id number]   Placed call to [facility] "Sherri Murray" Doug Sou to schedule initial Aging Gracefully RN home visit; HIPAA/ identity verified.     Visit scheduled with Sherri Murray for Tuesday 09/26/2019 at 2:00 pm; verified patient's address and explained that I would contact her by phone on morning of scheduled visit prior to arriving at her home, to complete coronavirus questionnaire screening tool; confirmed that patient has a mask available at her home for use during scheduled visit.   Provided my direct contact information to patient, should she wish to contact me prior to scheduled home visit.   Plan:   Scheduled Aging Gracefully RN home visit #1 on Tuesday September 26, 2019  Oneta Rack, RN, BSN, [employer] Coordinator [facility] Care Management  [phone]

## 2019-09-26 ENCOUNTER — Other Ambulatory Visit: Payer: Self-pay | Admitting: *Deleted

## 2019-09-26 ENCOUNTER — Other Ambulatory Visit: Payer: Self-pay

## 2019-09-27 ENCOUNTER — Encounter: Payer: Self-pay | Admitting: *Deleted

## 2019-09-27 NOTE — Patient Outreach (Signed)
Aging Gracefully Program  RN Visit  09/27/2019  Sherri Murray 04/08/1941 [id number]  Visit:   RN Visit # 44  Aging Gracefully RN Visit # 1: [id number] screening completed by telephone prior to visit at patient's home; during in-home visit, patient in mask and RN with full PPE in place: mask, face shield, gown, gloves, shoe covers; patient temperature at RN arrival: 97.7 social distancing maintained > 6 feet throughout entirety of home visit  Start Time: 14:15   End Time:   15:50 Total Minutes: 90 minutes    Readiness To Change Score:  Readiness to Change Score: 7  Universal RN Interventions: Calendar Distribution: Yes- provided and reviewed with patient Exercise Review: Yes- hard printed copy of Aging Gracefully exercises provided and reviewed during visit Medications: Yes - reviewed Medication Changes: No(reccommend patient discuss OTC med use for pain with PCP) Mood: Yes Pain: Yes PCP Advocacy/Support: Yes Fall Prevention: Yes Incontinence: Yes  Healthcare Provider Communication: Did Higher education careers adviser With Client's Healthcare Provider?: No  Clinician View of Client Situation: Clinician View Of Client Situation: Sherri Murray has multiple issues that are challenging for her including pain management for chronic pain, incontinence, and falls; she has recently had family members now living with her and is caring for her son in law at home.  Her home environment is clean but there are multiple fall hazards in her home and the enviroment is disorganized due to addition of family members now residing with her.  Patient is not consistently organized with her medications.  She does not routinely participate in exercise program, but would like to start doing so.  Patient is independent in ADL's and iADL's and continues to drive. She does not cuurently use assistive devices for fall prevention, and would like to avoid having to use in the future.  Her gait with ambulation during visit is steady and  purposeful.   Client's View of His/Her Situation: Client View Of His/Her Situation: "I am doing all I can to deal with all of these recent changes at my home; I know there is a lot I need to accomplish, and I try to stay on top of everything.  I want to have this work completed on my home so I can live here and not worry about the house falling apart."  Medication Assessment: Do You Have Any Problems Paying For Medications?: No Where Does Client Store Medications?: Other:(has a basket for all meds; uses pill box for prescription medications, "most of the time" and takes OTC meds out of basket directly fro bottle when she takes) Can Client Read Pill Bottles?: Yes(with use of reading glasses) Does Client Use A Pillbox?: Yes(reports inconsistent use of pill box) Does Anyone Assist Client In Filling Pillbox?: Yes("sometimes my daughter") Does Anyone Assist Client In Taking Medications?: No Do You Take Vitamin D?: Yes Total Number Of Medications That The Client Takes: ("It depends on the day and how many OTC meds I have to take for my pain") Does Client Have Any Questions Or Concerns About Medictions?: No Is Client Complaining Of Any Symptoms That Could Be Side Effects To Medications?: No Any Possible Changes In Medication Regimen?: Yes(Encouraged patient to discuss OTC pain medications with PCP to determine best one to use for her pain; patient has a lot of various OTC medications and we discussed the benefits of trying one medication to see if it is effective before adding others)  OT Update: Will update OT Guadelupe Sabin on today's RN Visit # 1  Session Summary:  Successful RN Visit # 1; patient acknowledges that she has multiple challenging areas she could work to improve, and she would like to focus on fall prevention; we discussed strategies for pain management and incontinence as a means to prevent falls as well.  PLAN:  Will mail AVS with medication list Will plan 2nd home visit within 4  weeks- will call patient to schedule Provided my contact information and business card for patient to use if needed Will update Aging Gracefully OT and handyman team on successful RN visit # 1 today  Oneta Rack, RN, BSN, [place] Coordinator [facility] Care Management  [phone]

## 2019-09-27 NOTE — Patient Instructions (Signed)
CLIENT/RN ACTION PLAN - FALL PREVENTION  Registered Nurse:  Reginia Naas RN Date: September 26, 2019  Client Name:Sherri Murray Client ID:    Target Area:  FALL PREVENTION    Why Problem May Occur:  Disorganization in home with recent family changes  Disorganization with medications for pain Chronic generalized pain Previous Falls Urine/ bowel Incontinence   Target Goal:   STRATEGIES Coping Strategies Ideas  . Change Positions Slowly: lying to sitting, sitting to standing . Changing positions can make people lightheaded. . When getting up in the morning sit for a few minutes, before standing.  . Stand for a few minutes before walking and hold on to sturdy furniture or countertop.  . Don't rush  . Visualize completion of activities in your mind before you begin them  Drink water . Dehydration can make people dizzy. . Ask your healthcare provider how much water you can drink. . Decrease caffeine, caffeine makes you urinate a lot, which can make you dehydrated . Although you enjoy coffee, you may want to consider trying "half-caff" coffee products to help with urinary incontinence  If you fall, tell someone . Tell a friend or family member even if you didn't get hurt. . Tell your healthcare provider if you fall.  They can help you figure out why. . Use your health calendar to write down any falls your experience and why you fell  Get your vision and hearing checked . Poor vision and hearing loss can make people fall. . Glaucoma and diabetes can cause poor vision. . Make annual appointment for eye exam . Consider making appointment for hearing exam  Other .    Prevention Ideas  Review your Medicines . Many medicines can make you dizzy or sleepy and increase your risk of falling. . Your Aging Gracefully Nurse will look at your medications and see if you are taking any that might cause falling. . Discuss your use of over-the counter (OTC) medications for pain relief with your PCP; ask  her which pain medication is best for the type of pain you have . Start by trying one OTC medication before you take others, so you know what helps and what does not help . Consider asking your pharmacy about blister-packaging for your regularly prescribed medications so they are more organized  Activity and Exercise . Start performing Aging Gracefully exercises that we reviewed during our visit . Walking (inside or outside)- always wear shoes with good soles that are not slick on the bottom . Dancing . Gardening . Housework:  cooking, Education administrator, Medical sales representative . Exercise while watching TV . Swimming or water aerobics. . Plan for changes in weather ahead of time and dress accordingly . Start new exercise programs by beginning conservatively and gradually increasing over time  Strengthen Bones . Exercise makes your bones stronger. . Vitamin D and Calcium make your bones stronger.  Ask your Healthcare provider if you should be taking them. . Your body makes vitamin D from the sun.  Sit in the sun for 10 minutes every day (without sunscreen). .   Control your urine  . Prevent constipation . Cut back on caffeinated drinks. . Don't wait to urinate, but do not rush to get to bathroom; try to go to bathroom before you feel urgency. . If diabetic, control your blood sugar. . Ask your Aging Gracefully Nurse and Healthcare Provider  about urine control.  Tell your PCP that you are not sure if your medication for urine incontinence is working .  Control your blood sugar (if you are diabetic) . High blood sugar can cause frequent urination, poor vision, and numbness in your feet, which can make you fall.   . Low blood sugar can cause confusion and dizziness. .   Other coping strategies 1. Continue taking warm showers to help with chronic pain 2.  Consider "backing truck in" your driveway, so you can pull straight out of the drive and do not strain your neck and cause more pain, which increases your fall  risk 3. 4. 5.   PRACTICE It is important to practice the strategies so we can determine if they will be effective in helping to reach the goal.    Follow these specific recommendations:  -- Don't rush  -- Visualize completion of activities in your mind before you begin them       -- Although you enjoy coffee, you may want to consider trying "half-caff" coffee     products to help with urinary incontinence       -- Use your health calendar to write down any falls your experience and why you fell -- Make annual appointment for eye exam -- Consider making appointment for hearing exam -- Discuss your use of over-the counter (OTC) medications for pain relief with your PCP; ask her which pain medication is best for the type of pain you have -- Start by trying one OTC medication before you take others, so you know what helps and what does not help -- Consider asking your pharmacy about blister-packaging for your regularly prescribed medications so they are more organized -- Start performing Aging Gracefully exercises that we reviewed during our visit -- Walking (inside or outside)- always wear shoes with good soles that are not slick on the bottom -- Plan for changes in weather ahead of time and dress accordingly -- Start new exercise programs by beginning conservatively and gradually increasing over time -- Don't wait to urinate, but do not rush to get to bathroom; try to go to bathroom before you feel urgency -- Tell your PCP that you are not sure if your medication for urine incontinence is working -- Continue taking warm showers to help with chronic pain -- Consider "backing truck in" your driveway, so you can pull straight out of the drive and do not strain your neck and cause more pain, which increases your fall risk     If strategy does not work the first time, try it again.     We may make some changes over the next few sessions.      Oneta Rack, RN, BSN, [employer] [facility] Care Management  [phone]

## 2019-09-29 ENCOUNTER — Other Ambulatory Visit: Payer: Self-pay | Admitting: Podiatry

## 2019-10-02 ENCOUNTER — Other Ambulatory Visit: Payer: Self-pay | Admitting: Family Medicine

## 2019-10-02 NOTE — Telephone Encounter (Signed)
Last office visit 11/15/2018 for CPE.  Last refilled 07/03/2019 for #360 with no refills.  CPE scheduled for 11/21/2019.

## 2019-10-03 ENCOUNTER — Ambulatory Visit: Payer: Self-pay

## 2019-10-10 ENCOUNTER — Ambulatory Visit: Payer: Self-pay

## 2019-10-16 ENCOUNTER — Other Ambulatory Visit: Payer: Self-pay

## 2019-10-16 ENCOUNTER — Other Ambulatory Visit: Payer: Self-pay | Admitting: Occupational Therapy

## 2019-10-16 NOTE — Patient Outreach (Signed)
Aging Gracefully Program  OT Follow-Up Visit  10/16/2019  Kaelah Stumpf 05-13-1941 [id number]  Visit:  2- Second Visit  Start Time:  1000 End Time:  [id number] Total Minutes:  50   Readiness to Change Score :  Readiness to Change Score: 7   Durable Medical Equipment: Adaptive Equipment: Bed Rail, [employer] Adaptive Equipment Distribution Date: 10/16/19  Patient Education: Education Provided: Yes Education Details: Provided handout for how to get up from a fall and reviewed with pt. Also educated on proper use of provided AE. Person(s) Educated: Patient Comprehension: Verbalized Understanding, Returned Demonstration  Goals:  Goals Addressed            This Visit's Progress   . COMPLETED: Patient Stated       Improve ability to get into and out of bed safely and independently.   ACTION PLANNING - CUSTOM  Target Problem Area: Getting into and out of the bed safely   Why Problem May Occur: -bed is very high -no rails available -using a small step stool with risk of turning over and falling     Target Goal: To be able to get into and out of the bed safely.    STRATEGIES Saving Your Energy: DO: DON'T:  -take your time -be in a rush               Modifying your home environment and making it safe: DO: DON'T:  -Use an appropriate stool (provided) -use a stepstool  -Use a bed rail (provided)  -attempt to jump on the bed            Simplifying the way you set up tasks or daily routines: DO: DON'T:            Practice It is important to practice the strategies so we can determine if they will be effective in helping to reach your goal. Follow these specific recommendations: 1. Use the provided stool 2.  Use the provided bed rail 3.  Take your time & position yourself appropriately before climbing onto the bed.   If a strategy does not work the first time, try it again and again (and maybe again). We may make some changes over the next few  sessions, based on how they work.           Post Clinical Reasoning: Client Action (Goal) One Interventions: Improve ability to get into and out of the bed. Did Client Try?: Yes Targeted Problem Area Status: A Lot Better Clinician View Of Client Situation:: AE provided greatly improved pt safety and success with bed mobility tasks. Client View Of His/Her Situation:: Pt was very pleased with improvement in abiltity to use AE for getting into and out of the bed. Next Visit Plan:: Follow up on AE use for bed mobility and work on getting into/out of the bedroom safely-provided railing has been installed.   Guadelupe Sabin, OTR/L  [phone]

## 2019-10-16 NOTE — Patient Instructions (Signed)
Improve ability to get into and out of bed safely and independently.   ACTION PLANNING - CUSTOM  Target Problem Area: Getting into and out of the bed safely   Why Problem May Occur: -bed is very high -no rails available -using a small step stool with risk of turning over and falling     Target Goal: To be able to get into and out of the bed safely.    STRATEGIES Saving Your Energy: DO: DON'T:  -take your time -be in a rush               Modifying your home environment and making it safe: DO: DON'T:  -Use an appropriate stool (provided) -use a stepstool  -Use a bed rail (provided)  -attempt to jump on the bed            Simplifying the way you set up tasks or daily routines: DO: DON'T:            Practice It is important to practice the strategies so we can determine if they will be effective in helping to reach your goal. Follow these specific recommendations: 1. Use the provided stool 2.  Use the provided bed rail 3.  Take your time & position yourself appropriately before climbing onto the bed.   If a strategy does not work the first time, try it again and again (and maybe again). We may make some changes over the next few sessions, based on how they work.

## 2019-10-20 ENCOUNTER — Other Ambulatory Visit: Payer: Self-pay | Admitting: *Deleted

## 2019-10-20 NOTE — Patient Outreach (Signed)
Aging Gracefully Program  RN Visit  10/20/2019  Sherri Murray 21-Sep-1941 [id number]  Visit:   RN Visit # 2- completed by phone  Start Time:   11:05 am End Time:   11:40 am Total Minutes:   35 minutes  Readiness To Change Score:     Universal RN Interventions: Calendar Distribution: Yes(reviewed use of provided calendar from RN visit 1) Exercise Review: Yes(Reviewed AG exercises as demontrated on RN Visit 1) Medications: Yes(Reviewed from RN Visit 1) Pain: Yes(Reviewed from RN visit 1) Fall Prevention: Yes(Reviewed from RN Visit 1) Incontinence: Yes(Reviewed from [employer] Visit 1)  Healthcare Provider Communication: Did Higher education careers adviser With [employer] Provider?: No  Public affairs consultant of Client Situation: Public affairs consultant Of Client Situation: Sherri Murray is making gradual progress in meeting her goals due to stated time constraints with the recent holiday season; she reports that she is using the AE provided by OT, which are helping.  She has started making minor changes as discussed during RN Visit 1 and hopes to incorporate more changes now that the holidays are over.  We reviewed strategies from RN Visit 1 and completed brainstorming to add a few more things patient might consider in meeting her goals.   Client's View of His/Her Situation: Client View Of His/Her Situation: "The holidays threw all of [place] off a little bit; but I have just started doing the exerises and hope to do them more now that the holidays are over; I have not yet talked to my outpatient pharmacy about setting me up with blister packs, but I plan to do so soon.  I have not started using my calendar yet, but I plan to when things at home calm down.  I have started backing my truck in which has helped, and I have started doing the [place] exercises, which are helping a little bit.  I did have another fall on Sunday 10/15/2019 when taking down the Christmas tree, but I did not injure myself.  I will talk to my doctor about my  medicines and tell them about my falls when I see her in February.  I still find myself rushing to do things aorund the house and am trying to learn to take my time with activities."    OT Update: Will update Aging Gracefully team on successful completion of RN Visit # 2 today  Session Summary: Today, Sherri Murray and I reviewed all of the strategies around fall prevention we had previously discussed during RN Visit 1; we brainstormed together and Sherri Murray was able to identify several strategies that she might try in addition to the ones we discussed during RN Visit # 1; patient was encouraged to continue trying established/ new strategies and her goal strategies were updated and AVS was placed in mail to patient  CLIENT/RN [place]  Registered Nurse:  Reginia Naas RN Date: September 26, 2019  Client Name:Sherri Murray Client ID:    Target Area:  FALL PREVENTION    Why Problem May Occur:  Disorganization in home with recent family changes  Disorganization with medications for pain Chronic generalized pain Previous Falls Urine/ bowel Incontinence   Target Goal:   STRATEGIES Coping Strategies Ideas  . Change Positions Slowly: lying to sitting, sitting to standing . Changing positions can make people lightheaded. . When getting up in the morning sit for a few minutes, before standing.  . Stand for a few minutes before walking and hold on to sturdy furniture or countertop.  . Don't rush  .  Visualize completion of activities in your mind before you begin them  Drink water . Dehydration can make people dizzy. . Ask your healthcare provider how much water you can drink. . Decrease caffeine, caffeine makes you urinate a lot, which can make you dehydrated . Although you enjoy coffee, you may want to consider trying "half-caff" coffee products to help with urinary incontinence  If you fall, tell someone . Tell a friend or family member even if you didn't get hurt. . Tell your  healthcare provider if you fall.  They can help you figure out why. . Use your health calendar to write down any falls your experience and why you fell  Get your vision and hearing checked . Poor vision and hearing loss can make people fall. . Glaucoma and diabetes can cause poor vision. . Make annual appointment for eye exam . Consider making appointment for hearing exam  Other .    Prevention Ideas  Review your Medicines . Many medicines can make you dizzy or sleepy and increase your risk of falling. . Your Aging Gracefully Nurse will look at your medications and see if you are taking any that might cause falling. . Discuss your use of over-the counter (OTC) medications for pain relief with your PCP; ask her which pain medication is best for the type of pain you have . Start by trying one OTC medication before you take others, so you know what helps and what does not help . Consider asking your pharmacy about blister-packaging for your regularly prescribed medications so they are more organized  Activity and Exercise . Start performing Aging Gracefully exercises that we reviewed during our visit . Walking (inside or outside)- always wear shoes with good soles that are not slick on the bottom . Dancing . Gardening . Housework:  cooking, Education administrator, Medical sales representative . Exercise while watching TV . Swimming or water aerobics. . Plan for changes in weather ahead of time and dress accordingly . Start new exercise programs by beginning conservatively and gradually increasing over time  Strengthen Bones . Exercise makes your bones stronger. . Vitamin D and Calcium make your bones stronger.  Ask your Healthcare provider if you should be taking them. . Your body makes vitamin D from the sun.  Sit in the sun for 10 minutes every day (without sunscreen). .   Control your urine  . Prevent constipation . Cut back on caffeinated drinks. . Don't wait to urinate, but do not rush to get to bathroom; try to go to  bathroom before you feel urgency. . If diabetic, control your blood sugar. . Ask your Aging Gracefully Nurse and Healthcare Provider  about urine control.  Tell your PCP that you are not sure if your medication for urine incontinence is working .   Control your blood sugar (if you are diabetic) . High blood sugar can cause frequent urination, poor vision, and numbness in your feet, which can make you fall.   . Low blood sugar can cause confusion and dizziness. .   Other coping strategies 1. Continue taking warm showers to help with chronic pain 2.  Consider "backing truck in" your driveway, so you can pull straight out of the drive and do not strain your neck and cause more pain, which increases your fall risk 3. 4. 5.   PRACTICE It is important to practice the strategies so we can determine if they will be effective in helping to reach the goal.    Follow these specific recommendations:  --  Don't rush  -- Visualize completion of activities in your mind before you begin them       -- Although you enjoy coffee, you may want to consider trying "half-caff" coffee     products to help with urinary incontinence       -- Use your health calendar to write down any falls your experience and why you fell -- Make annual appointment for eye exam -- Consider making appointment for hearing exam -- Discuss your use of over-the counter (OTC) medications for pain relief with your PCP; ask her which pain medication is best for the type of pain you have -- Start by trying one OTC medication before you take others, so you know what helps and what does not help -- Consider asking your pharmacy about blister-packaging for your regularly prescribed medications so they are more organized -- Continue performing Aging Gracefully exercises that we reviewed during our first visit-- gradually increase the time you perform exercises each week -- Walking (inside or outside)- always wear shoes with good soles that are  not slick on the bottom -- Plan for changes in weather ahead of time and dress accordingly -- Start new exercise programs by beginning conservatively and gradually increasing over time -- Don't wait to urinate, but do not rush to get to bathroom; try to go to bathroom before you feel urgency -- Tell your PCP that you are not sure if your medication for urine incontinence is working -- Continue taking warm showers to help with chronic pain -- Consider "backing truck in" your driveway, so you can pull straight out of the drive and do not strain your neck and cause more pain, which increases your fall risk : Good job at starting this practice- I'm glad to hear it is helping! -- Consider setting your alarm 5 minutes early each day so you are able to stretch before getting out of bed and do not have to rush when getting out of bed -- Consider talking to your doctor about initiating new Physical Therapy program -- Keep using the [place] that have been provided/ reviewed by the Occupational Therapist:  I'm glad to hear these are helping! -- Continue efforts to modify your home furnishing so that you have clear pathways      If strategy does not work the first time, try it again.     We may make some changes over the next few sessions.      I look forward to hearing about your progress when I call you on Thursday February 4, around 10:00 am  Oneta Rack, [place], BSN, [place] Care Management  [phone]

## 2019-10-20 NOTE — Patient Instructions (Signed)
It was nice talking to you today!  Please see the updated strategies below, based on our conversation today  I look forward to talking to you again to get an update on your progress on November 16, 2019, around 10:00 am    CLIENT/RN [place]  Registered Nurse:  Reginia Naas RN Date: September 26, 2019  Client Name:Sherri Murray Client ID:    Target Area:  FALL PREVENTION    Why Problem May Occur:  Disorganization in home with recent family changes  Disorganization with medications for pain Chronic generalized pain Previous Falls Urine/ bowel Incontinence   Target Goal:   STRATEGIES Coping Strategies Ideas  . Change Positions Slowly: lying to sitting, sitting to standing . Changing positions can make people lightheaded. . When getting up in the morning sit for a few minutes, before standing.  . Stand for a few minutes before walking and hold on to sturdy furniture or countertop.  . Don't rush  . Visualize completion of activities in your mind before you begin them  Drink water . Dehydration can make people dizzy. . Ask your healthcare provider how much water you can drink. . Decrease caffeine, caffeine makes you urinate a lot, which can make you dehydrated . Although you enjoy coffee, you may want to consider trying "half-caff" coffee products to help with urinary incontinence  If you fall, tell someone . Tell a friend or family member even if you didn't get hurt. . Tell your healthcare provider if you fall.  They can help you figure out why. . Use your health calendar to write down any falls your experience and why you fell  Get your vision and hearing checked . Poor vision and hearing loss can make people fall. . Glaucoma and diabetes can cause poor vision. . Make annual appointment for eye exam . Consider making appointment for hearing exam  Other .    Prevention Ideas  Review your Medicines . Many medicines can make you dizzy or sleepy and increase  your risk of falling. . Your Aging Gracefully Nurse will look at your medications and see if you are taking any that might cause falling. . Discuss your use of over-the counter (OTC) medications for pain relief with your PCP; ask her which pain medication is best for the type of pain you have . Start by trying one OTC medication before you take others, so you know what helps and what does not help . Consider asking your pharmacy about blister-packaging for your regularly prescribed medications so they are more organized  Activity and Exercise . Start performing Aging Gracefully exercises that we reviewed during our visit . Walking (inside or outside)- always wear shoes with good soles that are not slick on the bottom . Dancing . Gardening . Housework:  cooking, Education administrator, Medical sales representative . Exercise while watching TV . Swimming or water aerobics. . Plan for changes in weather ahead of time and dress accordingly . Start new exercise programs by beginning conservatively and gradually increasing over time  Strengthen Bones . Exercise makes your bones stronger. . Vitamin D and Calcium make your bones stronger.  Ask your Healthcare provider if you should be taking them. . Your body makes vitamin D from the sun.  Sit in the sun for 10 minutes every day (without sunscreen). .   Control your urine  . Prevent constipation . Cut back on caffeinated drinks. . Don't wait to urinate, but do not rush to get to bathroom; try to  go to bathroom before you feel urgency. . If diabetic, control your blood sugar. . Ask your Aging Gracefully Nurse and Healthcare Provider  about urine control.  Tell your PCP that you are not sure if your medication for urine incontinence is working .   Control your blood sugar (if you are diabetic) . High blood sugar can cause frequent urination, poor vision, and numbness in your feet, which can make you fall.   . Low blood sugar can cause confusion and dizziness. .   Other coping  strategies 1. Continue taking warm showers to help with chronic pain 2.  Consider "backing truck in" your driveway, so you can pull straight out of the drive and do not strain your neck and cause more pain, which increases your fall risk 3. 4. 5.   PRACTICE It is important to practice the strategies so we can determine if they will be effective in helping to reach the goal.    Follow these specific recommendations:  -- Don't rush  -- Visualize completion of activities in your mind before you begin them       -- Although you enjoy coffee, you may want to consider trying "half-caff" coffee     products to help with urinary incontinence       -- Use your health calendar to write down any falls your experience and why you fell -- Make annual appointment for eye exam -- Consider making appointment for hearing exam -- Discuss your use of over-the counter (OTC) medications for pain relief with your PCP; ask her which pain medication is best for the type of pain you have -- Start by trying one OTC medication before you take others, so you know what helps and what does not help -- Consider asking your pharmacy about blister-packaging for your regularly prescribed medications so they are more organized -- Continue performing Aging Gracefully exercises that we reviewed during our first visit-- gradually increase the time you perform exercises each week -- Walking (inside or outside)- always wear shoes with good soles that are not slick on the bottom -- Plan for changes in weather ahead of time and dress accordingly -- Start new exercise programs by beginning conservatively and gradually increasing over time -- Don't wait to urinate, but do not rush to get to bathroom; try to go to bathroom before you feel urgency -- Tell your PCP that you are not sure if your medication for urine incontinence is working -- Continue taking warm showers to help with chronic pain -- Consider "backing truck in" your  driveway, so you can pull straight out of the drive and do not strain your neck and cause more pain, which increases your fall risk : Good job at starting this practice- I'm glad to hear it is helping! -- Consider setting your alarm 5 minutes early each day so you are able to stretch before getting out of bed and do not have to rush when getting out of bed -- Consider talking to your doctor about initiating new Physical Therapy program -- Keep using the Assistive Equipment that have been provided/ reviewed by the Occupational Therapist:  I'm glad to hear these are helping! -- Continue efforts to modify your home furnishing so that you have clear pathways      If strategy does not work the first time, try it again.     We may make some changes over the next few sessions.      I look forward to hearing  about your progress when I call you on Thursday February 4, around 10:00 am  Oneta Rack, [place], BSN, [facility]  [phone]

## 2019-10-24 ENCOUNTER — Other Ambulatory Visit: Payer: Self-pay

## 2019-10-24 ENCOUNTER — Encounter: Payer: Self-pay | Admitting: Allergy & Immunology

## 2019-10-24 ENCOUNTER — Ambulatory Visit (INDEPENDENT_AMBULATORY_CARE_PROVIDER_SITE_OTHER): Payer: Medicare Other | Admitting: Allergy & Immunology

## 2019-10-24 ENCOUNTER — Ambulatory Visit (INDEPENDENT_AMBULATORY_CARE_PROVIDER_SITE_OTHER): Payer: Medicare Other

## 2019-10-24 VITALS — BP 162/96 | HR 101 | Temp 97.9°F | Resp 18 | Ht 62.0 in | Wt 156.8 lb

## 2019-10-24 DIAGNOSIS — T63441D Toxic effect of venom of bees, accidental (unintentional), subsequent encounter: Secondary | ICD-10-CM

## 2019-10-24 DIAGNOSIS — J310 Chronic rhinitis: Secondary | ICD-10-CM | POA: Diagnosis not present

## 2019-10-24 DIAGNOSIS — J4520 Mild intermittent asthma, uncomplicated: Secondary | ICD-10-CM | POA: Diagnosis not present

## 2019-10-24 NOTE — Patient Instructions (Addendum)
1. Mild intermittent asthma - Continue montelukast 10mg  daily.   - Continue with ProAir 2 puffs every 4-6 hours as needed.  - Lung testing looks great today, so I do not think that we need to add on a daily inhaled medication.  - Call us if the new nose spray does not work.   2. Adverse food reactions (multiple foods) - Continue to avoid all of your foods.  - EpiPen refilled today.     3. Chronic rhinitis - Continue with nasal saline as needed.  - Continue with ipratropium nasal spray one spray per nostril up to four times today (THIS WILL DRY OUT YOUR NOSE). - Continue with loratadine (Claritin) 10 mg twice daily.    4. 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    5. Concern for COVID19 vaccine reaction - We will add you to the list for testing to the allergenic components. - This is likely something to start in late January or early February.   6. Return in about 6 months (around 04/22/2020). This can be an in-person, a virtual Webex or a telephone follow up visit.   Please inform us of any Emergency Department visits, hospitalizations, or changes in symptoms. Call us before going to the ED for breathing or allergy symptoms since we might be able to fit you in for a sick visit. Feel free to contact us anytime with any questions, problems, or concerns.  It was a pleasure to see you again today!  Websites that have reliable patient information: 1. American Academy of Asthma, Allergy, and Immunology: www.aaaai.org 2. Food Allergy Research and Education (FARE): foodallergy.org 3. Mothers of Asthmatics: http://www.asthmacommunitynetwork.org 4. American College of Allergy, Asthma, and Immunology: www.acaai.org  "Like" [place] on Facebook and Instagram for our latest updates!        Make sure you are registered to vote! If you have moved or changed any of your contact information, you will need to get this updated before voting!  In some cases, you MAY  be able to register to vote online: [email or web address]

## 2019-10-24 NOTE — Progress Notes (Signed)
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 Mild intermittent asthma - Continue montelukast 10mg  daily.   - Continue with ProAir 2 puffs every 4-6 hours as needed.  - Lung testing looks great today, so I do not think that we need to add on a daily inhaled medication.  - Call us if the new nose spray does not work.   2. Adverse food reactions (multiple foods) - Continue to avoid all of your foods.  - EpiPen refilled today.     3. Chronic rhinitis - Continue with nasal saline as needed.  - Continue with ipratropium nasal spray one spray per nostril up to four times today (THIS WILL DRY OUT YOUR NOSE). - Continue with loratadine (Claritin) 10 mg twice daily.    4. 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    5. Concern for COVID19 vaccine reaction - We will add you to the list for testing to the allergenic components. - This is likely something to start in late January or early February.   6. Return in about 6 months (around 04/22/2020). This can be an in-person, a virtual Webex or a telephone follow up visit.  Subjective:   Sherri Murray is a 79 y.o. female presenting today for follow up of  Chief Complaint  Patient presents with  . Asthma  . Food Intolerance  . Allergic Rhinitis     Sore throat     Sherri Murray has a history of the following: Patient Active Problem List   Diagnosis Date Noted  . Multiple food allergies 07/27/2018  . Gastrointestinal intolerance to foods 07/27/2018  . H/O total shoulder replacement, right 05/03/2017  . Secondary osteoarthritis of shoulder, right 02/05/2017  . DJD of right shoulder 02/05/2017  . Fibromyalgia 01/27/2017  . Primary  osteoarthritis of both hands 01/27/2017  . Post-traumatic osteoarthritis of right shoulder 12/23/2016  . PAC (premature atrial contraction) 12/08/2016  . Adhesive capsulitis of right shoulder 08/18/2016  . External hemorrhoids without complication [id number]  . Osteoarthritis of right knee 12/25/2015  . H/O total knee replacement, right 12/11/2015  . Advanced directives, counseling/discussion 10/18/2015  . Total body pain 08/13/2015  . Hearing loss due to cerumen impaction, right 07/05/2015  . Diastolic dysfunction 0000000  . Mild mitral regurgitation 12/20/2014  . Benign essential HTN 10/19/2014  . High cholesterol 05/01/2014  . Prediabetes 05/01/2014  . Allergic rhinitis 09/28/2013  . Mild intermittent asthma 09/28/2013  . Ischemic colitis, hx of 09/28/2013  . Hx of migraines 09/28/2013  . GERD (gastroesophageal reflux disease) 09/28/2013  . 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 Varicose veins of lower extremities with other complications [id number]  . Osteoporosis screening 04/21/2013  . Incontinence of urine   . IBS (irritable bowel syndrome) 02/27/2012    History obtained from: chart review and patient.  Sherri Murray is a 79 y.o. female presenting for a follow up visit. She was last seen in October 2020. At that time, we continued with montelukast as well as albuterol as needed. She has a history of anaphylaxis to multiple foods; we recommended continued avoidance. We also continued with venom immunotherapy.   Since the last visit, she has mostly done well. Her ex son-in-law continues to have problems with ALS and he is declining  quite a bit. Her daughter (the son-in-law's ex wife) has moved in as well as his son (Sherri Murray's grandson). This seems to be a team approach and everyone's workplaces have been very accommodating in allowing this to all work out.   She is having some problem   Asthma/Respiratory Symptom History: She has not used albuterol in over four years at  this point.  Shikara's asthma has been well controlled. She has not required rescue medication, experienced nocturnal awakenings due to lower respiratory symptoms, nor have activities of daily living been limited. She has required no Emergency Department or Urgent Care visits for her asthma. She has required zero courses of systemic steroids for asthma exacerbations since the last visit. ACT score today is 25, indicating excellent asthma symptom control.   Allergic Rhinitis Symptom History: She is having some problems with hoarseness. She will sometimes have problems with pollens, so this is typically a good time of the year for her. She does not take Claritin twice daily as was recommended. She has not needed antibiotics at all.   Food Allergy Symptom History: She does need a new EpiPen. She continues to avoid all of her triggering foods. There have been no accidental reactions at all.   Hymenoptera Allergy Symptom History: Venom immunotherapy is going well. She has had no reactions since 2019.   She did get a tiny bit sick from her the flu shot. She is concerned with the COVID vaccine and has a lot of questions about this.   Otherwise, there have been no changes to her past medical history, surgical history, family history, or social history.    Review of Systems  Constitutional: Negative.  Negative for chills, fever, malaise/fatigue and weight loss.  HENT: Negative.  Negative for congestion, ear discharge and ear pain.   Eyes: Negative for pain, discharge and redness.  Respiratory: Negative for cough, sputum production, shortness of breath and wheezing.   Cardiovascular: Negative.  Negative for chest pain and palpitations.  Gastrointestinal: Negative for abdominal pain, constipation, diarrhea, heartburn, nausea and vomiting.  Skin: Negative.  Negative for itching and rash.  Neurological: Negative for dizziness and headaches.  Endo/Heme/Allergies: Negative for environmental allergies. Does  not bruise/bleed easily.       Objective:   Blood pressure (!) 162/96, pulse (!) 101, temperature 97.9 F (36.6 C), temperature source Temporal, resp. rate 18, height 5\' 2"  (1.575 m), weight 156 lb 12.8 oz (71.1 kg), SpO2 96 %. Body mass index is 28.68 kg/m.   Physical Exam:  Physical Exam  Constitutional: She appears well-developed.  Talkative female.   HENT:  Head: Normocephalic and atraumatic.  Right Ear: Tympanic membrane, external ear and ear canal normal.  Left Ear: Tympanic membrane and ear canal normal.  Nose: No mucosal edema, rhinorrhea, nasal deformity or septal deviation. No epistaxis. Right sinus exhibits no maxillary sinus tenderness and no frontal sinus tenderness. Left sinus exhibits no maxillary sinus tenderness and no frontal sinus tenderness.  Mouth/Throat: Uvula is midline and oropharynx is clear and moist. Mucous membranes are not pale and not dry.  Eyes: Pupils are equal, round, and reactive to light. Conjunctivae and EOM are normal. Right eye exhibits no chemosis and no discharge. Left eye exhibits no chemosis and no discharge. Right conjunctiva is not injected. Left conjunctiva is not injected.  Cardiovascular: Normal rate, regular rhythm and normal heart sounds.  Respiratory: Effort normal and breath sounds normal. No accessory muscle usage. No tachypnea. No respiratory distress. She has no wheezes. She has no  rhonchi. She has no rales. She exhibits no tenderness.  Moving air well in all lung fields. No increased work of breathing noted.  Lymphadenopathy:    She has no cervical adenopathy.  Neurological: She is alert.  Skin: No abrasion, no petechiae and no rash noted. Rash is not papular, not vesicular and not urticarial. No erythema. No pallor.  No eczematous or urticarial lesions noted.   Psychiatric: She has a normal mood and affect.     Diagnostic studies: none      Salvatore Marvel, MD  [facility]

## 2019-10-25 ENCOUNTER — Encounter: Payer: Self-pay | Admitting: Allergy & Immunology

## 2019-11-10 ENCOUNTER — Encounter: Payer: Self-pay | Admitting: *Deleted

## 2019-11-10 ENCOUNTER — Other Ambulatory Visit: Payer: Self-pay | Admitting: *Deleted

## 2019-11-10 NOTE — Patient Outreach (Addendum)
Aging Gracefully Program  RN Visit # 3  11/10/2019  Staisha Vicary 1940-12-05 [id number]  Visit:   RN Visit # 3  Start Time:   12:15 pm End Time:   12:35 pm Total Minutes:   20 minutes  Patient contacted me by phone today, stating that she needed to cancel in-person appointment with me today; explained to patient that I did not have an appointment scheduled with her for today, however, she is agreeable to our completing RN Visit # 3 today during our phone call, since we are already on the phone.  HIPAA/ identity verified.  Patient reports that she had a visit with me written down on her calendar, however, she must have an scheduled appointment this afternoon with the Aging Gracefully OT instead; I clarified that according to what I can see scheduled in EMR, patient's OT appointment is scheduled for Monday 11/13/19-- reviewed with patient next scheduled OT appointment time and encouraged her to complete OT visit as currently scheduled.   Universal RN Interventions: Calendar Distribution: Yes(reviewed with client use of Aging Gracefully calendar- patient reports has not been using much) Exercise Review: Yes(Reviewed with client- reports she has started performing exercises, but has not incorporated in to her regular routine) Fall Prevention: Yes(Reviewed with client; reports several falls without injury; states she "trips over her feet" and is clumsy; reports that she can not unlearn her tendency to rush to complete activities; fall prevention brainstorming completed)  Healthcare Provider Communication: Did Higher education careers adviser With [employer] Provider?: No  Clinician View of Client Situation: Clinician View Of Client Situation: [name] continues to make slow/ gradual progress in meeting her previously established Aging Gracefully goals, however she continues to report ongoing tendency to rush to complete daily activities, which has lead to her having several falls since RN visit # 2,  all without reported injury.  We reviewed all previously discussed strategies for fall prevention, and brainstormed that patient may consider use of cane to prevent falling.  Reports occasionally performing Aging Gracefully exercises at her home but has not incorporated in to her regular routine.  Reports that she has decided not to explore options around physical therapy, as she has tried this before and did not believe it was helpful.  Reports that home modifications are in progress and are almost completed   Client's View of His/Her Situation: Client View Of His/Her Situation: "Things are just so busy at my home, I have not had time to start most of the things we have talked about over our last visits.  I occasionally do the exercises, but not regularly.  I can not seem to un-learn how to not rush, so that is still the main reason that I find myself tripping and falling.  I still have the list you sent me earlier this month, so I can refer to it to start the things I need to do, whenever I have time.... too much is going on right now for me start them now, but I will try to start using my cane more often."  OT Update: Contacted Guadelupe Sabin, Aging Gracefully OT by phone to update on call with patient and her confusion around scheduled appointments; we also discussed patient's slow progress/ apparent lack of motivation/ ongoing frequent reports of falls without injury/ lack of personal action in taking steps to meet her previously established Aging Gracefully goals    Session Summary: RN Visit # 3 completed by phone with patient today, in response to her outreach call; patient  reports that she has not taken many definitive steps to meet her established Aging Gracefully goals, and she was encouraged to begin taking small actions to incorporate strategies into her daily life to meet her established goals; brainstorming was completed with patient today, and she has decided that she does not wish to pursue  outpatient physical therapy, and added prn use of cane at home to her strategies today.  Although patient is making very slow/ gradual progress toward meeting her established Aging Gracefully goals, she is on track toward same.  Patient reports that her home modifications have been started and are well in progress.  Patient declines need for additional AVS after today's RN visit.  Oneta Rack, RN, BSN, [employer] [facility]  [phone]

## 2019-11-10 NOTE — Patient Instructions (Signed)
Patient declines need for AVS to be mailed to her today: this was reviewed over phone with patient   CLIENT/RN [place]  Registered Nurse:  Reginia Naas RN Date: September 26, 2019  Client Name:Sherri Murray Client ID:    Target Area:  FALL PREVENTION    Why Problem May Occur:  Disorganization in home with recent family changes  Disorganization with medications for pain Chronic generalized pain Previous Falls Urine/ bowel Incontinence   Target Goal:   STRATEGIES Coping Strategies Ideas  . Change Positions Slowly: lying to sitting, sitting to standing . Changing positions can make people lightheaded. . When getting up in the morning sit for a few minutes, before standing.  . Stand for a few minutes before walking and hold on to sturdy furniture or countertop.  . Don't rush  . Visualize completion of activities in your mind before you begin them  Drink water . Dehydration can make people dizzy. . Ask your healthcare provider how much water you can drink. . Decrease caffeine, caffeine makes you urinate a lot, which can make you dehydrated . Although you enjoy coffee, you may want to consider trying "half-caff" coffee products to help with urinary incontinence  If you fall, tell someone . Tell a friend or family member even if you didn't get hurt. . Tell your healthcare provider if you fall.  They can help you figure out why. . Use your health calendar to write down any falls your experience and why you fell  Get your vision and hearing checked . Poor vision and hearing loss can make people fall. . Glaucoma and diabetes can cause poor vision. . Make annual appointment for eye exam . Consider making appointment for hearing exam  Other .    Prevention Ideas  Review your Medicines . Many medicines can make you dizzy or sleepy and increase your risk of falling. . Your Aging Gracefully Nurse will look at your medications and see if you are taking any that might  cause falling. . Discuss your use of over-the counter (OTC) medications for pain relief with your PCP; ask her which pain medication is best for the type of pain you have . Start by trying one OTC medication before you take others, so you know what helps and what does not help . Consider asking your pharmacy about blister-packaging for your regularly prescribed medications so they are more organized  Activity and Exercise . Start performing Aging Gracefully exercises that we reviewed during our visit . Walking (inside or outside)- always wear shoes with good soles that are not slick on the bottom . Dancing . Gardening . Housework:  cooking, Education administrator, Medical sales representative . Exercise while watching TV . Swimming or water aerobics. . Plan for changes in weather ahead of time and dress accordingly . Start new exercise programs by beginning conservatively and gradually increasing over time  Strengthen Bones . Exercise makes your bones stronger. . Vitamin D and Calcium make your bones stronger.  Ask your Healthcare provider if you should be taking them. . Your body makes vitamin D from the sun.  Sit in the sun for 10 minutes every day (without sunscreen). .   Control your urine  . Prevent constipation . Cut back on caffeinated drinks. . Don't wait to urinate, but do not rush to get to bathroom; try to go to bathroom before you feel urgency. . If diabetic, control your blood sugar. . Ask your Aging Gracefully Nurse and Healthcare Provider  about  urine control.  Tell your PCP that you are not sure if your medication for urine incontinence is working .   Control your blood sugar (if you are diabetic) . High blood sugar can cause frequent urination, poor vision, and numbness in your feet, which can make you fall.   . Low blood sugar can cause confusion and dizziness. .   Other coping strategies 1. Continue taking warm showers to help with chronic pain 2.  Consider "backing truck in" your driveway, so you can  pull straight out of the drive and do not strain your neck and cause more pain, which increases your fall risk 3. 4. 5.   PRACTICE It is important to practice the strategies so we can determine if they will be effective in helping to reach the goal.    Follow these specific recommendations: 10/20/19 brainstorming in red text; 11/10/19 brainstorming in blue text  -- Don't rush  -- Visualize completion of activities in your mind before you begin them       -- Although you enjoy coffee, you may want to consider trying "half-caff" coffee     products to help with urinary incontinence       -- Use your health calendar to write down any falls your experience and why you fell -- Make annual appointment for eye exam -- Consider making appointment for hearing exam -- Discuss your use of over-the counter (OTC) medications for pain relief with your PCP; ask her which pain medication is best for the type of pain you have -- Start by trying one OTC medication before you take others, so you know what helps and what does not help -- Consider asking your pharmacy about blister-packaging for your regularly prescribed medications so they are more organized -- Continue performing Aging Gracefully exercises that we reviewed during our first visit-- gradually increase the time you perform exercises each week, and try to incorporate into your regular routine each week -- Walking (inside or outside)- always wear shoes with good soles that are not slick on the bottom -- 11/10/2019: consider taking your cane with you and beginning to use your cane to prevent falls and tripping -- Plan for changes in weather ahead of time and dress accordingly -- Start new exercise programs by beginning conservatively and gradually increasing over time -- Don't wait to urinate, but do not rush to get to bathroom; try to go to bathroom before you feel urgency -- Tell your PCP that you are not sure if your medication for urine incontinence  is working -- Continue taking warm showers to help with chronic pain -- Consider "backing truck in" your driveway, so you can pull straight out of the drive and do not strain your neck and cause more pain, which increases your fall risk : 10/20/2019: Good job at starting this practice- I'm glad to hear it is helping! -- Consider setting your alarm 5 minutes early each day so you are able to stretch before getting out of bed and do not have to rush when getting out of bed -- Keep using the Assistive Equipment that have been provided/ reviewed by the Occupational Therapist:  I'm glad to hear these are helping! -- Continue efforts to modify your home furnishing so that you have clear pathways     If strategy does not work the first time, try it again.     We may make some changes over the next few sessions.       Waymond Cera  Franchot Gallo, RN, BSN, [facility] Care Management

## 2019-11-13 ENCOUNTER — Other Ambulatory Visit: Payer: Self-pay | Admitting: Occupational Therapy

## 2019-11-14 ENCOUNTER — Telehealth: Payer: Self-pay | Admitting: Family Medicine

## 2019-11-14 ENCOUNTER — Other Ambulatory Visit: Payer: Self-pay

## 2019-11-14 DIAGNOSIS — R7303 Prediabetes: Secondary | ICD-10-CM

## 2019-11-14 DIAGNOSIS — E890 Postprocedural hypothyroidism: Secondary | ICD-10-CM

## 2019-11-14 DIAGNOSIS — E7800 Pure hypercholesterolemia, unspecified: Secondary | ICD-10-CM

## 2019-11-14 NOTE — Telephone Encounter (Signed)
-----   Message from Cloyd Stagers, RT sent at 11/13/2019 10:24 AM EST ----- Regarding: Lab Orders for Wednesday 2.3.2021 Please place lab orders for Wednesday 2.3.2021, office visit for physical on Tuesday 2.9.2021 Thank you, Dyke Maes RT(R)

## 2019-11-15 ENCOUNTER — Ambulatory Visit: Payer: Medicare Other

## 2019-11-15 ENCOUNTER — Ambulatory Visit (INDEPENDENT_AMBULATORY_CARE_PROVIDER_SITE_OTHER): Payer: Medicare Other

## 2019-11-15 ENCOUNTER — Other Ambulatory Visit: Payer: Medicare Other

## 2019-11-15 VITALS — Wt 155.0 lb

## 2019-11-15 DIAGNOSIS — Z0000 Encounter for general adult medical examination without abnormal findings: Secondary | ICD-10-CM

## 2019-11-15 NOTE — Patient Instructions (Signed)
[name] , Thank you for taking time to come for your Medicare Wellness Visit. I appreciate your ongoing commitment to your health goals. Please review the following plan we discussed and let me know if I can assist you in the future.   Screening recommendations/referrals: Colonoscopy: Up to date, completed 02/27/2012 Mammogram: Up to date, completed 04/13/2019 Bone Density: Up to date, completed 09/12/2019 Recommended yearly ophthalmology/optometry visit for glaucoma screening and checkup Recommended yearly dental visit for hygiene and checkup  Vaccinations: Influenza vaccine: Up to date, completed 09/02/2019 Pneumococcal vaccine: Completed series Tdap vaccine: Up to date, completed 10/12/2010 Shingles vaccine: discussed     Advanced directives: Please bring a copy of your POA (Power of [place]) and/or Living Will to your next appointment.   Conditions/risks identified: hypertension, hyperlipidemia   Next appointment: 11/21/2019 @ 11:20 am    Preventive Care 65 Years and Older, Female Preventive care refers to lifestyle choices and visits with your health care provider that can promote health and wellness. What does preventive care include?  A yearly physical exam. This is also called an annual well check.  Dental exams once or twice a year.  Routine eye exams. Ask your health care provider how often you should have your eyes checked.  Personal lifestyle choices, including:  Daily care of your teeth and gums.  Regular physical activity.  Eating a healthy diet.  Avoiding tobacco and drug use.  Limiting alcohol use.  Practicing safe sex.  Taking low-dose aspirin every day.  Taking vitamin and mineral supplements as recommended by your health care provider. What happens during an annual well check? The services and screenings done by your health care provider during your annual well check will depend on your age, overall health, lifestyle risk factors, and family history  of disease. Counseling  Your health care provider may ask you questions about your:  Alcohol use.  Tobacco use.  Drug use.  Emotional well-being.  Home and relationship well-being.  Sexual activity.  Eating habits.  History of falls.  Memory and ability to understand (cognition).  Work and work Statistician.  Reproductive health. Screening  You may have the following tests or measurements:  Height, weight, and BMI.  Blood pressure.  Lipid and cholesterol levels. These may be checked every 5 years, or more frequently if you are over 53 years old.  Skin check.  Lung cancer screening. You may have this screening every year starting at age 30 if you have a 30-pack-year history of smoking and currently smoke or have quit within the past 15 years.  Fecal occult blood test (FOBT) of the stool. You may have this test every year starting at age 61.  Flexible sigmoidoscopy or colonoscopy. You may have a sigmoidoscopy every 5 years or a colonoscopy every 10 years starting at age 49.  Hepatitis C blood test.  Hepatitis B blood test.  Sexually transmitted disease (STD) testing.  Diabetes screening. This is done by checking your blood sugar (glucose) after you have not eaten for a while (fasting). You may have this done every 1-3 years.  Bone density scan. This is done to screen for osteoporosis. You may have this done starting at age 49.  Mammogram. This may be done every 1-2 years. Talk to your health care provider about how often you should have regular mammograms. Talk with your health care provider about your test results, treatment options, and if necessary, the need for more tests. Vaccines  Your health care provider may recommend certain vaccines,  such as:  Influenza vaccine. This is recommended every year.  Tetanus, diphtheria, and acellular pertussis (Tdap, Td) vaccine. You may need a Td booster every 10 years.  Zoster vaccine. You may need this after age  36.  Pneumococcal 13-valent conjugate (PCV13) vaccine. One dose is recommended after age 64.  Pneumococcal polysaccharide (PPSV23) vaccine. One dose is recommended after age 89. Talk to your health care provider about which screenings and vaccines you need and how often you need them. This information is not intended to replace advice given to you by your health care provider. Make sure you discuss any questions you have with your health care provider. Document Released: 10/25/2015 Document Revised: 06/17/2016 Document Reviewed: 07/30/2015 Elsevier Interactive Patient Education  2017 [place] Prevention in the Home Falls can cause injuries. They can happen to people of all ages. There are many things you can do to make your home safe and to help prevent falls. What can I do on the outside of my home?  Regularly fix the edges of walkways and driveways and fix any cracks.  Remove anything that might make you trip as you walk through a door, such as a raised step or threshold.  Trim any bushes or trees on the path to your home.  Use bright outdoor lighting.  Clear any walking paths of anything that might make someone trip, such as rocks or tools.  Regularly check to see if handrails are loose or broken. Make sure that both sides of any steps have handrails.  Any raised decks and porches should have guardrails on the edges.  Have any leaves, snow, or ice cleared regularly.  Use sand or salt on walking paths during winter.  Clean up any spills in your garage right away. This includes oil or grease spills. What can I do in the bathroom?  Use night lights.  Install grab bars by the toilet and in the tub and shower. Do not use towel bars as grab bars.  Use non-skid mats or decals in the tub or shower.  If you need to sit down in the shower, use a plastic, non-slip stool.  Keep the floor dry. Clean up any water that spills on the floor as soon as it happens.  Remove  soap buildup in the tub or shower regularly.  Attach bath mats securely with double-sided non-slip rug tape.  Do not have throw rugs and other things on the floor that can make you trip. What can I do in the bedroom?  Use night lights.  Make sure that you have a light by your bed that is easy to reach.  Do not use any sheets or blankets that are too big for your bed. They should not hang down onto the floor.  Have a firm chair that has side arms. You can use this for support while you get dressed.  Do not have throw rugs and other things on the floor that can make you trip. What can I do in the kitchen?  Clean up any spills right away.  Avoid walking on wet floors.  Keep items that you use a lot in easy-to-reach places.  If you need to reach something above you, use a strong step stool that has a grab bar.  Keep electrical cords out of the way.  Do not use floor polish or wax that makes floors slippery. If you must use wax, use non-skid floor wax.  Do not have throw rugs and other things on  the floor that can make you trip. What can I do with my stairs?  Do not leave any items on the stairs.  Make sure that there are handrails on both sides of the stairs and use them. Fix handrails that are broken or loose. Make sure that handrails are as long as the stairways.  Check any carpeting to make sure that it is firmly attached to the stairs. Fix any carpet that is loose or worn.  Avoid having throw rugs at the top or bottom of the stairs. If you do have throw rugs, attach them to the floor with carpet tape.  Make sure that you have a light switch at the top of the stairs and the bottom of the stairs. If you do not have them, ask someone to add them for you. What else can I do to help prevent falls?  Wear shoes that:  Do not have high heels.  Have rubber bottoms.  Are comfortable and fit you well.  Are closed at the toe. Do not wear sandals.  If you use a  stepladder:  Make sure that it is fully opened. Do not climb a closed stepladder.  Make sure that both sides of the stepladder are locked into place.  Ask someone to hold it for you, if possible.  Clearly mark and make sure that you can see:  Any grab bars or handrails.  First and last steps.  Where the edge of each step is.  Use tools that help you move around (mobility aids) if they are needed. These include:  Canes.  Walkers.  Scooters.  Crutches.  Turn on the lights when you go into a dark area. Replace any light bulbs as soon as they burn out.  Set up your furniture so you have a clear path. Avoid moving your furniture around.  If any of your floors are uneven, fix them.  If there are any pets around you, be aware of where they are.  Review your medicines with your doctor. Some medicines can make you feel dizzy. This can increase your chance of falling. Ask your doctor what other things that you can do to help prevent falls. This information is not intended to replace advice given to you by your health care provider. Make sure you discuss any questions you have with your health care provider. Document Released: 07/25/2009 Document Revised: 03/05/2016 Document Reviewed: 11/02/2014 Elsevier Interactive Patient Education  2017 [employer].
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
## 2019-11-15 NOTE — Patient Outreach (Signed)
Aging Gracefully Program  OT FINAL Visit  11/15/2019  Sherri Murray Oct 15, 1940 [id number]  Visit:  3- Third Visit  Start Time:  1000 End Time:  [id number] Total Minutes:  24   Readiness to Change:  Readiness to Change Score: 6  Durable Medical Equipment: Adaptive Equipment: Other(bathtub grippers) Adaptive Equipment Distribution Date: 11/13/19  Patient Education: Education Provided: Yes Education Details: Provided Tips for Independent Living book and reviewed fall prevention strategies. Discussed AE and how to install and use when ready. Person(s) Educated: Patient Comprehension: Verbalized Understanding  Goals: Goals Addressed            This Visit's Progress   . COMPLETED: Patient Stated       Improve ability to get into and out of bathtub safely and independently.   *Goal has been deferred due to home set-up and bathroom wall design preventing modifications including installation of grab bars or tub modifications.    *Pt provided with tub grippers to install on shower floor or bathtub floor to improve grip if she continues to insist on getting in the tub to soak. Grippers will provide traction when attempting to stand in wet tub.     . COMPLETED: Patient Stated       Improve ability to climb steps to and from bedroom safely.   ACTION PLANNING - FUNCTIONAL MOBILITY Target Problem Area: Getting in and out of bedroom   Why Problem May Occur: -no railings -steep steps -frequent falls -poor mobility and awareness of deficits    Target Goal: To improve safety getting into and out of bedroom using steps   STRATEGIES Saving Your Energy: DO: DON'T:  Take breaks    Raise the height of surfaces    Take frequent rests. Just taking 15 minutes in a comfortable chair before becoming fatigued may help to restore your energy   Remove tripping hazards     Other    Modifying your home environment and making it safe: DO: DON'T:  Use installed railing Hold onto  unsafe surfaces (towel racks, shower curtains, soap dishes, etc)  Remove or strongly secure throw rugs    Provide adequate lighting Use dim lights or lights that cast a lot of shadows  Other   Other    Simplifying the way you set up tasks or daily routines: DO: DON'T:  Move slowly Rush during transfers or walking   Other   Other    Practice It is important to practice the strategies so we can determine if they will be effective in helping to reach your goal. Follow these specific recommendations: 1. Use railing on steps 2.   Take your time and watch where you are stepping 3.   Don't attempt to carry items up and down steps  If a strategy does not work the first time, try it again and again (and maybe again). We may make some changes over the next few sessions, based on how they work.   Guadelupe Sabin, OTR/L  [phone]          Post Clinical Reasoning: Client Action (Goal) One Interventions: Improve ability to get in and out of bedroom using steps. Did Client Try?: Yes Targeted Problem Area Status: A Lot Better Client Action (Goal) Two Interventions: Improve safety during bathing tasks. Did Client Try?: No Targeted Problem Area Status: The Same  Clinician View Of Client Situation:: Handrail at bedroom steps greatly improves pt safety with getting into and out of the bedroom. Goal of getting in and out  of tub safely is deferred due to home set-up/design preventing installation of grab bars. OT did provide bathtub grippers for improved safety in the shower and tub when standing for showering or attempting to get in and out of the tub. Pt does not recognize fall risks around the house including lots of throw rugs, furniture, boxes, clutter around the house. Pt is appreciative of home modifications however is unable to generalize aging gracefully strategies to begin problem solving on her own, she is not ready to change or adjust her lifestyle to improve her safety and mobility  in the home.  Client View Of His/Her Situation:: Pt is pleased with her handrailing. Reports good use of previously provided equipment (bedrail and step). Pt feels she can still get into and out of the tub, even without grab bars.  Next Visit Plan:: N/A-pt is discharged from OT [facility]   Guadelupe Sabin, OTR/L  [phone] 11/15/2019

## 2019-11-15 NOTE — Progress Notes (Signed)
PCP notes:  Health Maintenance: No gaps noted   Abnormal Screenings: none   Patient concerns: none   Nurse concerns: none   Next PCP appt.: 11/21/2019 @ 11:20 am

## 2019-11-16 ENCOUNTER — Encounter: Payer: Medicare Other | Admitting: *Deleted

## 2019-11-21 ENCOUNTER — Ambulatory Visit: Payer: Self-pay

## 2019-11-21 ENCOUNTER — Ambulatory Visit (INDEPENDENT_AMBULATORY_CARE_PROVIDER_SITE_OTHER): Payer: Medicare Other | Admitting: Family Medicine

## 2019-11-21 ENCOUNTER — Other Ambulatory Visit: Payer: Self-pay

## 2019-11-21 ENCOUNTER — Encounter: Payer: Self-pay | Admitting: Family Medicine

## 2019-11-21 ENCOUNTER — Encounter: Payer: Self-pay | Admitting: *Deleted

## 2019-11-21 ENCOUNTER — Encounter: Payer: Medicare Other | Admitting: Family Medicine

## 2019-11-21 VITALS — BP 140/78 | HR 62 | Temp 98.5°F | Ht 63.0 in | Wt 155.8 lb

## 2019-11-21 DIAGNOSIS — M797 Fibromyalgia: Secondary | ICD-10-CM | POA: Diagnosis not present

## 2019-11-21 DIAGNOSIS — R1084 Generalized abdominal pain: Secondary | ICD-10-CM | POA: Diagnosis not present

## 2019-11-21 DIAGNOSIS — R7303 Prediabetes: Secondary | ICD-10-CM | POA: Diagnosis not present

## 2019-11-21 DIAGNOSIS — N3281 Overactive bladder: Secondary | ICD-10-CM | POA: Diagnosis not present

## 2019-11-21 DIAGNOSIS — Z0000 Encounter for general adult medical examination without abnormal findings: Secondary | ICD-10-CM

## 2019-11-21 DIAGNOSIS — I10 Essential (primary) hypertension: Secondary | ICD-10-CM

## 2019-11-21 LAB — CBC WITH DIFFERENTIAL/PLATELET
Basophils Absolute: 0 10*3/uL (ref 0.0–0.1)
Basophils Relative: 0.2 % (ref 0.0–3.0)
Eosinophils Absolute: 0.2 10*3/uL (ref 0.0–0.7)
Eosinophils Relative: 2.4 % (ref 0.0–5.0)
HCT: 38.5 % (ref 36.0–46.0)
Hemoglobin: 12.7 g/dL (ref 12.0–15.0)
Lymphocytes Relative: 43.7 % (ref 12.0–46.0)
Lymphs Abs: 3.3 10*3/uL (ref 0.7–4.0)
MCHC: 32.9 g/dL (ref 30.0–36.0)
MCV: 94.1 fl (ref 78.0–100.0)
Monocytes Absolute: 0.5 10*3/uL (ref 0.1–1.0)
Monocytes Relative: 7.1 % (ref 3.0–12.0)
Neutro Abs: 3.5 10*3/uL (ref 1.4–7.7)
Neutrophils Relative %: 46.6 % (ref 43.0–77.0)
Platelets: 223 10*3/uL (ref 150.0–400.0)
RBC: 4.1 Mil/uL (ref 3.87–5.11)
RDW: 13 % (ref 11.5–15.5)
WBC: 7.6 10*3/uL (ref 4.0–10.5)

## 2019-11-21 LAB — LIPASE: Lipase: 50 U/L (ref 11.0–59.0)

## 2019-11-21 MED ORDER — OXYBUTYNIN CHLORIDE ER 15 MG PO TB24: ORAL_TABLET | ORAL | 11 refills | Status: DC

## 2019-11-21 MED ORDER — OXYBUTYNIN CHLORIDE ER 10 MG PO TB24: ORAL_TABLET | ORAL | 3 refills | Status: DC
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
## 2019-11-21 NOTE — Patient Instructions (Addendum)
Please stop at the lab to have labs drawn.  INcrease oxybutynin to 15 mg at bedtime.   Continue vit D3 supplement... take about 1000 units daily.  If abdominal pain...  Return to see GI MD.

## 2019-11-24 ENCOUNTER — Other Ambulatory Visit: Payer: Self-pay | Admitting: Family Medicine

## 2019-11-28 ENCOUNTER — Ambulatory Visit (INDEPENDENT_AMBULATORY_CARE_PROVIDER_SITE_OTHER): Payer: Medicare Other

## 2019-11-28 DIAGNOSIS — T63441D Toxic effect of venom of bees, accidental (unintentional), subsequent encounter: Secondary | ICD-10-CM | POA: Diagnosis not present

## 2019-12-07 ENCOUNTER — Other Ambulatory Visit: Payer: Self-pay | Admitting: *Deleted

## 2019-12-07 NOTE — Patient Outreach (Signed)
Aging Psychiatrist Visit # 4- final visit- completed by phone  12/07/2019  Sherri Murray [birth year] [id number]  Visit:   RN Visit # 4  Start Time:   10:35 am End Time:   11:00 am Total Minutes:   25 minutes  Readiness To Change Score:  Readiness to Change Score: 6  Universal RN Interventions: Calendar Distribution: Yes(reviewed with patient today- reports using and helping) Exercise Review: Yes(Reviewed with client today- reports continues doing exercises prn) Medications: Yes(Reviewed with patient today- has not made many changes; has not yet requested blister packaging from pharmacy; has discussed medications with PCP recently) Medication Changes: No(Client reports no changes in medications but reports her incontinence medication dose was increased, which is helping) Pain: Yes(Reviewed with patient today) PCP Advocacy/Support: Yes(Reviewed with client today- attended recent PCP office visit; reports ongoing good rapport with PCP) Fall Prevention: Yes(Reviewed previous strategies with client today; reports she has made small changes gradually to decrease fall risks but continues experiencing falls without injury; unable to quanitfy falls; not recording in calendar) Incontinence: Yes(reviewed with patient today- reports incontience better with increased medication dose prescribed by PCP recently)  Healthcare Provider Communication: Did Higher education careers adviser With [employer] Provider?: No  Clinician View of Client Situation: Public affairs consultant Of Client Situation: Client reports that she has not been able to definitively make any permanent changes around previously discussed strategies/ brainstorming efforts around fall prevention; she reports biggest helpful change has been the completion of the home modifications which are now completed.  We discussed her efforts to decrease her tendency to rush, which has previously led to falls, and she believes that she is not rushing to  accomplish tasks as much as she used to.  Client reports ongoing slow progress in recommended strategies around improving home organiztion/ clutter.  Overall, she has made slow progress but has remained on track in meeting her previously established Aging Gracefully goals, and she is able to verbalize the recommended strategies, although she has not taken steps to implement many of them.   Client's View of His/Her Situation: Client View Of His/Her Situation: "I just have so much on my plate with all of my family members living with me, I have not been able to make many of the changes, but I will get there eventually.  I am not really using my cane much, and I have not had time to get my home organized, but I try to work on it every week a little at a time.  I am still doing the exercises a couple of times a week and I am using the calendar, which is helpful."  Medication Assessment: reports no changes to medications and acknowledges that she discussed medications with PCP during recent office visit; encouraged to consider obtaining blister packaging through her pharmacy to make her medication management more organized   OT Update: Will update Aging Gracefully team around successful completion of final RN Visit # 4  Session Summary: Reviewed with client home modifications which are now completed; OT visits; and previously established Aging Gracefully goals/ strategies and brainstorming ideas.  Encouraged patient to continue efforts in taking definitive steps at home in meeting the goals she verbalized, as she readily admits that she has not initiated many of the recommended strategies.  Discussed with patient that today's RN visit is the final visit and client verbalizes understanding and agreement.  Will mail patient updated AVS for her ongoing reference/ review  Oneta Rack, RN, BSN, [employer]  Care Coordinator [facility] Care Management  [phone]

## 2019-12-07 NOTE — Patient Instructions (Signed)
Sherri Murray it was nice meeting you and working with you during the Aging Gracefully program you participated in; I am glad to hear that the home modifications are helping.  Today (12/07/19) was our final session, so please keep these recommendations in your records for your ongoing review and reference as you continue to make effort toward meeting the goals you identified during the Aging Gracefully Program.    Take care and stay safe and healthy!   CLIENT/RN ACTION PLAN - FALL PREVENTION  Registered Nurse:  Reginia Naas RN Date: September 26, 2019  Client Name:Sherri Murray Client ID:    Target Area:  FALL PREVENTION    Why Problem May Occur:  Disorganization in home with recent family changes  Disorganization with medications for pain Chronic generalized pain Previous Falls Urine/ bowel Incontinence   Target Goal:   STRATEGIES Coping Strategies Ideas   Change Positions Slowly: lying to sitting, sitting to standing  Changing positions can make people lightheaded.  When getting up in the morning sit for a few minutes, before standing.   Stand for a few minutes before walking and hold on to sturdy furniture or countertop.   Don't rush   Visualize completion of activities in your mind before you begin them  Drink water  Dehydration can make people dizzy.  Ask your healthcare provider how much water you can drink.  Decrease caffeine, caffeine makes you urinate a lot, which can make you dehydrated  Although you enjoy coffee, you may want to consider trying "half-caff" coffee products to help with urinary incontinence  If you fall, tell someone  Tell a friend or family member even if you didn't get hurt.  Write down any falls you have so that you may quantify these episodes when you talk to your doctor  Tell your healthcare provider if you fall.  They can help you figure out why.  Use your health calendar to write down any falls your experience and why you fell  Get your  vision and hearing checked  Poor vision and hearing loss can make people fall.  Glaucoma and diabetes can cause poor vision.  Make annual appointment for eye exam  Consider making appointment for hearing exam  Other     Prevention Ideas  Review your Medicines  Many medicines can make you dizzy or sleepy and increase your risk of falling.  Your Aging Gracefully Nurse will look at your medications and see if you are taking any that might cause falling.  Discuss your use of over-the counter (OTC) medications for pain relief with your PCP; ask her which pain medication is best for the type of pain you have  Start by trying one OTC medication before you take others, so you know what helps and what does not help  Consider asking your pharmacy about blister-packaging for your regularly prescribed medications so they are more organized  Activity and Exercise  Continue performing Aging Gracefully exercises that we reviewed during our visit  Walking (inside or outside)- always wear shoes with good soles that are not slick on the bottom  [employer]:  cooking, cleaning, laundry  Exercise while watching TV  Swimming or water aerobics.  Plan for changes in weather ahead of time and dress accordingly  Start new exercise programs by beginning conservatively and gradually increasing over time  Strengthen Bones  Exercise makes your bones stronger.  Vitamin D and Calcium make your bones stronger.  Ask your Healthcare provider if you should be  taking them.  Your body makes vitamin D from the sun.  Sit in the sun for 10 minutes every day (without sunscreen).    Control your urine   Prevent constipation  Cut back on caffeinated drinks.  Don't wait to urinate, but do not rush to get to bathroom; try to go to bathroom before you feel urgency.  If diabetic, control your blood sugar.  Ask your Aging Gracefully Nurse and Healthcare Provider  about urine control.     Control your blood sugar (if you are diabetic)  High blood sugar can cause frequent urination, poor vision, and numbness in your feet, which can make you fall.    Low blood sugar can cause confusion and dizziness.    Other coping strategies 1. Continue taking warm showers to help with chronic pain 2.  Consider "backing truck in" your driveway, so you can pull straight out of the drive and do not strain your neck and cause more pain, which increases your fall risk 3. 4. 5.   PRACTICE It is important to practice the strategies so we can determine if they will be effective in helping to reach the goal.    Follow these specific recommendations: 10/20/19 brainstorming in red text; 11/10/19 brainstorming in blue text  -- Don't rush  -- Visualize completion of activities in your mind before you begin them       -- Although you enjoy coffee, you may want to consider trying "half-caff" coffee     products to help with urinary incontinence       -- Use your health calendar to write down any falls your experience and why you fell -- Make annual appointment for eye exam -- Consider making appointment for hearing exam -- Discuss your use of over-the counter (OTC) medications for pain relief with your PCP; ask her which pain medication is best for the type of pain you have -- Start by trying one OTC medication before you take others, so you know what helps and what does not help -- Consider asking your pharmacy about blister-packaging for your regularly prescribed medications so they are more organized -- Continue performing Aging Gracefully exercises that we reviewed during our first visit-- gradually increase the time you perform exercises each week, and try to incorporate into your regular routine each week -- Walking (inside or outside)- always wear shoes with good soles that are not slick on the bottom -- 11/10/2019: consider taking your cane with you and beginning to use your cane to prevent falls  and tripping -- Plan for changes in weather ahead of time and dress accordingly -- Start new exercise programs by beginning conservatively and gradually increasing over time -- Don't wait to urinate, but do not rush to get to bathroom; try to go to bathroom before you feel urgency -- Tell your PCP that you are not sure if your medication for urine incontinence is working -- Continue taking warm showers to help with chronic pain -- Consider "backing truck in" your driveway, so you can pull straight out of the drive and do not strain your neck and cause more pain, which increases your fall risk : 10/20/2019: Good job at starting this practice- I'm glad to hear it is helping! -- Consider setting your alarm 5 minutes early each day so you are able to stretch before getting out of bed and do not have to rush when getting out of bed -- Keep using the Assistive Equipment that have been provided/ reviewed by  the Occupational Therapist:  I'm glad to hear these are helping! -- Continue efforts to modify your home furnishing so that you have clear pathways     If strategy does not work the first time, try it again.     Today was our final session, so please keep these recommendations in your records for your ongoing review as you continue to make effort toward meeting the goals you identified during the [place], RN, BSN, [place] Coordinator [facility] Care Management

## 2019-12-09 ENCOUNTER — Other Ambulatory Visit: Payer: Self-pay | Admitting: Rheumatology

## 2019-12-09 ENCOUNTER — Other Ambulatory Visit: Payer: Self-pay | Admitting: Allergy & Immunology

## 2019-12-11 NOTE — Telephone Encounter (Signed)
Last Visit: 09/20/19 Next Visit: 01/09/20 Labs: 09/20/19 Vitamin D is low. Rest the labs are stable  Okay to refill per Dr. Estanislado Pandy

## 2019-12-12 ENCOUNTER — Other Ambulatory Visit: Payer: Self-pay | Admitting: Family Medicine

## 2019-12-18 NOTE — Assessment & Plan Note (Signed)
Stable control. Work on low [employer].

## 2019-12-18 NOTE — Assessment & Plan Note (Signed)
Followed by Rheumatology.Marland Kitchen last OV note reviewed.

## 2019-12-18 NOTE — Assessment & Plan Note (Signed)
Trail of higher dose of oxybutynin.Marland Kitchen discussed possible side effects.

## 2019-12-18 NOTE — Assessment & Plan Note (Signed)
Well controlled. Continue current medication.  

## 2019-12-18 NOTE — Assessment & Plan Note (Signed)
Possibly due to IBS. Eval with labs ( lipase and cbc). If not improving follow u[p with GI for further eval.

## 2019-12-26 ENCOUNTER — Other Ambulatory Visit: Payer: Self-pay

## 2019-12-26 ENCOUNTER — Ambulatory Visit (INDEPENDENT_AMBULATORY_CARE_PROVIDER_SITE_OTHER): Payer: Medicare Other | Admitting: *Deleted

## 2019-12-26 ENCOUNTER — Ambulatory Visit: Payer: Medicare Other | Admitting: Podiatry

## 2019-12-26 DIAGNOSIS — T63441D Toxic effect of venom of bees, accidental (unintentional), subsequent encounter: Secondary | ICD-10-CM

## 2020-01-01 ENCOUNTER — Other Ambulatory Visit: Payer: Self-pay | Admitting: Podiatry
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
## 2020-01-02 ENCOUNTER — Other Ambulatory Visit: Payer: Self-pay

## 2020-01-02 ENCOUNTER — Ambulatory Visit (INDEPENDENT_AMBULATORY_CARE_PROVIDER_SITE_OTHER): Payer: Medicare Other | Admitting: Podiatry

## 2020-01-02 DIAGNOSIS — B351 Tinea unguium: Secondary | ICD-10-CM

## 2020-01-02 DIAGNOSIS — M778 Other enthesopathies, not elsewhere classified: Secondary | ICD-10-CM

## 2020-01-02 DIAGNOSIS — M79676 Pain in unspecified toe(s): Secondary | ICD-10-CM | POA: Diagnosis not present

## 2020-01-02 NOTE — Telephone Encounter (Signed)
Rx meloxicam 15mg  sent to [facility]

## 2020-01-03 ENCOUNTER — Other Ambulatory Visit: Payer: Self-pay | Admitting: Family Medicine

## 2020-01-03 NOTE — Telephone Encounter (Signed)
Last office visit 11/21/2019 for CPE.  Last refilled 10/02/2019 for #[id number] with no refills.  CPE scheduled for 11/22/2020.

## 2020-01-04 ENCOUNTER — Other Ambulatory Visit: Payer: Self-pay | Admitting: Family Medicine
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
## 2020-01-09 ENCOUNTER — Telehealth: Payer: Self-pay | Admitting: Rheumatology

## 2020-01-09 ENCOUNTER — Ambulatory Visit: Payer: Medicare Other | Admitting: Rheumatology

## 2020-01-09 NOTE — Telephone Encounter (Signed)
Patient called stating she had her Shingles vaccine on Friday, 01/05/20 and woke up today with flu-like symptoms.  Patient cancelled her appointment today and is requesting a return call to discuss if the symptoms are a normal reaction.

## 2020-01-09 NOTE — Telephone Encounter (Signed)
Please advise 

## 2020-01-09 NOTE — Telephone Encounter (Signed)
Returned patient call.  Advised it is not uncommon to feel under the weather and have flulike symptoms for a day or 2 after vaccine.  Advised it is her body building up an immune response.  Recommend Tylenol or NSAIDs for symptom relief.  Patient verbalized understanding.  Mariella Saa, PharmD, [place], [place] Clinical Specialty Pharmacist [phone]  01/09/2020 11:47 AM

## 2020-01-16 ENCOUNTER — Other Ambulatory Visit: Payer: Self-pay | Admitting: Allergy & Immunology

## 2020-01-23 ENCOUNTER — Ambulatory Visit (INDEPENDENT_AMBULATORY_CARE_PROVIDER_SITE_OTHER): Payer: Medicare Other

## 2020-01-23 ENCOUNTER — Other Ambulatory Visit: Payer: Self-pay

## 2020-01-23 DIAGNOSIS — T63441D Toxic effect of venom of bees, accidental (unintentional), subsequent encounter: Secondary | ICD-10-CM

## 2020-02-11 ENCOUNTER — Other Ambulatory Visit: Payer: Self-pay | Admitting: Allergy & Immunology

## 2020-02-20 ENCOUNTER — Ambulatory Visit (INDEPENDENT_AMBULATORY_CARE_PROVIDER_SITE_OTHER): Payer: Medicare Other

## 2020-02-20 ENCOUNTER — Other Ambulatory Visit: Payer: Self-pay

## 2020-02-20 DIAGNOSIS — T63441D Toxic effect of venom of bees, accidental (unintentional), subsequent encounter: Secondary | ICD-10-CM

## 2020-02-22 ENCOUNTER — Ambulatory Visit (INDEPENDENT_AMBULATORY_CARE_PROVIDER_SITE_OTHER): Payer: Medicare Other | Admitting: Physician Assistant

## 2020-02-22 ENCOUNTER — Telehealth: Payer: Self-pay | Admitting: Internal Medicine

## 2020-02-22 ENCOUNTER — Other Ambulatory Visit: Payer: Self-pay

## 2020-02-22 ENCOUNTER — Encounter: Payer: Self-pay | Admitting: Physician Assistant

## 2020-02-22 VITALS — BP 134/66 | HR 57 | Resp 13 | Ht 63.0 in | Wt 159.2 lb

## 2020-02-22 DIAGNOSIS — M810 Age-related osteoporosis without current pathological fracture: Secondary | ICD-10-CM

## 2020-02-22 DIAGNOSIS — M797 Fibromyalgia: Secondary | ICD-10-CM | POA: Diagnosis not present

## 2020-02-22 DIAGNOSIS — M19041 Primary osteoarthritis, right hand: Secondary | ICD-10-CM

## 2020-02-22 DIAGNOSIS — I10 Essential (primary) hypertension: Secondary | ICD-10-CM

## 2020-02-22 DIAGNOSIS — Z96651 Presence of right artificial knee joint: Secondary | ICD-10-CM

## 2020-02-22 DIAGNOSIS — R2681 Unsteadiness on feet: Secondary | ICD-10-CM

## 2020-02-22 DIAGNOSIS — M5136 Other intervertebral disc degeneration, lumbar region: Secondary | ICD-10-CM

## 2020-02-22 DIAGNOSIS — Z96611 Presence of right artificial shoulder joint: Secondary | ICD-10-CM

## 2020-02-22 DIAGNOSIS — M25522 Pain in left elbow: Secondary | ICD-10-CM

## 2020-02-22 DIAGNOSIS — I5189 Other ill-defined heart diseases: Secondary | ICD-10-CM

## 2020-02-22 DIAGNOSIS — R7303 Prediabetes: Secondary | ICD-10-CM

## 2020-02-22 DIAGNOSIS — M19042 Primary osteoarthritis, left hand: Secondary | ICD-10-CM

## 2020-02-22 NOTE — Telephone Encounter (Signed)
Left message for patient to call back  

## 2020-02-22 NOTE — Patient Instructions (Addendum)
Hand Exercises Hand exercises can be helpful for almost anyone. These exercises can strengthen the hands, improve flexibility and movement, and increase blood flow to the hands. These results can make work and daily tasks easier. Hand exercises can be especially helpful for people who have joint pain from arthritis or have nerve damage from overuse (carpal tunnel syndrome). These exercises can also help people who have injured a hand. Exercises Most of these hand exercises are gentle stretching and motion exercises. It is usually safe to do them often throughout the day. Warming up your hands before exercise may help to reduce stiffness. You can do this with gentle massage or by placing your hands in warm water for 10-15 minutes. It is normal to feel some stretching, pulling, tightness, or mild discomfort as you begin new exercises. This will gradually improve. Stop an exercise right away if you feel sudden, severe pain or your pain gets worse. Ask your health care provider which exercises are best for you. Knuckle bend or "claw" fist 1. Stand or sit with your arm, hand, and all five fingers pointed straight up. Make sure to keep your wrist straight during the exercise. 2. Gently bend your fingers down toward your palm until the tips of your fingers are touching the top of your palm. Keep your big knuckle straight and just bend the small knuckles in your fingers. 3. Hold this position for __________ seconds. 4. Straighten (extend) your fingers back to the starting position. Repeat this exercise 5-10 times with each hand. Full finger fist 1. Stand or sit with your arm, hand, and all five fingers pointed straight up. Make sure to keep your wrist straight during the exercise. 2. Gently bend your fingers into your palm until the tips of your fingers are touching the middle of your palm. 3. Hold this position for __________ seconds. 4. Extend your fingers back to the starting position, stretching every  joint fully. Repeat this exercise 5-10 times with each hand. Straight fist 1. Stand or sit with your arm, hand, and all five fingers pointed straight up. Make sure to keep your wrist straight during the exercise. 2. Gently bend your fingers at the big knuckle, where your fingers meet your hand, and the middle knuckle. Keep the knuckle at the tips of your fingers straight and try to touch the bottom of your palm. 3. Hold this position for __________ seconds. 4. Extend your fingers back to the starting position, stretching every joint fully. Repeat this exercise 5-10 times with each hand. Tabletop 1. Stand or sit with your arm, hand, and all five fingers pointed straight up. Make sure to keep your wrist straight during the exercise. 2. Gently bend your fingers at the big knuckle, where your fingers meet your hand, as far down as you can while keeping the small knuckles in your fingers straight. Think of forming a tabletop with your fingers. 3. Hold this position for __________ seconds. 4. Extend your fingers back to the starting position, stretching every joint fully. Repeat this exercise 5-10 times with each hand. Finger spread 1. Place your hand flat on a table with your palm facing down. Make sure your wrist stays straight as you do this exercise. 2. Spread your fingers and thumb apart from each other as far as you can until you feel a gentle stretch. Hold this position for __________ seconds. 3. Bring your fingers and thumb tight together again. Hold this position for __________ seconds. Repeat this exercise 5-10 times with each hand.   Making circles 1. Stand or sit with your arm, hand, and all five fingers pointed straight up. Make sure to keep your wrist straight during the exercise. 2. Make a circle by touching the tip of your thumb to the tip of your index finger. 3. Hold for __________ seconds. Then open your hand wide. 4. Repeat this motion with your thumb and each finger on your  hand. Repeat this exercise 5-10 times with each hand. Thumb motion 1. Sit with your forearm resting on a table and your wrist straight. Your thumb should be facing up toward the ceiling. Keep your fingers relaxed as you move your thumb. 2. Lift your thumb up as high as you can toward the ceiling. Hold for __________ seconds. 3. Bend your thumb across your palm as far as you can, reaching the tip of your thumb for the small finger (pinkie) side of your palm. Hold for __________ seconds. Repeat this exercise 5-10 times with each hand. Grip strengthening  1. Hold a stress ball or other soft ball in the middle of your hand. 2. Slowly increase the pressure, squeezing the ball as much as you can without causing pain. Think of bringing the tips of your fingers into the middle of your palm. All of your finger joints should bend when doing this exercise. 3. Hold your squeeze for __________ seconds, then relax. Repeat this exercise 5-10 times with each hand. Contact a health care provider if:  Your hand pain or discomfort gets much worse when you do an exercise.  Your hand pain or discomfort does not improve within 2 hours after you exercise. If you have any of these problems, stop doing these exercises right away. Do not do them again unless your health care provider says that you can. Get help right away if:  You develop sudden, severe hand pain or swelling. If this happens, stop doing these exercises right away. Do not do them again unless your health care provider says that you can. This information is not intended to replace advice given to you by your health care provider. Make sure you discuss any questions you have with your health care provider. Document Revised: 01/19/2019 Document Reviewed: 09/29/2018 [employer] Patient Education  [place].  Elbow and Forearm Exercises Ask your health care provider which exercises are safe for you. Do exercises exactly as told by your health care  provider and adjust them as directed. It is normal to feel mild stretching, pulling, tightness, or discomfort as you do these exercises. Stop right away if you feel sudden pain or your pain gets worse. Do not begin these exercises until told by your health care provider. Range-of-motion exercises These exercises warm up your muscles and joints and improve the movement and flexibility of your injured elbow and forearm. The exercises also help to relieve pain, numbness, and tingling. These exercises are done using the muscles in your injured elbow and forearm (active). Elbow flexion, active 1. Hold your left / right arm at your side, and bend your elbow (flexion) as far as you can using only your arm muscles. 2. Hold this position for __________ seconds. 3. Slowly return to the starting position. Repeat __________ times. Complete this exercise __________ times a day. Elbow extension, active 1. Hold your left / right arm at your side, and straighten your elbow (extension) as much as you can using only your arm muscles. 2. Hold this position for __________ seconds. 3. Slowly return to the starting position. Repeat __________ times. Complete  this exercise __________ times a day. Active forearm rotation, supination This is an exercise in which you turn (rotate) your forearm palm up (supination). 1. Stand or sit with your elbows at your sides. 2. Bend your left / right elbow to a 90-degree angle (right angle). 3. Rotate your palm up until you feel a gentle stretch on the inside of your forearm. 4. Hold this position for __________ seconds. 5. Slowly return to the starting position. Repeat __________ times. Complete this exercise __________ times a day. Active forearm rotation, pronation This is an exercise in which you turn (rotate) your forearm palm down (pronation). 1. Stand or sit with your elbows at your sides. 2. Bend your left / right elbow to a 90-degree angle (right angle). 3. Rotate your  palm down until you feel a gentle stretch on the top of your forearm. 4. Hold this position for __________ seconds. 5. Slowly return to the starting position. Repeat __________ times. Complete this exercise __________ times a day. Stretching exercises These exercises warm up your muscles and joints and improve the movement and flexibility of your injured elbow and forearm. These exercises also help to relieve pain, numbness, and tingling. These exercises are done using your healthy elbow and forearm to help stretch the muscles in your injured elbow and forearm (active-assisted). Elbow flexion, active-assisted  1. Hold your left / right arm at your side, and bend your elbow (flexion) as much as you can using your left / right arm muscles. 2. Use your other hand to bend your left / right elbow farther. To do this, gently push up on your forearm until you feel a gentle stretch on the back of your elbow. 3. Hold this position for __________ seconds. 4. Slowly return to the starting position. Repeat __________ times. Complete this exercise __________ times a day. Elbow extension, active-assisted  1. Hold your left / right arm at your side, and straighten your elbow (extension) as much as you can using your left / right arm muscles. 2. Use your other hand to straighten the left / right elbow farther. To do this, gently push down on your forearm until you feel a gentle stretch on the inside of your elbow. 3. Hold this position for __________ seconds. 4. Slowly return to the starting position. Repeat __________ times. Complete this exercise __________ times a day. Active-assisted forearm rotation, supination This is an exercise in which you turn (rotate) your forearm palm up (supination). 1. Sit with your left / right elbow bent in a 90-degree angle (right angle) with your forearm resting on a table. 2. Keeping your upper body and shoulder still, rotate your forearm so your palm faces upward. 3. Use  your other hand to help rotate your forearm further until you feel a gentle to moderate stretch. 4. Hold this position for __________ seconds. 5. Slowly release the stretch and return to the starting position. Repeat __________ times. Complete this exercise __________ times a day. Active-assisted forearm rotation, pronation This is an exercise in which you turn (rotate) your forearm palm down (pronation). 1. Sit with your left / right elbow bent in a 90-degree angle (right angle) with your forearm resting on a table. 2. Keeping your upper body and shoulder still, rotate your forearm so your palm faces the tabletop. 3. Use your other hand to help rotate your forearm further until you feel a gentle to moderate stretch. 4. Hold this position for __________ seconds. 5. Slowly release the stretch and return to the starting  position. Repeat __________ times. Complete this exercise __________ times a day. Passive elbow flexion, supine 1. Lie on your back (supine position). 2. Extend your left / right arm up in the air, bracing it with your other hand. 3. Let your left / right hand slowly lower toward your shoulder (passive flexion), while your elbow stays pointed toward the ceiling. You should feel a gentle stretch along the back of your upper arm and elbow. 4. If instructed by your health care provider, you may increase the intensity of your stretch by adding a small wrist weight or hand weight. 5. Hold this position for __________ seconds. 6. Slowly return to the starting position. Repeat __________ times. Complete this exercise __________ times a day. Passive elbow extension, supine  1. Lie on your back (supine position). Make sure that you are in a comfortable position that lets you relax your arm muscles. 2. Place a folded towel under your left / right upper arm so your elbow and shoulder are at the same height. Straighten your left / right arm so your elbow does not rest on the bed or  towel. 3. Let the weight of your hand stretch your elbow (passive extension). Keep your arm and chest muscles relaxed. You should feel a stretch on the inside of your elbow. 4. If told by your health care provider, you may increase the intensity of your stretch by adding a small wrist weight or hand weight. 5. Hold this position for __________ seconds. 6. Slowly release the stretch. Repeat __________ times. Complete this exercise __________ times a day. Strengthening exercises These exercises build strength and endurance in your elbow and forearm. Endurance is the ability to use your muscles for a long time, even after they get tired. Elbow flexion, isometric  1. Stand or sit up straight. 2. Bend your left / right elbow in a 90-degree angle (right angle), and keep your forearm at the height of your waist. Your thumb should be pointed toward the ceiling (neutral forearm). 3. Place your other hand on top of your left / right forearm. Gently push down while you resist with your left / right arm (isometric flexion). Push as hard as you can with both arms without causing any pain or movement at your left / right elbow. 4. Hold this position for __________ seconds. 5. Slowly release the tension in both arms. Let your muscles relax completely before you repeat the exercise. Repeat __________ times. Complete this exercise __________ times a day. Elbow extension, isometric  1. Stand or sit up straight. 2. Place your left / right arm so your palm faces your abdomen and is at the height of your waist. 3. Place your other hand on the underside of your left / right forearm. Gently push up while you resist with your left / right arm (isometric extension). Push as hard as you can with both arms without causing any pain or movement at your left / right elbow. 4. Hold this position for __________ seconds. 5. Slowly release the tension in both arms. Let your muscles relax completely before you repeat the  exercise. Repeat __________ times. Complete this exercise __________ times a day. Elbow flexion with forearm palm up  1. Sit on a firm chair without armrests, or stand up. 2. Place your left / right arm at your side with your elbow straight and your palm facing forward. 3. Holding a __________weight or gripping a rubber exercise band or tubing, bend your elbow to bring your hand toward your  shoulder (flexion). 4. Hold this position for __________ seconds. 5. Slowly return to the starting position. Repeat __________ times. Complete this exercise __________ times a day. Elbow extension, active  1. Sit on a firm chair without armrests, or stand up. 2. Hold a rubber exercise band or tubing in both hands. 3. Keeping your upper arms at your sides, bring both hands up to your left / right shoulder. Keep your left / right hand just below your other hand. 4. Straighten your left / right elbow (extension) while keeping your other arm still. 5. Hold this position for __________ seconds. 6. Control the resistance of the band or tubing as you return to the starting position. Repeat __________ times. Complete this exercise __________ times a day. Forearm rotation, supination  1. Sit with your left / right forearm supported on a table. Your elbow should be at waist height and bent at a 90-degree angle (right angle). 2. Gently grasp a lightweight hammer. 3. Rest your hand over the edge of the table with your palm facing down. 4. Without moving your left / right elbow, slowly rotate your forearm to turn your palm up toward the ceiling (supination). 5. Hold this position for __________ seconds. 6. Slowly return to the starting position. Repeat __________ times. Complete this exercise __________ times a day. Forearm rotation, pronation  1. Sit with your left / right forearm supported on a table. Keep your elbow below shoulder height. 2. Gently grasp a lightweight hammer. 3. Rest your hand over the edge  of the table with your palm facing up. 4. Without moving your left / right elbow, slowly rotate your forearm to turn your palm down toward the floor (pronation). 5. Hold this position for __________seconds. 6. Slowly return to the starting position. Repeat __________ times. Complete this exercise __________ times a day. This information is not intended to replace advice given to you by your health care provider. Make sure you discuss any questions you have with your health care provider. Document Revised: 01/19/2019 Document Reviewed: 10/19/2018 Elsevier Patient Education  [place].

## 2020-02-22 NOTE — Telephone Encounter (Signed)
Pt reported that she is experiencing severe abdominal pain with bms.  Please advise.
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
## 2020-02-26 NOTE — Telephone Encounter (Signed)
Phone line busy.

## 2020-02-27 NOTE — Telephone Encounter (Signed)
No return call from the patient 

## 2020-02-29 ENCOUNTER — Other Ambulatory Visit: Payer: Self-pay | Admitting: Rheumatology

## 2020-02-29 NOTE — Telephone Encounter (Signed)
Last Visit: 02/22/2020 Next Visit: 08/20/2020 Labs: 09/20/2019 Vitamin D is low. Rest the labs are stable.  11/21/2019 CBC WNL   Okay to refill per Dr. Estanislado Pandy.

## 2020-03-06 ENCOUNTER — Other Ambulatory Visit: Payer: Self-pay | Admitting: Family Medicine

## 2020-03-06 NOTE — Telephone Encounter (Signed)
Last office visit 11/21/2019 for CPE.  Last Lipid 11/09/2018.  Ok to refill?

## 2020-03-16 ENCOUNTER — Other Ambulatory Visit: Payer: Self-pay | Admitting: Allergy & Immunology

## 2020-03-16 ENCOUNTER — Other Ambulatory Visit: Payer: Self-pay | Admitting: Family Medicine

## 2020-03-16 DIAGNOSIS — B379 Candidiasis, unspecified: Secondary | ICD-10-CM

## 2020-03-18 NOTE — Telephone Encounter (Signed)
Last office visit 11/21/2019 for CPE.  Last refilled 11/15/2018 for 60 g with 3 refills.  CPE scheduled 11/22/2020.

## 2020-03-19 ENCOUNTER — Ambulatory Visit (INDEPENDENT_AMBULATORY_CARE_PROVIDER_SITE_OTHER): Payer: Medicare Other

## 2020-03-19 ENCOUNTER — Other Ambulatory Visit: Payer: Self-pay

## 2020-03-19 DIAGNOSIS — T63441D Toxic effect of venom of bees, accidental (unintentional), subsequent encounter: Secondary | ICD-10-CM | POA: Diagnosis not present

## 2020-03-26 ENCOUNTER — Other Ambulatory Visit: Payer: Self-pay | Admitting: Podiatry

## 2020-03-30 ENCOUNTER — Other Ambulatory Visit: Payer: Self-pay | Admitting: Allergy & Immunology

## 2020-04-04 ENCOUNTER — Other Ambulatory Visit: Payer: Self-pay

## 2020-04-04 NOTE — Patient Outreach (Signed)
Aging Gracefully Program  04/04/2020  Sherri Murray 1941-08-04 [id number]   [facility] Evaluation Interviewer made contact with patient. Aging Gracefully 2nd survey completed.   Interviewer will send follow up to Sharol Given with [facility] to mail out gift card.   [place] Management Assistant [phone]

## 2020-04-05 ENCOUNTER — Other Ambulatory Visit: Payer: Self-pay | Admitting: Family Medicine

## 2020-04-05 DIAGNOSIS — Z1231 Encounter for screening mammogram for malignant neoplasm of breast: Secondary | ICD-10-CM

## 2020-04-16 ENCOUNTER — Ambulatory Visit: Payer: Self-pay

## 2020-04-18 ENCOUNTER — Other Ambulatory Visit: Payer: Self-pay | Admitting: Family Medicine

## 2020-04-19 NOTE — Telephone Encounter (Signed)
Last office visit 11/21/19 Last refill 01/04/20 #180/1 See note from pharmacy, patient knocked pills into wet sink Needs new script

## 2020-04-23 ENCOUNTER — Ambulatory Visit: Payer: Medicare Other

## 2020-04-23 ENCOUNTER — Ambulatory Visit: Payer: Medicare Other | Admitting: Allergy & Immunology

## 2020-05-14 ENCOUNTER — Ambulatory Visit: Payer: Medicare Other | Admitting: Podiatry

## 2020-05-14 ENCOUNTER — Other Ambulatory Visit: Payer: Self-pay | Admitting: Allergy & Immunology

## 2020-05-16 ENCOUNTER — Ambulatory Visit: Payer: Medicare Other | Admitting: Allergy & Immunology

## 2020-05-21 ENCOUNTER — Other Ambulatory Visit: Payer: Self-pay | Admitting: Rheumatology

## 2020-05-21 NOTE — Telephone Encounter (Signed)
Please advise patient to get labs for future refills.

## 2020-05-21 NOTE — Telephone Encounter (Addendum)
Last Visit: 02/22/2020 Next Visit: 08/20/2020 Labs: 11/21/2019 CBC WNL, CMP 09/19/2020 Glucose 100  Attempted to contact the patient and unable to leave a message. No voicemail.   Okay to refill 30 day supply Fosamax?

## 2020-05-21 NOTE — Telephone Encounter (Signed)
Attempted to contact the patient and unable to leave a message. No voicemail.

## 2020-05-24 ENCOUNTER — Telehealth: Payer: Self-pay | Admitting: *Deleted

## 2020-05-24 ENCOUNTER — Ambulatory Visit: Payer: Self-pay | Admitting: *Deleted

## 2020-05-24 NOTE — Telephone Encounter (Signed)
Patient is at maintenance with her Mixed Vespid venom injection at every 4 weeks. Her last injection was 03/19/2020, she was due to return on July 6th 2021 for her next injection but due to a relative passing away she has been in [place]. She has been scheduled to get her injection August 27th for her next injectino. Please advise if we need to split dose and have her wait in office or if we need to step down dose. Thank You.

## 2020-05-24 NOTE — Telephone Encounter (Signed)
Noted  

## 2020-05-24 NOTE — Telephone Encounter (Signed)
We can just split it and keep her in the office.   Salvatore Marvel, MD [facility]

## 2020-05-27 ENCOUNTER — Other Ambulatory Visit: Payer: Self-pay | Admitting: Allergy & Immunology

## 2020-06-05 ENCOUNTER — Other Ambulatory Visit: Payer: Self-pay | Admitting: Rheumatology

## 2020-06-07 ENCOUNTER — Other Ambulatory Visit: Payer: Self-pay

## 2020-06-07 ENCOUNTER — Ambulatory Visit (INDEPENDENT_AMBULATORY_CARE_PROVIDER_SITE_OTHER): Payer: Medicare Other | Admitting: *Deleted

## 2020-06-07 ENCOUNTER — Other Ambulatory Visit: Payer: Self-pay | Admitting: Family Medicine

## 2020-06-07 ENCOUNTER — Other Ambulatory Visit: Payer: Self-pay | Admitting: Allergy & Immunology

## 2020-06-07 DIAGNOSIS — T63441D Toxic effect of venom of bees, accidental (unintentional), subsequent encounter: Secondary | ICD-10-CM

## 2020-06-07 NOTE — Telephone Encounter (Signed)
Last office visit 11/21/2019 for CPE.  Last refilled dicyclomine 01/03/2020 for #360 with no refills.  Last Lipid 11/09/2018.  CPE scheduled for 11/22/2020.  Ok to refill?

## 2020-06-24 ENCOUNTER — Ambulatory Visit: Payer: Medicare Other | Admitting: Internal Medicine

## 2020-06-26 ENCOUNTER — Ambulatory Visit: Payer: Medicare Other

## 2020-06-27 ENCOUNTER — Ambulatory Visit: Payer: Medicare Other | Admitting: Allergy & Immunology

## 2020-07-05 ENCOUNTER — Other Ambulatory Visit: Payer: Self-pay | Admitting: Allergy & Immunology

## 2020-07-05 ENCOUNTER — Telehealth: Payer: Self-pay | Admitting: Family Medicine

## 2020-07-05 ENCOUNTER — Ambulatory Visit: Payer: Self-pay

## 2020-07-05 NOTE — Telephone Encounter (Signed)
Pt needs to be tested ASAP.Marland Kitchen recommend PCR test. Please given her info on how make an appointment for COVID testing ONLINE: [email or web address]  Isolate until test result back.. can make virtual visit with me today if needed for additional symptom care. Can add on at end of day if need be. Otherwise rest, fluids and symptomatic care until test result back.

## 2020-07-05 NOTE — Telephone Encounter (Signed)
Patient notified as instructed by telephone and verbalized understanding. Patient was given information on [facility] testing and how to schedule an appointment. Patient stated that she has a slight headache and body aches, but this is normal for her. Patient denies a fever, SOB or difficulty breathing. Patient stated that she does not feel that she needs a visit at this time. Patient was given ER precautions and she verbalized understanding.

## 2020-07-05 NOTE — Telephone Encounter (Signed)
Pt called stating her daughter that lives with her was dx with covid 9/22.  Pt is wanted to know what she need to do.  Quarantine?? I let her know she need to quarantine until she hears back from PCP.    Pt is  Achy, headache in back of head  Pt also wanted to know does she need to be tested and /or get the  vaccine

## 2020-07-06 DIAGNOSIS — Z20822 Contact with and (suspected) exposure to covid-19: Secondary | ICD-10-CM | POA: Diagnosis not present

## 2020-07-09 ENCOUNTER — Telehealth: Payer: Self-pay | Admitting: *Deleted

## 2020-07-09 ENCOUNTER — Other Ambulatory Visit: Payer: Self-pay | Admitting: Primary Care

## 2020-07-09 DIAGNOSIS — U071 COVID-19: Secondary | ICD-10-CM

## 2020-07-09 NOTE — Progress Notes (Signed)
I connected by phone with Sherri Murray on 07/09/2020 at 4:41 PM to discuss the potential use of a new treatment for mild to moderate COVID-19 viral infection in non-hospitalized patients.  This patient is a 79 y.o. female that meets the FDA criteria for Emergency Use Authorization of COVID monoclonal antibody casirivimab/imdevimab or bamlanivimab/eteseviamb.  Has a (+) direct SARS-CoV-2 viral test result  Has mild or moderate COVID-19   Is NOT hospitalized due to COVID-19  Is within 10 days of symptom onset  Has at least one of the high risk factor(s) for progression to severe COVID-19 and/or hospitalization as defined in EUA.  Specific high risk criteria : Older age (>/= 80 yo) and Cardiovascular disease or hypertension   I have spoken and communicated the following to the patient or parent/caregiver regarding COVID monoclonal antibody treatment:  1. FDA has authorized the emergency use for the treatment of mild to moderate COVID-19 in adults and pediatric patients with positive results of direct SARS-CoV-2 viral testing who are 20 years of age and older weighing at least 40 kg, and who are at high risk for progressing to severe COVID-19 and/or hospitalization.  2. The significant known and potential risks and benefits of COVID monoclonal antibody, and the extent to which such potential risks and benefits are unknown.  3. Information on available alternative treatments and the risks and benefits of those alternatives, including clinical trials.  4. Patients treated with COVID monoclonal antibody should continue to self-isolate and use infection control measures (e.g., wear mask, isolate, social distance, avoid sharing personal items, clean and disinfect "high touch" surfaces, and frequent handwashing) according to CDC guidelines.   5. The patient or parent/caregiver has the option to accept or refuse COVID monoclonal antibody treatment.  After reviewing this information with  the patient, the patient has agreed to receive one of the available covid 19 monoclonal antibodies and will be provided an appropriate fact sheet prior to infusion. Pleas Koch, NP 07/09/2020 4:41 PM

## 2020-07-09 NOTE — Telephone Encounter (Signed)
Spoke with patient, she clearly qualifies for monoclonal antibody treatment. She was scheduled for 5:30 PM on 07/10/2020. Detailed information provided to patient.  She will bring a copy of her test results.

## 2020-07-09 NOTE — Telephone Encounter (Signed)
Pt left a VM on triage line. Pt did a covid test at [facility] (positive in care everywhere) due to exposure and not feeling good, she said she has missed several calls from them so not sure of results yet but pt is sure she has covid due to other family also testing positive. Pt said she doesn't feel good and has the "clasic" covid sxs and wants to get a covid infusion. Pt said she is stable but wants to proceed with getting covid infusion.  Routing to PCP and Anda Kraft

## 2020-07-09 NOTE — Telephone Encounter (Signed)
Will send note to [facility] with info for infusion .

## 2020-07-10 ENCOUNTER — Ambulatory Visit (HOSPITAL_COMMUNITY)
Admission: RE | Admit: 2020-07-10 | Discharge: 2020-07-10 | Disposition: A | Discharge status: Home / Self Care | Payer: Medicare Other | Source: Ambulatory Visit | Attending: Pulmonary Disease | Admitting: Pulmonary Disease

## 2020-07-10 ENCOUNTER — Other Ambulatory Visit: Payer: Self-pay | Admitting: Allergy & Immunology

## 2020-07-10 DIAGNOSIS — U071 COVID-19: Secondary | ICD-10-CM | POA: Diagnosis present

## 2020-07-10 DIAGNOSIS — Z23 Encounter for immunization: Secondary | ICD-10-CM | POA: Insufficient documentation

## 2020-07-10 MED ORDER — EPINEPHRINE 0.3 MG/0.3ML IJ SOAJ: 0.3000 mg | Freq: Once | INTRAMUSCULAR | Status: DC | PRN

## 2020-07-10 MED ORDER — SODIUM CHLORIDE 0.9 % IV SOLN
1200.0000 mg | Freq: Once | INTRAVENOUS | Status: AC
  Administered 2020-07-10: 1200 mg via INTRAVENOUS

## 2020-07-10 MED ORDER — METHYLPREDNISOLONE SODIUM SUCC 125 MG IJ SOLR: 125.0000 mg | Freq: Once | INTRAMUSCULAR | Status: DC | PRN

## 2020-07-10 MED ORDER — ALBUTEROL SULFATE HFA 108 (90 BASE) MCG/ACT IN AERS: 2.0000 | INHALATION_SPRAY | Freq: Once | RESPIRATORY_TRACT | Status: DC | PRN

## 2020-07-10 MED ORDER — DIPHENHYDRAMINE HCL 50 MG/ML IJ SOLN: 50.0000 mg | Freq: Once | INTRAMUSCULAR | Status: DC | PRN

## 2020-07-10 MED ORDER — FAMOTIDINE IN NACL 20-0.9 MG/50ML-% IV SOLN: 20.0000 mg | Freq: Once | INTRAVENOUS | Status: DC | PRN

## 2020-07-10 MED ORDER — SODIUM CHLORIDE 0.9 % IV SOLN: INTRAVENOUS | Status: DC | PRN

## 2020-07-10 NOTE — Progress Notes (Signed)
  Diagnosis: COVID-19  Physician: Dr. Joya Gaskins  Procedure: Covid Infusion Clinic Med: casirivimab\imdevimab infusion - Provided patient with casirivimab\imdevimab fact sheet for patients, parents and caregivers prior to infusion.  Complications: No immediate complications noted.  Discharge: Discharged home   [place] 07/10/2020

## 2020-07-10 NOTE — Discharge Instructions (Signed)

## 2020-07-11 ENCOUNTER — Ambulatory Visit: Payer: Medicare Other

## 2020-07-17 ENCOUNTER — Other Ambulatory Visit: Payer: Self-pay | Admitting: Allergy & Immunology

## 2020-07-21 ENCOUNTER — Other Ambulatory Visit: Payer: Self-pay | Admitting: Podiatry

## 2020-07-24 ENCOUNTER — Other Ambulatory Visit: Payer: Self-pay | Admitting: Allergy & Immunology
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
## 2020-08-07 ENCOUNTER — Other Ambulatory Visit: Payer: Self-pay

## 2020-08-07 NOTE — Patient Outreach (Signed)
Aging Gracefully Program  08/07/2020  Sherri Murray [birth year] [id number]  Called patient to schedule 9 months AG evaluation survey phone appointment but was not able to reach patient on her phone number or leave a message. I did get in touch with her grandson and left message to call me back with him.  [place] Management Assistant

## 2020-08-14 ENCOUNTER — Other Ambulatory Visit: Payer: Self-pay

## 2020-08-14 NOTE — Patient Outreach (Signed)
Aging Gracefully Program  08/14/2020  Shuntia Exton 1941/04/11 [id number]  Completed AG evaluation survey 9 months follow up after completing [place] Management Assistant

## 2020-08-17 ENCOUNTER — Other Ambulatory Visit: Payer: Self-pay | Admitting: Allergy & Immunology

## 2020-08-20 ENCOUNTER — Other Ambulatory Visit: Payer: Self-pay

## 2020-08-20 ENCOUNTER — Encounter: Payer: Self-pay | Admitting: Physician Assistant

## 2020-08-20 ENCOUNTER — Ambulatory Visit (INDEPENDENT_AMBULATORY_CARE_PROVIDER_SITE_OTHER): Payer: Medicare Other | Admitting: Physician Assistant

## 2020-08-20 VITALS — BP 140/82 | HR 72 | Resp 13 | Ht 63.0 in | Wt 153.8 lb

## 2020-08-20 DIAGNOSIS — Z96611 Presence of right artificial shoulder joint: Secondary | ICD-10-CM | POA: Diagnosis not present

## 2020-08-20 DIAGNOSIS — M810 Age-related osteoporosis without current pathological fracture: Secondary | ICD-10-CM

## 2020-08-20 DIAGNOSIS — M797 Fibromyalgia: Secondary | ICD-10-CM | POA: Diagnosis not present

## 2020-08-20 DIAGNOSIS — R7303 Prediabetes: Secondary | ICD-10-CM

## 2020-08-20 DIAGNOSIS — I10 Essential (primary) hypertension: Secondary | ICD-10-CM

## 2020-08-20 DIAGNOSIS — M5136 Other intervertebral disc degeneration, lumbar region: Secondary | ICD-10-CM | POA: Diagnosis not present

## 2020-08-20 DIAGNOSIS — M19041 Primary osteoarthritis, right hand: Secondary | ICD-10-CM | POA: Diagnosis not present

## 2020-08-20 DIAGNOSIS — M19042 Primary osteoarthritis, left hand: Secondary | ICD-10-CM

## 2020-08-20 DIAGNOSIS — Z96651 Presence of right artificial knee joint: Secondary | ICD-10-CM | POA: Diagnosis not present

## 2020-08-20 DIAGNOSIS — R2681 Unsteadiness on feet: Secondary | ICD-10-CM

## 2020-08-20 DIAGNOSIS — I5189 Other ill-defined heart diseases: Secondary | ICD-10-CM

## 2020-08-20 DIAGNOSIS — M51369 Other intervertebral disc degeneration, lumbar region without mention of lumbar back pain or lower extremity pain: Secondary | ICD-10-CM

## 2020-09-04 ENCOUNTER — Other Ambulatory Visit: Payer: Self-pay | Admitting: Family Medicine

## 2020-09-13 ENCOUNTER — Telehealth: Payer: Self-pay | Admitting: *Deleted

## 2020-09-13 NOTE — Telephone Encounter (Signed)
Patient called stating that she wants to restart her venom injection. Her last injection was 06/07/2020 and she received 63mL of MV maintenance every 4 weeks. At that time she had received a split dose due to being late with her injection as well. She would like to know where she should re-start with her injection. She has an upcoming appointment with you in regards to an allergic reaction and she thought maybe you both could talk about it then but she wanted you to know about it.

## 2020-09-17 ENCOUNTER — Encounter: Payer: Self-pay | Admitting: Allergy & Immunology

## 2020-09-17 ENCOUNTER — Ambulatory Visit (INDEPENDENT_AMBULATORY_CARE_PROVIDER_SITE_OTHER): Payer: Medicare Other | Admitting: Allergy & Immunology

## 2020-09-17 ENCOUNTER — Other Ambulatory Visit: Payer: Self-pay

## 2020-09-17 VITALS — BP 138/80 | HR 56 | Temp 98.4°F | Resp 18 | Ht 63.0 in | Wt 156.6 lb

## 2020-09-17 DIAGNOSIS — T63441D Toxic effect of venom of bees, accidental (unintentional), subsequent encounter: Secondary | ICD-10-CM | POA: Diagnosis not present

## 2020-09-17 DIAGNOSIS — J4520 Mild intermittent asthma, uncomplicated: Secondary | ICD-10-CM

## 2020-09-17 DIAGNOSIS — H6121 Impacted cerumen, right ear: Secondary | ICD-10-CM | POA: Diagnosis not present

## 2020-09-17 DIAGNOSIS — R1310 Dysphagia, unspecified: Secondary | ICD-10-CM | POA: Diagnosis not present

## 2020-09-17 DIAGNOSIS — J310 Chronic rhinitis: Secondary | ICD-10-CM | POA: Diagnosis not present

## 2020-09-17 NOTE — Progress Notes (Signed)
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 Mild intermittent asthma - Lung testing looked great today. - We are not going to make any medication changes at this time.  - Continue montelukast 10mg  daily.   - Continue with ProAir 2 puffs every 4-6 hours as needed.   2. Adverse food reactions (multiple foods) - Continue to avoid all of your foods.  - EpiPen refilled today.      3. Chronic rhinitis - Continue with nasal saline as needed.  - Continue with ipratropium nasal spray one spray per nostril up to four times today (THIS WILL DRY OUT YOUR NOSE). - Continue with loratadine (Claritin) 10 mg twice daily.   - We are referring you to ENT so that they can check down your throat.  - Give them a call to schedule - [phone] (Dr. Lucia Gaskins)  4. 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   - We are going to restart your injections at a lower dose and increase weekly until you are back on maintenance.  - Let's restart her at 0.05 of her full strength MV and advance weekly until she reaches 1 mL.  - Once at 1 mL, we can space back out to every 4 weeks.   5. Concern for COVID19 vaccine reaction - We have you scheduled for COVID vaccine component testing on June 23rd at 1:30pm. - Stop your Claritin on December 19th.   6. Return in about 16 days (around 10/03/2020).    Subjective:   Sherri Murray is a 79 y.o. female presenting today for follow up of  Chief Complaint    Patient presents with  . Asthma  . Food Intolerance  . Allergic Reaction    Sherri Murray reaction causing rash    Sherri Murray has a history of the following: Patient Active Problem List   Diagnosis Date Noted  . Multiple food allergies 07/27/2018  . Gastrointestinal intolerance to foods 07/27/2018  . H/O total shoulder replacement, right 05/03/2017  . Secondary osteoarthritis of shoulder, right 02/05/2017  . DJD of right shoulder 02/05/2017  . Fibromyalgia 01/27/2017  . Primary osteoarthritis of both hands 01/27/2017  . Post-traumatic osteoarthritis of right shoulder 12/23/2016  . PAC (premature atrial contraction) 12/08/2016  . Adhesive capsulitis of right shoulder 08/18/2016  . External hemorrhoids without complication 09/98/3382  . Osteoarthritis of right knee 12/25/2015  . H/O total knee replacement, right 12/11/2015  . Advanced directives, counseling/discussion 10/18/2015  . Total body pain 08/13/2015  . Hearing loss due to cerumen impaction, right 07/05/2015  . Diastolic dysfunction 50/53/9767  . Mild mitral regurgitation 12/20/2014  . Benign essential HTN 10/19/2014  . High cholesterol 05/01/2014  . Prediabetes 05/01/2014  . Allergic rhinitis 09/28/2013  . Mild intermittent asthma 09/28/2013  . Ischemic colitis, hx of 09/28/2013  . Hx of migraines 09/28/2013  . GERD (gastroesophageal reflux disease) 09/28/2013  . 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 Varicose veins of lower extremities with other complications 34/19/3790  . Osteoporosis screening 04/21/2013  . OAB (overactive bladder)   . IBS (  irritable bowel syndrome) 02/27/2012  . Generalized abdominal pain 02/26/2012    History obtained from: chart review and patient.  Sherri Murray is a 79 y.o. female presenting for a follow up visit.  She was last seen in January 2021.  At that time, we continue with montelukast and ProAir for her asthma.  She has a history of multiple food allergies and we  recommended continued avoidance.  Her EpiPen was refilled.  For her rhinitis, we continue with ipratropium as needed for rhinorrhea.  We continue with her mixed vespid immunotherapy monthly.  We did recommend that she get COVID vaccine component testing since she was so nervous about getting the COVID-19 vaccine.   However, it does not seem that this was done.    Since the last visit, she has mostly done well. On 04/11/20, she contracted [place]. She was tested because her daughter was tested and she had it. She went to [facility] and got tested. Then she came back tested and she had a monoclonal antibody infusion.   She has been taking care of her son in law, who was stricken with ALS (he died at age 71). On Jul 31, 2023, her ex husband died from [place] (he lived in [place]). They have been good friends since the divorce. She was polite to his wife even though the rest of the family hated her. He ended up dying at the age of 39.   Sherri Murray's asthma has been well controlled. She has not required rescue medication, experienced nocturnal awakenings due to lower respiratory symptoms, nor have activities of daily living been limited. She has required no Emergency Department or Urgent Care visits for her asthma. She has required zero courses of systemic steroids for asthma exacerbations since the last visit. ACT score today is 25, indicating excellent asthma symptom control.   She also tells me that she thinks that she swallowed a candy wrapper. This was two weeks ago and she tells me that she can feel it when she swallows. This story is rather familiar and review of her records shows that we went through this same story in August 2018. We referred to ENT at that time and per the record, she refused to make an appointment on the Referral Order.   Otherwise, there have been no changes to her past medical history, surgical history, family history, or social history.    Review of Systems  Constitutional: Negative.   Negative for fever, malaise/fatigue and weight loss.  HENT: Negative.  Negative for congestion, ear discharge and ear pain.   Eyes: Negative for pain, discharge and redness.  Respiratory: Negative for cough, sputum production, shortness of breath and wheezing.   Cardiovascular: Negative.  Negative for chest pain and palpitations.  Gastrointestinal: Negative for abdominal pain, constipation, diarrhea, heartburn, nausea and vomiting.  Skin: Negative.  Negative for itching and rash.  Neurological: Negative for dizziness and headaches.  Endo/Heme/Allergies: Negative for environmental allergies. Does not bruise/bleed easily.       Objective:   Blood pressure 138/80, pulse (!) 56, temperature 98.4 F (36.9 C), temperature source Temporal, resp. rate 18, height 5\' 3"  (1.6 m), weight 156 lb 9.6 oz (71 kg), SpO2 97 %. Body mass index is 27.74 kg/m.   Physical Exam:  Physical Exam Constitutional:      Appearance: She is well-developed.     Comments: Pleasant talkative female.   HENT:     Head: Normocephalic and atraumatic.     Right Ear: Tympanic membrane normal. There is impacted cerumen.  Left Ear: Tympanic membrane normal. There is impacted cerumen.     Nose: No nasal deformity, septal deviation, mucosal edema or rhinorrhea.     Right Turbinates: Enlarged and swollen.     Left Turbinates: Enlarged and swollen.     Right Sinus: No maxillary sinus tenderness or frontal sinus tenderness.     Left Sinus: No maxillary sinus tenderness or frontal sinus tenderness.     Comments: There is a large amount of yellow mucous in the right nares.     Mouth/Throat:     Mouth: Mucous membranes are not pale and not dry.     Pharynx: Uvula midline.     Comments: No wrapper appreciated in the throat, although I could not see down very far.  Eyes:     General:        Right eye: No discharge.        Left eye: No discharge.     Conjunctiva/sclera: Conjunctivae normal.     Right eye: Right  conjunctiva is not injected. No chemosis.    Left eye: Left conjunctiva is not injected. No chemosis.    Pupils: Pupils are equal, round, and reactive to light.  Cardiovascular:     Rate and Rhythm: Normal rate and regular rhythm.     Heart sounds: Normal heart sounds.  Pulmonary:     Effort: Pulmonary effort is normal. No tachypnea, accessory muscle usage or respiratory distress.     Breath sounds: Normal breath sounds. No wheezing, rhonchi or rales.     Comments: Moving air well in all lung fields. No increased work of breathing noted.  Chest:     Chest wall: No tenderness.  Lymphadenopathy:     Cervical: No cervical adenopathy.  Skin:    Coloration: Skin is not pale.     Findings: No abrasion, erythema, petechiae or rash. Rash is not papular, urticarial or vesicular.  Neurological:     Mental Status: She is alert.  Psychiatric:        Behavior: Behavior is cooperative.      Diagnostic studies:    Spirometry: results abnormal (FEV1: 1.53/83%, FVC: 1.96/79%, FEV1/FVC: 78%).    Spirometry consistent with possible restrictive disease.   Allergy Studies: none      Salvatore Marvel, MD  [facility] [place]

## 2020-09-17 NOTE — Telephone Encounter (Signed)
Patient will schedule appointment.

## 2020-09-17 NOTE — Telephone Encounter (Signed)
Let's restart her at 0.05 of her full strength MV and advance weekly until she reaches 1 mL. Then we can space back out to every 4 weeks.   We can chat more today.   Salvatore Marvel, MD [facility] [place]

## 2020-09-17 NOTE — Patient Instructions (Addendum)
1. Mild intermittent asthma - Lung testing looked great today. - We are not going to make any medication changes at this time.  - Continue montelukast 10mg  daily.   - Continue with ProAir 2 puffs every 4-6 hours as needed.   2. Adverse food reactions (multiple foods) - Continue to avoid all of your foods.  - EpiPen refilled today.      3. Chronic rhinitis - Continue with nasal saline as needed.  - Continue with ipratropium nasal spray one spray per nostril up to four times today (THIS WILL DRY OUT YOUR NOSE). - Continue with loratadine (Claritin) 10 mg twice daily.   - We are referring you to ENT so that they can check down your throat.  - Give them a call to schedule - [phone] (Dr. Lucia Gaskins)  4. 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   - We are going to restart your injections at a lower dose and increase weekly until you are back on maintenance.   5. Concern for COVID19 vaccine reaction - We have you scheduled for COVID vaccine component testing on June 23rd at 1:30pm. - Stop your Claritin on December 19th.   6. Return in about 16 days (around 10/03/2020).     Please inform us of any Emergency Department visits, hospitalizations, or changes in symptoms. Call us before going to the ED for breathing or allergy symptoms since we might be able to fit you in for a sick visit. Feel free to contact us anytime with any questions, problems, or concerns.  It was a pleasure to see you again today!  Websites that have reliable patient information: 1. American Academy of Asthma, Allergy, and Immunology: www.aaaai.org 2. Food Allergy Research and Education (FARE): foodallergy.org 3. Mothers of Asthmatics: http://www.asthmacommunitynetwork.org 4. American College of Allergy, Asthma, and Immunology: www.acaai.org  "Like" [place] on Facebook and Instagram for our latest updates!        Make sure you are registered to vote! If you have moved or changed any  of your contact information, you will need to get this updated before voting!  In some cases, you MAY be able to register to vote online: [email or web address]

## 2020-09-24 ENCOUNTER — Ambulatory Visit (INDEPENDENT_AMBULATORY_CARE_PROVIDER_SITE_OTHER): Payer: Medicare Other | Admitting: Podiatry

## 2020-09-24 ENCOUNTER — Other Ambulatory Visit: Payer: Self-pay

## 2020-09-24 ENCOUNTER — Encounter: Payer: Self-pay | Admitting: Podiatry

## 2020-09-24 DIAGNOSIS — B351 Tinea unguium: Secondary | ICD-10-CM

## 2020-09-24 DIAGNOSIS — M7752 Other enthesopathy of left foot: Secondary | ICD-10-CM

## 2020-09-24 DIAGNOSIS — M79676 Pain in unspecified toe(s): Secondary | ICD-10-CM | POA: Diagnosis not present

## 2020-09-24 MED ORDER — BETAMETHASONE SOD PHOS & ACET 6 (3-3) MG/ML IJ SUSP
3.0000 mg | Freq: Once | INTRAMUSCULAR | Status: AC
  Administered 2020-09-24: 3 mg via INTRAMUSCULAR
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
## 2020-09-25 ENCOUNTER — Ambulatory Visit (INDEPENDENT_AMBULATORY_CARE_PROVIDER_SITE_OTHER): Payer: Medicare Other | Admitting: Otolaryngology

## 2020-09-25 ENCOUNTER — Encounter (INDEPENDENT_AMBULATORY_CARE_PROVIDER_SITE_OTHER): Payer: Self-pay | Admitting: Otolaryngology

## 2020-09-25 VITALS — Temp 97.9°F

## 2020-09-25 DIAGNOSIS — H6123 Impacted cerumen, bilateral: Secondary | ICD-10-CM

## 2020-09-25 DIAGNOSIS — R1314 Dysphagia, pharyngoesophageal phase: Secondary | ICD-10-CM
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
## 2020-10-02 DIAGNOSIS — M778 Other enthesopathies, not elsewhere classified: Secondary | ICD-10-CM | POA: Diagnosis not present

## 2020-10-02 DIAGNOSIS — M180 Bilateral primary osteoarthritis of first carpometacarpal joints: Secondary | ICD-10-CM | POA: Diagnosis not present

## 2020-10-02 DIAGNOSIS — R200 Anesthesia of skin: Secondary | ICD-10-CM | POA: Diagnosis not present

## 2020-10-02 DIAGNOSIS — R202 Paresthesia of skin: Secondary | ICD-10-CM | POA: Diagnosis not present

## 2020-10-03 ENCOUNTER — Ambulatory Visit (INDEPENDENT_AMBULATORY_CARE_PROVIDER_SITE_OTHER): Payer: Medicare Other | Admitting: Allergy & Immunology

## 2020-10-03 ENCOUNTER — Other Ambulatory Visit (INDEPENDENT_AMBULATORY_CARE_PROVIDER_SITE_OTHER): Payer: Medicare Other

## 2020-10-03 ENCOUNTER — Ambulatory Visit (INDEPENDENT_AMBULATORY_CARE_PROVIDER_SITE_OTHER): Payer: Medicare Other | Admitting: Physician Assistant

## 2020-10-03 ENCOUNTER — Encounter: Payer: Self-pay | Admitting: Physician Assistant

## 2020-10-03 ENCOUNTER — Encounter: Payer: Self-pay | Admitting: Allergy & Immunology

## 2020-10-03 ENCOUNTER — Other Ambulatory Visit: Payer: Self-pay

## 2020-10-03 VITALS — BP 128/80 | HR 54 | Ht 63.0 in | Wt 150.4 lb

## 2020-10-03 VITALS — BP 110/60 | HR 96 | Temp 98.2°F | Resp 14 | Ht 63.0 in | Wt 151.6 lb

## 2020-10-03 DIAGNOSIS — R159 Full incontinence of feces: Secondary | ICD-10-CM

## 2020-10-03 DIAGNOSIS — R194 Change in bowel habit: Secondary | ICD-10-CM | POA: Diagnosis not present

## 2020-10-03 DIAGNOSIS — R1084 Generalized abdominal pain: Secondary | ICD-10-CM

## 2020-10-03 DIAGNOSIS — K219 Gastro-esophageal reflux disease without esophagitis: Secondary | ICD-10-CM

## 2020-10-03 DIAGNOSIS — T50Z95D Adverse effect of other vaccines and biological substances, subsequent encounter: Secondary | ICD-10-CM

## 2020-10-03 DIAGNOSIS — R0789 Other chest pain: Secondary | ICD-10-CM

## 2020-10-03 DIAGNOSIS — N3941 Urge incontinence: Secondary | ICD-10-CM

## 2020-10-03 DIAGNOSIS — T50905D Adverse effect of unspecified drugs, medicaments and biological substances, subsequent encounter: Secondary | ICD-10-CM

## 2020-10-03 LAB — BUN: BUN: 15 mg/dL (ref 6–23)

## 2020-10-03 LAB — CREATININE, SERUM: Creatinine, Ser: 0.79 mg/dL (ref 0.40–1.20)

## 2020-10-03 MED ORDER — PREDNISONE 50 MG PO TABS: ORAL_TABLET | ORAL | 0 refills | Status: DC

## 2020-10-03 MED ORDER — DIPHENHYDRAMINE HCL 50 MG PO TABS: 50.0000 mg | ORAL_TABLET | Freq: Every evening | ORAL | 0 refills | Status: DC | PRN

## 2020-10-03 MED ORDER — PANTOPRAZOLE SODIUM 40 MG PO TBEC: 40.0000 mg | DELAYED_RELEASE_TABLET | Freq: Two times a day (BID) | ORAL | 2 refills | Status: DC

## 2020-10-03 NOTE — Patient Instructions (Addendum)
1. Concern for adverse reaction to [place] today was negative, including the challenge to the Miralax (PEG). - I think that you will tolerate the COVID vaccine well. - There is currently no definite skin testing available for the Pfizer and Moderna COVID vaccines and data is limited however, there has been a concern regarding sensitivity to PEG in patients who had anaphylactic reactions to the COVID-19 vaccine. - Skin testing was negative to Miralax (source of PEG 3350), methylprednisolone acetate (source of PEG 3350), and triamcinolone acetonide (source of polysorbate-80). - We made an appointment for you to get you COVID19 vaccine on January 10th at our office at 1:30pm.  2. Return in about 18 days (around 10/21/2020).   Please inform us of any Emergency Department visits, hospitalizations, or changes in symptoms. Call us before going to the ED for breathing or allergy symptoms since we might be able to fit you in for a sick visit. Feel free to contact us anytime with any questions, problems, or concerns.  It was a pleasure to see you again today! ENJOY THE HOLIDAYS!   Websites that have reliable patient information: 1. American Academy of Asthma, Allergy, and Immunology: www.aaaai.org 2. Food Allergy Research and Education (FARE): foodallergy.org 3. Mothers of Asthmatics: http://www.asthmacommunitynetwork.org 4. American College of Allergy, Asthma, and Immunology: www.acaai.org   COVID-19 Vaccine Information can be found at: ShippingScam.co.uk For questions related to vaccine distribution or appointments, please email vaccine@[email or web address] .com or call [phone].     "Like" [place] on Facebook and Instagram for our latest updates!          Make sure you are registered to vote! If you have moved or changed any of your contact information, you will need to get this updated before voting!  In some cases, you MAY be able  to register to vote online: [email or web address]
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
## 2020-10-03 NOTE — Progress Notes (Signed)
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 Concern for adverse reaction to Mallory today was negative, including the challenge to the Miralax (PEG). - I think that you will tolerate the COVID vaccine well. - There is currently no definite skin testing available for the Pfizer and Moderna COVID vaccines and data is limited however, there has been a concern regarding sensitivity to PEG in patients who had anaphylactic reactions to the COVID-19 vaccine. - Skin testing was negative to Miralax (source of PEG 3350), methylprednisolone acetate (source of PEG 3350), and triamcinolone acetonide (source of polysorbate-80). - We made an appointment for you to get you COVID19 vaccine on January 10th at our office at 1:30pm.  2. Return in about 18 days (around 10/21/2020).   Subjective:   Armilda Frampton is a 79 y.o. female presenting today for follow up of  Chief Complaint  Patient presents with  . Food/Drug Challenge    Covid component testing     Alexus Busbin has a history of the following: Patient Active Problem List   Diagnosis Date Noted  . Multiple food allergies 07/27/2018  . Gastrointestinal intolerance to foods 07/27/2018  . H/O total shoulder replacement, right 05/03/2017  . Secondary osteoarthritis of shoulder, right 02/05/2017  . DJD of right shoulder 02/05/2017  . Fibromyalgia 01/27/2017  . Primary osteoarthritis of both hands 01/27/2017  . Post-traumatic  osteoarthritis of right shoulder 12/23/2016  . PAC (premature atrial contraction) 12/08/2016  . Adhesive capsulitis of right shoulder 08/18/2016  . External hemorrhoids without complication [id number]  . Osteoarthritis of right knee 12/25/2015  . H/O total knee replacement, right 12/11/2015  . Advanced directives, counseling/discussion 10/18/2015  . Total body pain 08/13/2015  . Hearing loss due to cerumen impaction, right 07/05/2015  . Diastolic dysfunction 0000000  . Mild mitral regurgitation 12/20/2014  . Heart disease 12/20/2014  . Mitral valve disorder 12/20/2014  . Benign essential HTN 10/19/2014  . Candidiasis of skin and nails 10/19/2014  . High cholesterol 05/01/2014  . Prediabetes 05/01/2014  . Osteoarthritis 03/26/2014  . Allergic rhinitis 09/28/2013  . Mild intermittent asthma 09/28/2013  . Ischemic colitis, hx of 09/28/2013  . Hx of migraines 09/28/2013  . GERD (gastroesophageal reflux disease) 09/28/2013  . 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 Varicose veins of lower extremities with other complications [id number]  . Osteoporosis screening 04/21/2013  . OAB (overactive bladder)   . IBS (irritable bowel syndrome) 02/27/2012  . Generalized abdominal pain 02/26/2012    History obtained from: chart review and patient.  Fransisca is a 79 y.o. female presenting for skin testing.  Specifically, she is here for COVID component vaccine testing.  She expressed concern about getting the vaccine due to her history of fairly vague reactions to the flu vaccine.  She has received it on multiple occasions without a problem, but occasionally she would have some issues which did not seem IgE mediated to the vaccine.  She is very hesitant about getting the COVID-19 vaccine and would like to do the testing beforehand.  She has tolerated MiraLAX on a number  of occasions without any issues.  She did go see ENT.  The laryngoscopy showed no evidence of any candy  wrapper.  Otherwise, there have been no changes to her past medical history, surgical history, family history, or social history.    Review of Systems  Constitutional: Negative.  Negative for chills, fever, malaise/fatigue and weight loss.  HENT: Positive for congestion. Negative for ear discharge, ear pain and sinus pain.   Eyes: Negative for pain, discharge and redness.  Respiratory: Negative for cough, sputum production, shortness of breath and wheezing.   Cardiovascular: Negative.  Negative for chest pain and palpitations.  Gastrointestinal: Negative for abdominal pain, constipation, diarrhea, heartburn, nausea and vomiting.  Skin: Negative.  Negative for itching and rash.  Neurological: Negative for dizziness and headaches.  Endo/Heme/Allergies: Positive for environmental allergies. Does not bruise/bleed easily.       Objective:   Blood pressure 110/60, pulse 96, temperature 98.2 F (36.8 C), resp. rate 14, height 5\' 3"  (1.6 m), weight 151 lb 9.6 oz (68.8 kg), SpO2 97 %. Body mass index is 26.85 kg/m.   Physical Exam:  Physical Exam Constitutional:      Appearance: She is well-developed.     Comments: Talkative.  HENT:     Head: Normocephalic and atraumatic.     Right Ear: Tympanic membrane, ear canal and external ear normal.     Left Ear: Tympanic membrane, ear canal and external ear normal.     Nose: No nasal deformity, septal deviation, mucosal edema, rhinorrhea or epistaxis.     Right Turbinates: Enlarged.     Left Turbinates: Enlarged.     Right Sinus: No maxillary sinus tenderness or frontal sinus tenderness.     Left Sinus: No maxillary sinus tenderness or frontal sinus tenderness.     Mouth/Throat:     Mouth: Oropharynx is clear and moist. Mucous membranes are not pale and not dry.     Pharynx: Uvula midline.  Eyes:     General:        Right eye: No discharge.        Left eye: No discharge.     Extraocular Movements: EOM normal.     Conjunctiva/sclera:  Conjunctivae normal.     Right eye: Right conjunctiva is not injected. No chemosis.    Left eye: Left conjunctiva is not injected. No chemosis.    Pupils: Pupils are equal, round, and reactive to light.  Cardiovascular:     Rate and Rhythm: Normal rate and regular rhythm.     Heart sounds: Normal heart sounds.  Pulmonary:     Effort: Pulmonary effort is normal. No tachypnea, accessory muscle usage or respiratory distress.     Breath sounds: Normal breath sounds. No wheezing, rhonchi or rales.  Chest:     Chest wall: No tenderness.  Lymphadenopathy:     Cervical: No cervical adenopathy.  Skin:    Coloration: Skin is not pale.     Findings: No abrasion, erythema, petechiae or rash. Rash is not papular, urticarial or vesicular.  Neurological:     Mental Status: She is alert.  Psychiatric:        Mood and Affect: Mood and affect normal.        Behavior: Behavior is cooperative.      Diagnostic studies:   Allergy Studies:    COVID Vaccine Testing - 10/03/20 1650      Test Information   Consent Yes    Medications Miralax    Triamcinolone Lot #  [id number]    Triamcinolone EXP DATE 10/12/21    Methylprednisolone Lot # [id number]    Methylprednisolone EXP DATE 12/10/20    Miralax Lot # [id number]    Miralax EXP DATE 04/11/22      Pre Test Vitals   BP 110/60    Pulse 96    Resp 14      SKIN PRICK TESTING - Arm #1   Location Left Arm    Select Select      HISTAMINE (1mg /mL) Skin Prick Arm #1   Histamine Time Testing Placed 0226    Histamine Wheal 3+      Control (negative - HSA) Skin Prick Arm #1   Control Time Testing Placed 0226    Control Wheal Negative      Triamcinolone (40mg /mL) Skin Prick Arm #1   Triamcinolone Time Testing Placed 0226    Triamcinolone Wheal Negative      Methylprednisolone (40mg /mL) Skin Prick Arm #1   Methylprednisolone Time Testing Placed 0226    Methylprednisolone Wheal Negative      Miralax (1:100 or 1.7 mg/mL) Skin Prick Arm #1   Miralax  Time Testing Placed 0226    Miralax Wheal Negative      Miralax (1:10 or 17mg /mL) Skin Prick Arm #1   Miralax Time Testing Placed 0250    Miralax Wheal Negative      Miralax (1:1 or 170mg /mL) Skin Prick Arm #1   Miralax Time Testing Placed 0314    Miralax Wheal Negative      INTRADERMAL TESTING - Arm #2   Location Right Arm    Select Select      Control (negative - HSA) Intradermal Arm #2   Control Time Testing Placed  0250    Control Wheal Negative      Triamcinolone (1:100) Intradermal Arm #2   Triamcinolone Time Testing Placed 0250    Triamcinolone Wheal Negative      Methylprednisolone (1:100) Intradermal Arm #2   Methylprednisolone Time Testing Placed  0250    Methylprednisolone Wheal Negative      Triamcinolone (1:10) Intradermal Arm #2   Triamcinolone Time Testing Placed 0314    Triamcinolone Wheal Negative      Methylprednisolone (1:10) Intradermal Arm #2   Methylprednisolone Time Testing Placed  0314    Methylprednisolone Wheal Negative      Triamcinolone (1:1) Intradermal Arm #2   Triamcinolone Time Testing Placed 0336    Triamcinolone Wheal Negative      Skin Prick/Intradermal Post Testing   Skin Prick/Intradermal Testing Total Pricks 6      ORAL CHALLENGE TESTING   Select Select      Pre Challenge Vitals   BP 130/72    Pulse 53    Resp 17      Miralax 170mg /mL Suspension Oral Challenge 0.3 mL   Miralax 0.3 mL Time Given 0417      Miralax 170mg /mL Suspension Oral Challenge 3 mL   Miralax 3 mL Time Given 0433      Miralax 170mg /mL Suspension Oral Challenge 15 mL   Miralax 15 mL Time Given 0449      Post Test Vitals   BP 140/70    Pulse 49    Resp 18          SKIN TESTING (CPT 95018)  SKIN PRICK TESTING ARM #1 SIZE TIME  1. Histamine (1.8mg /mL) 2+ See scanned flowsheet  2. Control (negative - HSA) Negative See scanned flowsheet  3. Triamcinolone (40mg /mL) Negative See scanned flowsheet  4. Methylprednisolone (40mg /mL) Negative See  scanned flowsheet  WAIT 15 MINUTES  5. Miralax (1:100 or 1.7mg /mL) Negative See scanned flowsheet  WAIT 15 MINUTES  6. Miralax (1:10 or 17mg /mL) Negative See scanned flowsheet  WAIT 15 MINUTES  7. Miralax (1:1 or 170mg /mL) Negative See scanned flowsheet  WAIT 15 MINUTES   PUNCTURE TEST TOTAL 7    INTRADERMAL TESTING ARM #2 SIZE TIME  1. Control (negative - HSA) Negative See scanned flowsheet  2. Triamcinolone (1:100) Negative See scanned flowsheet  3. Methylprednisolone (1:100) Negative See scanned flowsheet  WAIT 15 MINUTES  4. Triamcinolone (1:10) Negative See scanned flowsheet  5. Methylprednisolone (1:10) Negative See scanned flowsheet  WAIT 15 MINUTES  6. Triamcinolone (1:1) Negative See scanned flowsheet  WAIT 15 MINUTES   INTRADERMAL TEST TOTAL 6    ORAL CHALLENGE (CPT 95076)  DOSE Pre-Vitals (BP/HR/Resp) TIME GIVEN  1. Miralax 170mg /mL suspension 0.39mL  Within normal limits See scanned flow sheet   See scanned flow sheet   WAIT 20 MINUTES  2. Miralax 170mg /mL suspension 24mL  Within normal limits See scanned flow sheet   See scanned flow sheet   WAIT 20 MINUTES  3. Miralax 170mg /mL suspension 45mL  Within normal limits See scanned flow sheet   See scanned flow sheet   WAIT 30-60 MINUTES (To be determined by the provider)   Post-Vitals (BP/HR/Resp)  Within normal limits See scanned flow sheet END TIME  See scanned flow sheet     Total Time       Allergy testing results were read and interpreted by myself, documented by clinical staff.      Salvatore Marvel, MD  [facility]

## 2020-10-03 NOTE — Patient Instructions (Signed)
If you are age 80 or older, your body mass index should be between 23-30. Your Body mass index is 26.64 kg/m. If this is out of the aforementioned range listed, please consider follow up with your Primary Care Provider.  If you are age 76 or younger, your body mass index should be between 19-25. Your Body mass index is 26.64 kg/m. If this is out of the aformentioned range listed, please consider follow up with your Primary Care Provider.   You have been scheduled for a CT scan of the abdomen and pelvis at [facility], 1st floor Radiology. You are scheduled on 10/11/20  at 11:00am. You should arrive 15 minutes prior to your appointment time for registration.  Please pick up 2 bottles of contrast from [place] at least 3 days prior to your scan. The solution may taste better if refrigerated, but do NOT add ice or any other liquid to this solution. Shake well before drinking.   Please follow the written instructions below on the day of your exam:   1) Do not eat anything after 7am (4 hours prior to your test)   2) Drink 1 bottle of contrast @ 9am (2 hours prior to your exam)  Remember to shake well before drinking and do NOT pour over ice.     Drink 1 bottle of contrast @ 10am (1 hour prior to your exam)   You may take any medications as prescribed with a small amount of water, if necessary. If you take any of the following medications: METFORMIN, GLUCOPHAGE, GLUCOVANCE, AVANDAMET, RIOMET, FORTAMET, [place] MET, JANUMET, GLUMETZA or METAGLIP, you MAY be asked to HOLD this medication 48 hours AFTER the exam.   The purpose of you drinking the oral contrast is to aid in the visualization of your intestinal tract. The contrast solution may cause some diarrhea. Depending on your individual set of symptoms, you may also receive an intravenous injection of x-ray contrast/dye. Plan on being at [facility] for 45 minutes or longer, depending on the type of exam you are having performed.   If you  have any questions regarding your exam or if you need to reschedule, you may call [facility] at [phone] between the hours of 8:00 am and 5:00 pm, Monday-Friday.    Since you are allergic to one or more components in IV contrast, we have sent a prescription for (3) 50 mg tablets of Prednisone to your pharmacy as a Pre-Medication Prep for your upcoming procedure requiring contrast.    Take (1) 50 mg tablet of prednisone 13 hours prior to your procedure at 10:00pm.   Take (1) 50 mg tablet 7 hours prior to your procedure at 4:00am.    Take (1) 50 mg tablet 1 hour prior to your procedure at 10:00am.    You also need to take 50 mg of Benadryl 1 hour prior to your procedure at 10:00am.  If you have 25 mg tablets of Benadryl, which can be purchased over the counter, you may take 2 tablets.  Otherwise we can send a prescription for (1) 50 mg tablet of Benadryl to your pharmacy.    Your provider has requested that you go to the basement level for lab work before leaving today. Press "B" on the elevator. The lab is located at the first door on the left as you exit the elevator.

## 2020-10-11 ENCOUNTER — Ambulatory Visit (HOSPITAL_COMMUNITY): Admission: RE | Admit: 2020-10-11 | Payer: Medicare Other | Source: Ambulatory Visit

## 2020-10-14 ENCOUNTER — Telehealth: Payer: Self-pay | Admitting: Physician Assistant

## 2020-10-14 NOTE — Telephone Encounter (Signed)
Inbound call from CT at [facility] stating patient's appointment on 10/11/20 was cancelled and will need to be rescheduled; patient did not pick up contrast bottles.

## 2020-10-21 ENCOUNTER — Ambulatory Visit (INDEPENDENT_AMBULATORY_CARE_PROVIDER_SITE_OTHER): Payer: Medicare Other | Admitting: *Deleted

## 2020-10-21 ENCOUNTER — Other Ambulatory Visit: Payer: Self-pay

## 2020-10-21 DIAGNOSIS — Z23 Encounter for immunization: Secondary | ICD-10-CM | POA: Diagnosis not present

## 2020-10-21 NOTE — Progress Notes (Signed)
   Covid-19 Vaccination Clinic  Name:  Sherri Murray    MRN: [id number] DOB: 03/17/1941  10/21/2020  [name] was observed post Covid-19 immunization for 30 minutes based on pre-vaccination screening without incident. She was provided with Vaccine Information Sheet and instruction to access the V-Safe system.   [name] was instructed to call 911 with any severe reactions post vaccine: Marland Kitchen Difficulty breathing  . Swelling of face and throat  . A fast heartbeat  . A bad rash all over body  . Dizziness and weakness   Immunizations Administered    Name Date Dose VIS Date Route   Pfizer COVID-19 Vaccine 10/21/2020  1:44 PM 0.3 mL 07/31/2020 Intramuscular   Manufacturer: [place]   Lot: [id number]   [place]: [id number]

## 2020-10-22 ENCOUNTER — Telehealth: Payer: Self-pay | Admitting: *Deleted

## 2020-10-22 NOTE — Telephone Encounter (Signed)
Patient also inquired about her venom injections yesterday while she was in office yesterday, do you recommend that she be put on the schedule to come in next week to resume her injection per your office visit note if she is feeling better after the COVID vaccine?

## 2020-10-22 NOTE — Telephone Encounter (Signed)
Patient called and stated that she was having a lot of body aches after receiving her first [place] vaccine yesterday, she denied any other symptoms. I advised that this was a normal side effect of the vaccine. Patient was concerned she contracted COVID and could give it to her family. I did advise the patient that the [place] vaccine was not a live vaccine and that she could not contract COVID. Patient verbalized understanding. Patient stated that she is supposed to have a Colonoscopy this coming Thursday and was wondering what she should do if she still doesn't feel well since she will have to do a bowel prep for the procedure. I advised that if she still does not feel well by tomorrow afternoon then she may need to consider contacting the physician and reschedule her Colonoscopy. Patient verbalized understanding.

## 2020-10-23 NOTE — Telephone Encounter (Signed)
Yes that is fine!  ° °Assia Meanor, MD °[facility] ° °

## 2020-10-23 NOTE — Telephone Encounter (Signed)
All true things - thank you, Ashleigh!   Salvatore Marvel, MD [facility] [place]

## 2020-10-24 ENCOUNTER — Encounter (HOSPITAL_COMMUNITY): Payer: Self-pay

## 2020-10-24 ENCOUNTER — Other Ambulatory Visit: Payer: Self-pay

## 2020-10-24 ENCOUNTER — Ambulatory Visit (HOSPITAL_COMMUNITY)
Admission: RE | Admit: 2020-10-24 | Discharge: 2020-10-24 | Disposition: A | Discharge status: Home / Self Care | Payer: Medicare Other | Source: Ambulatory Visit | Attending: Physician Assistant | Admitting: Physician Assistant

## 2020-10-24 DIAGNOSIS — K219 Gastro-esophageal reflux disease without esophagitis: Secondary | ICD-10-CM | POA: Insufficient documentation

## 2020-10-24 DIAGNOSIS — R159 Full incontinence of feces: Secondary | ICD-10-CM

## 2020-10-24 DIAGNOSIS — R1084 Generalized abdominal pain: Secondary | ICD-10-CM | POA: Diagnosis not present

## 2020-10-24 DIAGNOSIS — R0789 Other chest pain: Secondary | ICD-10-CM | POA: Insufficient documentation

## 2020-10-24 DIAGNOSIS — R1032 Left lower quadrant pain: Secondary | ICD-10-CM | POA: Diagnosis not present

## 2020-10-24 DIAGNOSIS — R194 Change in bowel habit: Secondary | ICD-10-CM | POA: Insufficient documentation

## 2020-10-24 DIAGNOSIS — I700 Atherosclerosis of aorta: Secondary | ICD-10-CM | POA: Diagnosis not present

## 2020-10-24 MED ORDER — IOHEXOL 300 MG/ML  SOLN
100.0000 mL | Freq: Once | INTRAMUSCULAR | Status: AC | PRN
  Administered 2020-10-24: 100 mL via INTRAVENOUS

## 2020-10-24 MED ORDER — DIPHENHYDRAMINE HCL 25 MG PO CAPS
ORAL_CAPSULE | ORAL | Status: AC
  Filled 2020-10-24: qty 2

## 2020-10-24 MED ORDER — DIPHENHYDRAMINE HCL 25 MG PO CAPS
50.0000 mg | ORAL_CAPSULE | Freq: Once | ORAL | Status: AC
  Administered 2020-10-24: 50 mg via ORAL

## 2020-10-29 ENCOUNTER — Other Ambulatory Visit: Payer: Self-pay | Admitting: Allergy & Immunology

## 2020-10-31 NOTE — Telephone Encounter (Signed)
Called and was unable to reach the patient. Called and left a voicemail on her daughter's phone asking for her to return call or have her mother return call so we can get her scheduled to come in and start back with her venom injections.

## 2020-11-09 ENCOUNTER — Telehealth: Payer: Self-pay | Admitting: Family Medicine

## 2020-11-09 DIAGNOSIS — E890 Postprocedural hypothyroidism: Secondary | ICD-10-CM

## 2020-11-09 DIAGNOSIS — R7303 Prediabetes: Secondary | ICD-10-CM

## 2020-11-09 NOTE — Telephone Encounter (Signed)
-----   Message from Ellamae Sia sent at 11/04/2020 10:42 AM EST ----- Regarding: Lab orders for Tuesday, 2.8.22  AWV lab orders, please.

## 2020-11-11 ENCOUNTER — Ambulatory Visit (INDEPENDENT_AMBULATORY_CARE_PROVIDER_SITE_OTHER): Payer: Medicare Other

## 2020-11-11 ENCOUNTER — Other Ambulatory Visit: Payer: Self-pay

## 2020-11-11 DIAGNOSIS — Z23 Encounter for immunization: Secondary | ICD-10-CM | POA: Diagnosis not present

## 2020-11-11 NOTE — Telephone Encounter (Signed)
Where are we starting patient at with venom. She was at maintenance every 4 weeks. Please advise.

## 2020-11-11 NOTE — Progress Notes (Signed)
   Covid-19 Vaccination Clinic  Name:  Shalissa Easterwood    MRN: [id number] DOB: 06/17/1941  11/11/2020  Ms. Shepardson was observed post Covid-19 immunization for 15 minutes without incident. She was provided with Vaccine Information Sheet and instruction to access the V-Safe system.   Ms. Steinmiller was instructed to call 911 with any severe reactions post vaccine: Marland Kitchen Difficulty breathing  . Swelling of face and throat  . A fast heartbeat  . A bad rash all over body  . Dizziness and weakness   Immunizations Administered    Name Date Dose VIS Date Route   Pfizer COVID-19 Vaccine 11/11/2020  1:34 PM 0.3 mL 07/31/2020 Intramuscular   Manufacturer: [place]   Lot: [id number]   [place]: [address]-1

## 2020-11-12 NOTE — Telephone Encounter (Signed)
Lets start her at a strength of 10 mcg/mL at 0.05 mL and advance from there.  Salvatore Marvel, MD [facility] [place]

## 2020-11-13 NOTE — Telephone Encounter (Signed)
Patient scheduled for next week to restart @ 0.05 cc of the 34mcg/mL of the Mixed Vespid. (clarified with Beth on concentration)

## 2020-11-13 NOTE — Telephone Encounter (Signed)
Flowsheet has been updated to reflect this change. Dr. Ernst Bowler also stated that once she reaches 1.0 ml can go to every 4 weeks.

## 2020-11-14 ENCOUNTER — Ambulatory Visit: Payer: Medicare Other | Admitting: Internal Medicine

## 2020-11-15 ENCOUNTER — Other Ambulatory Visit: Payer: Self-pay

## 2020-11-15 ENCOUNTER — Ambulatory Visit (INDEPENDENT_AMBULATORY_CARE_PROVIDER_SITE_OTHER): Payer: Medicare Other

## 2020-11-15 DIAGNOSIS — Z748 Other problems related to care provider dependency: Secondary | ICD-10-CM | POA: Diagnosis not present

## 2020-11-15 DIAGNOSIS — Z0000 Encounter for general adult medical examination without abnormal findings: Secondary | ICD-10-CM | POA: Diagnosis not present

## 2020-11-15 NOTE — Patient Instructions (Signed)
Sherri Murray , Thank you for taking time to come for your Medicare Wellness Visit. I appreciate your ongoing commitment to your health goals. Please review the following plan we discussed and let me know if I can assist you in the future.   Screening recommendations/referrals: Colonoscopy: no longer required  Mammogram: due, Please call and schedule appointment soon Bone Density: Up to date, completed 09/12/2019, due 09/2021 Recommended yearly ophthalmology/optometry visit for glaucoma screening and checkup Recommended yearly dental visit for hygiene and checkup  Vaccinations: Influenza vaccine: due, will get at upcoming physical  Pneumococcal vaccine: Completed series Tdap vaccine: decline-insurance Shingles vaccine: due, check with your insurance regarding coverage if interested    Covid-19:Up to date, completed 11/11/2020 booster due 04/2021  Advanced directives: Please bring a copy of your POA (Power of [place]) and/or Living Will to your next appointment.  Conditions/risks identified: hypertension  Next appointment: Follow up in one year for your annual wellness visit    Preventive Care 24 Years and Older, Female Preventive care refers to lifestyle choices and visits with your health care provider that can promote health and wellness. What does preventive care include?  A yearly physical exam. This is also called an annual well check.  Dental exams once or twice a year.  Routine eye exams. Ask your health care provider how often you should have your eyes checked.  Personal lifestyle choices, including:  Daily care of your teeth and gums.  Regular physical activity.  Eating a healthy diet.  Avoiding tobacco and drug use.  Limiting alcohol use.  Practicing safe sex.  Taking low-dose aspirin every day.  Taking vitamin and mineral supplements as recommended by your health care provider. What happens during an annual well check? The services and screenings done by your  health care provider during your annual well check will depend on your age, overall health, lifestyle risk factors, and family history of disease. Counseling  Your health care provider may ask you questions about your:  Alcohol use.  Tobacco use.  Drug use.  Emotional well-being.  Home and relationship well-being.  Sexual activity.  Eating habits.  History of falls.  Memory and ability to understand (cognition).  Work and work Statistician.  Reproductive health. Screening  You may have the following tests or measurements:  Height, weight, and BMI.  Blood pressure.  Lipid and cholesterol levels. These may be checked every 5 years, or more frequently if you are over 12 years old.  Skin check.  Lung cancer screening. You may have this screening every year starting at age 19 if you have a 30-pack-year history of smoking and currently smoke or have quit within the past 15 years.  Fecal occult blood test (FOBT) of the stool. You may have this test every year starting at age 51.  Flexible sigmoidoscopy or colonoscopy. You may have a sigmoidoscopy every 5 years or a colonoscopy every 10 years starting at age 27.  Hepatitis C blood test.  Hepatitis B blood test.  Sexually transmitted disease (STD) testing.  Diabetes screening. This is done by checking your blood sugar (glucose) after you have not eaten for a while (fasting). You may have this done every 1-3 years.  Bone density scan. This is done to screen for osteoporosis. You may have this done starting at age 59.  Mammogram. This may be done every 1-2 years. Talk to your health care provider about how often you should have regular mammograms. Talk with your health care provider about your test results,  treatment options, and if necessary, the need for more tests. Vaccines  Your health care provider may recommend certain vaccines, such as:  Influenza vaccine. This is recommended every year.  Tetanus, diphtheria, and  acellular pertussis (Tdap, Td) vaccine. You may need a Td booster every 10 years.  Zoster vaccine. You may need this after age 74.  Pneumococcal 13-valent conjugate (PCV13) vaccine. One dose is recommended after age 55.  Pneumococcal polysaccharide (PPSV23) vaccine. One dose is recommended after age 59. Talk to your health care provider about which screenings and vaccines you need and how often you need them. This information is not intended to replace advice given to you by your health care provider. Make sure you discuss any questions you have with your health care provider. Document Released: 10/25/2015 Document Revised: 06/17/2016 Document Reviewed: 07/30/2015 Elsevier Interactive Patient Education  2017 [employer] Prevention in the Home Falls can cause injuries. They can happen to people of all ages. There are many things you can do to make your home safe and to help prevent falls. What can I do on the outside of my home?  Regularly fix the edges of walkways and driveways and fix any cracks.  Remove anything that might make you trip as you walk through a door, such as a raised step or threshold.  Trim any bushes or trees on the path to your home.  Use bright outdoor lighting.  Clear any walking paths of anything that might make someone trip, such as rocks or tools.  Regularly check to see if handrails are loose or broken. Make sure that both sides of any steps have handrails.  Any raised decks and porches should have guardrails on the edges.  Have any leaves, snow, or ice cleared regularly.  Use sand or salt on walking paths during winter.  Clean up any spills in your garage right away. This includes oil or grease spills. What can I do in the bathroom?  Use night lights.  Install grab bars by the toilet and in the tub and shower. Do not use towel bars as grab bars.  Use non-skid mats or decals in the tub or shower.  If you need to sit down in the shower, use  a plastic, non-slip stool.  Keep the floor dry. Clean up any water that spills on the floor as soon as it happens.  Remove soap buildup in the tub or shower regularly.  Attach bath mats securely with double-sided non-slip rug tape.  Do not have throw rugs and other things on the floor that can make you trip. What can I do in the bedroom?  Use night lights.  Make sure that you have a light by your bed that is easy to reach.  Do not use any sheets or blankets that are too big for your bed. They should not hang down onto the floor.  Have a firm chair that has side arms. You can use this for support while you get dressed.  Do not have throw rugs and other things on the floor that can make you trip. What can I do in the kitchen?  Clean up any spills right away.  Avoid walking on wet floors.  Keep items that you use a lot in easy-to-reach places.  If you need to reach something above you, use a strong step stool that has a grab bar.  Keep electrical cords out of the way.  Do not use floor polish or wax that makes floors  slippery. If you must use wax, use non-skid floor wax.  Do not have throw rugs and other things on the floor that can make you trip. What can I do with my stairs?  Do not leave any items on the stairs.  Make sure that there are handrails on both sides of the stairs and use them. Fix handrails that are broken or loose. Make sure that handrails are as long as the stairways.  Check any carpeting to make sure that it is firmly attached to the stairs. Fix any carpet that is loose or worn.  Avoid having throw rugs at the top or bottom of the stairs. If you do have throw rugs, attach them to the floor with carpet tape.  Make sure that you have a light switch at the top of the stairs and the bottom of the stairs. If you do not have them, ask someone to add them for you. What else can I do to help prevent falls?  Wear shoes that:  Do not have high heels.  Have  rubber bottoms.  Are comfortable and fit you well.  Are closed at the toe. Do not wear sandals.  If you use a stepladder:  Make sure that it is fully opened. Do not climb a closed stepladder.  Make sure that both sides of the stepladder are locked into place.  Ask someone to hold it for you, if possible.  Clearly mark and make sure that you can see:  Any grab bars or handrails.  First and last steps.  Where the edge of each step is.  Use tools that help you move around (mobility aids) if they are needed. These include:  Canes.  Walkers.  Scooters.  Crutches.  Turn on the lights when you go into a dark area. Replace any light bulbs as soon as they burn out.  Set up your furniture so you have a clear path. Avoid moving your furniture around.  If any of your floors are uneven, fix them.  If there are any pets around you, be aware of where they are.  Review your medicines with your doctor. Some medicines can make you feel dizzy. This can increase your chance of falling. Ask your doctor what other things that you can do to help prevent falls. This information is not intended to replace advice given to you by your health care provider. Make sure you discuss any questions you have with your health care provider. Document Released: 07/25/2009 Document Revised: 03/05/2016 Document Reviewed: 11/02/2014 Elsevier Interactive Patient Education  2017 [employer].

## 2020-11-15 NOTE — Progress Notes (Signed)
PCP notes:  Health Maintenance: Flu- due  Tdap- decline-insurance Mammogram- due    Abnormal Screenings: none   Patient concerns: none   Nurse concerns: none   Next PCP appt: 11/22/2020 @ 3:40 pm

## 2020-11-15 NOTE — Progress Notes (Addendum)
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 screening: No longer required.   Mammogram status: due, Patient will call and schedule appointment soon.  Bone Density status: Completed 09/12/2019. Results reflect: Bone density results: OSTEOPOROSIS. Repeat every 2 years.  Lung Cancer Screening: (Low Dose CT Chest recommended if Age 44-80 years, 30 pack-year currently smoking OR have quit w/in 15 years.) does not qualify.   Additional Screening:  Hepatitis C Screening: does not qualify; Completed N/A  Vision Screening: Recommended annual ophthalmology exams for early detection of glaucoma and other disorders of the eye. Is the patient up to date with their annual eye exam?  Yes  Who is the provider or what is the name of the office in which the patient attends annual eye exams? Dr. Wallace Going, [facility]  If pt is not established with a provider, would they like to be referred to a provider to establish care? No .   Dental Screening: Recommended annual dental exams for proper oral hygiene  Community Resource Referral / Chronic Care Management: CRR  required this visit?  No   CCM required this visit?  No      Plan:     I have personally reviewed and noted the following in the patient's chart:   . Medical and social history . Use of alcohol, tobacco or illicit drugs  . Current medications and supplements . Functional ability and status . Nutritional status . Physical activity . Advanced directives . List of other physicians . Hospitalizations, surgeries, and ER visits in previous 12 months . Vitals . Screenings to include cognitive, depression, and falls . Referrals and appointments  In addition, I have reviewed and discussed with patient certain preventive protocols, quality metrics, and best practice recommendations. A written personalized care plan for preventive services as well as general preventive health recommendations were provided to patient.   Due to this being a telephonic visit, the after visit summary with patients personalized plan was offered to patient via office or my-chart. Patient preferred to pick up at office at next visit or via mychart.   Andrez Grime, LPN   [id number]

## 2020-11-19 ENCOUNTER — Telehealth: Payer: Self-pay | Admitting: Family Medicine

## 2020-11-19 ENCOUNTER — Other Ambulatory Visit (INDEPENDENT_AMBULATORY_CARE_PROVIDER_SITE_OTHER): Payer: Medicare Other

## 2020-11-19 ENCOUNTER — Telehealth: Payer: Self-pay | Admitting: Physician Assistant

## 2020-11-19 ENCOUNTER — Other Ambulatory Visit: Payer: Self-pay

## 2020-11-19 DIAGNOSIS — E890 Postprocedural hypothyroidism: Secondary | ICD-10-CM | POA: Diagnosis not present

## 2020-11-19 DIAGNOSIS — R7303 Prediabetes: Secondary | ICD-10-CM | POA: Diagnosis not present

## 2020-11-19 NOTE — Telephone Encounter (Signed)
   Telephone encounter was:  Unsuccessful.  11/19/2020 Name: Sherri Murray MRN: [id number] DOB: 03-23-1941  Unsuccessful outbound call made today to assist with:  Transportation Needs   Outreach Attempt:  1st Attempt  A HIPAA compliant voice message was left requesting a return call.  Instructed patient to call back at [phone].  [place], Care Management Phone: [phone] Email: [email or web address]

## 2020-11-19 NOTE — Telephone Encounter (Signed)
Holly from [facility] called sdtating that they received referral for pt but they also needs ov notes. Pls fax them to [phone] or call them at [phone], ext [phone]

## 2020-11-19 NOTE — Telephone Encounter (Signed)
OV notes were faxed to [facility] at [phone].

## 2020-11-20 ENCOUNTER — Ambulatory Visit (INDEPENDENT_AMBULATORY_CARE_PROVIDER_SITE_OTHER): Payer: Medicare Other

## 2020-11-20 DIAGNOSIS — T63441D Toxic effect of venom of bees, accidental (unintentional), subsequent encounter: Secondary | ICD-10-CM | POA: Diagnosis not present

## 2020-11-20 LAB — COMPREHENSIVE METABOLIC PANEL
ALT: 19 U/L (ref 0–35)
AST: 28 U/L (ref 0–37)
Albumin: 4.5 g/dL (ref 3.5–5.2)
Alkaline Phosphatase: 60 U/L (ref 39–117)
BUN: 12 mg/dL (ref 6–23)
CO2: 28 mEq/L (ref 19–32)
Calcium: 10.4 mg/dL (ref 8.4–10.5)
Chloride: 101 mEq/L (ref 96–112)
Creatinine, Ser: 0.79 mg/dL (ref 0.40–1.20)
GFR: 70.56 mL/min (ref >60.00)
Glucose, Bld: 96 mg/dL (ref 70–99)
Potassium: 4 mEq/L (ref 3.5–5.1)
Sodium: 137 mEq/L (ref 135–145)
Total Bilirubin: 0.3 mg/dL (ref 0.2–1.2)
Total Protein: 7.4 g/dL (ref 6.0–8.3)

## 2020-11-20 LAB — T3, FREE: T3, Free: 4 pg/mL (ref 2.3–4.2)

## 2020-11-20 LAB — LIPID PANEL
Cholesterol: 159 mg/dL (ref 0–200)
HDL: 56.9 mg/dL (ref >39.00)
LDL Cholesterol: 79 mg/dL (ref 0–99)
NonHDL: 102.56
Total CHOL/HDL Ratio: 3
Triglycerides: 119 mg/dL (ref 0.0–149.0)
VLDL: 23.8 mg/dL (ref 0.0–40.0)

## 2020-11-20 LAB — HEMOGLOBIN A1C: Hgb A1c MFr Bld: 5.8 % (ref 4.6–6.5)

## 2020-11-20 LAB — T4, FREE: Free T4: 1.13 ng/dL (ref 0.60–1.60)

## 2020-11-20 LAB — TSH: TSH: 2.9 u[IU]/mL (ref 0.35–4.50)

## 2020-11-20 NOTE — Progress Notes (Signed)
No critical labs need to be addressed urgently. We will discuss labs in detail at upcoming office visit.   

## 2020-11-21 ENCOUNTER — Other Ambulatory Visit: Payer: Self-pay

## 2020-11-22 ENCOUNTER — Encounter: Payer: Self-pay | Admitting: Family Medicine

## 2020-11-22 ENCOUNTER — Telehealth: Payer: Self-pay | Admitting: Family Medicine

## 2020-11-22 ENCOUNTER — Ambulatory Visit (INDEPENDENT_AMBULATORY_CARE_PROVIDER_SITE_OTHER): Payer: Medicare Other | Admitting: Family Medicine

## 2020-11-22 ENCOUNTER — Other Ambulatory Visit: Payer: Self-pay

## 2020-11-22 VITALS — BP 120/70 | HR 60 | Temp 98.1°F | Ht 63.0 in | Wt 150.2 lb

## 2020-11-22 DIAGNOSIS — E7800 Pure hypercholesterolemia, unspecified: Secondary | ICD-10-CM | POA: Diagnosis not present

## 2020-11-22 DIAGNOSIS — Z0000 Encounter for general adult medical examination without abnormal findings: Secondary | ICD-10-CM

## 2020-11-22 DIAGNOSIS — R7303 Prediabetes: Secondary | ICD-10-CM | POA: Diagnosis not present

## 2020-11-22 DIAGNOSIS — M797 Fibromyalgia: Secondary | ICD-10-CM

## 2020-11-22 DIAGNOSIS — I10 Essential (primary) hypertension: Secondary | ICD-10-CM

## 2020-11-22 NOTE — Progress Notes (Signed)
Patient ID: Sherri Murray, female    DOB: [birth year], 80 y.o.   MRN: [id number]  This visit was conducted in person.  BP 120/70   Pulse 60   Temp 98.1 F (36.7 C) (Temporal)   Ht 5\' 3"  (1.6 m)   Wt 150 lb 4 oz (68.2 kg)   SpO2 96%   BMI 26.62 kg/m    CC:  Chief Complaint  Patient presents with  . Annual Exam    Part 2    Subjective:   HPI: Sherri Murray is a 80 y.o. female presenting on 11/22/2020 for Annual Exam (Part 2)  The patient saw a LPN or RN for medicare wellness visit.  Prevention and wellness was reviewed in detail. Note reviewed and important notes copied below.  Health Maintenance: Flu- due  Tdap- decline-insurance Mammogram- due  Abnormal Screenings: none    11/22/20  Fibromyalgia: followed by Estanislado Pandy   She has noted hair thinning .. taking vitamin. Nephew passed away in July, COVID 08-06-2020 then could go a wedding.. increase stress.   COIVD in Aug 06, 2020.. S/P monoclonal antibodies... made her feel wore.   Prediabetes,  Lab Results  Component Value Date   HGBA1C 5.8 11/19/2020    Hypothyroid  Lab Results  Component Value Date   TSH 2.90 11/19/2020     Elevated Cholesterol:  Lab Results  Component Value Date   CHOL 159 11/19/2020   HDL 56.90 11/19/2020   LDLCALC 79 11/19/2020   LDLDIRECT 136.1 11/02/2013   TRIG 119.0 11/19/2020   CHOLHDL 3 11/19/2020  Using medications without problems: Muscle aches:  Diet compliance: good Exercise: trying to stay active... pedaling machine 2 times daily Other complaints:  Hypertension:    tolerable control on losartan BP Readings from Last 3 Encounters:  11/22/20 120/70  10/03/20 110/60  10/03/20 128/80  Using medication without problems or lightheadedness: none Chest pain with exertion: none Edema:none Short of breath: none Average home BPs: Other issues:   Relevant past medical, surgical, family and social history reviewed and updated as indicated. Interim  medical history since our last visit reviewed. Allergies and medications reviewed and updated. Outpatient Medications Prior to Visit  Medication Sig Dispense Refill  . acetaminophen (TYLENOL) 325 MG tablet Take 650 mg by mouth every 6 (six) hours as needed.    Marland Kitchen alendronate (FOSAMAX) 70 MG tablet TAKE 1 TABLET (70 MG TOTAL) BY MOUTH ONCE A WEEK. TAKE WITH A FULL GLASS OF WATER ON AN EMPTY STOMACH. 4 tablet 0  . atorvastatin (LIPITOR) 40 MG tablet TAKE 1 TABLET BY MOUTH EVERYDAY AT BEDTIME 90 tablet 0  . Biotin 5000 MCG TABS Take 5,000 mcg by mouth daily.    . Calcium Carbonate-Vitamin D 600-400 MG-UNIT chew tablet Chew 1 tablet by mouth daily.     . Cholecalciferol (VITAMIN D PO) Take by mouth daily.    Marland Kitchen dicyclomine (BENTYL) 20 MG tablet TAKE 1 TABLET (20 MG TOTAL) BY MOUTH 4 (FOUR) TIMES DAILY - BEFORE MEALS AND AT BEDTIME. 360 tablet 0  . diphenhydramine-acetaminophen (TYLENOL PM) 25-500 MG TABS tablet Take 1 tablet by mouth at bedtime as needed.    Marland Kitchen EPINEPHRINE 0.3 mg/0.3 mL IJ SOAJ injection INJECT 0.3 MLS (0.3 MG TOTAL) INTO THE MUSCLE AS NEEDED. 2 each 2  . FOLIC ACID PO Take 1 tablet by mouth daily.     Marland Kitchen GLUCOSAMINE-CHONDROITIN PO Take 1 tablet by mouth 2 (two) times daily.    Marland Kitchen ipratropium (ATROVENT) 0.03 % nasal  spray PLACE 1 SPRAY INTO BOTH NOSTRILS 4 (FOUR) TIMES DAILY. AS NEEDED. 30 mL 0  . loperamide (IMODIUM) 2 MG capsule Take 2 mg by mouth as needed for diarrhea or loose stools.    Marland Kitchen loratadine (CLARITIN) 10 MG tablet Take 10 mg by mouth daily as needed for allergies.    Marland Kitchen losartan (COZAAR) 25 MG tablet TAKE 2 TABLETS BY MOUTH EVERY DAY 180 tablet 1  . MELATONIN PO Take by mouth at bedtime.    . meloxicam (MOBIC) 15 MG tablet TAKE 1 TABLET BY MOUTH EVERY DAY 90 tablet 0  . mometasone (NASONEX) 50 MCG/ACT nasal spray INHALE TWO SPRAYS EACH NOSTRIL ONCE A DAY AS NEEDED FOR NASAL CONGESTION OR DRAINAGE. 17 g 1  . Multiple Vitamins-Minerals (CENTRUM SILVER) tablet Take 1 tablet by  mouth daily.    Marland Kitchen nystatin cream (MYCOSTATIN) APPLY TO AFFECTED AREA TWICE A DAY 60 g 3  . oxybutynin (DITROPAN XL) 15 MG 24 hr tablet TAKE 1 TABLET BY MOUTH EVERYDAY AT BEDTIME 30 tablet 11  . pantoprazole (PROTONIX) 40 MG tablet Take 1 tablet (40 mg total) by mouth 2 (two) times daily. 60 tablet 2  . diphenhydrAMINE (BENADRYL) 50 MG tablet Take 1 tablet (50 mg total) by mouth at bedtime as needed for itching. 1 tablet 0  . loratadine (CLARITIN) 10 MG tablet Take 1 tablet (10 mg total) by mouth daily. (Patient taking differently: Take 10 mg by mouth daily as needed.) 90 tablet 1  . montelukast (SINGULAIR) 10 MG tablet TAKE 1 TABLET BY MOUTH EVERYDAY AT BEDTIME 30 tablet 0  . Olopatadine HCl 0.2 % SOLN PLACE 1 DROP INTO BOTH EYES 2 (TWO) TIMES DAILY AS NEEDED. 7.5 mL 1  . predniSONE (DELTASONE) 50 MG tablet Take one tablet 13 hrs , 7 hrs, and 1 hr prior to CT. Follow directions given in office. 3 tablet 0   No facility-administered medications prior to visit.     Per HPI unless specifically indicated in ROS section below Review of Systems  Constitutional: Negative for fatigue and fever.  HENT: Negative for congestion.   Eyes: Negative for pain.  Respiratory: Negative for cough and shortness of breath.   Cardiovascular: Negative for chest pain, palpitations and leg swelling.  Gastrointestinal: Negative for abdominal pain.  Genitourinary: Negative for dysuria and vaginal bleeding.  Musculoskeletal: Negative for back pain.  Neurological: Negative for syncope, light-headedness and headaches.  Psychiatric/Behavioral: Negative for dysphoric mood.   Objective:  BP 120/70   Pulse 60   Temp 98.1 F (36.7 C) (Temporal)   Ht 5\' 3"  (1.6 m)   Wt 150 lb 4 oz (68.2 kg)   SpO2 96%   BMI 26.62 kg/m   Wt Readings from Last 3 Encounters:  11/22/20 150 lb 4 oz (68.2 kg)  10/03/20 151 lb 9.6 oz (68.8 kg)  10/03/20 150 lb 6 oz (68.2 kg)      Physical Exam Constitutional:      General: She is  not in acute distress.Vital signs are normal.     Appearance: Normal appearance. She is well-developed and well-nourished. She is not ill-appearing or toxic-appearing.  HENT:     Head: Normocephalic.     Right Ear: Hearing, tympanic membrane, ear canal and external ear normal. Tympanic membrane is not erythematous, retracted or bulging.     Left Ear: Hearing, tympanic membrane, ear canal and external ear normal. Tympanic membrane is not erythematous, retracted or bulging.     Nose: No mucosal edema or  rhinorrhea.     Right Sinus: No maxillary sinus tenderness or frontal sinus tenderness.     Left Sinus: No maxillary sinus tenderness or frontal sinus tenderness.     Mouth/Throat:     Mouth: Oropharynx is clear and moist and mucous membranes are normal.     Pharynx: Uvula midline.  Eyes:     General: Lids are normal. Lids are everted, no foreign bodies appreciated.     Extraocular Movements: EOM normal.     Conjunctiva/sclera: Conjunctivae normal.     Pupils: Pupils are equal, round, and reactive to light.  Neck:     Thyroid: No thyroid mass or thyromegaly.     Vascular: No carotid bruit.     Trachea: Trachea normal.  Cardiovascular:     Rate and Rhythm: Normal rate and regular rhythm.     Pulses: Normal pulses and intact distal pulses.     Heart sounds: Normal heart sounds, S1 normal and S2 normal. No murmur heard. No friction rub. No gallop.   Pulmonary:     Effort: Pulmonary effort is normal. No tachypnea or respiratory distress.     Breath sounds: Normal breath sounds. No decreased breath sounds, wheezing, rhonchi or rales.  Abdominal:     General: Bowel sounds are normal.     Palpations: Abdomen is soft.     Tenderness: There is no abdominal tenderness.  Musculoskeletal:     Cervical back: Normal range of motion and neck supple.  Skin:    General: Skin is warm, dry and intact.     Findings: No rash.     Comments: Thinning hair diffusely and primarily on front and top of head  in female  Hair loss pattern   Neurological:     Mental Status: She is alert.  Psychiatric:        Mood and Affect: Mood is not anxious or depressed.        Speech: Speech normal.        Behavior: Behavior normal. Behavior is cooperative.        Thought Content: Thought content normal.        Cognition and Memory: Cognition and memory normal.        Judgment: Judgment normal.       Results for orders placed or performed in visit on 11/19/20  T3, free  Result Value Ref Range   T3, Free 4.0 2.3 - 4.2 pg/mL  T4, free  Result Value Ref Range   Free T4 1.13 0.60 - 1.60 ng/dL  TSH  Result Value Ref Range   TSH 2.90 0.35 - 4.50 uIU/mL  Comprehensive metabolic panel  Result Value Ref Range   Sodium 137 135 - 145 mEq/L   Potassium 4.0 3.5 - 5.1 mEq/L   Chloride 101 96 - 112 mEq/L   CO2 28 19 - 32 mEq/L   Glucose, Bld 96 70 - 99 mg/dL   BUN 12 6 - 23 mg/dL   Creatinine, Ser 0.79 0.40 - 1.20 mg/dL   Total Bilirubin 0.3 0.2 - 1.2 mg/dL   Alkaline Phosphatase 60 39 - 117 U/L   AST 28 0 - 37 U/L   ALT 19 0 - 35 U/L   Total Protein 7.4 6.0 - 8.3 g/dL   Albumin 4.5 3.5 - 5.2 g/dL   GFR 70.56 >60.00 mL/min   Calcium 10.4 8.4 - 10.5 mg/dL  Lipid panel  Result Value Ref Range   Cholesterol 159 0 - 200 mg/dL   Triglycerides  119.0 0.0 - 149.0 mg/dL   HDL 56.90 >39.00 mg/dL   VLDL 23.8 0.0 - 40.0 mg/dL   LDL Cholesterol 79 0 - 99 mg/dL   Total CHOL/HDL Ratio 3    NonHDL 102.56   Hemoglobin A1c  Result Value Ref Range   Hgb A1c MFr Bld 5.8 4.6 - 6.5 %    This visit occurred during the SARS-CoV-2 public health emergency.  Safety protocols were in place, including screening questions prior to the visit, additional usage of staff PPE, and extensive cleaning of exam room while observing appropriate contact time as indicated for disinfecting solutions.   COVID 19 screen:  No recent travel or known exposure to COVID19 The patient denies respiratory symptoms of COVID 19 at this time. The  importance of social distancing was discussed today.   Assessment and Plan   The patient's preventative maintenance and recommended screening tests for an annual wellness exam were reviewed in full today. Brought up to date unless services declined.  Counselled on the importance of diet, exercise, and its role in overall health and mortality. The patient's FH and SH was reviewed, including their home life, tobacco status, and drug and alcohol status.    Vaccine: COVID x 2, S/P PNA, She reports she shingrix x 1, but never got the second.  Plans yearly mammogram.  Colon: not indicated at age 89.  DEXA 2020 osteoporosis on fosamax.. plan repeat 09/2021  Problem List Items Addressed This Visit    Benign essential HTN    Stable, chronic.  Continue current medication.  Losartan 50 mg daily        Fibromyalgia    Followed by rheumatology.      High cholesterol    Stable, chronic.  Continue current medication.   Atorvastatin 40 mg daily.      Prediabetes    Stable control. Encouraged exercise, weight loss, healthy eating habits.        Other Visit Diagnoses    Routine general medical examination at a health care facility    -  Primary      Eliezer Lofts, MD

## 2020-11-22 NOTE — Assessment & Plan Note (Signed)
Stable, chronic.  Continue current medication.  Losartan 50 mg daily 

## 2020-11-22 NOTE — Assessment & Plan Note (Signed)
Stable, chronic.  Continue current medication.   Atorvastatin 40 mg daily. 

## 2020-11-22 NOTE — Assessment & Plan Note (Signed)
Followed by rheumatology. 

## 2020-11-22 NOTE — Telephone Encounter (Signed)
° °  Telephone encounter was:  Successful.  11/22/2020 Name: Deysha Cartier MRN: [id number] DOB: 03-Apr-1941  Lilliemae Fruge is a 80 y.o. year old female who is a primary care patient of Bedsole, Amy E, MD . The community resource team was consulted for assistance with Transportation Needs   Care guide performed the following interventions: Patient provided with information about care guide support team and interviewed to confirm resource needs Investigation of community resources performed Discussed resources to assist with transportation through both [place] insurance and through [place]. Gave her the numbers over the phone and then sent her a letter to make sure that she had a hard copy of the information. She does not have a computer could not email her. She didn't have any other resource needs at this time. .  Follow Up Plan:  No further follow up planned at this time. The patient has been provided with needed resources.  [place], Care Management Phone: [phone] Email: [email or web address]

## 2020-11-22 NOTE — Assessment & Plan Note (Signed)
Stable control. Encouraged exercise, weight loss, healthy eating habits.  

## 2020-11-22 NOTE — Patient Instructions (Addendum)
Call to set mammogram.  Change to baby shampoo, stop products on scalp. Given hair loss time to improve.  Consider biotin and vit E for hair health.

## 2020-11-25 DIAGNOSIS — H43813 Vitreous degeneration, bilateral: Secondary | ICD-10-CM | POA: Diagnosis not present

## 2020-11-27 DIAGNOSIS — H5213 Myopia, bilateral: Secondary | ICD-10-CM | POA: Diagnosis not present

## 2020-11-27 DIAGNOSIS — R350 Frequency of micturition: Secondary | ICD-10-CM | POA: Diagnosis not present

## 2020-11-28 ENCOUNTER — Other Ambulatory Visit: Payer: Self-pay

## 2020-11-28 ENCOUNTER — Ambulatory Visit (INDEPENDENT_AMBULATORY_CARE_PROVIDER_SITE_OTHER): Payer: Medicare Other

## 2020-11-28 DIAGNOSIS — R350 Frequency of micturition: Secondary | ICD-10-CM | POA: Diagnosis not present

## 2020-11-28 DIAGNOSIS — T63441D Toxic effect of venom of bees, accidental (unintentional), subsequent encounter: Secondary | ICD-10-CM

## 2020-12-05 ENCOUNTER — Ambulatory Visit (INDEPENDENT_AMBULATORY_CARE_PROVIDER_SITE_OTHER): Payer: Medicare Other

## 2020-12-05 ENCOUNTER — Other Ambulatory Visit: Payer: Self-pay

## 2020-12-05 DIAGNOSIS — T63441D Toxic effect of venom of bees, accidental (unintentional), subsequent encounter: Secondary | ICD-10-CM | POA: Diagnosis not present

## 2020-12-11 ENCOUNTER — Other Ambulatory Visit: Payer: Self-pay | Admitting: Allergy & Immunology

## 2020-12-12 ENCOUNTER — Other Ambulatory Visit: Payer: Self-pay

## 2020-12-12 ENCOUNTER — Ambulatory Visit (INDEPENDENT_AMBULATORY_CARE_PROVIDER_SITE_OTHER): Payer: Medicare Other

## 2020-12-12 DIAGNOSIS — T63441D Toxic effect of venom of bees, accidental (unintentional), subsequent encounter: Secondary | ICD-10-CM | POA: Diagnosis not present

## 2020-12-14 ENCOUNTER — Other Ambulatory Visit: Payer: Self-pay | Admitting: Family Medicine

## 2020-12-16 ENCOUNTER — Other Ambulatory Visit: Payer: Self-pay | Admitting: Allergy & Immunology

## 2020-12-19 ENCOUNTER — Ambulatory Visit (INDEPENDENT_AMBULATORY_CARE_PROVIDER_SITE_OTHER): Payer: Medicare Other

## 2020-12-19 DIAGNOSIS — T63441D Toxic effect of venom of bees, accidental (unintentional), subsequent encounter: Secondary | ICD-10-CM

## 2020-12-24 ENCOUNTER — Ambulatory Visit (INDEPENDENT_AMBULATORY_CARE_PROVIDER_SITE_OTHER): Payer: Medicare Other | Admitting: Podiatry

## 2020-12-24 ENCOUNTER — Other Ambulatory Visit: Payer: Self-pay

## 2020-12-24 DIAGNOSIS — M79676 Pain in unspecified toe(s): Secondary | ICD-10-CM

## 2020-12-24 DIAGNOSIS — B351 Tinea unguium: Secondary | ICD-10-CM

## 2020-12-24 DIAGNOSIS — M7752 Other enthesopathy of left foot: Secondary | ICD-10-CM | POA: Diagnosis not present

## 2020-12-24 DIAGNOSIS — L989 Disorder of the skin and subcutaneous tissue, unspecified: Secondary | ICD-10-CM

## 2020-12-24 MED ORDER — BETAMETHASONE SOD PHOS & ACET 6 (3-3) MG/ML IJ SUSP
3.0000 mg | Freq: Once | INTRAMUSCULAR | Status: AC
  Administered 2020-12-24: 3 mg via INTRA_ARTICULAR
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
## 2020-12-26 ENCOUNTER — Ambulatory Visit (INDEPENDENT_AMBULATORY_CARE_PROVIDER_SITE_OTHER): Payer: Medicare Other | Admitting: *Deleted

## 2020-12-26 ENCOUNTER — Other Ambulatory Visit: Payer: Self-pay

## 2020-12-26 DIAGNOSIS — J309 Allergic rhinitis, unspecified: Secondary | ICD-10-CM | POA: Diagnosis not present

## 2021-01-02 ENCOUNTER — Ambulatory Visit (INDEPENDENT_AMBULATORY_CARE_PROVIDER_SITE_OTHER): Payer: Medicare Other | Admitting: *Deleted

## 2021-01-02 DIAGNOSIS — T63441D Toxic effect of venom of bees, accidental (unintentional), subsequent encounter: Secondary | ICD-10-CM

## 2021-01-02 DIAGNOSIS — H524 Presbyopia: Secondary | ICD-10-CM | POA: Diagnosis not present

## 2021-01-03 DIAGNOSIS — T85698A Other mechanical complication of other specified internal prosthetic devices, implants and grafts, initial encounter: Secondary | ICD-10-CM | POA: Diagnosis not present

## 2021-01-09 ENCOUNTER — Ambulatory Visit (INDEPENDENT_AMBULATORY_CARE_PROVIDER_SITE_OTHER): Payer: Medicare Other | Admitting: *Deleted

## 2021-01-09 ENCOUNTER — Other Ambulatory Visit: Payer: Self-pay

## 2021-01-09 DIAGNOSIS — T63441D Toxic effect of venom of bees, accidental (unintentional), subsequent encounter: Secondary | ICD-10-CM | POA: Diagnosis not present

## 2021-01-10 ENCOUNTER — Other Ambulatory Visit: Payer: Self-pay | Admitting: Family Medicine

## 2021-01-12 ENCOUNTER — Other Ambulatory Visit: Payer: Self-pay | Admitting: Physician Assistant

## 2021-01-14 DIAGNOSIS — R350 Frequency of micturition: Secondary | ICD-10-CM | POA: Diagnosis not present

## 2021-01-16 ENCOUNTER — Other Ambulatory Visit: Payer: Self-pay

## 2021-01-16 ENCOUNTER — Ambulatory Visit (INDEPENDENT_AMBULATORY_CARE_PROVIDER_SITE_OTHER): Payer: Medicare Other | Admitting: *Deleted

## 2021-01-16 DIAGNOSIS — T63441D Toxic effect of venom of bees, accidental (unintentional), subsequent encounter: Secondary | ICD-10-CM | POA: Diagnosis not present
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
## 2021-02-13 ENCOUNTER — Other Ambulatory Visit: Payer: Self-pay

## 2021-02-13 ENCOUNTER — Ambulatory Visit (INDEPENDENT_AMBULATORY_CARE_PROVIDER_SITE_OTHER): Payer: Medicare Other | Admitting: *Deleted

## 2021-02-13 DIAGNOSIS — T63441D Toxic effect of venom of bees, accidental (unintentional), subsequent encounter: Secondary | ICD-10-CM

## 2021-02-18 ENCOUNTER — Encounter: Payer: Self-pay | Admitting: Rheumatology

## 2021-02-18 ENCOUNTER — Ambulatory Visit (INDEPENDENT_AMBULATORY_CARE_PROVIDER_SITE_OTHER): Payer: Medicare Other | Admitting: Rheumatology

## 2021-02-18 ENCOUNTER — Other Ambulatory Visit: Payer: Self-pay

## 2021-02-18 VITALS — BP 135/72 | HR 61 | Ht 63.0 in | Wt 154.8 lb

## 2021-02-18 DIAGNOSIS — R7303 Prediabetes: Secondary | ICD-10-CM

## 2021-02-18 DIAGNOSIS — R2681 Unsteadiness on feet: Secondary | ICD-10-CM

## 2021-02-18 DIAGNOSIS — M5136 Other intervertebral disc degeneration, lumbar region: Secondary | ICD-10-CM

## 2021-02-18 DIAGNOSIS — Z96611 Presence of right artificial shoulder joint: Secondary | ICD-10-CM | POA: Diagnosis not present

## 2021-02-18 DIAGNOSIS — I10 Essential (primary) hypertension: Secondary | ICD-10-CM | POA: Diagnosis not present

## 2021-02-18 DIAGNOSIS — M810 Age-related osteoporosis without current pathological fracture: Secondary | ICD-10-CM

## 2021-02-18 DIAGNOSIS — M797 Fibromyalgia: Secondary | ICD-10-CM | POA: Diagnosis not present

## 2021-02-18 DIAGNOSIS — M19042 Primary osteoarthritis, left hand: Secondary | ICD-10-CM

## 2021-02-18 DIAGNOSIS — Z5181 Encounter for therapeutic drug level monitoring: Secondary | ICD-10-CM

## 2021-02-18 DIAGNOSIS — E559 Vitamin D deficiency, unspecified: Secondary | ICD-10-CM | POA: Diagnosis not present

## 2021-02-18 DIAGNOSIS — M19041 Primary osteoarthritis, right hand: Secondary | ICD-10-CM | POA: Diagnosis not present

## 2021-02-18 DIAGNOSIS — I5189 Other ill-defined heart diseases: Secondary | ICD-10-CM | POA: Diagnosis not present

## 2021-02-18 DIAGNOSIS — Z96651 Presence of right artificial knee joint: Secondary | ICD-10-CM

## 2021-02-18 NOTE — Patient Instructions (Signed)
Please return in August for labs- CMP with GFR, VitaminD

## 2021-03-06 DIAGNOSIS — R350 Frequency of micturition: Secondary | ICD-10-CM | POA: Diagnosis not present

## 2021-03-12 DIAGNOSIS — M180 Bilateral primary osteoarthritis of first carpometacarpal joints: Secondary | ICD-10-CM | POA: Diagnosis not present

## 2021-03-12 DIAGNOSIS — M79602 Pain in left arm: Secondary | ICD-10-CM | POA: Diagnosis not present

## 2021-03-13 ENCOUNTER — Ambulatory Visit: Payer: Self-pay

## 2021-03-13 ENCOUNTER — Other Ambulatory Visit: Payer: Self-pay

## 2021-03-13 ENCOUNTER — Ambulatory Visit (INDEPENDENT_AMBULATORY_CARE_PROVIDER_SITE_OTHER): Payer: Medicare Other | Admitting: *Deleted

## 2021-03-13 DIAGNOSIS — T63441D Toxic effect of venom of bees, accidental (unintentional), subsequent encounter: Secondary | ICD-10-CM

## 2021-03-25 ENCOUNTER — Ambulatory Visit
Admission: RE | Admit: 2021-03-25 | Discharge: 2021-03-25 | Disposition: A | Discharge status: Home / Self Care | Payer: Medicare Other | Source: Ambulatory Visit | Attending: Family Medicine | Admitting: Family Medicine

## 2021-03-25 ENCOUNTER — Other Ambulatory Visit: Payer: Self-pay

## 2021-03-25 DIAGNOSIS — Z1231 Encounter for screening mammogram for malignant neoplasm of breast: Secondary | ICD-10-CM | POA: Diagnosis not present

## 2021-03-27 ENCOUNTER — Ambulatory Visit (INDEPENDENT_AMBULATORY_CARE_PROVIDER_SITE_OTHER): Payer: Medicare Other | Admitting: Family Medicine

## 2021-03-27 ENCOUNTER — Encounter: Payer: Self-pay | Admitting: Family Medicine

## 2021-03-27 ENCOUNTER — Other Ambulatory Visit: Payer: Self-pay

## 2021-03-27 VITALS — BP 137/76 | HR 71 | Wt 154.0 lb

## 2021-03-27 DIAGNOSIS — N814 Uterovaginal prolapse, unspecified: Secondary | ICD-10-CM | POA: Diagnosis not present

## 2021-03-27 DIAGNOSIS — E890 Postprocedural hypothyroidism: Secondary | ICD-10-CM | POA: Diagnosis not present

## 2021-03-27 DIAGNOSIS — N3946 Mixed incontinence: Secondary | ICD-10-CM | POA: Diagnosis not present

## 2021-03-27 DIAGNOSIS — R159 Full incontinence of feces: Secondary | ICD-10-CM | POA: Diagnosis not present

## 2021-03-27 NOTE — Progress Notes (Signed)
   Subjective:    Patient ID: Sherri Murray is a 80 y.o. female presenting with prolapse  on 03/27/2021  HPI: Feels something coming out of her vagina. This has been here for at least a year.  SVD x 2 biggest baby was 8 lbs+. Has  sacral nerve stimulator implant for incontinence placed by [facility] by Dr. McDiarmid. Has lost a lot of hair and wants to see endocrinology about her thyroid.  Review of Systems  Constitutional:  Negative for chills and fever.  Respiratory:  Negative for shortness of breath.   Cardiovascular:  Negative for chest pain.  Gastrointestinal:  Negative for abdominal pain, nausea and vomiting.  Genitourinary:  Negative for dysuria.  Skin:  Negative for rash.     Objective:    BP 137/76   Pulse 71   Wt 154 lb (69.9 kg)   BMI 27.28 kg/m  Physical Exam Constitutional:      General: She is not in acute distress.    Appearance: She is well-developed.  HENT:     Head: Normocephalic and atraumatic.  Eyes:     General: No scleral icterus. Cardiovascular:     Rate and Rhythm: Normal rate.  Pulmonary:     Effort: Pulmonary effort is normal.  Abdominal:     Palpations: Abdomen is soft.  Genitourinary:    Comments: + cystocele noted, uterus with good support. No rectocele. Musculoskeletal:        General: Deformity present.     Cervical back: Neck supple.  Skin:    General: Skin is warm and dry.  Neurological:     Mental Status: She is alert and oriented to person, place, and time.        Assessment & Plan:   Problem List Items Addressed This Visit       Unprioritized   Incontinence of feces    Given concomitant cystocele and prior implant will refer to Uro/Gyn.       Relevant Orders   Ambulatory referral to Urogynecology   Post-surgical hypothyroidism - Primary    Refer to endocrinology       Relevant Orders   Ambulatory referral to Endocrinology   Mixed stress and urge urinary incontinence    Refer to uroGyn        Relevant Orders   Ambulatory referral to Urogynecology   Cystocele with prolapse    Refer to Uro/gyn. Briefly discussed pessary vs. Surgery.         Return if symptoms worsen or fail to improve.  Donnamae Jude 03/27/2021 9:37 AM

## 2021-03-27 NOTE — Assessment & Plan Note (Signed)
Refer to Uro/gyn. Briefly discussed pessary vs. Surgery.

## 2021-03-27 NOTE — Assessment & Plan Note (Signed)
Refer to [employer]

## 2021-03-27 NOTE — Assessment & Plan Note (Signed)
Given concomitant cystocele and prior implant will refer to Uro/Gyn.

## 2021-03-27 NOTE — Assessment & Plan Note (Signed)
Refer to endocrinology

## 2021-04-08 DIAGNOSIS — R350 Frequency of micturition: Secondary | ICD-10-CM | POA: Diagnosis not present

## 2021-04-10 ENCOUNTER — Ambulatory Visit (INDEPENDENT_AMBULATORY_CARE_PROVIDER_SITE_OTHER): Payer: Medicare Other | Admitting: Family Medicine

## 2021-04-10 ENCOUNTER — Other Ambulatory Visit: Payer: Self-pay

## 2021-04-10 DIAGNOSIS — T63441D Toxic effect of venom of bees, accidental (unintentional), subsequent encounter: Secondary | ICD-10-CM | POA: Diagnosis not present

## 2021-04-22 ENCOUNTER — Ambulatory Visit (INDEPENDENT_AMBULATORY_CARE_PROVIDER_SITE_OTHER): Payer: Medicare Other

## 2021-04-22 ENCOUNTER — Other Ambulatory Visit: Payer: Self-pay

## 2021-04-22 DIAGNOSIS — Z23 Encounter for immunization: Secondary | ICD-10-CM | POA: Diagnosis not present

## 2021-04-22 NOTE — Progress Notes (Signed)
   Covid-19 Vaccination Clinic  Name:  Sherri Murray    MRN: [id number] DOB: 1940/10/19  04/22/2021  [name] was observed post Covid-19 immunization for 15 minutes without incident. She was provided with Vaccine Information Sheet and instruction to access the V-Safe system.   [name] was instructed to call 911 with any severe reactions post vaccine: Difficulty breathing  Swelling of face and throat  A fast heartbeat  A bad rash all over body  Dizziness and weakness   Immunizations Administered     Name Date Dose VIS Date Route   PFIZER Comrnaty(Gray TOP) Covid-19 Vaccine 04/22/2021  2:55 PM 0.3 mL 09/19/2020 Intramuscular   Manufacturer: [place]   Lot: [id number]   [place]: [phone]

## 2021-04-29 ENCOUNTER — Other Ambulatory Visit: Payer: Self-pay

## 2021-04-29 ENCOUNTER — Ambulatory Visit (INDEPENDENT_AMBULATORY_CARE_PROVIDER_SITE_OTHER): Payer: Medicare Other | Admitting: Podiatry

## 2021-04-29 DIAGNOSIS — M79676 Pain in unspecified toe(s): Secondary | ICD-10-CM

## 2021-04-29 DIAGNOSIS — B351 Tinea unguium: Secondary | ICD-10-CM | POA: Diagnosis not present

## 2021-04-29 DIAGNOSIS — L989 Disorder of the skin and subcutaneous tissue, unspecified: Secondary | ICD-10-CM
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
## 2021-05-08 ENCOUNTER — Ambulatory Visit: Payer: Self-pay

## 2021-05-20 ENCOUNTER — Telehealth: Payer: Self-pay

## 2021-05-20 NOTE — Telephone Encounter (Signed)
Sherri Murray is a 80 y.o. female was called and contacted re: New pt Pre appt call to collect history information. Pt verified using 2 identifiers. Confirmation that I am speaking with the correct person.  Chart was updated: -Allergy -Medication -Confirm pharmacy -OB history  Pt was notified to arrive 15 min early and we will need a urine sample when she arrives. Pt verbalized understanding.

## 2021-05-22 DIAGNOSIS — R350 Frequency of micturition: Secondary | ICD-10-CM | POA: Diagnosis not present

## 2021-05-23 ENCOUNTER — Encounter: Payer: Self-pay | Admitting: Obstetrics and Gynecology

## 2021-05-23 ENCOUNTER — Other Ambulatory Visit: Payer: Self-pay

## 2021-05-23 ENCOUNTER — Ambulatory Visit (INDEPENDENT_AMBULATORY_CARE_PROVIDER_SITE_OTHER): Payer: Medicare Other | Admitting: Obstetrics and Gynecology

## 2021-05-23 VITALS — BP 132/61 | HR 85 | Ht 63.0 in | Wt 154.0 lb

## 2021-05-23 DIAGNOSIS — N8110 Cystocele, unspecified: Secondary | ICD-10-CM | POA: Diagnosis not present

## 2021-05-23 DIAGNOSIS — R159 Full incontinence of feces: Secondary | ICD-10-CM

## 2021-05-23 DIAGNOSIS — R350 Frequency of micturition: Secondary | ICD-10-CM

## 2021-05-23 DIAGNOSIS — N816 Rectocele: Secondary | ICD-10-CM | POA: Diagnosis not present

## 2021-05-23 DIAGNOSIS — N393 Stress incontinence (female) (male): Secondary | ICD-10-CM | POA: Diagnosis not present

## 2021-05-23 LAB — POCT URINALYSIS DIPSTICK
Appearance: NORMAL
Bilirubin, UA: NEGATIVE
Glucose, UA: NEGATIVE
Ketones, UA: NEGATIVE
Leukocytes, UA: NEGATIVE
Nitrite, UA: NEGATIVE
Protein, UA: NEGATIVE
Spec Grav, UA: 1.015 (ref 1.010–1.025)
Urobilinogen, UA: 0.2 E.U./dL
pH, UA: 6 (ref 5.0–8.0)

## 2021-05-23 MED ORDER — PSYLLIUM 28.3 % PO POWD: 9.0000 g | Freq: Every day | ORAL | 11 refills | Status: AC

## 2021-05-23 NOTE — Patient Instructions (Signed)
You were fit with a ring pessary. You are electing to leave it in place until your follow up visit. If it falls out you can try to put it back in or you can leave it out. If it seems to be slipping down you can use your finger to push it back in deeper - the deeper it is, the better fit.  Accidental Bowel Leakage: Our goal is to achieve formed bowel movements daily or every-other-day without leakage.  You may need to try different combinations of the following options to find what works best for you.  Some management options include: Dietary changes (more leafy greens, vegetables and fruits; less processed foods) Fiber supplementation (Metamucil or something with psyllium as active ingredient) Over-the-counter imodium (tablets or liquid) to help solidify the stool and prevent leakage of stool.
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
## 2021-05-26 ENCOUNTER — Other Ambulatory Visit: Payer: Self-pay

## 2021-05-26 ENCOUNTER — Ambulatory Visit (INDEPENDENT_AMBULATORY_CARE_PROVIDER_SITE_OTHER): Payer: Medicare Other

## 2021-05-26 DIAGNOSIS — T63441D Toxic effect of venom of bees, accidental (unintentional), subsequent encounter: Secondary | ICD-10-CM

## 2021-05-26 NOTE — Progress Notes (Deleted)
[facility]   Subjective:     Chief Complaint: No chief complaint on file.  History of Present Illness: Daveigh Kimbel is a 80 y.o. female with stage III pelvic organ prolapse who presents for a pessary check. She is using a size #3 ring with support pessary. The pessary has been working well and she has no complaints. She {ACTION; IS/IS GI:087931 using vaginal estrogen. She denies vaginal bleeding.  Last visit had incomplete bladder emptying with PVR of 25m. Currently has an interstim in place for OAB and takes Myrbetriq and oxybutynin.   Past Medical History: Patient  has a past medical history of Acute ischemic colitis ([place] (02/26/2012), Acute posthemorrhagic anemia, Arthritis, Asthma, Chronic diarrhea, Chronic foot pain, Complication of anesthesia, Difficult intubation, Fecal incontinence, Fibromyalgia, GERD (gastroesophageal reflux disease), Headache, Heart murmur, Hyperlipidemia, Hypertriglyceridemia (02/2012), IBS (irritable bowel syndrome), Incontinence of urine, Internal hemorrhoids, Ischemic colitis ([place] (02/2012), Sleep apnea, and Thyroid goiter.   Past Surgical History: She  has a past surgical history that includes Thyroid surgery; Tonsillectomy; Tubal ligation; Cataract extraction (Bilateral); Myomectomy; Colonoscopy (02/27/2012); liver biopsy (1980); Knee Arthroplasty (Right, 12/11/2015); Sacral nerve stimulator placement; Eye surgery; and Total shoulder arthroplasty (Right, 02/05/2017).   Medications: She has a current medication list which includes the following prescription(s): acetaminophen, alendronate, atorvastatin, biotin, calcium carbonate-vitamin d, vitamin d, dicyclomine, diphenhydramine-acetaminophen, epinephrine, folic acid, glucosamine-chondroitin, loperamide, loratadine, losartan, melatonin, meloxicam, centrum silver, nystatin cream, olopatadine hcl, oxybutynin, pantoprazole, and psyllium.   Allergies: Patient is allergic to apple,  banana, barium-containing compounds, bee venom, celery oil, iodinated diagnostic agents, ioxaglate, metrizamide, other, penicillins, strawberry extract, aspirin, barium sulfate, latex, licorice [glycyrrhiza], sulfa antibiotics, and etodolac.   Social History: Patient  reports that she has never smoked. She has never used smokeless tobacco. She reports current alcohol use of about 1.0 standard drink per week. She reports that she does not use drugs.      Objective:    Physical Exam: There were no vitals taken for this visit. Gen: No apparent distress, A&O x 3. Detailed Urogynecologic Evaluation:  Pelvic Exam: Normal external female genitalia; Bartholin's and Skene's glands normal in appearance; urethral meatus {urethra:24773}, no urethral masses or discharge. The pessary was noted to be {in place:24774}. It was removed and cleaned. Speculum exam revealed {vaginal lesions:24775} in the vagina. The pessary was replaced. It was comfortable to the patient and fit well.   No flowsheet data found.  Laboratory Results: Urine dipstick shows: {ua dip:315374::"negative for all components"}.    Assessment/Plan:    Assessment: Ms. BTougasis a 80y.o. with {PFD symptoms:24771} here for a pessary check. She is doing well.  Plan: She will {pessary plan:24776}. She will continue to use {lubricant:24777}. She will follow-up in *** {days/wks/mos/yrs:310907} for a pessary check or sooner as needed.  All questions were answered.   Time Spent:

## 2021-05-27 ENCOUNTER — Ambulatory Visit: Payer: Medicare Other | Admitting: Obstetrics and Gynecology

## 2021-06-03 ENCOUNTER — Other Ambulatory Visit: Payer: Self-pay

## 2021-06-03 ENCOUNTER — Ambulatory Visit (INDEPENDENT_AMBULATORY_CARE_PROVIDER_SITE_OTHER): Payer: Medicare Other | Admitting: Obstetrics and Gynecology

## 2021-06-03 ENCOUNTER — Encounter: Payer: Self-pay | Admitting: Obstetrics and Gynecology

## 2021-06-03 VITALS — BP 130/74 | HR 61 | Wt 154.0 lb

## 2021-06-03 DIAGNOSIS — N816 Rectocele: Secondary | ICD-10-CM

## 2021-06-03 DIAGNOSIS — N952 Postmenopausal atrophic vaginitis: Secondary | ICD-10-CM | POA: Diagnosis not present

## 2021-06-03 DIAGNOSIS — N8110 Cystocele, unspecified: Secondary | ICD-10-CM

## 2021-06-03 DIAGNOSIS — R159 Full incontinence of feces: Secondary | ICD-10-CM

## 2021-06-03 DIAGNOSIS — N3281 Overactive bladder: Secondary | ICD-10-CM

## 2021-06-03 MED ORDER — ESTRADIOL 0.1 MG/GM VA CREA: 0.5000 g | TOPICAL_CREAM | VAGINAL | 11 refills | Status: DC

## 2021-06-03 NOTE — Patient Instructions (Signed)
Start vaginal estrogen therapy nightly for two weeks then 2 times weekly at night for treatment of vaginal atrophy (dryness of the vaginal tissues).  Please let us know if the prescription is too expensive and we can look for alternative options.   Use coconut oil in between for vaginal moisture.

## 2021-06-03 NOTE — Progress Notes (Signed)
[facility]   Subjective:     Chief Complaint:  Chief Complaint  Patient presents with   Follow-up    PVR- 76   History of Present Illness: Jonnay Mandracchia is a 80 y.o. female with stage III pelvic organ prolapse who presents for a pessary check. She is using a size #3 ring with support pessary. The pessary has been working well and she has no complaints. She is not using vaginal estrogen. Feels irritated on the vulvar skin from leaking. She denies vaginal bleeding.  Past Medical History: Patient  has a past medical history of Acute ischemic colitis ([place]) (02/26/2012), Acute posthemorrhagic anemia, Arthritis, Asthma, Chronic diarrhea, Chronic foot pain, Complication of anesthesia, Difficult intubation, Fecal incontinence, Fibromyalgia, GERD (gastroesophageal reflux disease), Headache, Heart murmur, Hyperlipidemia, Hypertriglyceridemia (02/2012), IBS (irritable bowel syndrome), Incontinence of urine, Internal hemorrhoids, Ischemic colitis ([place]) (02/2012), Sleep apnea, and Thyroid goiter.   Past Surgical History: She  has a past surgical history that includes Thyroid surgery; Tonsillectomy; Tubal ligation; Cataract extraction (Bilateral); Myomectomy; Colonoscopy (02/27/2012); liver biopsy (1980); Knee Arthroplasty (Right, 12/11/2015); Sacral nerve stimulator placement; Eye surgery; and Total shoulder arthroplasty (Right, 02/05/2017).   Medications: She has a current medication list which includes the following prescription(s): acetaminophen, alendronate, atorvastatin, biotin, calcium carbonate-vitamin d, vitamin d, dicyclomine, diphenhydramine-acetaminophen, epinephrine, folic acid, glucosamine-chondroitin, loperamide, loratadine, losartan, melatonin, meloxicam, centrum silver, nystatin cream, olopatadine hcl, oxybutynin, pantoprazole, and psyllium.   Allergies: Patient is allergic to apple, banana, barium-containing compounds, bee venom, celery oil, iodinated diagnostic  agents, ioxaglate, metrizamide, other, penicillins, strawberry extract, aspirin, barium sulfate, latex, licorice [glycyrrhiza], sulfa antibiotics, and etodolac.   Social History: Patient  reports that she has never smoked. She has never used smokeless tobacco. She reports current alcohol use of about 1.0 standard drink per week. She reports that she does not use drugs.      Objective:    Physical Exam: BP 130/74   Pulse 61   Wt 154 lb (69.9 kg)   BMI 27.28 kg/m  Gen: No apparent distress, A&O x 3. Detailed Urogynecologic Evaluation:  Pelvic Exam: Normal external female genitalia, erythema noted on external vulva; Bartholin's and Skene's glands normal in appearance; urethral meatus normal in appearance, no urethral masses or discharge. The pessary was noted to be in place. It was removed and cleaned. Speculum exam revealed no lesions in the vagina. The pessary was replaced. It was comfortable to the patient and fit well.  POP-Q:    POP-Q   2                                            Aa   2                                           Ba   -6.5                                              C    4  Gh   3                                            Pb   8                                            tvl    -1                                            Ap   -1                                            Bp   -6                                              D     PVR: After voiding, post void residual obtained with bladder scanner to assess emptying: PVR - 54m.   Assessment/Plan:    Assessment: Ms. BMeeleris a 80y.o. with stage III pelvic organ prolapse and OAB here for a pessary check. She is doing well.  Plan: POP - continue #3 ring with support pessary - She has had improved bladder emptying with pessary in place  2. OAB - Has interstim and taking myrbetriq and oxybutynin. Managed by [facility]  3. Fecal  incontinence - Has not yet started psyllium fiber supplement prescribed at last visit. She will call pharmacy to obtain prescription.   4. Vaginal atrophy - Prescribed estrace cream 0.5 g to use nightly for two weeks then twice a week after. She can use coconut oil for moisture in between estrogen use.   Return 3 months  MJaquita Folds MD   Time Spent: Time spent: I spent 25 minutes dedicated to the care of this patient on the date of this encounter to include pre-visit review of records, face-to-face time with the patient  and post visit documentation and ordering medication/ testing.

## 2021-06-06 DIAGNOSIS — R311 Benign essential microscopic hematuria: Secondary | ICD-10-CM | POA: Diagnosis not present

## 2021-06-11 DIAGNOSIS — M79642 Pain in left hand: Secondary | ICD-10-CM | POA: Diagnosis not present

## 2021-06-11 DIAGNOSIS — M180 Bilateral primary osteoarthritis of first carpometacarpal joints: Secondary | ICD-10-CM | POA: Diagnosis not present

## 2021-06-11 DIAGNOSIS — M19042 Primary osteoarthritis, left hand: Secondary | ICD-10-CM | POA: Diagnosis not present

## 2021-06-11 DIAGNOSIS — M1812 Unilateral primary osteoarthritis of first carpometacarpal joint, left hand: Secondary | ICD-10-CM | POA: Diagnosis not present

## 2021-06-14 ENCOUNTER — Other Ambulatory Visit: Payer: Self-pay | Admitting: Allergy & Immunology

## 2021-06-23 ENCOUNTER — Ambulatory Visit: Payer: Medicare Other

## 2021-06-27 ENCOUNTER — Ambulatory Visit (INDEPENDENT_AMBULATORY_CARE_PROVIDER_SITE_OTHER): Payer: Medicare Other

## 2021-06-27 ENCOUNTER — Other Ambulatory Visit: Payer: Self-pay

## 2021-06-27 DIAGNOSIS — T63441D Toxic effect of venom of bees, accidental (unintentional), subsequent encounter: Secondary | ICD-10-CM | POA: Diagnosis not present

## 2021-07-10 IMAGING — CT CT ABD-PELV W/ CM
2 of 5 series · 16 of 46 positions shown, 18 images · IV contrast (OMNIPAQUE)
Comparison: 02/26/2012

CLINICAL DATA: Worsening left lower quadrant pain for several
years.

EXAM:
CT ABDOMEN AND PELVIS WITH CONTRAST
TECHNIQUE: Multidetector CT imaging of the abdomen and pelvis was performed
using the standard protocol following bolus administration of
intravenous contrast.
CONTRAST:  100mL OMNIPAQUE IOHEXOL 300 MG/ML  SOLN

[Series 2: axial st · axial · 0.92mm/px · z∈[-470,-115]mm · 13 of 83 slices shown, 15 images]
[im 6/83  soft-tissue]
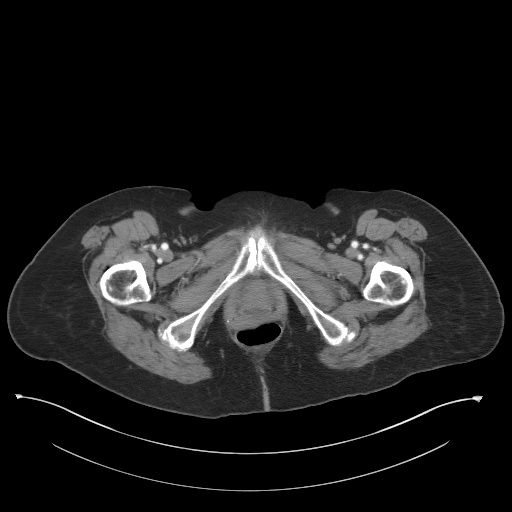
[im 6/83  bone]
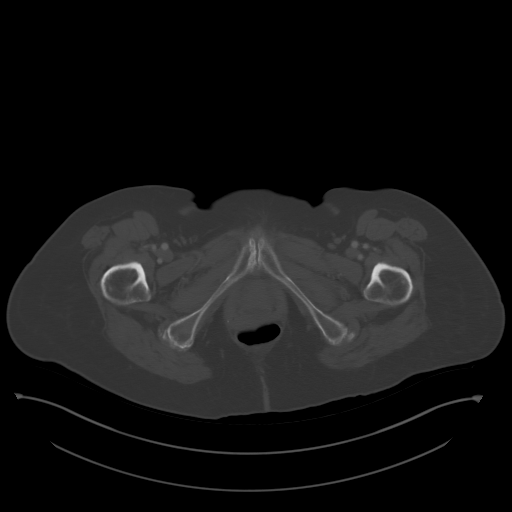
[im 12/83  soft-tissue]
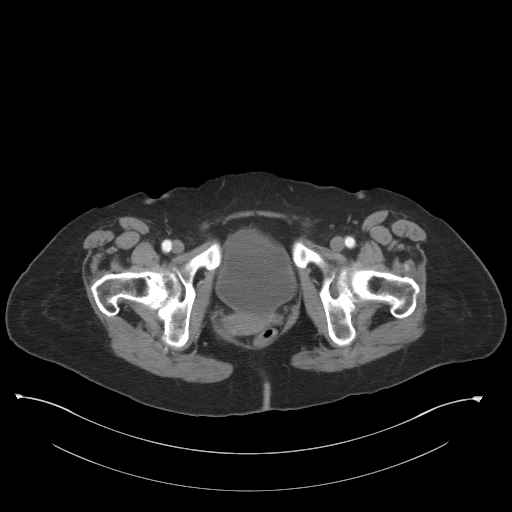
[im 18/83  soft-tissue]
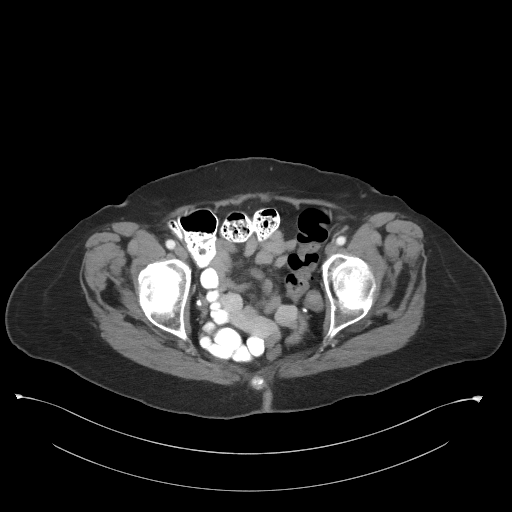
[im 24/83  soft-tissue]
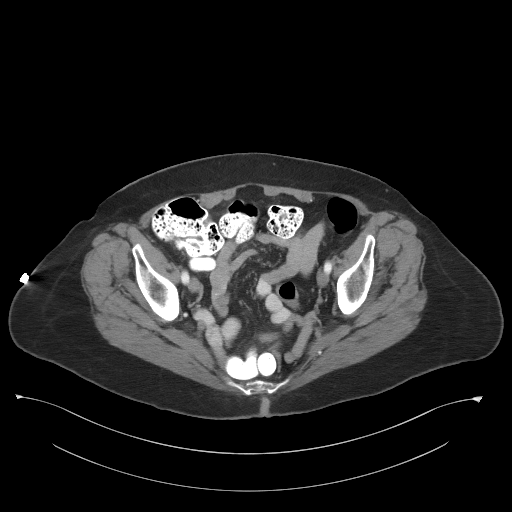
[im 30/83  soft-tissue]
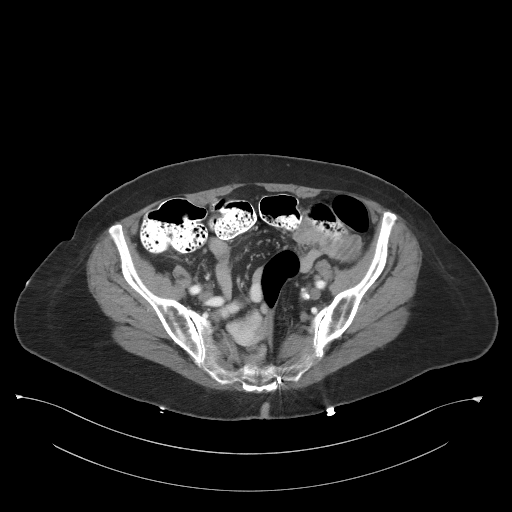
[im 36/83  soft-tissue]
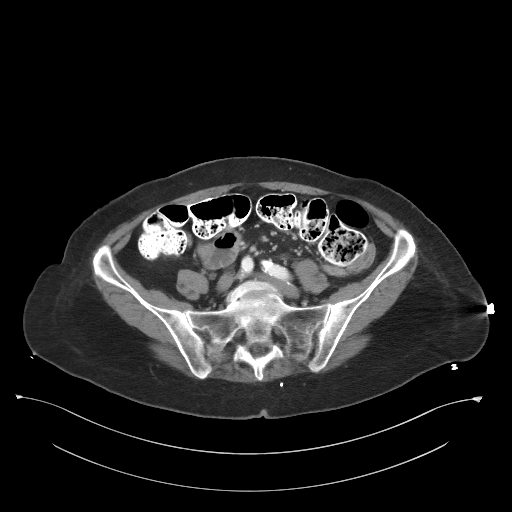
[im 42/83  soft-tissue]
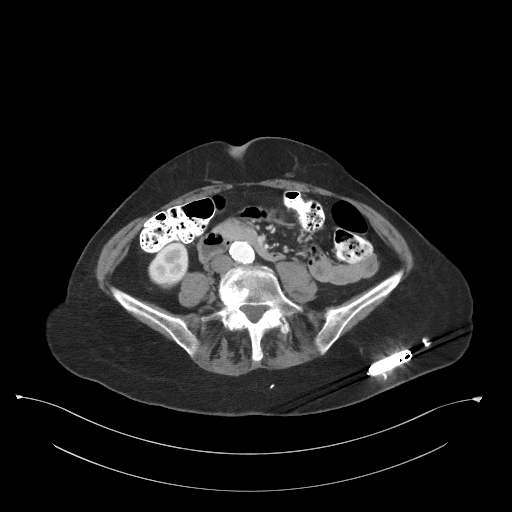
[im 47/83  soft-tissue]
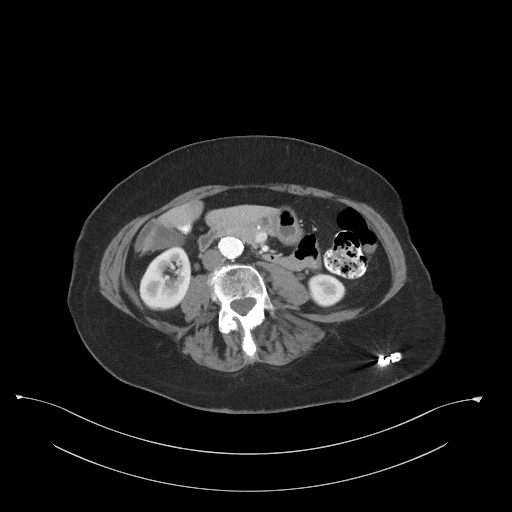
[im 53/83  soft-tissue]
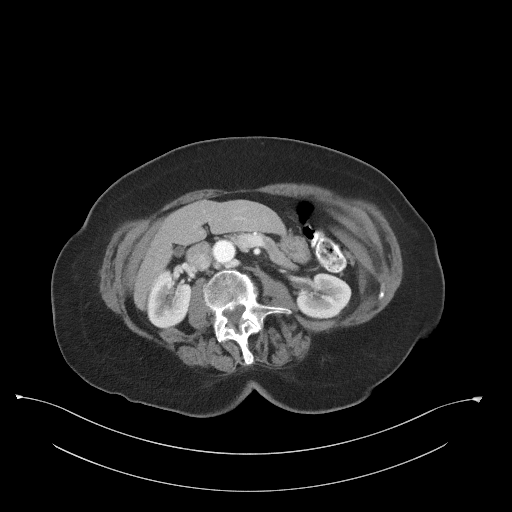
[im 53/83  bone]
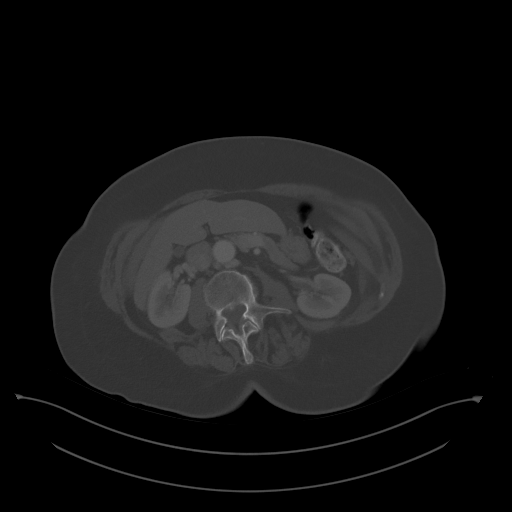
[im 59/83  soft-tissue]
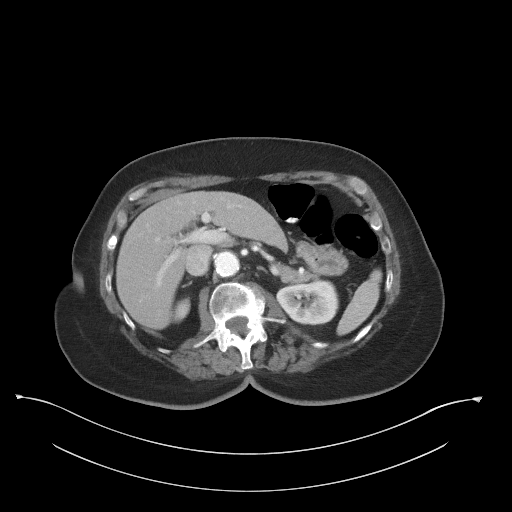
[im 65/83  soft-tissue]
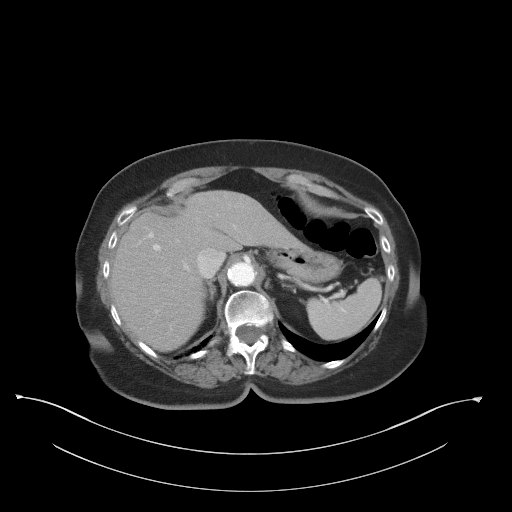
[im 71/83  soft-tissue]
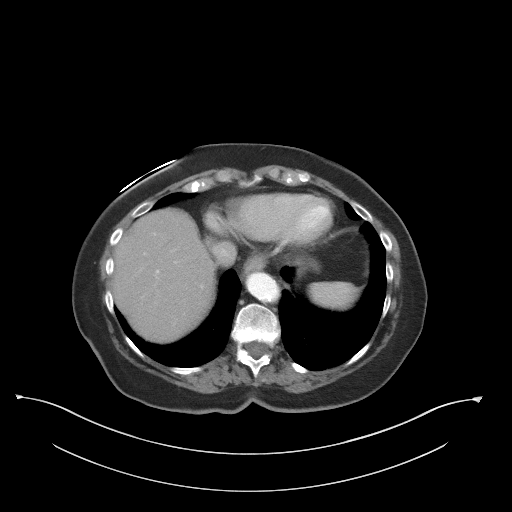
[im 77/83  soft-tissue]
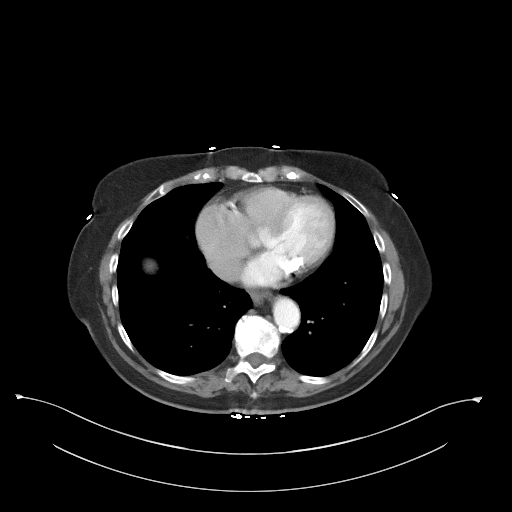

[Series 4: coronal st · coronal · 0.89mm/px · 3 of 74 slices shown]
[im 25/74  soft-tissue]
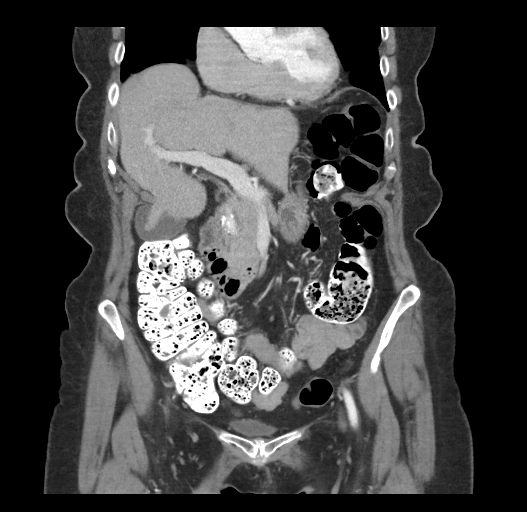
[im 33/74  soft-tissue]
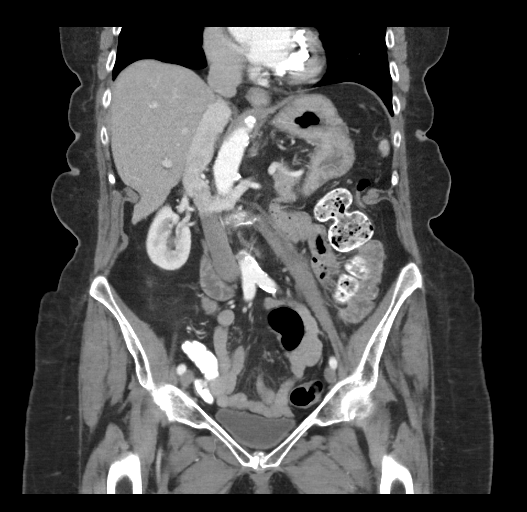
[im 41/74  soft-tissue]
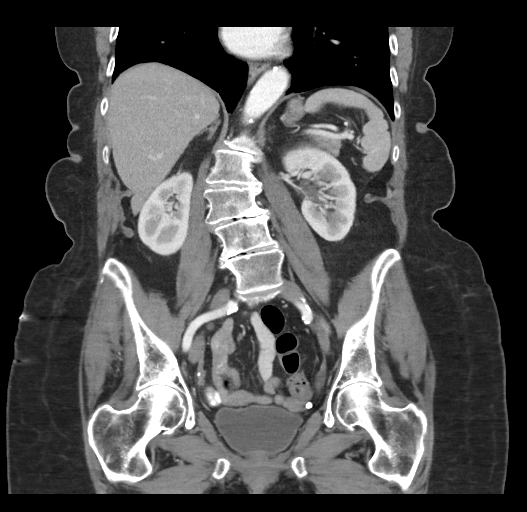

[16 of 46 positions shown; findings below may reference images not displayed]

FINDINGS: Lower Chest: No acute findings.

Hepatobiliary: No hepatic masses identified. Gallbladder is
unremarkable. No evidence of biliary ductal dilatation.

Pancreas:  No mass or inflammatory changes.

Spleen: Within normal limits in size and appearance.

Adrenals/Urinary Tract: No masses identified. No evidence of
ureteral calculi or hydronephrosis.

Stomach/Bowel: No evidence of obstruction, inflammatory process or
abnormal fluid collections.

Vascular/Lymphatic: No pathologically enlarged lymph nodes. No
abdominal aortic aneurysm. Aortic atherosclerotic calcification
noted.

Reproductive:  No mass or other significant abnormality.

Other:  None.

Musculoskeletal:  No suspicious bone lesions identified.
IMPRESSION: No acute findings or other significant abnormality.

Aortic Atherosclerosis ([id number]).

## 2021-07-25 ENCOUNTER — Ambulatory Visit: Payer: Medicare Other

## 2021-08-05 ENCOUNTER — Ambulatory Visit (INDEPENDENT_AMBULATORY_CARE_PROVIDER_SITE_OTHER): Payer: Medicare Other | Admitting: Podiatry

## 2021-08-05 ENCOUNTER — Encounter (INDEPENDENT_AMBULATORY_CARE_PROVIDER_SITE_OTHER): Payer: Self-pay

## 2021-08-05 ENCOUNTER — Other Ambulatory Visit: Payer: Self-pay

## 2021-08-05 DIAGNOSIS — B351 Tinea unguium: Secondary | ICD-10-CM

## 2021-08-05 DIAGNOSIS — L989 Disorder of the skin and subcutaneous tissue, unspecified: Secondary | ICD-10-CM | POA: Diagnosis not present

## 2021-08-05 DIAGNOSIS — M79676 Pain in unspecified toe(s): Secondary | ICD-10-CM

## 2021-08-05 DIAGNOSIS — M7752 Other enthesopathy of left foot: Secondary | ICD-10-CM
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
## 2021-08-14 ENCOUNTER — Ambulatory Visit: Payer: Medicare Other

## 2021-08-17 DIAGNOSIS — L989 Disorder of the skin and subcutaneous tissue, unspecified: Secondary | ICD-10-CM | POA: Diagnosis not present

## 2021-08-17 DIAGNOSIS — M7752 Other enthesopathy of left foot: Secondary | ICD-10-CM | POA: Diagnosis not present

## 2021-08-17 MED ORDER — BETAMETHASONE SOD PHOS & ACET 6 (3-3) MG/ML IJ SUSP
3.0000 mg | Freq: Once | INTRAMUSCULAR | Status: AC
  Administered 2021-08-17: 3 mg via INTRA_ARTICULAR
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
## 2021-08-20 ENCOUNTER — Encounter: Payer: Self-pay | Admitting: Physician Assistant

## 2021-08-20 ENCOUNTER — Ambulatory Visit (INDEPENDENT_AMBULATORY_CARE_PROVIDER_SITE_OTHER): Payer: Medicare Other | Admitting: Physician Assistant

## 2021-08-20 ENCOUNTER — Other Ambulatory Visit: Payer: Self-pay

## 2021-08-20 VITALS — BP 129/66 | HR 66 | Resp 17 | Ht 63.0 in | Wt 152.0 lb

## 2021-08-20 DIAGNOSIS — M51369 Other intervertebral disc degeneration, lumbar region without mention of lumbar back pain or lower extremity pain: Secondary | ICD-10-CM

## 2021-08-20 DIAGNOSIS — M797 Fibromyalgia: Secondary | ICD-10-CM | POA: Diagnosis not present

## 2021-08-20 DIAGNOSIS — I5189 Other ill-defined heart diseases: Secondary | ICD-10-CM | POA: Diagnosis not present

## 2021-08-20 DIAGNOSIS — R7303 Prediabetes: Secondary | ICD-10-CM

## 2021-08-20 DIAGNOSIS — E559 Vitamin D deficiency, unspecified: Secondary | ICD-10-CM

## 2021-08-20 DIAGNOSIS — M19041 Primary osteoarthritis, right hand: Secondary | ICD-10-CM

## 2021-08-20 DIAGNOSIS — Z96651 Presence of right artificial knee joint: Secondary | ICD-10-CM

## 2021-08-20 DIAGNOSIS — M5136 Other intervertebral disc degeneration, lumbar region: Secondary | ICD-10-CM

## 2021-08-20 DIAGNOSIS — Z96611 Presence of right artificial shoulder joint: Secondary | ICD-10-CM

## 2021-08-20 DIAGNOSIS — M810 Age-related osteoporosis without current pathological fracture: Secondary | ICD-10-CM

## 2021-08-20 DIAGNOSIS — I10 Essential (primary) hypertension: Secondary | ICD-10-CM | POA: Diagnosis not present

## 2021-08-20 DIAGNOSIS — Z5181 Encounter for therapeutic drug level monitoring: Secondary | ICD-10-CM

## 2021-08-20 DIAGNOSIS — R2681 Unsteadiness on feet: Secondary | ICD-10-CM | POA: Diagnosis not present

## 2021-08-20 DIAGNOSIS — M19042 Primary osteoarthritis, left hand: Secondary | ICD-10-CM

## 2021-08-21 ENCOUNTER — Ambulatory Visit: Payer: Medicare Other

## 2021-08-21 LAB — COMPLETE METABOLIC PANEL WITH GFR
AG Ratio: 1.5 (calc) (ref 1.0–2.5)
ALT: 14 U/L (ref 6–29)
AST: 22 U/L (ref 10–35)
Albumin: 4.1 g/dL (ref 3.6–5.1)
Alkaline phosphatase (APISO): 50 U/L (ref 37–153)
BUN: 10 mg/dL (ref 7–25)
CO2: 25 mmol/L (ref 20–32)
Calcium: 9.5 mg/dL (ref 8.6–10.4)
Chloride: 95 mmol/L — ABNORMAL LOW (ref 98–110)
Creat: 0.65 mg/dL (ref 0.60–0.95)
Globulin: 2.7 g/dL (calc) (ref 1.9–3.7)
Glucose, Bld: 90 mg/dL (ref 65–99)
Potassium: 4.5 mmol/L (ref 3.5–5.3)
Sodium: 133 mmol/L — ABNORMAL LOW (ref 135–146)
Total Bilirubin: 0.4 mg/dL (ref 0.2–1.2)
Total Protein: 6.8 g/dL (ref 6.1–8.1)
eGFR: 88 mL/min/{1.73_m2} (ref >=60)

## 2021-08-21 LAB — VITAMIN D 25 HYDROXY (VIT D DEFICIENCY, FRACTURES): Vit D, 25-Hydroxy: 50 ng/mL (ref 30–100)

## 2021-08-21 NOTE — Progress Notes (Signed)
Sodium and chloride are slightly low.  Rest of CMP WNL. Calcium is WNL.  Vitamin D is WNL.  Ok to continue on current vitamin D and calcium supplements.

## 2021-08-27 ENCOUNTER — Other Ambulatory Visit: Payer: Self-pay | Admitting: Podiatry

## 2021-08-27 ENCOUNTER — Ambulatory Visit (INDEPENDENT_AMBULATORY_CARE_PROVIDER_SITE_OTHER): Payer: Medicare Other | Admitting: Obstetrics and Gynecology

## 2021-08-27 ENCOUNTER — Encounter: Payer: Self-pay | Admitting: Obstetrics and Gynecology

## 2021-08-27 ENCOUNTER — Other Ambulatory Visit: Payer: Self-pay

## 2021-08-27 VITALS — BP 154/84 | HR 73 | Wt 152.0 lb

## 2021-08-27 DIAGNOSIS — N8110 Cystocele, unspecified: Secondary | ICD-10-CM

## 2021-08-27 DIAGNOSIS — N816 Rectocele: Secondary | ICD-10-CM | POA: Diagnosis not present

## 2021-08-27 DIAGNOSIS — R159 Full incontinence of feces: Secondary | ICD-10-CM

## 2021-08-27 DIAGNOSIS — N952 Postmenopausal atrophic vaginitis: Secondary | ICD-10-CM

## 2021-08-27 NOTE — Progress Notes (Signed)
[facility]   Subjective:     Chief Complaint:  Chief Complaint  Patient presents with   Pessary Check    History of Present Illness: Sherri Murray is a 80 y.o. female with stage III pelvic organ prolapse who presents for a pessary check. She is using a size #3 ring with support pessary. The pessary has been working well. She is using vaginal estrogen twice a week.   Has been taking the psyllium but makes her go more often so has to be careful how much she uses. She reports she has been eating soft foods (like beans) recently due to teeth being pulled so this has caused some more bowel leakage.   Past Medical History: Patient  has a past medical history of Acute ischemic colitis ([place]) (02/26/2012), Acute posthemorrhagic anemia, Arthritis, Asthma, Chronic diarrhea, Chronic foot pain, Complication of anesthesia, Difficult intubation, Fecal incontinence, Fibromyalgia, GERD (gastroesophageal reflux disease), Headache, Heart murmur, Hyperlipidemia, Hypertriglyceridemia (02/2012), IBS (irritable bowel syndrome), Incontinence of urine, Internal hemorrhoids, Ischemic colitis ([place]) (02/2012), Sleep apnea, and Thyroid goiter.   Past Surgical History: She  has a past surgical history that includes Thyroid surgery; Tonsillectomy; Tubal ligation; Cataract extraction (Bilateral); Myomectomy; Colonoscopy (02/27/2012); liver biopsy (1980); Knee Arthroplasty (Right, 12/11/2015); Sacral nerve stimulator placement; Eye surgery; Total shoulder arthroplasty (Right, 02/05/2017); and Tooth extraction.   Medications: She has a current medication list which includes the following prescription(s): acetaminophen, alendronate, atorvastatin, biotin, calcium carbonate-vitamin d, vitamin d, dicyclomine, diphenhydramine-acetaminophen, epinephrine, estradiol, folic acid, glucosamine-chondroitin, loperamide, loratadine, losartan, melatonin, meloxicam, centrum silver, nystatin cream, olopatadine hcl,  oxybutynin, and pantoprazole.   Allergies: Patient is allergic to apple, banana, barium-containing compounds, bee venom, celery oil, iodinated diagnostic agents, ioxaglate, metrizamide, other, penicillins, strawberry extract, aspirin, barium sulfate, latex, licorice [glycyrrhiza], sulfa antibiotics, and etodolac.   Social History: Patient  reports that she has never smoked. She has never used smokeless tobacco. She reports that she does not currently use alcohol. She reports that she does not use drugs.      Objective:    Physical Exam: BP (!) 154/84   Pulse 73   Wt 152 lb (68.9 kg)   BMI 26.93 kg/m  Gen: No apparent distress, A&O x 3. Detailed Urogynecologic Evaluation:  Pelvic Exam: Normal external female genitalia, erythema noted on external vulva; Bartholin's and Skene's glands normal in appearance; urethral meatus normal in appearance, no urethral masses or discharge. The pessary was noted to be in place. It was removed and cleaned. Speculum exam revealed no lesions in the vagina. The pessary was replaced. It was comfortable to the patient and fit well.   POP-Q:    POP-Q   2                                            Aa   2                                           Ba   -6.5                                              C  4                                            Gh   3                                            Pb   8                                            tvl    -1                                            Ap   -1                                            Bp   -6                                              D      Assessment/Plan:    Assessment: [name] is a 80 y.o. with stage III pelvic organ prolapse and OAB here for a pessary check. She is doing well.  Plan: POP - continue #3 ring with support pessary  2. OAB - Has interstim and taking myrbetriq and oxybutynin. Managed by [facility]. She is out of the myrbetriq and advised to  contact their office.   3. Fecal incontinence - Continue with psyllium- 1tsp per day  4. Vaginal atrophy - Continue estrace cream 0.5 g to use twice a week at night  Return 3 months for pessary check  [name], MD   Time Spent: Time spent: I spent 20 minutes dedicated to the care of this patient on the date of this encounter to include pre-visit review of records, face-to-face time with the patient  and post visit documentation and ordering medication/ testing.

## 2021-08-27 NOTE — Patient Instructions (Signed)
Continue to place estrogen cream twice a week at night.

## 2021-08-28 NOTE — Telephone Encounter (Signed)
Please advise 

## 2021-09-01 ENCOUNTER — Other Ambulatory Visit: Payer: Self-pay | Admitting: Podiatry

## 2021-09-09 ENCOUNTER — Other Ambulatory Visit: Payer: Self-pay | Admitting: Family Medicine

## 2021-09-09 NOTE — Telephone Encounter (Signed)
Last office visit 11/22/2020 for CPE.  Last refilled 09/04/2020 for #[id number] with no refills.  No future appointments with PCP.  Please schedule 40 minute CPE with fasting labs prior for sometime after 11/17/2021 with Dr. Diona Browner.  She is already scheduled for her [place] with nurse.

## 2021-09-09 NOTE — Telephone Encounter (Signed)
Called Mrs. Phillis and got her scheduled for 11/21/21

## 2021-09-26 ENCOUNTER — Telehealth: Payer: Self-pay

## 2021-09-26 NOTE — Telephone Encounter (Signed)
Sherri Murray asked that I send a telephone encounter due to her being back up in injection room. Patient called wanting to restart her venom injections. Her last venom injection was 06/27/2021, she received 1 ml and is on maintenance at every 4 weeks.  Patient is also interested in receiving her Flu and Covid vaccines in office. Please advise

## 2021-09-29 NOTE — Telephone Encounter (Signed)
Let's decrease venom to 0.25 mL and increase 0.37mL weekly until back at the maintenance. Then space out to monthly again.   We can do the COVID vaccines, but obviously not at the same time as the venom.   I do not believe that we have the high dose flu vaccines to give in the office to Medicare patients, but correct me if I am wrong.   If she is insistent on getting it in the office, we could try to get it from [employer]?   Salvatore Marvel, MD [facility] [place]

## 2021-09-29 NOTE — Telephone Encounter (Signed)
Spoke with patient, informed her of Dr. Gillermina Hu recommendations. Patient verbalized understanding. [facility] is going to [facility] get [facility]. She is coming in on Thursday to get her Venom and will schedule an appointment to get the Covid vaccine.

## 2021-10-02 ENCOUNTER — Ambulatory Visit: Payer: Medicare Other

## 2021-10-08 ENCOUNTER — Ambulatory Visit (INDEPENDENT_AMBULATORY_CARE_PROVIDER_SITE_OTHER): Payer: Medicare Other

## 2021-10-08 ENCOUNTER — Other Ambulatory Visit: Payer: Self-pay

## 2021-10-08 DIAGNOSIS — T63441D Toxic effect of venom of bees, accidental (unintentional), subsequent encounter: Secondary | ICD-10-CM

## 2021-10-16 ENCOUNTER — Ambulatory Visit: Payer: Medicare Other

## 2021-10-17 ENCOUNTER — Ambulatory Visit (INDEPENDENT_AMBULATORY_CARE_PROVIDER_SITE_OTHER): Payer: Commercial Managed Care - HMO

## 2021-10-17 ENCOUNTER — Other Ambulatory Visit: Payer: Self-pay

## 2021-10-17 DIAGNOSIS — T63441D Toxic effect of venom of bees, accidental (unintentional), subsequent encounter: Secondary | ICD-10-CM

## 2021-10-24 ENCOUNTER — Ambulatory Visit (INDEPENDENT_AMBULATORY_CARE_PROVIDER_SITE_OTHER): Payer: Commercial Managed Care - HMO

## 2021-10-24 ENCOUNTER — Other Ambulatory Visit: Payer: Self-pay

## 2021-10-24 DIAGNOSIS — T63441D Toxic effect of venom of bees, accidental (unintentional), subsequent encounter: Secondary | ICD-10-CM | POA: Diagnosis not present

## 2021-10-28 ENCOUNTER — Ambulatory Visit (INDEPENDENT_AMBULATORY_CARE_PROVIDER_SITE_OTHER): Payer: Commercial Managed Care - HMO

## 2021-10-28 ENCOUNTER — Other Ambulatory Visit: Payer: Self-pay

## 2021-10-28 DIAGNOSIS — Z23 Encounter for immunization: Secondary | ICD-10-CM

## 2021-10-28 NOTE — Progress Notes (Signed)
° °  Covid-19 Vaccination Clinic  Name:  Sherri Murray    MRN: [id number] DOB: 1941-04-03  10/28/2021  [name] was observed post Covid-19 immunization for 15 minutes without incident. She was provided with Vaccine Information Sheet and instruction to access the V-Safe system.   [name] was instructed to call 911 with any severe reactions post vaccine: Difficulty breathing  Swelling of face and throat  A fast heartbeat  A bad rash all over body  Dizziness and weakness   Immunizations Administered     Name Date Dose VIS Date Route   Pfizer Covid-19 Vaccine Bivalent Booster 10/28/2021 11:15 AM 0.3 mL 06/11/2021 Intramuscular   Manufacturer: [place]   Lot: [id number]   [place]: [phone]

## 2021-10-31 ENCOUNTER — Other Ambulatory Visit: Payer: Self-pay

## 2021-10-31 ENCOUNTER — Ambulatory Visit (INDEPENDENT_AMBULATORY_CARE_PROVIDER_SITE_OTHER): Payer: Commercial Managed Care - HMO

## 2021-10-31 DIAGNOSIS — T63441D Toxic effect of venom of bees, accidental (unintentional), subsequent encounter: Secondary | ICD-10-CM

## 2021-11-11 ENCOUNTER — Ambulatory Visit (INDEPENDENT_AMBULATORY_CARE_PROVIDER_SITE_OTHER): Payer: Medicare Other | Admitting: Podiatry

## 2021-11-11 ENCOUNTER — Encounter: Payer: Self-pay | Admitting: Podiatry

## 2021-11-11 ENCOUNTER — Other Ambulatory Visit: Payer: Self-pay

## 2021-11-11 DIAGNOSIS — L989 Disorder of the skin and subcutaneous tissue, unspecified: Secondary | ICD-10-CM | POA: Diagnosis not present

## 2021-11-11 DIAGNOSIS — M79676 Pain in unspecified toe(s): Secondary | ICD-10-CM | POA: Diagnosis not present

## 2021-11-11 DIAGNOSIS — B351 Tinea unguium: Secondary | ICD-10-CM | POA: Diagnosis not present
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
## 2021-11-14 ENCOUNTER — Other Ambulatory Visit: Payer: Self-pay

## 2021-11-14 ENCOUNTER — Ambulatory Visit (INDEPENDENT_AMBULATORY_CARE_PROVIDER_SITE_OTHER): Payer: Medicare Other | Admitting: *Deleted

## 2021-11-14 DIAGNOSIS — T63441D Toxic effect of venom of bees, accidental (unintentional), subsequent encounter: Secondary | ICD-10-CM
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
## 2021-11-17 ENCOUNTER — Ambulatory Visit (INDEPENDENT_AMBULATORY_CARE_PROVIDER_SITE_OTHER): Payer: Medicare Other

## 2021-11-17 VITALS — Ht 63.0 in | Wt 152.0 lb

## 2021-11-17 DIAGNOSIS — Z0000 Encounter for general adult medical examination without abnormal findings: Secondary | ICD-10-CM

## 2021-11-17 NOTE — Patient Instructions (Signed)
Sherri Murray , Thank you for taking time to complete your Medicare Wellness Visit. I appreciate your ongoing commitment to your health goals. Please review the following plan we discussed and let me know if I can assist you in the future.   Screening recommendations/referrals: Colonoscopy: no longer required  Mammogram: up to date, completed 03/25/21, due 03/25/22 Bone Density: due, last completed 09/12/19, ordered , you plan to make an appointment Recommended yearly ophthalmology/optometry visit for glaucoma screening and checkup Recommended yearly dental visit for hygiene and checkup  Vaccinations: Influenza vaccine: Due-May obtain vaccine at our office or your local pharmacy. Pneumococcal vaccine: up to date Tdap vaccine: Due-last completed 10/12/10,May obtain vaccine at your local pharmacy. Shingles vaccine: Discuss with your pharmacy   Covid-19: up to date   Advanced directives: Please bring a copy of Living Will and/or [place] for your chart.   Conditions/risks identified: see problem list   Next appointment: Follow up in one year for your annual wellness visit 11/19/22 @ 3:30pm , this will be a telephone visit   Preventive Care 65 Years and Older, Female Preventive care refers to lifestyle choices and visits with your health care provider that can promote health and wellness. What does preventive care include? A yearly physical exam. This is also called an annual well check. Dental exams once or twice a year. Routine eye exams. Ask your health care provider how often you should have your eyes checked. Personal lifestyle choices, including: Daily care of your teeth and gums. Regular physical activity. Eating a healthy diet. Avoiding tobacco and drug use. Limiting alcohol use. Practicing safe sex. Taking low-dose aspirin every day. Taking vitamin and mineral supplements as recommended by your health care provider. What happens during an annual well check? The  services and screenings done by your health care provider during your annual well check will depend on your age, overall health, lifestyle risk factors, and family history of disease. Counseling  Your health care provider may ask you questions about your: Alcohol use. Tobacco use. Drug use. Emotional well-being. Home and relationship well-being. Sexual activity. Eating habits. History of falls. Memory and ability to understand (cognition). Work and work Statistician. Reproductive health. Screening  You may have the following tests or measurements: Height, weight, and BMI. Blood pressure. Lipid and cholesterol levels. These may be checked every 5 years, or more frequently if you are over 48 years old. Skin check. Lung cancer screening. You may have this screening every year starting at age 21 if you have a 30-pack-year history of smoking and currently smoke or have quit within the past 15 years. Fecal occult blood test (FOBT) of the stool. You may have this test every year starting at age 55. Flexible sigmoidoscopy or colonoscopy. You may have a sigmoidoscopy every 5 years or a colonoscopy every 10 years starting at age 11. Hepatitis C blood test. Hepatitis B blood test. Sexually transmitted disease (STD) testing. Diabetes screening. This is done by checking your blood sugar (glucose) after you have not eaten for a while (fasting). You may have this done every 1-3 years. Bone density scan. This is done to screen for osteoporosis. You may have this done starting at age 18. Mammogram. This may be done every 1-2 years. Talk to your health care provider about how often you should have regular mammograms. Talk with your health care provider about your test results, treatment options, and if necessary, the need for more tests. Vaccines  Your health care provider may recommend  certain vaccines, such as: Influenza vaccine. This is recommended every year. Tetanus, diphtheria, and acellular  pertussis (Tdap, Td) vaccine. You may need a Td booster every 10 years. Zoster vaccine. You may need this after age 52. Pneumococcal 13-valent conjugate (PCV13) vaccine. One dose is recommended after age 51. Pneumococcal polysaccharide (PPSV23) vaccine. One dose is recommended after age 32. Talk to your health care provider about which screenings and vaccines you need and how often you need them. This information is not intended to replace advice given to you by your health care provider. Make sure you discuss any questions you have with your health care provider. Document Released: 10/25/2015 Document Revised: 06/17/2016 Document Reviewed: 07/30/2015 Elsevier Interactive Patient Education  2017 [place] Prevention in the Home Falls can cause injuries. They can happen to people of all ages. There are many things you can do to make your home safe and to help prevent falls. What can I do on the outside of my home? Regularly fix the edges of walkways and driveways and fix any cracks. Remove anything that might make you trip as you walk through a door, such as a raised step or threshold. Trim any bushes or trees on the path to your home. Use bright outdoor lighting. Clear any walking paths of anything that might make someone trip, such as rocks or tools. Regularly check to see if handrails are loose or broken. Make sure that both sides of any steps have handrails. Any raised decks and porches should have guardrails on the edges. Have any leaves, snow, or ice cleared regularly. Use sand or salt on walking paths during winter. Clean up any spills in your garage right away. This includes oil or grease spills. What can I do in the bathroom? Use night lights. Install grab bars by the toilet and in the tub and shower. Do not use towel bars as grab bars. Use non-skid mats or decals in the tub or shower. If you need to sit down in the shower, use a plastic, non-slip stool. Keep the floor  dry. Clean up any water that spills on the floor as soon as it happens. Remove soap buildup in the tub or shower regularly. Attach bath mats securely with double-sided non-slip rug tape. Do not have throw rugs and other things on the floor that can make you trip. What can I do in the bedroom? Use night lights. Make sure that you have a light by your bed that is easy to reach. Do not use any sheets or blankets that are too big for your bed. They should not hang down onto the floor. Have a firm chair that has side arms. You can use this for support while you get dressed. Do not have throw rugs and other things on the floor that can make you trip. What can I do in the kitchen? Clean up any spills right away. Avoid walking on wet floors. Keep items that you use a lot in easy-to-reach places. If you need to reach something above you, use a strong step stool that has a grab bar. Keep electrical cords out of the way. Do not use floor polish or wax that makes floors slippery. If you must use wax, use non-skid floor wax. Do not have throw rugs and other things on the floor that can make you trip. What can I do with my stairs? Do not leave any items on the stairs. Make sure that there are handrails on both sides of the  stairs and use them. Fix handrails that are broken or loose. Make sure that handrails are as long as the stairways. Check any carpeting to make sure that it is firmly attached to the stairs. Fix any carpet that is loose or worn. Avoid having throw rugs at the top or bottom of the stairs. If you do have throw rugs, attach them to the floor with carpet tape. Make sure that you have a light switch at the top of the stairs and the bottom of the stairs. If you do not have them, ask someone to add them for you. What else can I do to help prevent falls? Wear shoes that: Do not have high heels. Have rubber bottoms. Are comfortable and fit you well. Are closed at the toe. Do not wear  sandals. If you use a stepladder: Make sure that it is fully opened. Do not climb a closed stepladder. Make sure that both sides of the stepladder are locked into place. Ask someone to hold it for you, if possible. Clearly mark and make sure that you can see: Any grab bars or handrails. First and last steps. Where the edge of each step is. Use tools that help you move around (mobility aids) if they are needed. These include: Canes. Walkers. Scooters. Crutches. Turn on the lights when you go into a dark area. Replace any light bulbs as soon as they burn out. Set up your furniture so you have a clear path. Avoid moving your furniture around. If any of your floors are uneven, fix them. If there are any pets around you, be aware of where they are. Review your medicines with your doctor. Some medicines can make you feel dizzy. This can increase your chance of falling. Ask your doctor what other things that you can do to help prevent falls. This information is not intended to replace advice given to you by your health care provider. Make sure you discuss any questions you have with your health care provider. Document Released: 07/25/2009 Document Revised: 03/05/2016 Document Reviewed: 11/02/2014 Elsevier Interactive Patient Education  2017 [employer].

## 2021-11-18 ENCOUNTER — Ambulatory Visit (INDEPENDENT_AMBULATORY_CARE_PROVIDER_SITE_OTHER): Payer: Medicare Other | Admitting: Obstetrics and Gynecology

## 2021-11-18 ENCOUNTER — Encounter: Payer: Self-pay | Admitting: Obstetrics and Gynecology

## 2021-11-18 ENCOUNTER — Other Ambulatory Visit: Payer: Self-pay

## 2021-11-18 VITALS — BP 144/72 | HR 51

## 2021-11-18 DIAGNOSIS — N8110 Cystocele, unspecified: Secondary | ICD-10-CM | POA: Diagnosis not present

## 2021-11-18 DIAGNOSIS — N816 Rectocele: Secondary | ICD-10-CM | POA: Diagnosis not present

## 2021-11-18 NOTE — Progress Notes (Signed)
[facility]   Subjective:     Chief Complaint:  Chief Complaint  Patient presents with   Pessary Check    History of Present Illness: Sherri Murray is a 81 y.o. female with stage III pelvic organ prolapse who presents for a pessary check. She is using a size #3 ring with support pessary. She has had no issues with the pessary. She is using vaginal estrogen twice a week.   She feels that she may have a urinary tract infection. Has been leaking more often recently large amounts.   Past Medical History: Patient  has a past medical history of Acute ischemic colitis (Sherri Murray) (02/26/2012), Acute posthemorrhagic anemia, Arthritis, Asthma, Chronic diarrhea, Chronic foot pain, Complication of anesthesia, Difficult intubation, Fecal incontinence, Fibromyalgia, GERD (gastroesophageal reflux disease), Headache, Heart murmur, Hyperlipidemia, Hypertriglyceridemia (02/2012), IBS (irritable bowel syndrome), Incontinence of urine, Internal hemorrhoids, Ischemic colitis (Sherri Murray) (02/2012), Sleep apnea, and Thyroid goiter.   Past Surgical History: She  has a past surgical history that includes Thyroid surgery; Tonsillectomy; Tubal ligation; Cataract extraction (Bilateral); Myomectomy; Colonoscopy (02/27/2012); liver biopsy (1980); Knee Arthroplasty (Right, 12/11/2015); Sacral nerve stimulator placement; Eye surgery; Total shoulder arthroplasty (Right, 02/05/2017); and Tooth extraction.   Medications: She has a current medication list which includes the following prescription(s): acetaminophen, alendronate, atorvastatin, biotin, calcium carbonate-vitamin d, vitamin d, dicyclomine, diphenhydramine-acetaminophen, epinephrine, estradiol, folic acid, glucosamine-chondroitin, loperamide, loratadine, losartan, melatonin, meloxicam, centrum silver, nystatin cream, olopatadine hcl, oxybutynin, and pantoprazole.   Allergies: Patient is allergic to apple, banana, barium-containing compounds, bee  venom, celery oil, iodinated contrast media, ioxaglate, metrizamide, other, penicillins, strawberry extract, aspirin, barium sulfate, latex, licorice [glycyrrhiza], sulfa antibiotics, and etodolac.   Social History: Patient  reports that she has never smoked. She has never used smokeless tobacco. She reports current alcohol use of about 1.0 standard drink per week. She reports that she does not use drugs.      Objective:    Physical Exam: BP (!) 144/72    Pulse (!) 51  Gen: No apparent distress, A&O x 3. Detailed Urogynecologic Evaluation:  Pelvic Exam: Normal external female genitalia, erythema noted on external vulva; Bartholin's and Skene's glands normal in appearance; urethral meatus normal in appearance, no urethral masses or discharge. The pessary was noted to be in place. It was removed and cleaned. Speculum exam revealed no lesions in the vagina. The pessary was replaced. It was comfortable to the patient and fit well.   POP-Q:    POP-Q   2                                            Aa   2                                           Ba   -6.5                                              C    4  Gh   3                                            Pb   8                                            tvl    -1                                            Ap   -1                                            Bp   -6                                              D    POC urine: negative  Assessment/Plan:    Assessment: [name] is a 81 y.o. with stage III pelvic organ prolapse and OAB here for a pessary check. She is doing well.  Plan: POP - #3 ring with support pessary - clean once every 3 months - continue vaginal estrogen twice a week  2. OAB - Has interstim and taking myrbetriq and oxybutynin. Managed by [facility].  - No sign of infection in urine today. Recommended follow up with [facility] to discuss increased symptom  management.   [name], MD   Time Spent: Time spent: I spent 20 minutes dedicated to the care of this patient on the date of this encounter to include pre-visit review of records, face-to-face time with the patient  and post visit documentation.

## 2021-11-19 ENCOUNTER — Ambulatory Visit (INDEPENDENT_AMBULATORY_CARE_PROVIDER_SITE_OTHER): Payer: Medicare Other | Admitting: Dermatology

## 2021-11-19 DIAGNOSIS — L820 Inflamed seborrheic keratosis: Secondary | ICD-10-CM | POA: Diagnosis not present

## 2021-11-19 DIAGNOSIS — L821 Other seborrheic keratosis: Secondary | ICD-10-CM

## 2021-11-19 DIAGNOSIS — B351 Tinea unguium: Secondary | ICD-10-CM | POA: Diagnosis not present

## 2021-11-19 DIAGNOSIS — Z1283 Encounter for screening for malignant neoplasm of skin: Secondary | ICD-10-CM | POA: Diagnosis not present

## 2021-11-19 DIAGNOSIS — L239 Allergic contact dermatitis, unspecified cause: Secondary | ICD-10-CM

## 2021-11-19 DIAGNOSIS — D1800 Hemangioma unspecified site: Secondary | ICD-10-CM

## 2021-11-19 DIAGNOSIS — L814 Other melanin hyperpigmentation: Secondary | ICD-10-CM | POA: Diagnosis not present

## 2021-11-19 DIAGNOSIS — D229 Melanocytic nevi, unspecified: Secondary | ICD-10-CM

## 2021-11-19 DIAGNOSIS — L603 Nail dystrophy: Secondary | ICD-10-CM | POA: Diagnosis not present

## 2021-11-19 DIAGNOSIS — L259 Unspecified contact dermatitis, unspecified cause: Secondary | ICD-10-CM

## 2021-11-19 DIAGNOSIS — L578 Other skin changes due to chronic exposure to nonionizing radiation: Secondary | ICD-10-CM | POA: Diagnosis not present

## 2021-11-19 NOTE — Patient Instructions (Addendum)
If You Need Anything After Your Visit ° °If you have any questions or concerns for your doctor, please call our main line at [phone] and press option 4 to reach your doctor's medical assistant. If no one answers, please leave a voicemail as directed and we will return your call as soon as possible. Messages left after 4 pm will be answered the following business day.  ° °You may also send us a message via MyChart. We typically respond to MyChart messages within 1-2 business days. ° °For prescription refills, please ask your pharmacy to contact our office. Our fax number is [phone]. ° °If you have an urgent issue when the clinic is closed that cannot wait until the next business day, you can page your doctor at the number below.   ° °Please note that while we do our best to be available for urgent issues outside of office hours, we are not available 24/7.  ° °If you have an urgent issue and are unable to reach us, you may choose to seek medical care at your doctor's office, retail clinic, urgent care center, or emergency room. ° °If you have a medical emergency, please immediately call 911 or go to the emergency department. ° °Pager Numbers ° °- Dr. Kowalski: [phone] ° °- Dr. Moye: [phone] ° °- Dr. Stewart: [phone] ° °In the event of inclement weather, please call our main line at [phone] for an update on the status of any delays or closures. ° °Dermatology Medication Tips: °Please keep the boxes that topical medications come in in order to help keep track of the instructions about where and how to use these. Pharmacies typically print the medication instructions only on the boxes and not directly on the medication tubes.  ° °If your medication is too expensive, please contact our office at [phone] option 4 or send us a message through MyChart.  ° °We are unable to tell what your co-pay for medications will be in advance as this is different depending on your insurance coverage.  However, we may be able to find a substitute medication at lower cost or fill out paperwork to get insurance to cover a needed medication.  ° °If a prior authorization is required to get your medication covered by your insurance company, please allow us 1-2 business days to complete this process. ° °Drug prices often vary depending on where the prescription is filled and some pharmacies may offer cheaper prices. ° °The website www.goodrx.com contains coupons for medications through different pharmacies. The prices here do not account for what the cost may be with help from insurance (it may be cheaper with your insurance), but the website can give you the price if you did not use any insurance.  °- You can print the associated coupon and take it with your prescription to the pharmacy.  °- You may also stop by our office during regular business hours and pick up a GoodRx coupon card.  °- If you need your prescription sent electronically to a different pharmacy, notify our office through [facility] MyChart or by phone at [phone] option 4. ° ° ° ° °Si Usted Necesita Algo Después de Su Visita ° °También puede enviarnos un mensaje a través de MyChart. Por lo general respondemos a los mensajes de MyChart en el transcurso de 1 a 2 días hábiles. ° °Para renovar recetas, por favor pida a su farmacia que se ponga en contacto con nuestra oficina. Nuestro número de fax es el [phone]. ° °Si tiene   un asunto urgente cuando la clnica est cerrada y que no puede esperar hasta el siguiente da hbil, puede llamar/localizar a su doctor(a) al nmero que aparece a continuacin.   Por favor, tenga en cuenta que aunque hacemos todo lo posible para estar disponibles para asuntos urgentes fuera del horario de [place], no estamos disponibles las 24 horas del da, los 7 das de la [place].   Si tiene un problema urgente y no puede comunicarse con nosotros, puede optar por buscar atencin mdica  en el consultorio de su  doctor(a), en una clnica privada, en un centro de atencin urgente o en una sala de emergencias.  Si tiene Engineering geologist, por favor llame inmediatamente al 911 o vaya a la sala de emergencias.  Nmeros de bper  - Dr. Nehemiah Massed: [phone]  - Dra. Moye: [phone]  - Dra. Nicole Kindred: [phone]  En caso de inclemencias del [place], por favor llame a Johnsie Kindred principal al [phone] para una actualizacin sobre el [place] de cualquier retraso o cierre.  Consejos para la medicacin en dermatologa: Por favor, guarde las cajas en las que vienen los medicamentos de uso tpico para ayudarle a seguir las instrucciones sobre dnde y cmo usarlos. Las farmacias generalmente imprimen las instrucciones del medicamento slo en las cajas y no directamente en los tubos del [place].   Si su medicamento es muy caro, por favor, pngase en contacto con Zigmund Daniel llamando al [phone] y presione la opcin 4 o envenos un mensaje a travs de Pharmacist, community.   No podemos decirle cul ser su copago por los medicamentos por adelantado ya que esto es diferente dependiendo de la cobertura de su seguro. Sin embargo, es posible que podamos encontrar un medicamento sustituto a Electrical engineer un formulario para que el seguro cubra el medicamento que se considera necesario.   Si se requiere una autorizacin previa para que su compaa de seguros [place] su medicamento, por favor permtanos de 1 a 2 das hbiles para completar este proceso.  Los precios de los medicamentos varan con frecuencia dependiendo del Environmental consultant de dnde se surte la receta y alguna farmacias pueden ofrecer precios ms baratos.  El sitio web www.goodrx.com tiene cupones para medicamentos de Airline pilot. Los precios aqu no tienen en cuenta lo que podra costar con la ayuda del seguro (puede ser ms barato con su seguro), pero el sitio web puede darle el precio si no utiliz Research scientist (physical sciences).  - Puede imprimir el cupn  correspondiente y llevarlo con su receta a la farmacia.  - Tambin puede pasar por nuestra oficina durante el horario de atencin regular y Charity fundraiser una tarjeta de cupones de GoodRx.  - Si necesita que su receta se enve electrnicamente a una farmacia diferente, informe a nuestra oficina a travs de MyChart de [place] o por telfono llamando al [phone] y presione la opcin 4.   Cryotherapy Aftercare  Wash gently with soap and water everyday.   Apply Vaseline and Band-Aid daily until healed.     Seborrheic Keratosis  What causes seborrheic keratoses? Seborrheic keratoses are harmless, common skin growths that first appear during adult life.  As time goes by, more growths appear.  Some people may develop a large number of them.  Seborrheic keratoses appear on both covered and uncovered body parts.  They are not caused by sunlight.  The tendency to develop seborrheic keratoses can be inherited.  They vary in color from skin-colored to gray, brown, or even black.  They can be either  smooth or have a rough, warty surface.   Seborrheic keratoses are superficial and look as if they were stuck on the skin.  Under the microscope this type of keratosis looks like layers upon layers of skin.  That is why at times the top layer may seem to fall off, but the rest of the growth remains and re-grows.    Treatment Seborrheic keratoses do not need to be treated, but can easily be removed in the office.  Seborrheic keratoses often cause symptoms when they rub on clothing or jewelry.  Lesions can be in the way of shaving.  If they become inflamed, they can cause itching, soreness, or burning.  Removal of a seborrheic keratosis can be accomplished by freezing, burning, or surgery. If any spot bleeds, scabs, or grows rapidly, please return to have it checked, as these can be an indication of a skin cancer.  For Rash from pull up Recommend Desitin for barrier between skin and pull up  When you get the  rash can use over the counter Hydrocortisone cream once daily until clear, then as needed for flares

## 2021-11-19 NOTE — Progress Notes (Signed)
New Patient Visit  Subjective  Sherri Murray is a 81 y.o. female who presents for the following: Total body skin exam (Worried about some moles). The patient presents for Total-Body Skin Exam (TBSE) for skin cancer screening and mole check.  The patient has spots, moles and lesions to be evaluated, some may be new or changing and the patient has concerns that these could be cancer.   The following portions of the chart were reviewed this encounter and updated as appropriate:   Tobacco   Allergies   Meds   Problems   Med Hx   Surg Hx   Fam Hx      Review of Systems:  No other skin or systemic complaints except as noted in HPI or Assessment and Plan.  Objective  Well appearing patient in no apparent distress; mood and affect are within normal limits.  A full examination was performed including scalp, head, eyes, ears, nose, lips, neck, chest, axillae, abdomen, back, buttocks, bilateral upper extremities, bilateral lower extremities, hands, feet, fingers, toes, fingernails, and toenails. All findings within normal limits unless otherwise noted below.  Mid back x 1, L ant neck x 1, L inf breast x 1, L lat brest x 1 (4) Stuck on waxy paps with erythema   bil feet toenails Toenail dystophy  buttocks No active rash   Assessment & Plan   Lentigines - Scattered tan macules - Due to sun exposure - Benign-appearing, observe - Recommend daily broad spectrum sunscreen SPF 30+ to sun-exposed areas, reapply every 2 hours as needed. - Call for any changes  Seborrheic Keratoses - Stuck-on, waxy, tan-brown papules and/or plaques  - Benign-appearing - Discussed benign etiology and prognosis. - Observe - Call for any changes  Melanocytic Nevi - Tan-brown and/or pink-flesh-colored symmetric macules and papules - Benign appearing on exam today - Observation - Call clinic for new or changing moles - Recommend daily use of broad spectrum spf 30+ sunscreen to sun-exposed areas.    Hemangiomas - Red papules - Discussed benign nature - Observe - Call for any changes  Actinic Damage - Chronic condition, secondary to cumulative UV/sun exposure - diffuse scaly erythematous macules with underlying dyspigmentation - Recommend daily broad spectrum sunscreen SPF 30+ to sun-exposed areas, reapply every 2 hours as needed.  - Staying in the shade or wearing long sleeves, sun glasses (UVA+UVB protection) and wide brim hats (4-inch brim around the entire circumference of the hat) are also recommended for sun protection.  - Call for new or changing lesions.  Skin cancer screening performed today.  Inflamed seborrheic keratosis (4) Mid back x 1, L ant neck x 1, L inf breast x 1, L lat brest x 1 Destruction of lesion - Mid back x 1, L ant neck x 1, L inf breast x 1, L lat brest x 1 Complexity: simple   Destruction method: cryotherapy   Informed consent: discussed and consent obtained   Timeout:  patient name, date of birth, surgical site, and procedure verified Lesion destroyed using liquid nitrogen: Yes   Region frozen until ice ball extended beyond lesion: Yes   Outcome: patient tolerated procedure well with no complications   Post-procedure details: wound care instructions given    Toenail dystrophy bil feet toenails Patient declines treatment.  Contact dermatitis buttocks Possible reaction to adult pull up / diaper Recommend Desitin for barrier between skin and pull up  When pt gets the rash can use otc Hydrocortisone cream qd to bid until clear, then  prn flares  Skin cancer screening  Return in about 1 year (around 11/19/2022) for TBSE.  I, Othelia Pulling, RMA, am acting as scribe for Sarina Ser, MD . Documentation: I have reviewed the above documentation for accuracy and completeness, and I agree with the above.  Sarina Ser, MD

## 2021-11-21 ENCOUNTER — Encounter: Payer: Self-pay | Admitting: Dermatology

## 2021-11-21 ENCOUNTER — Ambulatory Visit (INDEPENDENT_AMBULATORY_CARE_PROVIDER_SITE_OTHER): Payer: Medicare Other | Admitting: Family Medicine

## 2021-11-21 ENCOUNTER — Other Ambulatory Visit: Payer: Self-pay

## 2021-11-21 ENCOUNTER — Encounter: Payer: Self-pay | Admitting: Family Medicine

## 2021-11-21 VITALS — BP 110/70 | HR 51 | Temp 98.7°F | Ht 62.5 in | Wt 147.2 lb

## 2021-11-21 DIAGNOSIS — R0789 Other chest pain: Secondary | ICD-10-CM | POA: Diagnosis not present

## 2021-11-21 DIAGNOSIS — E7800 Pure hypercholesterolemia, unspecified: Secondary | ICD-10-CM | POA: Diagnosis not present

## 2021-11-21 DIAGNOSIS — I10 Essential (primary) hypertension: Secondary | ICD-10-CM | POA: Diagnosis not present

## 2021-11-21 DIAGNOSIS — B379 Candidiasis, unspecified: Secondary | ICD-10-CM | POA: Diagnosis not present

## 2021-11-21 DIAGNOSIS — R7303 Prediabetes: Secondary | ICD-10-CM | POA: Diagnosis not present

## 2021-11-21 DIAGNOSIS — Z0000 Encounter for general adult medical examination without abnormal findings: Secondary | ICD-10-CM | POA: Diagnosis not present

## 2021-11-21 DIAGNOSIS — R413 Other amnesia: Secondary | ICD-10-CM

## 2021-11-21 DIAGNOSIS — Z23 Encounter for immunization: Secondary | ICD-10-CM | POA: Diagnosis not present

## 2021-11-21 DIAGNOSIS — K580 Irritable bowel syndrome with diarrhea: Secondary | ICD-10-CM | POA: Diagnosis not present

## 2021-11-21 DIAGNOSIS — E890 Postprocedural hypothyroidism: Secondary | ICD-10-CM | POA: Diagnosis not present

## 2021-11-21 DIAGNOSIS — E559 Vitamin D deficiency, unspecified: Secondary | ICD-10-CM | POA: Diagnosis not present

## 2021-11-21 MED ORDER — NYSTATIN 100000 UNIT/GM EX CREA: TOPICAL_CREAM | CUTANEOUS | 3 refills | Status: DC

## 2021-11-21 MED ORDER — ATORVASTATIN CALCIUM 40 MG PO TABS: ORAL_TABLET | ORAL | 3 refills | Status: DC

## 2021-11-21 MED ORDER — OLOPATADINE HCL 0.2 % OP SOLN: 1.0000 [drp] | Freq: Two times a day (BID) | OPHTHALMIC | 1 refills | Status: DC | PRN

## 2021-11-21 MED ORDER — DICYCLOMINE HCL 20 MG PO TABS: 20.0000 mg | ORAL_TABLET | Freq: Three times a day (TID) | ORAL | 3 refills | Status: DC

## 2021-11-21 NOTE — Assessment & Plan Note (Signed)
Normal neuro exam.  Will eval with labs for reversible secondary cause.  Will speak with family about their concerns. Consider stopping oxybutynin as it is a high risk med after age 81... could contribute to confusion of memory loss.
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
## 2021-11-21 NOTE — Assessment & Plan Note (Signed)
Stable, chronic.  Continue current medication.   on losartan  25 mg daily 

## 2021-11-21 NOTE — Assessment & Plan Note (Signed)
Flared up off bentyl.. refilled.

## 2021-11-21 NOTE — Progress Notes (Signed)
Patient ID: Sherri Murray, female    DOB: 12-Aug-1941, 81 y.o.   MRN: [id number]  This visit was conducted in person.  BP 110/70    Pulse (!) 51    Temp 98.7 F (37.1 C) (Temporal)    Ht 5' 2.5" (1.588 m)    Wt 147 lb 3 oz (66.8 kg)    SpO2 97%    BMI 26.49 kg/m    CC:  Chief Complaint  Patient presents with   Annual Exam    Part 2    Subjective:   HPI: Sherri Murray is a 81 y.o. female presenting on 11/21/2021 for Annual Exam (Part 2) The patient presents for  complete physical and review of chronic health problems. He/She also has the following acute concerns today:  Family has noted memory loss over the  few months. She states no real issue just missing several office visit.. family not present.  She has run out of Bentyl.. but IBS has flared up.. frequent BMs and abd pain   On oxybutynin  Hypertension:   Great control on losartan  25 mg daily BP Readings from Last 3 Encounters:  11/21/21 110/70  11/18/21 (!) 144/72  08/27/21 (!) 154/84   Using medication without problems or lightheadedness: none Chest pain with exertion: none, occ sharp pain in back, chest and in jaws.. wakes her up some when sleeping. Occurring intermittently in last She does have pantoprazole 40 mg daily Edema:none Short of breath:none Average home BPs: Other issues:  Prediabetes  High cholesterol  Due for re-eval.  Fibromyalgia followed by Dr. Estanislado Pandy.   Hypothyroid.Marland Kitchen due for re-eval.        Relevant past medical, surgical, family and social history reviewed and updated as indicated. Interim medical history since our last visit reviewed. Allergies and medications reviewed and updated. Outpatient Medications Prior to Visit  Medication Sig Dispense Refill   acetaminophen (TYLENOL) 325 MG tablet Take 650 mg by mouth every 6 (six) hours as needed.     alendronate (FOSAMAX) 70 MG tablet TAKE 1 TABLET (70 MG TOTAL) BY MOUTH ONCE A WEEK. TAKE WITH A FULL GLASS OF WATER  ON AN EMPTY STOMACH. 4 tablet 0   atorvastatin (LIPITOR) 40 MG tablet TAKE 1 TABLET BY MOUTH EVERYDAY AT BEDTIME 90 tablet 0   Biotin 5000 MCG TABS Take 5,000 mcg by mouth daily.     Biotin w/ Vitamins C & E (HAIR/SKIN/NAILS PO) Take 1 tablet by mouth in the morning, at noon, and at bedtime.     Calcium Carbonate-Vitamin D 600-400 MG-UNIT chew tablet Chew 1 tablet by mouth daily.      Cholecalciferol (VITAMIN D PO) Take by mouth daily.     dicyclomine (BENTYL) 20 MG tablet TAKE 1 TABLET (20 MG TOTAL) BY MOUTH 4 (FOUR) TIMES DAILY - BEFORE MEALS AND AT BEDTIME. 360 tablet 0   diphenhydramine-acetaminophen (TYLENOL PM) 25-500 MG TABS tablet Take 1 tablet by mouth at bedtime as needed.     EPINEPHRINE 0.3 mg/0.3 mL IJ SOAJ injection INJECT 0.3 MLS (0.3 MG TOTAL) INTO THE MUSCLE AS NEEDED. 2 each 2   estradiol (ESTRACE) 0.1 MG/GM vaginal cream Place 0.5 g vaginally 2 (two) times a week. Place 0.5g nightly for two weeks then twice a week after 30 g 11   Flaxseed, Linseed, (FLAXSEED OIL PO) Take 1 capsule by mouth in the morning and at bedtime.     FOLIC ACID PO Take 1 tablet by mouth daily.  GLUCOSAMINE-CHONDROITIN PO Take 1 tablet by mouth 2 (two) times daily.     loperamide (IMODIUM) 2 MG capsule Take 2 mg by mouth as needed for diarrhea or loose stools.     loratadine (CLARITIN) 10 MG tablet Take 10 mg by mouth daily as needed for allergies.     losartan (COZAAR) 25 MG tablet TAKE 2 TABLETS BY MOUTH EVERY DAY 180 tablet 3   MELATONIN PO Take by mouth at bedtime.     meloxicam (MOBIC) 15 MG tablet TAKE 1 TABLET BY MOUTH EVERY DAY 90 tablet 0   Multiple Vitamins-Minerals (CENTRUM SILVER) tablet Take 1 tablet by mouth daily.     Multiple Vitamins-Minerals (ZINC PO) Take 1 tablet by mouth daily.     nystatin cream (MYCOSTATIN) APPLY TO AFFECTED AREA TWICE A DAY 60 g 3   Olopatadine HCl 0.2 % SOLN PLACE 1 DROP INTO BOTH EYES 2 (TWO) TIMES DAILY AS NEEDED. 7.5 mL 1   oxybutynin (DITROPAN XL) 15 MG  24 hr tablet TAKE 1 TABLET BY MOUTH EVERYDAY AT BEDTIME 90 tablet 3   pantoprazole (PROTONIX) 40 MG tablet TAKE 1 TABLET BY MOUTH TWICE A DAY 180 tablet 3   No facility-administered medications prior to visit.     Per HPI unless specifically indicated in ROS section below Review of Systems  Constitutional:  Negative for fatigue and fever.  HENT:  Negative for congestion.   Eyes:  Negative for pain.  Respiratory:  Negative for cough and shortness of breath.   Cardiovascular:  Negative for chest pain, palpitations and leg swelling.  Gastrointestinal:  Negative for abdominal pain.  Genitourinary:  Negative for dysuria and vaginal bleeding.  Musculoskeletal:  Negative for back pain.  Neurological:  Negative for syncope, light-headedness and headaches.  Psychiatric/Behavioral:  Negative for dysphoric mood.   Objective:  BP 110/70    Pulse (!) 51    Temp 98.7 F (37.1 C) (Temporal)    Ht 5' 2.5" (1.588 m)    Wt 147 lb 3 oz (66.8 kg)    SpO2 97%    BMI 26.49 kg/m   Wt Readings from Last 3 Encounters:  11/21/21 147 lb 3 oz (66.8 kg)  11/17/21 152 lb (68.9 kg)  08/27/21 152 lb (68.9 kg)      Physical Exam Constitutional:      General: She is not in acute distress.    Appearance: Normal appearance. She is well-developed. She is not ill-appearing or toxic-appearing.  HENT:     Head: Normocephalic.     Right Ear: Hearing, tympanic membrane, ear canal and external ear normal. Tympanic membrane is not erythematous, retracted or bulging.     Left Ear: Hearing, tympanic membrane, ear canal and external ear normal. Tympanic membrane is not erythematous, retracted or bulging.     Nose: No mucosal edema or rhinorrhea.     Right Sinus: No maxillary sinus tenderness or frontal sinus tenderness.     Left Sinus: No maxillary sinus tenderness or frontal sinus tenderness.     Mouth/Throat:     Pharynx: Uvula midline.  Eyes:     General: Lids are normal. Lids are everted, no foreign bodies  appreciated.     Conjunctiva/sclera: Conjunctivae normal.     Pupils: Pupils are equal, round, and reactive to light.  Neck:     Thyroid: No thyroid mass or thyromegaly.     Vascular: No carotid bruit.     Trachea: Trachea normal.  Cardiovascular:     Rate and  Rhythm: Normal rate and regular rhythm.     Pulses: Normal pulses.     Heart sounds: Normal heart sounds, S1 normal and S2 normal. No murmur heard.   No friction rub. No gallop.  Pulmonary:     Effort: Pulmonary effort is normal. No tachypnea or respiratory distress.     Breath sounds: Normal breath sounds. No decreased breath sounds, wheezing, rhonchi or rales.  Abdominal:     General: Bowel sounds are normal.     Palpations: Abdomen is soft.     Tenderness: There is no abdominal tenderness.  Musculoskeletal:     Cervical back: Normal range of motion and neck supple.  Skin:    General: Skin is warm and dry.     Findings: No rash.  Neurological:     Mental Status: She is alert and oriented to person, place, and time.     GCS: GCS eye subscore is 4. GCS verbal subscore is 5. GCS motor subscore is 6.     Cranial Nerves: No cranial nerve deficit.     Sensory: No sensory deficit.     Motor: No abnormal muscle tone.     Coordination: Coordination normal.     Gait: Gait normal.     Deep Tendon Reflexes: Reflexes are normal and symmetric.     Comments: Nml cerebellar exam   No papilledema  Psychiatric:        Mood and Affect: Mood is not anxious or depressed.        Speech: Speech normal.        Behavior: Behavior normal. Behavior is cooperative.        Thought Content: Thought content normal.        Cognition and Memory: Memory is not impaired. She does not exhibit impaired recent memory or impaired remote memory.        Judgment: Judgment normal.      Results for orders placed or performed in visit on 08/20/21  COMPLETE METABOLIC PANEL WITH GFR  Result Value Ref Range   Glucose, Bld 90 65 - 99 mg/dL   BUN 10 7 - 25  mg/dL   Creat 0.65 0.60 - 0.95 mg/dL   eGFR 88 > OR = 60 mL/min/1.61m   BUN/Creatinine Ratio NOT APPLICABLE 6 - 22 (calc)   Sodium 133 (L) 135 - 146 mmol/L   Potassium 4.5 3.5 - 5.3 mmol/L   Chloride 95 (L) 98 - 110 mmol/L   CO2 25 20 - 32 mmol/L   Calcium 9.5 8.6 - 10.4 mg/dL   Total Protein 6.8 6.1 - 8.1 g/dL   Albumin 4.1 3.6 - 5.1 g/dL   Globulin 2.7 1.9 - 3.7 g/dL (calc)   AG Ratio 1.5 1.0 - 2.5 (calc)   Total Bilirubin 0.4 0.2 - 1.2 mg/dL   Alkaline phosphatase (APISO) 50 37 - 153 U/L   AST 22 10 - 35 U/L   ALT 14 6 - 29 U/L  VITAMIN D 25 Hydroxy (Vit-D Deficiency, Fractures)  Result Value Ref Range   Vit D, 25-Hydroxy 50 30 - 100 ng/mL    This visit occurred during the SARS-CoV-2 public health emergency.  Safety protocols were in place, including screening questions prior to the visit, additional usage of staff PPE, and extensive cleaning of exam room while observing appropriate contact time as indicated for disinfecting solutions.   COVID 19 screen:  No recent travel or known exposure to COVID19 The patient denies respiratory symptoms of COVID 19 at this time.  The importance of social distancing was discussed today.   Assessment and Plan The patient's preventative maintenance and recommended screening tests for an annual wellness exam were reviewed in full today. Brought up to date unless services declined.  Counselled on the importance of diet, exercise, and its role in overall health and mortality. The patient's FH and SH was reviewed, including their home life, tobacco status, and drug and alcohol status.    Vaccine: COVID x 3, S/P PNA, She reports she shingrix x 1, but never got the second.  Plans yearly mammogram... repeat due in 03/2022  Colon: not indicated at age 30.  DEXA 2020 osteoporosis on fosamax.. plan repeat.. ordered.  Problem List Items Addressed This Visit     Benign essential HTN    Stable, chronic.  Continue current medication.   on losartan   25 mg daily      Relevant Medications   atorvastatin (LIPITOR) 40 MG tablet   High cholesterol   Relevant Medications   atorvastatin (LIPITOR) 40 MG tablet   Other Relevant Orders   Lipid panel   Comprehensive metabolic panel   IBS (irritable bowel syndrome)    Flared up off bentyl.. refilled.      Relevant Medications   dicyclomine (BENTYL) 20 MG tablet   Memory loss    Normal neuro exam.  Will eval with labs for reversible secondary cause.  Will speak with family about their concerns. Consider stopping oxybutynin as it is a high risk med after age 7... could contribute to confusion of memory loss.      Relevant Orders   CBC with Differential/Platelet   Vitamin B12   VITAMIN D 25 Hydroxy (Vit-D Deficiency, Fractures)   Post-surgical hypothyroidism   Relevant Orders   T4, free   T3, free   TSH   Prediabetes   Relevant Orders   Hemoglobin A1c   Other Visit Diagnoses     Routine general medical examination at a health care facility    -  Primary   Yeast infection       Relevant Medications   nystatin cream (MYCOSTATIN)   Need for influenza vaccination       Relevant Orders   Flu Vaccine QUAD High Dose(Fluad) (Completed)   Atypical chest pain       Relevant Orders   Ambulatory referral to Cardiology       Eliezer Lofts, MD

## 2021-11-21 NOTE — Patient Instructions (Addendum)
Can increase pantoprazole  to twice daily for possible acid reflux. Avoid acidic foods and acid triggers. Decreased citrus and peppermint.  We will refer to cardiology for atypical chest. Consider stopping oxybutynin if memory issues continue.  Get second Shingrix vaccine.   Please call the location of your choice from the menu below to schedule your Mammogram and/or Bone Density appointment.    [facility]                      Phone:  [phone], [place] [address]                                                             Services: Traditional and 3D Mammogram, [place] [facility]                 Phone: [phone] [address], [place] [address]    Service: Bone Density ONLY   *this site does NOT perform mammograms  [place]                        Phone:  [phone] [address], [place] [address]                                            Services:  3D Mammogram and [place] at [facility]   Phone:  [phone]   [place], [place] [address]                                            Services: 3D Mammogram and [place]  [place] at [facility])  Phone:  [phone]   [address]  [place], [place] [address]                                              Services:  3D Mammogram and Bone Density

## 2021-11-22 LAB — COMPREHENSIVE METABOLIC PANEL
AG Ratio: 1.7 (calc) (ref 1.0–2.5)
ALT: 16 U/L (ref 6–29)
AST: 26 U/L (ref 10–35)
Albumin: 4.3 g/dL (ref 3.6–5.1)
Alkaline phosphatase (APISO): 47 U/L (ref 37–153)
BUN: 13 mg/dL (ref 7–25)
CO2: 24 mmol/L (ref 20–32)
Calcium: 9.4 mg/dL (ref 8.6–10.4)
Chloride: 102 mmol/L (ref 98–110)
Creat: 0.92 mg/dL (ref 0.60–0.95)
Globulin: 2.6 g/dL (calc) (ref 1.9–3.7)
Glucose, Bld: 97 mg/dL (ref 65–99)
Potassium: 4.5 mmol/L (ref 3.5–5.3)
Sodium: 136 mmol/L (ref 135–146)
Total Bilirubin: 0.3 mg/dL (ref 0.2–1.2)
Total Protein: 6.9 g/dL (ref 6.1–8.1)

## 2021-11-22 LAB — CBC WITH DIFFERENTIAL/PLATELET
Absolute Monocytes: 561 cells/uL (ref 200–950)
Basophils Absolute: 33 cells/uL (ref 0–200)
Basophils Relative: 0.5 %
Eosinophils Absolute: 92 cells/uL (ref 15–500)
Eosinophils Relative: 1.4 %
HCT: 35.1 % (ref 35.0–45.0)
Hemoglobin: 11.9 g/dL (ref 11.7–15.5)
Lymphs Abs: 2561 cells/uL (ref 850–3900)
MCH: 31.9 pg (ref 27.0–33.0)
MCHC: 33.9 g/dL (ref 32.0–36.0)
MCV: 94.1 fL (ref 80.0–100.0)
MPV: 10.5 fL (ref 7.5–12.5)
Monocytes Relative: 8.5 %
Neutro Abs: 3353 cells/uL (ref 1500–7800)
Neutrophils Relative %: 50.8 %
Platelets: 239 10*3/uL (ref 140–400)
RBC: 3.73 10*6/uL — ABNORMAL LOW (ref 3.80–5.10)
RDW: 12 % (ref 11.0–15.0)
Total Lymphocyte: 38.8 %
WBC: 6.6 10*3/uL (ref 3.8–10.8)

## 2021-11-22 LAB — LIPID PANEL
Cholesterol: 181 mg/dL (ref <200)
HDL: 54 mg/dL (ref >=50)
LDL Cholesterol (Calc): 106 mg/dL (calc) — ABNORMAL HIGH
Non-HDL Cholesterol (Calc): 127 mg/dL (calc) (ref <130)
Total CHOL/HDL Ratio: 3.4 (calc) (ref <5.0)
Triglycerides: 117 mg/dL (ref <150)

## 2021-11-22 LAB — T4, FREE: Free T4: 0.9 ng/dL (ref 0.8–1.8)

## 2021-11-22 LAB — VITAMIN B12: Vitamin B-12: 576 pg/mL (ref 200–1100)

## 2021-11-22 LAB — VITAMIN D 25 HYDROXY (VIT D DEFICIENCY, FRACTURES): Vit D, 25-Hydroxy: 45 ng/mL (ref 30–100)

## 2021-11-22 LAB — TSH: TSH: 2.33 mIU/L (ref 0.40–4.50)

## 2021-11-22 LAB — T3, FREE: T3, Free: 2.5 pg/mL (ref 2.3–4.2)

## 2021-11-22 LAB — HEMOGLOBIN A1C
Hgb A1c MFr Bld: 5.4 % of total Hgb (ref <5.7)
Mean Plasma Glucose: 108 mg/dL
eAG (mmol/L): 6 mmol/L

## 2021-11-24 ENCOUNTER — Ambulatory Visit (INDEPENDENT_AMBULATORY_CARE_PROVIDER_SITE_OTHER): Payer: Medicare Other | Admitting: Internal Medicine

## 2021-11-24 ENCOUNTER — Other Ambulatory Visit: Payer: Self-pay

## 2021-11-24 ENCOUNTER — Encounter: Payer: Self-pay | Admitting: Internal Medicine

## 2021-11-24 DIAGNOSIS — R072 Precordial pain: Secondary | ICD-10-CM | POA: Diagnosis not present
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
## 2021-11-24 NOTE — Patient Instructions (Signed)
Medication Instructions:  Your physician recommends that you continue on your current medications as directed. Please refer to the Current Medication list given to you today.  *If you need a refill on your cardiac medications before your next appointment, please call your pharmacy*   Lab Work: none If you have labs (blood work) drawn today and your tests are completely normal, you will receive your results only by: [facility] [place] (if you have MyChart) OR A paper copy in the mail If you have any lab test that is abnormal or we need to change your treatment, we will call you to review the results.   Testing/Procedures: Your physician has requested that you have a dobutamine echocardiogram. For further information please visit [email or web address]. Please follow instruction sheet as given.    Follow-Up: At [facility], you and your health needs are our priority.  As part of our continuing mission to provide you with exceptional heart care, we have created designated Provider Care Teams.  These Care Teams include your primary Cardiologist (physician) and Advanced Practice Providers (APPs -  Physician Assistants and Nurse Practitioners) who all work together to provide you with the care you need, when you need it.  We recommend signing up for the patient portal called "MyChart".  Sign up information is provided on this After Visit Summary.  MyChart is used to connect with patients for Virtual Visits (Telemedicine).  Patients are able to view lab/test results, encounter notes, upcoming appointments, etc.  Non-urgent messages can be sent to your provider as well.   To learn more about what you can do with MyChart, go to [email or web address].    Your next appointment:   As needed  The format for your next appointment:   In Person  Provider:   Early Osmond, MD     Other Instructions

## 2021-11-28 DIAGNOSIS — H532 Diplopia: Secondary | ICD-10-CM | POA: Diagnosis not present

## 2021-12-04 ENCOUNTER — Other Ambulatory Visit: Payer: Self-pay

## 2021-12-04 ENCOUNTER — Ambulatory Visit (INDEPENDENT_AMBULATORY_CARE_PROVIDER_SITE_OTHER): Payer: Medicare Other | Admitting: *Deleted

## 2021-12-04 DIAGNOSIS — T63441D Toxic effect of venom of bees, accidental (unintentional), subsequent encounter: Secondary | ICD-10-CM

## 2021-12-09 ENCOUNTER — Telehealth (HOSPITAL_COMMUNITY): Payer: Self-pay

## 2021-12-11 ENCOUNTER — Other Ambulatory Visit (HOSPITAL_COMMUNITY): Payer: Medicare Other

## 2021-12-18 ENCOUNTER — Telehealth (HOSPITAL_COMMUNITY): Payer: Self-pay | Admitting: *Deleted

## 2021-12-18 NOTE — Telephone Encounter (Signed)
Patient given detailed instructions per Stress Test Requisition Sheet for test on 12/25/2021 at 2:00.Patient Notified to arrive 30 minutes early, and that it is imperative to arrive on time for appointment to keep from having the test rescheduled.  Patient verbalized understanding. Glade Lloyd S ? ?  ? ?

## 2021-12-25 ENCOUNTER — Ambulatory Visit (HOSPITAL_COMMUNITY): Payer: Medicare Other | Attending: Cardiology

## 2021-12-25 ENCOUNTER — Other Ambulatory Visit: Payer: Self-pay

## 2021-12-25 ENCOUNTER — Ambulatory Visit (HOSPITAL_COMMUNITY): Payer: Medicare Other

## 2021-12-25 ENCOUNTER — Other Ambulatory Visit: Payer: Self-pay | Admitting: Family Medicine

## 2021-12-25 DIAGNOSIS — R072 Precordial pain: Secondary | ICD-10-CM | POA: Insufficient documentation

## 2021-12-25 MED ORDER — PERFLUTREN LIPID MICROSPHERE
1.0000 mL | INTRAVENOUS | Status: AC | PRN
  Administered 2021-12-25: 2 mL via INTRAVENOUS

## 2021-12-25 MED ORDER — SODIUM CHLORIDE 0.9 % IV SOLN
10.0000 ug/kg/min | INTRAVENOUS | Status: AC
  Administered 2021-12-25: 20 ug/kg/min via INTRAVENOUS

## 2021-12-27 LAB — ECHOCARDIOGRAM STRESS TEST
Area-P 1/2: 3.3 cm2
MV M vel: 5.67 m/s
MV Peak grad: 128.6 mmHg
S' Lateral: 2 cm

## 2022-01-01 ENCOUNTER — Other Ambulatory Visit: Payer: Self-pay

## 2022-01-01 ENCOUNTER — Ambulatory Visit (INDEPENDENT_AMBULATORY_CARE_PROVIDER_SITE_OTHER): Payer: Medicare Other | Admitting: *Deleted

## 2022-01-01 DIAGNOSIS — T63441D Toxic effect of venom of bees, accidental (unintentional), subsequent encounter: Secondary | ICD-10-CM

## 2022-01-17 ENCOUNTER — Other Ambulatory Visit: Payer: Self-pay | Admitting: Family Medicine

## 2022-01-17 ENCOUNTER — Other Ambulatory Visit: Payer: Self-pay | Admitting: Physician Assistant

## 2022-01-29 ENCOUNTER — Other Ambulatory Visit: Payer: Self-pay

## 2022-01-29 ENCOUNTER — Encounter: Payer: Self-pay | Admitting: Allergy & Immunology

## 2022-01-29 ENCOUNTER — Ambulatory Visit: Payer: Medicare Other

## 2022-01-29 ENCOUNTER — Ambulatory Visit (INDEPENDENT_AMBULATORY_CARE_PROVIDER_SITE_OTHER): Payer: Medicare Other | Admitting: Allergy & Immunology

## 2022-01-29 VITALS — BP 112/68 | HR 74 | Temp 98.3°F | Resp 16 | Ht 62.5 in | Wt 147.0 lb

## 2022-01-29 DIAGNOSIS — J4520 Mild intermittent asthma, uncomplicated: Secondary | ICD-10-CM

## 2022-01-29 DIAGNOSIS — T63441D Toxic effect of venom of bees, accidental (unintentional), subsequent encounter: Secondary | ICD-10-CM | POA: Diagnosis not present

## 2022-01-29 DIAGNOSIS — J310 Chronic rhinitis: Secondary | ICD-10-CM

## 2022-01-29 MED ORDER — LORATADINE 10 MG PO TABS: 10.0000 mg | ORAL_TABLET | Freq: Every day | ORAL | 11 refills | Status: DC

## 2022-01-29 MED ORDER — EPINEPHRINE 0.3 MG/0.3ML IJ SOAJ: 0.3000 mg | INTRAMUSCULAR | 1 refills | Status: AC | PRN

## 2022-01-29 MED ORDER — MONTELUKAST SODIUM 10 MG PO TABS: 10.0000 mg | ORAL_TABLET | Freq: Every day | ORAL | 11 refills | Status: DC

## 2022-01-29 MED ORDER — ALBUTEROL SULFATE HFA 108 (90 BASE) MCG/ACT IN AERS: 2.0000 | INHALATION_SPRAY | Freq: Four times a day (QID) | RESPIRATORY_TRACT | 2 refills | Status: AC | PRN

## 2022-01-29 NOTE — Progress Notes (Signed)
? ?FOLLOW UP ? ?Date of Service/Encounter:  01/29/22 ? ? ?Assessment:  ? 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  ?Mild intermittent asthma - resolved (no use of albuterol in >3 years) ?  ?Chronic nonseasonal allergic rhinitis (grasses, weeds, trees, molds) ?  ?Adverse food reactions - avoids multiple triggering foods ?  ?COVID infection (July 2021) - s/p monoclonal antibody treatments ?  ?Dysphagia - with normal laryngoscopy ?  ? ?Plan/Recommendations:  ? ?1. Mild intermittent asthma ?- Lung testing not done today.  ?- We are not going to make any medication changes at this time.  ?- Continue montelukast '10mg'$  daily.   ?- Continue with ProAir 2 puffs every 4-6 hours as needed.  ? ?2. Adverse food reactions (multiple foods) ?- Continue to avoid all of your foods.  ?- EpiPen refilled today.    ?  ?3. Chronic rhinitis ?- Continue with nasal saline as needed.  ?- Continue with loratadine (Claritin) 10 mg twice daily.   ? ?4. Insect sting allergy ?- Continue with mixed vespid immunotherapy monthly.   ?- EpiPen is up to date.  ? ?5. Return in about 1 year (around 01/30/2023).  ? ? ?Subjective:  ? ?Sherri Murray is a 81 y.o. female presenting today for follow up of  ?Chief Complaint  ?Patient presents with  ? bee venom allergy  ? Allergic Rhinitis   ? ? ?Sherri Murray has a history of the following: ?Patient Active Problem List  ? Diagnosis Date Noted  ? Memory loss 11/21/2021  ? 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 Cystocele with prolapse 03/27/2021  ? Multiple food allergies 07/27/2018  ? Gastrointestinal intolerance to foods 07/27/2018  ? H/O total shoulder replacement, right 05/03/2017  ? Secondary osteoarthritis of shoulder, right 02/05/2017  ? DJD of right shoulder 02/05/2017  ? Fibromyalgia 01/27/2017  ? Primary osteoarthritis of both hands 01/27/2017  ? Post-traumatic osteoarthritis of right shoulder 12/23/2016  ? PAC (premature atrial contraction)  12/08/2016  ? Adhesive capsulitis of right shoulder 08/18/2016  ? External hemorrhoids without complication 38/18/2993  ? Osteoarthritis of right knee 12/25/2015  ? H/O total knee replacement, right 12/11/2015  ? Advanced directives, counseling/discussion 10/18/2015  ? Total body pain 08/13/2015  ? Hearing loss due to cerumen impaction, right 07/05/2015  ? Diastolic dysfunction 71/69/6789  ? Mild mitral regurgitation 12/20/2014  ? Heart disease 12/20/2014  ? Mitral valve disorder 12/20/2014  ? Benign essential HTN 10/19/2014  ? Candidiasis of skin and nails 10/19/2014  ? High cholesterol 05/01/2014  ? Prediabetes 05/01/2014  ? Osteoarthritis 03/26/2014  ? Allergic rhinitis 09/28/2013  ? Mild intermittent asthma 09/28/2013  ? Ischemic colitis, hx of 09/28/2013  ? Hx of migraines 09/28/2013  ? GERD (gastroesophageal reflux disease) 09/28/2013  ? 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 Varicose veins of lower extremities with other complications 38/07/1750  ? Osteoporosis screening 04/21/2013  ? OAB (overactive bladder)   ? Incontinence of feces 03/16/2012  ? IBS (irritable bowel syndrome) 02/27/2012  ? Generalized abdominal pain 02/26/2012  ? ? ?History obtained from: chart review and patient. ? ?Sherri is a 81 y.o. female presenting for a follow up visit. She is well known to the practice. She was last seen in December 2021 at which time we did COVID vaccine component testing that was reassuring. We ended up administering her first COVID19 vaccine in the clinic which she tolerated well without a problem. She also has postnasal drip as well as a stinging insect allergy.  ? ?  Since the last visit, she has had quite the time. Her entire family had COVID at the same time and she had to miss a wedding on October 2nd. She has been helping her grand child pay car payments and whatnot.  ? ?Asthma/Respiratory Symptom History: She denies wheezing and coughing. She has not been SOB.  She has not needed prednisone and she  has not been in the hospital for her breathing problems. She is sleeping well at night without any problems. Sherri Murray's asthma has been well controlled. She has not required rescue medication, experienced nocturnal awakenings due to lower respiratory symptoms, nor have activities of daily living been limited. She has required no Emergency Department or Urgent Care visits for her asthma. She has required zero courses of systemic steroids for asthma exacerbations since the last visit. ACT score today is 25, indicating excellent asthma symptom control.  ? ?Allergic Rhinitis Symptom History: She has bene draining like a faucet.  She wants a refill of her loratadine. She has not been using her nasal sprays. The last one that we gave her was nasal Atrovent but she did not continue using that. She has not been on antibiotics at all since the last visit.  ? ?Stinging Insect Symptom History: She remains on her venom shots. She is getting mixed vespid 300 mcg/mL every 28 days. She has not had any accidental stings at all. She does need a new EpiPen.  ? ?Otherwise, there have been no changes to her past medical history, surgical history, family history, or social history. ? ? ? ?Review of Systems  ?Constitutional: Negative.  Negative for chills, fever, malaise/fatigue and weight loss.  ?HENT:  Negative for congestion, ear discharge, ear pain and sinus pain.   ?Eyes:  Negative for pain, discharge and redness.  ?Respiratory:  Negative for cough, sputum production, shortness of breath and wheezing.   ?Cardiovascular: Negative.  Negative for chest pain and palpitations.  ?Gastrointestinal:  Negative for abdominal pain, constipation, diarrhea, heartburn, nausea and vomiting.  ?Skin: Negative.  Negative for itching and rash.  ?Neurological:  Negative for dizziness and headaches.  ?Endo/Heme/Allergies:  Negative for environmental allergies. Does not bruise/bleed easily.   ? ? ? ?Objective:  ? ?Blood pressure 112/68, pulse 74,  temperature 98.3 ?F (36.8 ?C), temperature source Temporal, resp. rate 16, height 5' 2.5" (1.588 m), weight 147 lb (66.7 kg), SpO2 97 %. ?Body mass index is 26.46 kg/m?. ? ? ? ?Physical Exam ?Vitals reviewed.  ?Constitutional:   ?   Appearance: She is well-developed.  ?   Comments: Talkative. Wearing a large hat.   ?HENT:  ?   Head: Normocephalic and atraumatic.  ?   Right Ear: Tympanic membrane, ear canal and external ear normal.  ?   Left Ear: Tympanic membrane, ear canal and external ear normal.  ?   Nose: No nasal deformity, septal deviation, mucosal edema or rhinorrhea.  ?   Right Turbinates: Not enlarged.  ?   Left Turbinates: Not enlarged.  ?   Right Sinus: No maxillary sinus tenderness or frontal sinus tenderness.  ?   Left Sinus: No maxillary sinus tenderness or frontal sinus tenderness.  ?   Mouth/Throat:  ?   Mouth: Mucous membranes are not pale and not dry.  ?   Pharynx: Uvula midline.  ?Eyes:  ?   General:     ?   Right eye: No discharge.     ?   Left eye: No discharge.  ?   Conjunctiva/sclera: Conjunctivae  normal.  ?   Right eye: Right conjunctiva is not injected. No chemosis. ?   Left eye: Left conjunctiva is not injected. No chemosis. ?   Pupils: Pupils are equal, round, and reactive to light.  ?Cardiovascular:  ?   Rate and Rhythm: Normal rate and regular rhythm.  ?   Heart sounds: Normal heart sounds.  ?Pulmonary:  ?   Effort: Pulmonary effort is normal. No tachypnea, accessory muscle usage or respiratory distress.  ?   Breath sounds: Normal breath sounds. No wheezing, rhonchi or rales.  ?Chest:  ?   Chest wall: No tenderness.  ?Lymphadenopathy:  ?   Cervical: No cervical adenopathy.  ?Skin: ?   Coloration: Skin is not pale.  ?   Findings: No abrasion, erythema, petechiae or rash. Rash is not papular, urticarial or vesicular.  ?Neurological:  ?   Mental Status: She is alert.  ?Psychiatric:     ?   Behavior: Behavior is cooperative.  ?  ? ?Diagnostic studies: none ? ? ? ? ?  ?Salvatore Marvel, MD   ?[facility] ? ? ? ? ? ? ?

## 2022-01-29 NOTE — Patient Instructions (Addendum)
1. Mild intermittent asthma ?- Lung testing not done today.  ?- We are not going to make any medication changes at this time.  ?- Continue montelukast '10mg'$  daily.   ?- Continue with ProAir 2 puffs every 4-6 hours as needed.  ? ?2. Adverse food reactions (multiple foods) ?- Continue to avoid all of your foods.  ?- EpiPen refilled today.    ?  ?3. Chronic rhinitis ?- Continue with nasal saline as needed.  ?- Continue with loratadine (Claritin) 10 mg twice daily.   ? ?4. Insect sting allergy ?- Continue with mixed vespid immunotherapy monthly.   ?- EpiPen is up to date.  ? ?5. Return in about 1 year (around 01/30/2023).  ? ? ?Please inform us of any Emergency Department visits, hospitalizations, or changes in symptoms. Call us before going to the ED for breathing or allergy symptoms since we might be able to fit you in for a sick visit. Feel free to contact us anytime with any questions, problems, or concerns. ? ?It was a pleasure to see you again today! ? ?Websites that have reliable patient information: ?1. American Academy of Asthma, Allergy, and Immunology: www.aaaai.org ?2. Food Allergy Research and Education (FARE): foodallergy.org ?3. Mothers of Asthmatics: http://www.asthmacommunitynetwork.org ?4. [employer] of Allergy, Asthma, and Immunology: MonthlyElectricBill.co.uk ? ? ?COVID-19 Vaccine Information can be found at: ShippingScam.co.uk For questions related to vaccine distribution or appointments, please email [email or web address] or call [phone].  ? ?We realize that you might be concerned about having an allergic reaction to the COVID19 vaccines. To help with that concern, WE ARE OFFERING THE COVID19 VACCINES IN OUR OFFICE! Ask the front desk for dates!  ? ? ? ??Like? [place] on Facebook and Instagram for our latest updates!  ?  ? ? ?A healthy democracy works best when [employer] participate! Make sure you are registered to vote! If you have moved or  changed any of your contact information, you will need to get this updated before voting! ? ?In some cases, you MAY be able to register to vote online: [email or web address] ? ? ? ? ? ? ? ? ? ?

## 2022-02-05 DIAGNOSIS — R350 Frequency of micturition: Secondary | ICD-10-CM | POA: Diagnosis not present
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
## 2022-02-10 ENCOUNTER — Ambulatory Visit (INDEPENDENT_AMBULATORY_CARE_PROVIDER_SITE_OTHER): Payer: Medicare Other | Admitting: Podiatry

## 2022-02-10 DIAGNOSIS — B351 Tinea unguium: Secondary | ICD-10-CM

## 2022-02-10 DIAGNOSIS — M79676 Pain in unspecified toe(s): Secondary | ICD-10-CM | POA: Diagnosis not present
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
## 2022-02-17 ENCOUNTER — Ambulatory Visit: Payer: Medicare Other | Admitting: Rheumatology

## 2022-02-19 ENCOUNTER — Encounter: Payer: Self-pay | Admitting: Physician Assistant

## 2022-02-19 ENCOUNTER — Ambulatory Visit (INDEPENDENT_AMBULATORY_CARE_PROVIDER_SITE_OTHER): Payer: Medicare Other | Admitting: Physician Assistant

## 2022-02-19 VITALS — BP 162/77 | HR 58 | Resp 16 | Ht 62.0 in | Wt 147.0 lb

## 2022-02-19 DIAGNOSIS — I10 Essential (primary) hypertension: Secondary | ICD-10-CM

## 2022-02-19 DIAGNOSIS — I5189 Other ill-defined heart diseases: Secondary | ICD-10-CM | POA: Diagnosis not present

## 2022-02-19 DIAGNOSIS — Z96611 Presence of right artificial shoulder joint: Secondary | ICD-10-CM | POA: Diagnosis not present

## 2022-02-19 DIAGNOSIS — R2681 Unsteadiness on feet: Secondary | ICD-10-CM

## 2022-02-19 DIAGNOSIS — Z96651 Presence of right artificial knee joint: Secondary | ICD-10-CM | POA: Diagnosis not present

## 2022-02-19 DIAGNOSIS — M810 Age-related osteoporosis without current pathological fracture: Secondary | ICD-10-CM | POA: Diagnosis not present

## 2022-02-19 DIAGNOSIS — R7303 Prediabetes: Secondary | ICD-10-CM | POA: Diagnosis not present

## 2022-02-19 DIAGNOSIS — M19042 Primary osteoarthritis, left hand: Secondary | ICD-10-CM | POA: Diagnosis not present

## 2022-02-19 DIAGNOSIS — M797 Fibromyalgia: Secondary | ICD-10-CM

## 2022-02-19 DIAGNOSIS — E559 Vitamin D deficiency, unspecified: Secondary | ICD-10-CM

## 2022-02-19 DIAGNOSIS — M19041 Primary osteoarthritis, right hand: Secondary | ICD-10-CM | POA: Diagnosis not present

## 2022-02-19 DIAGNOSIS — M5136 Other intervertebral disc degeneration, lumbar region: Secondary | ICD-10-CM | POA: Diagnosis not present

## 2022-02-19 NOTE — Addendum Note (Signed)
Addended by: Earnestine Mealing on: 02/19/2022 04:10 PM ? ? Modules accepted: Orders ? ?

## 2022-02-23 NOTE — Progress Notes (Signed)
[facility] ? ? ?Subjective:  ?  ? ?Chief Complaint:  ?Chief Complaint  ?Patient presents with  ? Follow-up  ? ? ?History of Present Illness: ?Sherri Murray is a 81 y.o. female with stage III pelvic organ prolapse who presents for a pessary check. She is using a size #3 ring with support pessary. The pessary has been working well. Has not been using estrogen as frequently.  ? ?Bowel leakage has improved some. Takes metamucil and flax seed to help prevent contstipation. Keeps immodium on hand as needed. Has noticed more irritation recently on the perineal skin due to leakage. ? ?OAB managed by [facility]. Started a new medication but she is unsure of the name. Also has Interstim device.  ? ?Past Medical History: ?Patient  has a past medical history of Acute ischemic colitis ([place]) (02/26/2012), Acute posthemorrhagic anemia, Arthritis, Asthma, Chronic diarrhea, Chronic foot pain, Complication of anesthesia, Difficult intubation, Fecal incontinence, Fibromyalgia, GERD (gastroesophageal reflux disease), Headache, Heart murmur, Hyperlipidemia, Hypertriglyceridemia (02/2012), IBS (irritable bowel syndrome), Incontinence of urine, Internal hemorrhoids, Ischemic colitis ([place]) (02/2012), Sleep apnea, and Thyroid goiter.  ? ?Past Surgical History: ?She  has a past surgical history that includes Thyroid surgery; Tonsillectomy; Tubal ligation; Cataract extraction (Bilateral); Myomectomy; Colonoscopy (02/27/2012); liver biopsy (1980); Knee Arthroplasty (Right, 12/11/2015); Sacral nerve stimulator placement; Eye surgery; Total shoulder arthroplasty (Right, 02/05/2017); and Tooth extraction.  ? ?Medications: ?She has a current medication list which includes the following prescription(s): acetaminophen, albuterol, alendronate, atorvastatin, biotin, biotin w/ vitamins c & e, calcium carbonate-vitamin d, vitamin d, dicyclomine, diphenhydramine-acetaminophen, epinephrine, estradiol, flaxseed (linseed),  folic acid, glucosamine-chondroitin, loperamide, loratadine, losartan, melatonin, meloxicam, montelukast, centrum silver, multiple vitamins-minerals, nystatin cream, oxybutynin, and pantoprazole.  ? ?Allergies: ?Patient is allergic to apple juice, banana, barium-containing compounds, bee venom, celery oil, iodinated contrast media, ioxaglate, metrizamide, other, penicillins, strawberry extract, aspirin, barium sulfate, latex, licorice [glycyrrhiza], sulfa antibiotics, and etodolac.  ? ?Social History: ?Patient  reports that she has never smoked. She has never used smokeless tobacco. She reports current alcohol use of about 1.0 standard drink per week. She reports that she does not use drugs.  ? ?  ? ?Objective:  ?  ?Physical Exam: ?BP 133/79   Pulse 64  ?Gen: No apparent distress, A&O x 3. ?Detailed Urogynecologic Evaluation:  ?Bowel leakage present- irritation around perineum/ anal skin ?Pelvic Exam: Normal external female genitalia, erythema noted on external vulva; Bartholin's and Skene's glands normal in appearance; urethral meatus normal in appearance, no urethral masses or discharge. The pessary was noted to be in place. It was removed and cleaned. Speculum exam revealed no lesions in the vagina. The pessary was replaced. It was comfortable to the patient and fit well.  ? ?POP-Q:  ?  ?POP-Q ?  ?2  ?                                          Aa   ?2 ?                                          Ba   ?-6.5  ?  C  ?  ?4  ?                                          Gh   ?3  ?                                          Pb   ?8  ?                                          tvl  ?  ?-1  ?                                          Ap   ?-1  ?                                          Bp   ?-6  ?                                            D  ?  ? ?Assessment/Plan:  ?  ?Assessment: ?[name] is a 81 y.o. with stage III pelvic organ prolapse and OAB and bowel leakage here for a pessary  check.  ? ?Plan: ?POP ?- #3 ring with support pessary ?- clean once every 3 months ?- restart vaginal estrogen twice a week ? ?2. Bowel leakage ?- For skin irritation, recommended use of Baza cream- information given to patient.  ? ?Return 3 months ? ?[name], MD ? ? ?Time Spent: Time spent: I spent 20 minutes dedicated to the care of this patient on the date of this encounter to include pre-visit review of records, face-to-face time with the patient  and post visit documentation. ? ?  ? ?

## 2022-02-24 ENCOUNTER — Ambulatory Visit (INDEPENDENT_AMBULATORY_CARE_PROVIDER_SITE_OTHER): Payer: Medicare Other | Admitting: Obstetrics and Gynecology

## 2022-02-24 ENCOUNTER — Encounter: Payer: Self-pay | Admitting: Obstetrics and Gynecology

## 2022-02-24 VITALS — BP 133/79 | HR 64

## 2022-02-24 DIAGNOSIS — N816 Rectocele: Secondary | ICD-10-CM | POA: Diagnosis not present

## 2022-02-24 DIAGNOSIS — N952 Postmenopausal atrophic vaginitis: Secondary | ICD-10-CM

## 2022-02-24 DIAGNOSIS — N8110 Cystocele, unspecified: Secondary | ICD-10-CM | POA: Diagnosis not present

## 2022-02-24 DIAGNOSIS — R159 Full incontinence of feces: Secondary | ICD-10-CM | POA: Diagnosis not present

## 2022-02-26 ENCOUNTER — Ambulatory Visit (INDEPENDENT_AMBULATORY_CARE_PROVIDER_SITE_OTHER): Payer: Medicare Other

## 2022-02-26 DIAGNOSIS — T63441D Toxic effect of venom of bees, accidental (unintentional), subsequent encounter: Secondary | ICD-10-CM

## 2022-03-05 DIAGNOSIS — R350 Frequency of micturition: Secondary | ICD-10-CM | POA: Diagnosis not present

## 2022-03-30 ENCOUNTER — Ambulatory Visit: Payer: Medicare Other

## 2022-04-22 ENCOUNTER — Telehealth: Payer: Self-pay | Admitting: Allergy & Immunology

## 2022-04-22 NOTE — Telephone Encounter (Signed)
I will speak with Beth regarding when we will have the vaccines in clinic then reach out to patient to schedule.

## 2022-04-22 NOTE — Telephone Encounter (Signed)
I contacted [name] or the patient's caregiver to inform them that she received an expired dose of Pfizer COVID-19 vaccine from our office, during their visit(s), on July 2022 and January 2023. The dose was expired by 59 and 64 days. This was the patient's booster dose. I shared the following information with the patient or caregiver: vaccines given after they have expired may be less effective but we are not aware of any other adverse effects. The patient can be re-vaccinated at no cost if the patient decides to do so. Answered patient questions/concerns. Encouraged patient to reach out if they have any additional questions or concerns.  The patient decided to be re-vaccinated with the current COVID19 vaccine in early September 2023. I will route to [place] [facility] to coordinate this. She needs to be boosted before she visits her friend in [place].   Salvatore Marvel, MD [facility]

## 2022-05-06 ENCOUNTER — Other Ambulatory Visit: Payer: Self-pay | Admitting: Family Medicine

## 2022-05-06 DIAGNOSIS — Z1231 Encounter for screening mammogram for malignant neoplasm of breast: Secondary | ICD-10-CM

## 2022-05-11 ENCOUNTER — Ambulatory Visit (INDEPENDENT_AMBULATORY_CARE_PROVIDER_SITE_OTHER): Payer: Medicare Other

## 2022-05-11 DIAGNOSIS — T63441D Toxic effect of venom of bees, accidental (unintentional), subsequent encounter: Secondary | ICD-10-CM | POA: Diagnosis not present

## 2022-05-12 ENCOUNTER — Ambulatory Visit (INDEPENDENT_AMBULATORY_CARE_PROVIDER_SITE_OTHER): Payer: Medicare Other | Admitting: Podiatry

## 2022-05-12 DIAGNOSIS — M79676 Pain in unspecified toe(s): Secondary | ICD-10-CM | POA: Diagnosis not present

## 2022-05-12 DIAGNOSIS — B351 Tinea unguium: Secondary | ICD-10-CM | POA: Diagnosis not present

## 2022-05-12 DIAGNOSIS — M7752 Other enthesopathy of left foot: Secondary | ICD-10-CM | POA: Diagnosis not present

## 2022-05-12 MED ORDER — BETAMETHASONE SOD PHOS & ACET 6 (3-3) MG/ML IJ SUSP
3.0000 mg | Freq: Once | INTRAMUSCULAR | Status: AC
  Administered 2022-05-12: 3 mg via INTRA_ARTICULAR
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
## 2022-05-29 ENCOUNTER — Ambulatory Visit: Payer: Medicare Other | Admitting: Obstetrics and Gynecology

## 2022-06-02 ENCOUNTER — Ambulatory Visit: Payer: Medicare Other | Admitting: Rheumatology
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
## 2022-06-19 ENCOUNTER — Ambulatory Visit
Admission: RE | Admit: 2022-06-19 | Discharge: 2022-06-19 | Disposition: A | Discharge status: Home / Self Care | Payer: Medicare Other | Source: Ambulatory Visit | Attending: Family Medicine | Admitting: Family Medicine

## 2022-06-19 DIAGNOSIS — Z1231 Encounter for screening mammogram for malignant neoplasm of breast: Secondary | ICD-10-CM | POA: Diagnosis not present

## 2022-06-22 ENCOUNTER — Ambulatory Visit (INDEPENDENT_AMBULATORY_CARE_PROVIDER_SITE_OTHER): Payer: Medicare Other

## 2022-06-22 DIAGNOSIS — T63441D Toxic effect of venom of bees, accidental (unintentional), subsequent encounter: Secondary | ICD-10-CM

## 2022-06-25 ENCOUNTER — Encounter: Payer: Self-pay | Admitting: Physician Assistant

## 2022-06-25 ENCOUNTER — Ambulatory Visit: Payer: Medicare Other | Attending: Rheumatology | Admitting: Physician Assistant

## 2022-06-25 VITALS — BP 149/80 | HR 53 | Resp 14 | Ht 62.0 in | Wt 152.2 lb

## 2022-06-25 DIAGNOSIS — M19041 Primary osteoarthritis, right hand: Secondary | ICD-10-CM | POA: Diagnosis not present

## 2022-06-25 DIAGNOSIS — M19042 Primary osteoarthritis, left hand: Secondary | ICD-10-CM

## 2022-06-25 DIAGNOSIS — R2681 Unsteadiness on feet: Secondary | ICD-10-CM

## 2022-06-25 DIAGNOSIS — I10 Essential (primary) hypertension: Secondary | ICD-10-CM | POA: Diagnosis not present

## 2022-06-25 DIAGNOSIS — R7303 Prediabetes: Secondary | ICD-10-CM | POA: Diagnosis not present

## 2022-06-25 DIAGNOSIS — M5136 Other intervertebral disc degeneration, lumbar region: Secondary | ICD-10-CM | POA: Diagnosis not present

## 2022-06-25 DIAGNOSIS — I5189 Other ill-defined heart diseases: Secondary | ICD-10-CM | POA: Diagnosis not present

## 2022-06-25 DIAGNOSIS — Z96651 Presence of right artificial knee joint: Secondary | ICD-10-CM

## 2022-06-25 DIAGNOSIS — E559 Vitamin D deficiency, unspecified: Secondary | ICD-10-CM

## 2022-06-25 DIAGNOSIS — M810 Age-related osteoporosis without current pathological fracture: Secondary | ICD-10-CM | POA: Diagnosis not present

## 2022-06-25 DIAGNOSIS — Z5181 Encounter for therapeutic drug level monitoring: Secondary | ICD-10-CM

## 2022-06-25 DIAGNOSIS — M797 Fibromyalgia: Secondary | ICD-10-CM | POA: Diagnosis not present

## 2022-06-25 DIAGNOSIS — Z96611 Presence of right artificial shoulder joint: Secondary | ICD-10-CM

## 2022-06-25 NOTE — Patient Instructions (Addendum)
Please contact [facility] to schedule your bone density scan. Phone # [phone]   Hand Exercises Hand exercises can be helpful for almost anyone. These exercises can strengthen the hands, improve flexibility and movement, and increase blood flow to the hands. These results can make work and daily tasks easier. Hand exercises can be especially helpful for people who have joint pain from arthritis or have nerve damage from overuse (carpal tunnel syndrome). These exercises can also help people who have injured a hand. Exercises Most of these hand exercises are gentle stretching and motion exercises. It is usually safe to do them often throughout the day. Warming up your hands before exercise may help to reduce stiffness. You can do this with gentle massage or by placing your hands in warm water for 10-15 minutes. It is normal to feel some stretching, pulling, tightness, or mild discomfort as you begin new exercises. This will gradually improve. Stop an exercise right away if you feel sudden, severe pain or your pain gets worse. Ask your health care provider which exercises are best for you. Knuckle bend or "claw" fist  Stand or sit with your arm, hand, and all five fingers pointed straight up. Make sure to keep your wrist straight during the exercise. Gently bend your fingers down toward your palm until the tips of your fingers are touching the top of your palm. Keep your big knuckle straight and just bend the small knuckles in your fingers. Hold this position for __________ seconds. Straighten (extend) your fingers back to the starting position. Repeat this exercise 5-10 times with each hand. Full finger fist  Stand or sit with your arm, hand, and all five fingers pointed straight up. Make sure to keep your wrist straight during the exercise. Gently bend your fingers into your palm until the tips of your fingers are touching the middle of your palm. Hold this position for __________  seconds. Extend your fingers back to the starting position, stretching every joint fully. Repeat this exercise 5-10 times with each hand. Straight fist Stand or sit with your arm, hand, and all five fingers pointed straight up. Make sure to keep your wrist straight during the exercise. Gently bend your fingers at the big knuckle, where your fingers meet your hand, and the middle knuckle. Keep the knuckle at the tips of your fingers straight and try to touch the bottom of your palm. Hold this position for __________ seconds. Extend your fingers back to the starting position, stretching every joint fully. Repeat this exercise 5-10 times with each hand. Tabletop  Stand or sit with your arm, hand, and all five fingers pointed straight up. Make sure to keep your wrist straight during the exercise. Gently bend your fingers at the big knuckle, where your fingers meet your hand, as far down as you can while keeping the small knuckles in your fingers straight. Think of forming a tabletop with your fingers. Hold this position for __________ seconds. Extend your fingers back to the starting position, stretching every joint fully. Repeat this exercise 5-10 times with each hand. Finger spread  Place your hand flat on a table with your palm facing down. Make sure your wrist stays straight as you do this exercise. Spread your fingers and thumb apart from each other as far as you can until you feel a gentle stretch. Hold this position for __________ seconds. Bring your fingers and thumb tight together again. Hold this position for __________ seconds. Repeat this exercise 5-10 times with each hand.  Making circles  Stand or sit with your arm, hand, and all five fingers pointed straight up. Make sure to keep your wrist straight during the exercise. Make a circle by touching the tip of your thumb to the tip of your index finger. Hold for __________ seconds. Then open your hand wide. Repeat this motion with  your thumb and each finger on your hand. Repeat this exercise 5-10 times with each hand. Thumb motion  Sit with your forearm resting on a table and your wrist straight. Your thumb should be facing up toward the ceiling. Keep your fingers relaxed as you move your thumb. Lift your thumb up as high as you can toward the ceiling. Hold for __________ seconds. Bend your thumb across your palm as far as you can, reaching the tip of your thumb for the small finger (pinkie) side of your palm. Hold for __________ seconds. Repeat this exercise 5-10 times with each hand. Grip strengthening  Hold a stress ball or other soft ball in the middle of your hand. Slowly increase the pressure, squeezing the ball as much as you can without causing pain. Think of bringing the tips of your fingers into the middle of your palm. All of your finger joints should bend when doing this exercise. Hold your squeeze for __________ seconds, then relax. Repeat this exercise 5-10 times with each hand. Contact a health care provider if: Your hand pain or discomfort gets much worse when you do an exercise. Your hand pain or discomfort does not improve within 2 hours after you exercise. If you have any of these problems, stop doing these exercises right away. Do not do them again unless your health care provider says that you can. Get help right away if: You develop sudden, severe hand pain or swelling. If this happens, stop doing these exercises right away. Do not do them again unless your health care provider says that you can. This information is not intended to replace advice given to you by your health care provider. Make sure you discuss any questions you have with your health care provider. Document Revised: 01/16/2021 Document Reviewed: 01/16/2021 Elsevier Patient Education  [place].

## 2022-06-26 LAB — CBC WITH DIFFERENTIAL/PLATELET
Absolute Monocytes: 497 cells/uL (ref 200–950)
Basophils Absolute: 7 cells/uL (ref 0–200)
Basophils Relative: 0.1 %
Eosinophils Absolute: 90 cells/uL (ref 15–500)
Eosinophils Relative: 1.3 %
HCT: 36.5 % (ref 35.0–45.0)
Hemoglobin: 12.4 g/dL (ref 11.7–15.5)
Lymphs Abs: 2567 cells/uL (ref 850–3900)
MCH: 32.8 pg (ref 27.0–33.0)
MCHC: 34 g/dL (ref 32.0–36.0)
MCV: 96.6 fL (ref 80.0–100.0)
MPV: 10.5 fL (ref 7.5–12.5)
Monocytes Relative: 7.2 %
Neutro Abs: 3740 cells/uL (ref 1500–7800)
Neutrophils Relative %: 54.2 %
Platelets: 237 10*3/uL (ref 140–400)
RBC: 3.78 10*6/uL — ABNORMAL LOW (ref 3.80–5.10)
RDW: 12.3 % (ref 11.0–15.0)
Total Lymphocyte: 37.2 %
WBC: 6.9 10*3/uL (ref 3.8–10.8)

## 2022-06-26 LAB — COMPLETE METABOLIC PANEL WITH GFR
AG Ratio: 1.8 (calc) (ref 1.0–2.5)
ALT: 15 U/L (ref 6–29)
AST: 23 U/L (ref 10–35)
Albumin: 4.5 g/dL (ref 3.6–5.1)
Alkaline phosphatase (APISO): 54 U/L (ref 37–153)
BUN: 11 mg/dL (ref 7–25)
CO2: 27 mmol/L (ref 20–32)
Calcium: 9.8 mg/dL (ref 8.6–10.4)
Chloride: 99 mmol/L (ref 98–110)
Creat: 0.78 mg/dL (ref 0.60–0.95)
Globulin: 2.5 g/dL (calc) (ref 1.9–3.7)
Glucose, Bld: 96 mg/dL (ref 65–99)
Potassium: 4.4 mmol/L (ref 3.5–5.3)
Sodium: 134 mmol/L — ABNORMAL LOW (ref 135–146)
Total Bilirubin: 0.4 mg/dL (ref 0.2–1.2)
Total Protein: 7 g/dL (ref 6.1–8.1)
eGFR: 76 mL/min/{1.73_m2} (ref >=60)

## 2022-06-26 LAB — VITAMIN D 25 HYDROXY (VIT D DEFICIENCY, FRACTURES): Vit D, 25-Hydroxy: 35 ng/mL (ref 30–100)

## 2022-06-26 NOTE — Progress Notes (Signed)
Vitamin D WNL.  RBC count is borderline low but stable. Rest of CBC Wnl.   Sodium is borderline low. Rest of CMP WNL.

## 2022-07-08 DIAGNOSIS — M65312 Trigger thumb, left thumb: Secondary | ICD-10-CM | POA: Diagnosis not present

## 2022-07-20 ENCOUNTER — Ambulatory Visit: Payer: Medicare Other

## 2022-07-28 ENCOUNTER — Ambulatory Visit: Payer: Medicare Other | Admitting: Podiatry

## 2022-07-28 ENCOUNTER — Ambulatory Visit (INDEPENDENT_AMBULATORY_CARE_PROVIDER_SITE_OTHER): Payer: Medicare Other | Admitting: Obstetrics and Gynecology

## 2022-07-28 ENCOUNTER — Encounter: Payer: Self-pay | Admitting: Obstetrics and Gynecology

## 2022-07-28 ENCOUNTER — Ambulatory Visit (INDEPENDENT_AMBULATORY_CARE_PROVIDER_SITE_OTHER): Payer: Medicare Other | Admitting: Podiatry

## 2022-07-28 VITALS — BP 138/68

## 2022-07-28 DIAGNOSIS — N952 Postmenopausal atrophic vaginitis: Secondary | ICD-10-CM

## 2022-07-28 DIAGNOSIS — M7751 Other enthesopathy of right foot: Secondary | ICD-10-CM | POA: Diagnosis not present

## 2022-07-28 DIAGNOSIS — N8110 Cystocele, unspecified: Secondary | ICD-10-CM | POA: Diagnosis not present

## 2022-07-28 DIAGNOSIS — M7752 Other enthesopathy of left foot: Secondary | ICD-10-CM | POA: Diagnosis not present

## 2022-07-28 DIAGNOSIS — B351 Tinea unguium: Secondary | ICD-10-CM

## 2022-07-28 DIAGNOSIS — M79676 Pain in unspecified toe(s): Secondary | ICD-10-CM

## 2022-07-28 MED ORDER — ESTRADIOL 0.1 MG/GM VA CREA: 0.5000 g | TOPICAL_CREAM | VAGINAL | 11 refills | Status: DC

## 2022-07-28 MED ORDER — BETAMETHASONE SOD PHOS & ACET 6 (3-3) MG/ML IJ SUSP
3.0000 mg | Freq: Once | INTRAMUSCULAR | Status: AC
  Administered 2022-07-28: 3 mg via INTRA_ARTICULAR

## 2022-07-28 NOTE — Progress Notes (Signed)
[facility]   Subjective:     Chief Complaint:  Chief Complaint  Patient presents with   medication follow up    [place] - it is helping     History of Present Illness: Sherri Murray is a 81 y.o. female with stage III pelvic organ prolapse who presents for a pessary check. She is using a size #3 ring with support pessary. The pessary has been working well. She ran out of estrogen cream and has not used it in about 2 months.   Denies any issues with the pessary. She had a few days of vaginal discomfort but it resolved. She thinks maybe due to dryness.  OAB managed by [facility]. Has been taking Gemtesa and it is working well for her. Also has Interstim device.   Past Medical History: Patient  has a past medical history of Acute ischemic colitis ([place]) (02/26/2012), Acute posthemorrhagic anemia, Arthritis, Asthma, Chronic diarrhea, Chronic foot pain, Complication of anesthesia, Difficult intubation, Fecal incontinence, Fibromyalgia, GERD (gastroesophageal reflux disease), Headache, Heart murmur, Hyperlipidemia, Hypertriglyceridemia (02/2012), IBS (irritable bowel syndrome), Incontinence of urine, Internal hemorrhoids, Ischemic colitis ([place]) (02/2012), Sleep apnea, and Thyroid goiter.   Past Surgical History: She  has a past surgical history that includes Thyroid surgery; Tonsillectomy; Tubal ligation; Cataract extraction (Bilateral); Myomectomy; Colonoscopy (02/27/2012); liver biopsy (1980); Knee Arthroplasty (Right, 12/11/2015); Sacral nerve stimulator placement; Eye surgery; Total shoulder arthroplasty (Right, 02/05/2017); and Tooth extraction.   Medications: She has a current medication list which includes the following prescription(s): acetaminophen, albuterol, alendronate, atorvastatin, biotin, biotin w/ vitamins c & e, calcium carbonate-vitamin d, vitamin d, dicyclomine, diphenhydramine-acetaminophen, epinephrine, flaxseed (linseed), folic acid,  glucosamine-chondroitin, loperamide, losartan, melatonin, meloxicam, montelukast, centrum silver, multiple vitamins-minerals, nystatin cream, oxybutynin, pantoprazole, [START ON 07/30/2022] estradiol, and loratadine.   Allergies: Patient is allergic to apple juice, banana, barium-containing compounds, bee venom, celery oil, iodinated contrast media, ioxaglate, metrizamide, other, penicillins, strawberry extract, aspirin, barium sulfate, latex, licorice [glycyrrhiza], sulfa antibiotics, and etodolac.   Social History: Patient  reports that she has never smoked. She has never used smokeless tobacco. She reports current alcohol use of about 1.0 standard drink of alcohol per week. She reports that she does not use drugs.      Objective:    Physical Exam: BP 138/68 (BP Location: Left Arm, Patient Position: Sitting, Cuff Size: Small)  Gen: No apparent distress, A&O x 3. Detailed Urogynecologic Evaluation:   Pelvic Exam: Normal external female genitalia, erythema noted on external vulva; Bartholin's and Skene's glands normal in appearance; urethral meatus normal in appearance, no urethral masses or discharge. The pessary was noted to be in place. It was removed and cleaned. Speculum exam revealed no lesions in the vagina. The pessary was replaced. It was comfortable to the patient and fit well.   POP-Q:    POP-Q   2                                            Aa   2                                           Ba   -6.5  C    4                                            Gh   3                                            Pb   8                                            tvl    -1                                            Ap   -1                                            Bp   -6                                              D     Assessment/Plan:    Assessment: Sherri Murray is a 81 y.o. with stage III pelvic organ prolapse and OAB and bowel  leakage here for a pessary check.   Plan: POP - #3 ring with support pessary - clean once every 3 months - Refill for vaginal estrogen provided. Use twice weekly.   2. OAB - continue management with [place] and Interstim  Return 3 months  [name], MD   Time Spent: Time spent: I spent 20 minutes dedicated to the care of this patient on the date of this encounter to include pre-visit review of records, face-to-face time with the patient  and post visit documentation.
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
## 2022-08-05 DIAGNOSIS — M79642 Pain in left hand: Secondary | ICD-10-CM | POA: Diagnosis not present

## 2022-08-05 DIAGNOSIS — M79641 Pain in right hand: Secondary | ICD-10-CM | POA: Diagnosis not present

## 2022-08-11 ENCOUNTER — Ambulatory Visit (INDEPENDENT_AMBULATORY_CARE_PROVIDER_SITE_OTHER): Payer: Medicare Other | Admitting: *Deleted

## 2022-08-11 ENCOUNTER — Ambulatory Visit: Payer: Medicare Other | Admitting: Podiatry

## 2022-08-11 DIAGNOSIS — T63441D Toxic effect of venom of bees, accidental (unintentional), subsequent encounter: Secondary | ICD-10-CM

## 2022-09-04 DIAGNOSIS — M19041 Primary osteoarthritis, right hand: Secondary | ICD-10-CM | POA: Diagnosis not present

## 2022-09-04 DIAGNOSIS — M19042 Primary osteoarthritis, left hand: Secondary | ICD-10-CM | POA: Diagnosis not present

## 2022-09-04 DIAGNOSIS — M180 Bilateral primary osteoarthritis of first carpometacarpal joints: Secondary | ICD-10-CM | POA: Diagnosis not present

## 2022-09-08 ENCOUNTER — Ambulatory Visit: Payer: Medicare Other
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
## 2022-09-24 ENCOUNTER — Ambulatory Visit: Payer: Medicare Other | Admitting: Physician Assistant

## 2022-09-24 DIAGNOSIS — R7303 Prediabetes: Secondary | ICD-10-CM

## 2022-09-24 DIAGNOSIS — M5136 Other intervertebral disc degeneration, lumbar region: Secondary | ICD-10-CM

## 2022-09-24 DIAGNOSIS — E559 Vitamin D deficiency, unspecified: Secondary | ICD-10-CM

## 2022-09-24 DIAGNOSIS — Z96651 Presence of right artificial knee joint: Secondary | ICD-10-CM

## 2022-09-24 DIAGNOSIS — I10 Essential (primary) hypertension: Secondary | ICD-10-CM

## 2022-09-24 DIAGNOSIS — M810 Age-related osteoporosis without current pathological fracture: Secondary | ICD-10-CM

## 2022-09-24 DIAGNOSIS — R2681 Unsteadiness on feet: Secondary | ICD-10-CM

## 2022-09-24 DIAGNOSIS — M797 Fibromyalgia: Secondary | ICD-10-CM

## 2022-09-24 DIAGNOSIS — Z96611 Presence of right artificial shoulder joint: Secondary | ICD-10-CM

## 2022-09-24 DIAGNOSIS — M19042 Primary osteoarthritis, left hand: Secondary | ICD-10-CM

## 2022-09-24 DIAGNOSIS — I5189 Other ill-defined heart diseases: Secondary | ICD-10-CM

## 2022-10-02 ENCOUNTER — Other Ambulatory Visit: Payer: Self-pay | Admitting: Physician Assistant
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
## 2022-10-19 DIAGNOSIS — M19042 Primary osteoarthritis, left hand: Secondary | ICD-10-CM | POA: Diagnosis not present

## 2022-10-19 DIAGNOSIS — M180 Bilateral primary osteoarthritis of first carpometacarpal joints: Secondary | ICD-10-CM | POA: Diagnosis not present

## 2022-10-27 ENCOUNTER — Ambulatory Visit: Payer: 59 | Admitting: Podiatry

## 2022-10-27 ENCOUNTER — Ambulatory Visit: Payer: 59 | Admitting: Physician Assistant

## 2022-10-27 DIAGNOSIS — Z5181 Encounter for therapeutic drug level monitoring: Secondary | ICD-10-CM

## 2022-10-27 DIAGNOSIS — M810 Age-related osteoporosis without current pathological fracture: Secondary | ICD-10-CM

## 2022-10-27 DIAGNOSIS — M5136 Other intervertebral disc degeneration, lumbar region: Secondary | ICD-10-CM

## 2022-10-27 DIAGNOSIS — M797 Fibromyalgia: Secondary | ICD-10-CM

## 2022-10-27 DIAGNOSIS — R2681 Unsteadiness on feet: Secondary | ICD-10-CM

## 2022-10-27 DIAGNOSIS — I10 Essential (primary) hypertension: Secondary | ICD-10-CM

## 2022-10-27 DIAGNOSIS — R7303 Prediabetes: Secondary | ICD-10-CM

## 2022-10-27 DIAGNOSIS — E559 Vitamin D deficiency, unspecified: Secondary | ICD-10-CM

## 2022-10-27 DIAGNOSIS — Z96611 Presence of right artificial shoulder joint: Secondary | ICD-10-CM

## 2022-10-27 DIAGNOSIS — Z96651 Presence of right artificial knee joint: Secondary | ICD-10-CM

## 2022-10-27 DIAGNOSIS — I5189 Other ill-defined heart diseases: Secondary | ICD-10-CM

## 2022-10-27 DIAGNOSIS — M19041 Primary osteoarthritis, right hand: Secondary | ICD-10-CM

## 2022-10-28 ENCOUNTER — Encounter: Payer: Self-pay | Admitting: Obstetrics and Gynecology

## 2022-10-28 ENCOUNTER — Ambulatory Visit: Payer: Medicare Other | Admitting: Obstetrics and Gynecology

## 2022-10-28 ENCOUNTER — Ambulatory Visit (INDEPENDENT_AMBULATORY_CARE_PROVIDER_SITE_OTHER): Payer: 59 | Admitting: Obstetrics and Gynecology

## 2022-10-28 VITALS — BP 157/78 | HR 57

## 2022-10-28 DIAGNOSIS — N952 Postmenopausal atrophic vaginitis: Secondary | ICD-10-CM

## 2022-10-28 DIAGNOSIS — Z4689 Encounter for fitting and adjustment of other specified devices: Secondary | ICD-10-CM

## 2022-10-28 DIAGNOSIS — N816 Rectocele: Secondary | ICD-10-CM | POA: Diagnosis not present

## 2022-10-28 DIAGNOSIS — N8110 Cystocele, unspecified: Secondary | ICD-10-CM

## 2022-10-28 NOTE — Patient Instructions (Addendum)
Please use estrogen cream twice a week for vaginal atrophy.   Follow up with your GI doctor if you are wanting a colonscopy, but your CT scan from last year did not show any significant abnormalities.

## 2022-10-28 NOTE — Progress Notes (Addendum)
[facility]   Subjective:     Chief Complaint: No chief complaint on file.  History of Present Illness: Sherri Murray is a 82 y.o. female with stage III pelvic organ prolapse who presents for a pessary check. She is using a size #3  ring with support pessary. The pessary has been working well and she has no complaints. She is not using vaginal estrogen. She denies vaginal bleeding.  She reports she has been forgetting to use her vaginal estrogen.   Is also concerned that her interstim is not working enough to help her symptoms, she is managed by [facility] with this and her [place]  Also has concerns regarding her constipation and stool burden and was inquiring about getting a coloscopy.   Past Medical History: Patient  has a past medical history of Acute ischemic colitis (Sherri Murray) (02/26/2012), Acute posthemorrhagic anemia, Arthritis, Asthma, Chronic diarrhea, Chronic foot pain, Complication of anesthesia, Difficult intubation, Fecal incontinence, Fibromyalgia, GERD (gastroesophageal reflux disease), Headache, Heart murmur, Hyperlipidemia, Hypertriglyceridemia (02/2012), IBS (irritable bowel syndrome), Incontinence of urine, Internal hemorrhoids, Ischemic colitis (Sherri Murray) (02/2012), Sleep apnea, and Thyroid goiter.   Past Surgical History: She  has a past surgical history that includes Thyroid surgery; Tonsillectomy; Tubal ligation; Cataract extraction (Bilateral); Myomectomy; Colonoscopy (02/27/2012); liver biopsy (1980); Knee Arthroplasty (Right, 12/11/2015); Sacral nerve stimulator placement; Eye surgery; Total shoulder arthroplasty (Right, 02/05/2017); and Tooth extraction.   Medications: She has a current medication list which includes the following prescription(s): acetaminophen, albuterol, alendronate, atorvastatin, biotin, biotin w/ vitamins c & e, calcium carbonate-vitamin d, vitamin d, dicyclomine, diphenhydramine-acetaminophen, epinephrine, estradiol,  flaxseed (linseed), folic acid, glucosamine-chondroitin, loperamide, loratadine, losartan, melatonin, meloxicam, montelukast, centrum silver, multiple vitamins-minerals, nystatin cream, oxybutynin, and pantoprazole.   Allergies: Patient is allergic to apple juice, banana, barium-containing compounds, bee venom, celery oil, iodinated contrast media, ioxaglate, metrizamide, other, penicillins, strawberry extract, aspirin, barium sulfate, latex, licorice [glycyrrhiza], sulfa antibiotics, and etodolac.   Social History: Patient  reports that she has never smoked. She has never used smokeless tobacco. She reports current alcohol use of about 1.0 standard drink of alcohol per week. She reports that she does not use drugs.      Objective:    Physical Exam: There were no vitals taken for this visit. Gen: No apparent distress, A&O x 3. Detailed Urogynecologic Evaluation:  Pelvic Exam: Normal external female genitalia; Bartholin's and Skene's glands normal in appearance; urethral meatus with caruncle, no urethral masses or discharge. The pessary was noted to be in place. It was removed and cleaned. Speculum exam revealed erythema in the vagina. The pessary was replaced. It was comfortable to the patient and fit well.     Assessment/Plan:    Assessment: Sherri Murray is a 82 y.o. with stage III pelvic organ prolapse here for a pessary check. She is doing well.  Plan: She will keep the pessary in place until next visit. She will continue to use estrogen.  Encouraged her to speak to her GI doctor if she would like a colonscopy as she has not seen them since 2019.   Also encouraged her to follow up with [facility] as that is who manages her interstim and her [place]   She will follow-up in 4 months for a pessary check or sooner as needed.   All questions were answered.

## 2022-11-19 ENCOUNTER — Ambulatory Visit (INDEPENDENT_AMBULATORY_CARE_PROVIDER_SITE_OTHER): Payer: 59

## 2022-11-19 VITALS — Wt 152.0 lb

## 2022-11-19 DIAGNOSIS — Z0000 Encounter for general adult medical examination without abnormal findings: Secondary | ICD-10-CM | POA: Diagnosis not present

## 2022-11-19 NOTE — Patient Instructions (Signed)
Ms. Tripathi , Thank you for taking time to come for your Medicare Wellness Visit. I appreciate your ongoing commitment to your health goals. Please review the following plan we discussed and let me know if I can assist you in the future.   These are the goals we discussed:  Goals       Follow up with Primary Care Provider      Starting 11/09/2018, I will continue to take medications as prescribed and to keep appointment with PCP as scheduled.       Patient Stated      11/15/2019, I will maintain and continue medications as prescribed.       Patient Stated      11/15/2020, I will continue to do strength training exercises at home everyday for about 20 minutes.       Patient Stated      Would like to drink more water, eat healthier and exercise a little more      Patient Stated      Patient Stated      Patient Stated      To improve on better health       Prevent falls (pt-stated)      CLIENT/RN ACTION PLAN - [facility] PREVENTION  Registered Nurse:  Reginia Naas RN Date: September 26, 2019  Client Name:[name] Client ID:    Target Area:  FALL PREVENTION    Why Problem May Occur:  Disorganization in home with recent family changes  Disorganization with medications for pain Chronic generalized pain Previous Falls Urine/ bowel Incontinence   Target Goal:   STRATEGIES Coping Strategies Ideas  Change Positions Slowly: lying to sitting, sitting to standing Changing positions can make people lightheaded. When getting up in the morning sit for a few minutes, before standing.  Stand for a few minutes before walking and hold on to sturdy furniture or countertop.  Don't rush  Visualize completion of activities in your mind before you begin them  Drink water Dehydration can make people dizzy. Ask your healthcare provider how much water you can drink. Decrease caffeine, caffeine makes you urinate a lot, which can make you dehydrated Although you enjoy coffee, you may want to consider  trying "half-caff" coffee products to help with urinary incontinence  If you fall, tell someone Tell a friend or family member even if you didn't get hurt. Tell your healthcare provider if you fall.  They can help you figure out why. Use your health calendar to write down any falls your experience and why you fell  Get your vision and hearing checked Poor vision and hearing loss can make people fall. Glaucoma and diabetes can cause poor vision. Make annual appointment for eye exam Consider making appointment for hearing exam  Other    Prevention Ideas  Review your Medicines Many medicines can make you dizzy or sleepy and increase your risk of falling. Your Aging Gracefully Nurse will look at your medications and see if you are taking any that might cause falling. Discuss your use of over-the counter (OTC) medications for pain relief with your PCP; ask her which pain medication is best for the type of pain you have Start by trying one OTC medication before you take others, so you know what helps and what does not help Consider asking your pharmacy about blister-packaging for your regularly prescribed medications so they are more organized  Activity and Exercise Start performing Aging Gracefully exercises that we reviewed during our visit Walking (inside or  outside)- always wear shoes with good soles that are not slick on the bottom [employer]:  cooking, cleaning, laundry Exercise while watching TV Swimming or water aerobics. Plan for changes in weather ahead of time and dress accordingly Start new exercise programs by beginning conservatively and gradually increasing over time  Strengthen Bones Exercise makes your bones stronger. Vitamin D and Calcium make your bones stronger.  Ask your Healthcare provider if you should be taking them. Your body makes vitamin D from the sun.  Sit in the sun for 10 minutes every day (without sunscreen).   Control your urine  Prevent  constipation Cut back on caffeinated drinks. Don't wait to urinate, but do not rush to get to bathroom; try to go to bathroom before you feel urgency. If diabetic, control your blood sugar. Ask your Aging Gracefully Nurse and Healthcare Provider  about urine control.  Tell your PCP that you are not sure if your medication for urine incontinence is working   Control your blood sugar (if you are diabetic) High blood sugar can cause frequent urination, poor vision, and numbness in your feet, which can make you fall.   Low blood sugar can cause confusion and dizziness.   Other coping strategies Continue taking warm showers to help with chronic pain 2.  Consider "backing truck in" your driveway, so you can pull straight out of the drive and do not strain your neck and cause more pain, which increases your fall risk 3. 4. 5.  PRACTICE It is important to practice the strategies so we can determine if they will be effective in helping to reach the goal.    Follow these specific recommendations: 10/20/19 brainstorming in red text; 11/10/19 brainstorming in blue text  -- Don't rush  -- Visualize completion of activities in your mind before you begin them       -- Although you enjoy coffee, you may want to consider trying "half-caff" coffee     products to help with urinary incontinence       -- Use your health calendar to write down any falls your experience and why you fell -- Make annual appointment for eye exam -- Consider making appointment for hearing exam -- Discuss your use of over-the counter (OTC) medications for pain relief with your PCP; ask her which pain medication is best for the type of pain you have -- Start by trying one OTC medication before you take others, so you know what helps and what does not help -- Consider asking your pharmacy about blister-packaging for your regularly prescribed medications so they are more organized -- Continue performing Aging Gracefully exercises that we  reviewed during our first visit-- gradually increase the time you perform exercises each week, and try to incorporate into your regular routine each week -- Walking (inside or outside)- always wear shoes with good soles that are not slick on the bottom -- 11/10/2019: consider taking your cane with you and beginning to use your cane to prevent falls and tripping -- Plan for changes in weather ahead of time and dress accordingly -- Start new exercise programs by beginning conservatively and gradually increasing over time -- Don't wait to urinate, but do not rush to get to bathroom; try to go to bathroom before you feel urgency -- Tell your PCP that you are not sure if your medication for urine incontinence is working -- Continue taking warm showers to help with chronic pain -- Consider "backing truck in" your driveway, so you  can pull straight out of the drive and do not strain your neck and cause more pain, which increases your fall risk : 10/20/2019: Good job at starting this practice- I'm glad to hear it is helping! -- Consider setting your alarm 5 minutes early each day so you are able to stretch before getting out of bed and do not have to rush when getting out of bed -- Keep using the Assistive Equipment that have been provided/ reviewed by the Occupational Therapist:  I'm glad to hear these are helping! -- Continue efforts to modify your home furnishing so that you have clear pathways     If strategy does not work the first time, try it again.     We may make some changes over the next few sessions.      Oneta Rack, RN, BSN, [employer] Coordinator [facility]  [phone]          This is a list of the screening recommended for you and due dates:  Health Maintenance  Topic Date Due   Zoster (Shingles) Vaccine (2 of 2) 03/01/2020   DTaP/Tdap/Td vaccine (2 - Tdap) 10/12/2020   Flu Shot  05/12/2022   COVID-19 Vaccine (5 - 2023-24 season)  06/12/2022   Mammogram  06/20/2023   Medicare Annual Wellness Visit  11/20/2023   Pneumonia Vaccine  Completed   DEXA scan (bone density measurement)  Completed   HPV Vaccine  Aged Out    Advanced directives: Advance directive discussed with you today. Even though you declined this today please call our office should you change your mind and we can give you the proper paperwork for you to fill out.  Conditions/risks identified: To improve on better health   Next appointment: Follow up in one year for your annual wellness visit    Preventive Care 65 Years and Older, Female Preventive care refers to lifestyle choices and visits with your health care provider that can promote health and wellness. What does preventive care include? A yearly physical exam. This is also called an annual well check. Dental exams once or twice a year. Routine eye exams. Ask your health care provider how often you should have your eyes checked. Personal lifestyle choices, including: Daily care of your teeth and gums. Regular physical activity. Eating a healthy diet. Avoiding tobacco and drug use. Limiting alcohol use. Practicing safe sex. Taking low-dose aspirin every day. Taking vitamin and mineral supplements as recommended by your health care provider. What happens during an annual well check? The services and screenings done by your health care provider during your annual well check will depend on your age, overall health, lifestyle risk factors, and family history of disease. Counseling  Your health care provider may ask you questions about your: Alcohol use. Tobacco use. Drug use. Emotional well-being. Home and relationship well-being. Sexual activity. Eating habits. History of falls. Memory and ability to understand (cognition). Work and work Statistician. Reproductive health. Screening  You may have the following tests or measurements: Height, weight, and BMI. Blood pressure. Lipid and  cholesterol levels. These may be checked every 5 years, or more frequently if you are over 63 years old. Skin check. Lung cancer screening. You may have this screening every year starting at age 50 if you have a 30-pack-year history of smoking and currently smoke or have quit within the past 15 years. Fecal occult blood test (FOBT) of the stool. You may have this test every year starting at age  50. Flexible sigmoidoscopy or colonoscopy. You may have a sigmoidoscopy every 5 years or a colonoscopy every 10 years starting at age 77. Hepatitis C blood test. Hepatitis B blood test. Sexually transmitted disease (STD) testing. Diabetes screening. This is done by checking your blood sugar (glucose) after you have not eaten for a while (fasting). You may have this done every 1-3 years. Bone density scan. This is done to screen for osteoporosis. You may have this done starting at age 31. Mammogram. This may be done every 1-2 years. Talk to your health care provider about how often you should have regular mammograms. Talk with your health care provider about your test results, treatment options, and if necessary, the need for more tests. Vaccines  Your health care provider may recommend certain vaccines, such as: Influenza vaccine. This is recommended every year. Tetanus, diphtheria, and acellular pertussis (Tdap, Td) vaccine. You may need a Td booster every 10 years. Zoster vaccine. You may need this after age 9. Pneumococcal 13-valent conjugate (PCV13) vaccine. One dose is recommended after age 40. Pneumococcal polysaccharide (PPSV23) vaccine. One dose is recommended after age 62. Talk to your health care provider about which screenings and vaccines you need and how often you need them. This information is not intended to replace advice given to you by your health care provider. Make sure you discuss any questions you have with your health care provider. Document Released: 10/25/2015 Document Revised:  06/17/2016 Document Reviewed: 07/30/2015 Elsevier Interactive Patient Education  2017 [place] Prevention in the Home Falls can cause injuries. They can happen to people of all ages. There are many things you can do to make your home safe and to help prevent falls. What can I do on the outside of my home? Regularly fix the edges of walkways and driveways and fix any cracks. Remove anything that might make you trip as you walk through a door, such as a raised step or threshold. Trim any bushes or trees on the path to your home. Use bright outdoor lighting. Clear any walking paths of anything that might make someone trip, such as rocks or tools. Regularly check to see if handrails are loose or broken. Make sure that both sides of any steps have handrails. Any raised decks and porches should have guardrails on the edges. Have any leaves, snow, or ice cleared regularly. Use sand or salt on walking paths during winter. Clean up any spills in your garage right away. This includes oil or grease spills. What can I do in the bathroom? Use night lights. Install grab bars by the toilet and in the tub and shower. Do not use towel bars as grab bars. Use non-skid mats or decals in the tub or shower. If you need to sit down in the shower, use a plastic, non-slip stool. Keep the floor dry. Clean up any water that spills on the floor as soon as it happens. Remove soap buildup in the tub or shower regularly. Attach bath mats securely with double-sided non-slip rug tape. Do not have throw rugs and other things on the floor that can make you trip. What can I do in the bedroom? Use night lights. Make sure that you have a light by your bed that is easy to reach. Do not use any sheets or blankets that are too big for your bed. They should not hang down onto the floor. Have a firm chair that has side arms. You can use this for support while you get  dressed. Do not have throw rugs and other things  on the floor that can make you trip. What can I do in the kitchen? Clean up any spills right away. Avoid walking on wet floors. Keep items that you use a lot in easy-to-reach places. If you need to reach something above you, use a strong step stool that has a grab bar. Keep electrical cords out of the way. Do not use floor polish or wax that makes floors slippery. If you must use wax, use non-skid floor wax. Do not have throw rugs and other things on the floor that can make you trip. What can I do with my stairs? Do not leave any items on the stairs. Make sure that there are handrails on both sides of the stairs and use them. Fix handrails that are broken or loose. Make sure that handrails are as long as the stairways. Check any carpeting to make sure that it is firmly attached to the stairs. Fix any carpet that is loose or worn. Avoid having throw rugs at the top or bottom of the stairs. If you do have throw rugs, attach them to the floor with carpet tape. Make sure that you have a light switch at the top of the stairs and the bottom of the stairs. If you do not have them, ask someone to add them for you. What else can I do to help prevent falls? Wear shoes that: Do not have high heels. Have rubber bottoms. Are comfortable and fit you well. Are closed at the toe. Do not wear sandals. If you use a stepladder: Make sure that it is fully opened. Do not climb a closed stepladder. Make sure that both sides of the stepladder are locked into place. Ask someone to hold it for you, if possible. Clearly mark and make sure that you can see: Any grab bars or handrails. First and last steps. Where the edge of each step is. Use tools that help you move around (mobility aids) if they are needed. These include: Canes. Walkers. Scooters. Crutches. Turn on the lights when you go into a dark area. Replace any light bulbs as soon as they burn out. Set up your furniture so you have a clear path. Avoid  moving your furniture around. If any of your floors are uneven, fix them. If there are any pets around you, be aware of where they are. Review your medicines with your doctor. Some medicines can make you feel dizzy. This can increase your chance of falling. Ask your doctor what other things that you can do to help prevent falls. This information is not intended to replace advice given to you by your health care provider. Make sure you discuss any questions you have with your health care provider. Document Released: 07/25/2009 Document Revised: 03/05/2016 Document Reviewed: 11/02/2014 Elsevier Interactive Patient Education  2017 [employer].
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
## 2022-11-20 ENCOUNTER — Telehealth: Payer: Self-pay | Admitting: Family Medicine

## 2022-11-20 ENCOUNTER — Other Ambulatory Visit (INDEPENDENT_AMBULATORY_CARE_PROVIDER_SITE_OTHER): Payer: 59

## 2022-11-20 DIAGNOSIS — E7800 Pure hypercholesterolemia, unspecified: Secondary | ICD-10-CM

## 2022-11-20 DIAGNOSIS — R7303 Prediabetes: Secondary | ICD-10-CM

## 2022-11-20 DIAGNOSIS — E890 Postprocedural hypothyroidism: Secondary | ICD-10-CM

## 2022-11-20 NOTE — Telephone Encounter (Signed)
-----   Message from Ellamae Sia sent at 11/20/2022  7:47 AM EST ----- Regarding: lab orders for today Lab orders for today, thanks

## 2022-11-20 NOTE — Addendum Note (Signed)
Addended by: Pilar Grammes on: 11/20/2022 02:47 PM   Modules accepted: Orders

## 2022-11-21 LAB — COMPREHENSIVE METABOLIC PANEL
AG Ratio: 1.8 (calc) (ref 1.0–2.5)
ALT: 11 U/L (ref 6–29)
AST: 19 U/L (ref 10–35)
Albumin: 4.2 g/dL (ref 3.6–5.1)
Alkaline phosphatase (APISO): 44 U/L (ref 37–153)
BUN: 11 mg/dL (ref 7–25)
CO2: 25 mmol/L (ref 20–32)
Calcium: 9.3 mg/dL (ref 8.6–10.4)
Chloride: 101 mmol/L (ref 98–110)
Creat: 0.72 mg/dL (ref 0.60–0.95)
Globulin: 2.4 g/dL (calc) (ref 1.9–3.7)
Glucose, Bld: 93 mg/dL (ref 65–99)
Potassium: 4.4 mmol/L (ref 3.5–5.3)
Sodium: 135 mmol/L (ref 135–146)
Total Bilirubin: 0.3 mg/dL (ref 0.2–1.2)
Total Protein: 6.6 g/dL (ref 6.1–8.1)

## 2022-11-21 LAB — HEMOGLOBIN A1C
Hgb A1c MFr Bld: 5.6 % of total Hgb (ref <5.7)
Mean Plasma Glucose: 114 mg/dL
eAG (mmol/L): 6.3 mmol/L

## 2022-11-21 LAB — TSH: TSH: 3.58 mIU/L (ref 0.40–4.50)

## 2022-11-21 LAB — T4, FREE: Free T4: 0.9 ng/dL (ref 0.8–1.8)

## 2022-11-21 LAB — LIPID PANEL
Cholesterol: 200 mg/dL — ABNORMAL HIGH (ref <200)
HDL: 65 mg/dL (ref >=50)
LDL Cholesterol (Calc): 113 mg/dL (calc) — ABNORMAL HIGH
Non-HDL Cholesterol (Calc): 135 mg/dL (calc) — ABNORMAL HIGH (ref <130)
Total CHOL/HDL Ratio: 3.1 (calc) (ref <5.0)
Triglycerides: 112 mg/dL (ref <150)

## 2022-11-21 LAB — T3, FREE: T3, Free: 2.8 pg/mL (ref 2.3–4.2)

## 2022-11-24 NOTE — Progress Notes (Signed)
No critical labs need to be addressed urgently. We will discuss labs in detail at upcoming office visit.   

## 2022-11-25 ENCOUNTER — Ambulatory Visit: Payer: Medicare Other | Admitting: Dermatology

## 2022-11-26 ENCOUNTER — Encounter: Payer: Self-pay | Admitting: Family Medicine

## 2022-11-26 ENCOUNTER — Ambulatory Visit (INDEPENDENT_AMBULATORY_CARE_PROVIDER_SITE_OTHER): Payer: 59 | Admitting: Family Medicine

## 2022-11-26 ENCOUNTER — Ambulatory Visit: Payer: 59

## 2022-11-26 VITALS — BP 120/80 | HR 66 | Temp 98.4°F | Ht 62.5 in | Wt 153.5 lb

## 2022-11-26 DIAGNOSIS — E890 Postprocedural hypothyroidism: Secondary | ICD-10-CM

## 2022-11-26 DIAGNOSIS — E7800 Pure hypercholesterolemia, unspecified: Secondary | ICD-10-CM | POA: Diagnosis not present

## 2022-11-26 DIAGNOSIS — R7303 Prediabetes: Secondary | ICD-10-CM | POA: Diagnosis not present

## 2022-11-26 DIAGNOSIS — R0789 Other chest pain: Secondary | ICD-10-CM

## 2022-11-26 DIAGNOSIS — I10 Essential (primary) hypertension: Secondary | ICD-10-CM

## 2022-11-26 DIAGNOSIS — Z23 Encounter for immunization: Secondary | ICD-10-CM

## 2022-11-26 DIAGNOSIS — Z0000 Encounter for general adult medical examination without abnormal findings: Secondary | ICD-10-CM | POA: Diagnosis not present

## 2022-11-26 MED ORDER — ATORVASTATIN CALCIUM 40 MG PO TABS: ORAL_TABLET | ORAL | 3 refills | Status: DC

## 2022-11-26 MED ORDER — PANTOPRAZOLE SODIUM 40 MG PO TBEC: 40.0000 mg | DELAYED_RELEASE_TABLET | Freq: Two times a day (BID) | ORAL | 3 refills | Status: DC

## 2022-11-26 NOTE — Assessment & Plan Note (Signed)
Stable, chronic.  Continue current medication.   on losartan  25 mg daily

## 2022-11-26 NOTE — Assessment & Plan Note (Signed)
Acute episode of chronic intermittent issue.  This sounds most likely GI related given patient has been off of pantoprazole's for several months.  Scanned in endoscopy from 2012 was difficult to read given poor copy.  Unclear if she had esophagitis in the past but this is likely given she has been on twice daily PPI.  I have encouraged her to avoid acid triggers and to restart pantoprazole 40 mg p.o. twice daily.  Prescription sent to pharmacy. No clear cardiopulmonary source of chest pain at this time. Of note she did have unremarkable stress echo within the last year.

## 2022-11-26 NOTE — Progress Notes (Signed)
Patient ID: Sherri Murray, female    DOB: [birth year], 82 y.o.   MRN: [id number]  This visit was conducted in person.  BP 120/80   Pulse 66   Temp 98.4 F (36.9 C) (Temporal)   Ht 5' 2.5" (1.588 m)   Wt 153 lb 8 oz (69.6 kg)   SpO2 97%   BMI 27.63 kg/m    CC:  Chief Complaint  Patient presents with   Annual Exam    Part 2    Subjective:   HPI: Sherri Murray is a 82 y.o. female presenting on 11/26/2022 for Annual Exam (Part 2)  The patient presents for  complete physical and review of chronic health problems. He/She also has the following acute concerns today:   She reports she is not feeling well.. " things are wacko" She has run out of pantoprazole few months ago Hx of GERD.Marland Kitchen she has been having a few episodes of chest pain  Wakes her up at night.  Clearing throat a lot, hoarse voice. Notes occ wheeze.   No exertional chest pain.  No SOB.   Saw Dr. Ali Lowe 11/24/2021  Dobutamine stress echo:  unremarkable   She does have allergies and asthma... no cough, no congestion.   OAB On oxybutynin  Hypertension:   Great control on losartan  25 mg daily BP Readings from Last 3 Encounters:  11/26/22 120/80  10/28/22 (!) 157/78  07/28/22 138/68   Using medication without problems or lightheadedness: none Chest pain with exertion: none, occ sharp pain in back, chest and in jaws.. wakes her up some when sleeping. Occurring intermittently in last She does have pantoprazole 40 mg daily Edema:none Short of breath:none Average home BPs: Other issues:  Prediabetes   Lab Results  Component Value Date   HGBA1C 5.6 11/20/2022    High cholesterol  At [facility] on atorvastatin 40 mg daily. Lab Results  Component Value Date   CHOL 200 (H) 11/20/2022   HDL 65 11/20/2022   LDLCALC 113 (H) 11/20/2022   LDLDIRECT 136.1 11/02/2013   TRIG 112 11/20/2022   CHOLHDL 3.1 11/20/2022    Hypothyroid, not on thyroid medication Lab Results  Component Value Date    TSH 3.58 11/20/2022     Fibromyalgia followed by Dr. Estanislado Pandy.   Hypothyroid.Marland Kitchen due for re-eval.        Relevant past medical, surgical, family and social history reviewed and updated as indicated. Interim medical history since our last visit reviewed. Allergies and medications reviewed and updated. Outpatient Medications Prior to Visit  Medication Sig Dispense Refill   acetaminophen (TYLENOL) 325 MG tablet Take 650 mg by mouth every 6 (six) hours as needed.     albuterol (VENTOLIN HFA) 108 (90 Base) MCG/ACT inhaler Inhale 2 puffs into the lungs every 6 (six) hours as needed for wheezing or shortness of breath. 8 g 2   alendronate (FOSAMAX) 70 MG tablet TAKE 1 TABLET (70 MG TOTAL) BY MOUTH ONCE A WEEK. TAKE WITH A FULL GLASS OF WATER ON AN EMPTY STOMACH. 4 tablet 0   Biotin 5000 MCG TABS Take 5,000 mcg by mouth daily.     Biotin w/ Vitamins C & E (HAIR/SKIN/NAILS PO) Take 1 tablet by mouth in the morning, at noon, and at bedtime.     Calcium Carbonate-Vitamin D 600-400 MG-UNIT chew tablet Chew 1 tablet by mouth daily.      Cholecalciferol (VITAMIN D PO) Take by mouth daily.     dicyclomine (BENTYL) 20 MG  tablet Take 1 tablet (20 mg total) by mouth 4 (four) times daily -  before meals and at bedtime. 360 tablet 3   diphenhydramine-acetaminophen (TYLENOL PM) 25-500 MG TABS tablet Take 1 tablet by mouth at bedtime as needed.     EPINEPHrine 0.3 mg/0.3 mL IJ SOAJ injection Inject 0.3 mg into the muscle as needed. 1 each 1   estradiol (ESTRACE) 0.1 MG/GM vaginal cream Place 0.5 g vaginally 2 (two) times a week. Place 0.5g nightly for two weeks then twice a week after 30 g 11   Flaxseed, Linseed, (FLAXSEED OIL PO) Take 1 capsule by mouth in the morning and at bedtime.     FOLIC ACID PO Take 1 tablet by mouth daily.      GLUCOSAMINE-CHONDROITIN PO Take 1 tablet by mouth 2 (two) times daily.     loperamide (IMODIUM) 2 MG capsule Take 2 mg by mouth as needed for diarrhea or loose stools.      losartan (COZAAR) 25 MG tablet TAKE 2 TABLETS BY MOUTH EVERY DAY 180 tablet 3   MELATONIN PO Take by mouth at bedtime.     meloxicam (MOBIC) 15 MG tablet TAKE 1 TABLET BY MOUTH EVERY DAY 90 tablet 0   montelukast (SINGULAIR) 10 MG tablet Take 1 tablet (10 mg total) by mouth at bedtime. 30 tablet 11   Multiple Vitamins-Minerals (CENTRUM SILVER) tablet Take 1 tablet by mouth daily.     Multiple Vitamins-Minerals (ZINC PO) Take 1 tablet by mouth daily.     nystatin cream (MYCOSTATIN) Apply To Affected Area Twice A Day As Needed 60 g 3   oxybutynin (DITROPAN-XL) 10 MG 24 hr tablet Take 10 mg by mouth daily.     atorvastatin (LIPITOR) 40 MG tablet TAKE 1 TABLET BY MOUTH EVERYDAY AT BEDTIME 90 tablet 3   pantoprazole (PROTONIX) 40 MG tablet TAKE 1 TABLET BY MOUTH TWICE A DAY 180 tablet 3   loratadine (CLARITIN) 10 MG tablet Take 1 tablet (10 mg total) by mouth daily for 30 doses. 30 tablet 11   No facility-administered medications prior to visit.     Per HPI unless specifically indicated in ROS section below Review of Systems  Constitutional:  Negative for fatigue and fever.  HENT:  Negative for congestion.   Eyes:  Negative for pain.  Respiratory:  Negative for cough and shortness of breath.   Cardiovascular:  Negative for chest pain, palpitations and leg swelling.  Gastrointestinal:  Negative for abdominal pain.  Genitourinary:  Negative for dysuria and vaginal bleeding.  Musculoskeletal:  Negative for back pain.  Neurological:  Negative for syncope, light-headedness and headaches.  Psychiatric/Behavioral:  Negative for dysphoric mood.    Objective:  BP 120/80   Pulse 66   Temp 98.4 F (36.9 C) (Temporal)   Ht 5' 2.5" (1.588 m)   Wt 153 lb 8 oz (69.6 kg)   SpO2 97%   BMI 27.63 kg/m   Wt Readings from Last 3 Encounters:  11/26/22 153 lb 8 oz (69.6 kg)  11/19/22 152 lb (68.9 kg)  06/25/22 152 lb 3.2 oz (69 kg)      Physical Exam Constitutional:      General: She is not in  acute distress.    Appearance: Normal appearance. She is well-developed. She is not ill-appearing or toxic-appearing.  HENT:     Head: Normocephalic.     Right Ear: Hearing, tympanic membrane, ear canal and external ear normal. Tympanic membrane is not erythematous, retracted or bulging.  Left Ear: Hearing, tympanic membrane, ear canal and external ear normal. Tympanic membrane is not erythematous, retracted or bulging.     Nose: No mucosal edema or rhinorrhea.     Right Sinus: No maxillary sinus tenderness or frontal sinus tenderness.     Left Sinus: No maxillary sinus tenderness or frontal sinus tenderness.     Mouth/Throat:     Pharynx: Uvula midline.  Eyes:     General: Lids are normal. Lids are everted, no foreign bodies appreciated.     Conjunctiva/sclera: Conjunctivae normal.     Pupils: Pupils are equal, round, and reactive to light.  Neck:     Thyroid: No thyroid mass or thyromegaly.     Vascular: No carotid bruit.     Trachea: Trachea normal.  Cardiovascular:     Rate and Rhythm: Normal rate and regular rhythm.     Pulses: Normal pulses.     Heart sounds: Normal heart sounds, S1 normal and S2 normal. No murmur heard.    No friction rub. No gallop.  Pulmonary:     Effort: Pulmonary effort is normal. No tachypnea or respiratory distress.     Breath sounds: Normal breath sounds. No decreased breath sounds, wheezing, rhonchi or rales.  Abdominal:     General: Bowel sounds are normal.     Palpations: Abdomen is soft.     Tenderness: There is no abdominal tenderness.  Musculoskeletal:     Cervical back: Normal range of motion and neck supple.  Skin:    General: Skin is warm and dry.     Findings: No rash.  Neurological:     Mental Status: She is alert and oriented to person, place, and time.     GCS: GCS eye subscore is 4. GCS verbal subscore is 5. GCS motor subscore is 6.     Cranial Nerves: No cranial nerve deficit.     Sensory: No sensory deficit.     Motor: No  abnormal muscle tone.     Coordination: Coordination normal.     Gait: Gait normal.     Deep Tendon Reflexes: Reflexes are normal and symmetric.     Comments: Nml cerebellar exam   No papilledema  Psychiatric:        Mood and Affect: Mood is not anxious or depressed.        Speech: Speech normal.        Behavior: Behavior normal. Behavior is cooperative.        Thought Content: Thought content normal.        Cognition and Memory: Memory is not impaired. She does not exhibit impaired recent memory or impaired remote memory.        Judgment: Judgment normal.       Results for orders placed or performed in visit on 11/20/22  Lipid panel  Result Value Ref Range   Cholesterol 200 (H) <200 mg/dL   HDL 65 > OR = 50 mg/dL   Triglycerides 112 <150 mg/dL   LDL Cholesterol (Calc) 113 (H) mg/dL (calc)   Total CHOL/HDL Ratio 3.1 <5.0 (calc)   Non-HDL Cholesterol (Calc) 135 (H) <130 mg/dL (calc)  Comprehensive metabolic panel  Result Value Ref Range   Glucose, Bld 93 65 - 99 mg/dL   BUN 11 7 - 25 mg/dL   Creat 0.72 0.60 - 0.95 mg/dL   BUN/Creatinine Ratio SEE NOTE: 6 - 22 (calc)   Sodium 135 135 - 146 mmol/L   Potassium 4.4 3.5 - 5.3 mmol/L  Chloride 101 98 - 110 mmol/L   CO2 25 20 - 32 mmol/L   Calcium 9.3 8.6 - 10.4 mg/dL   Total Protein 6.6 6.1 - 8.1 g/dL   Albumin 4.2 3.6 - 5.1 g/dL   Globulin 2.4 1.9 - 3.7 g/dL (calc)   AG Ratio 1.8 1.0 - 2.5 (calc)   Total Bilirubin 0.3 0.2 - 1.2 mg/dL   Alkaline phosphatase (APISO) 44 37 - 153 U/L   AST 19 10 - 35 U/L   ALT 11 6 - 29 U/L  TSH  Result Value Ref Range   TSH 3.58 0.40 - 4.50 mIU/L  T4, free  Result Value Ref Range   Free T4 0.9 0.8 - 1.8 ng/dL  T3, free  Result Value Ref Range   T3, Free 2.8 2.3 - 4.2 pg/mL  Hemoglobin A1c  Result Value Ref Range   Hgb A1c MFr Bld 5.6 <5.7 % of total Hgb   Mean Plasma Glucose 114 mg/dL   eAG (mmol/L) 6.3 mmol/L    This visit occurred during the SARS-CoV-2 public health emergency.   Safety protocols were in place, including screening questions prior to the visit, additional usage of staff PPE, and extensive cleaning of exam room while observing appropriate contact time as indicated for disinfecting solutions.   COVID 19 screen:  No recent travel or known exposure to COVID19 The patient denies respiratory symptoms of COVID 19 at this time. The importance of social distancing was discussed today.   Assessment and Plan The patient's preventative maintenance and recommended screening tests for an annual wellness exam were reviewed in full today. Brought up to date unless services declined.  Counselled on the importance of diet, exercise, and its role in overall health and mortality. The patient's FH and SH was reviewed, including their home life, tobacco status, and drug and alcohol status.    Vaccine: COVID x 3, S/P PNA, She reports she shingrix x 1, but never got the second.  Plans yearly mammogram...  06/2022 nml  Colon: not indicated at age 69.  DEXA 2020 osteoporosis on fosamax..  due.. per pt done 1-2 months ago  Problem List Items Addressed This Visit     Atypical chest pain    Acute episode of chronic intermittent issue.  This sounds most likely GI related given patient has been off of pantoprazole's for several months.  Scanned in endoscopy from 2012 was difficult to read given poor copy.  Unclear if she had esophagitis in the past but this is likely given she has been on twice daily PPI.  I have encouraged her to avoid acid triggers and to restart pantoprazole 40 mg p.o. twice daily.  Prescription sent to pharmacy. No clear cardiopulmonary source of chest pain at this time. Of note she did have unremarkable stress echo within the last year.      Benign essential HTN    Stable, chronic.  Continue current medication.   on losartan  25 mg daily      Relevant Medications   atorvastatin (LIPITOR) 40 MG tablet   High cholesterol   Relevant Medications    atorvastatin (LIPITOR) 40 MG tablet   Post-surgical hypothyroidism    Stable thyroid function tests on no thyroid medication.      Prediabetes   Other Visit Diagnoses     Routine general medical examination at a health care facility    -  Primary   Need for influenza vaccination       Relevant Orders  Flu Vaccine QUAD High Dose(Fluad) (Completed)       Eliezer Lofts, MD

## 2022-11-26 NOTE — Assessment & Plan Note (Signed)
Stable thyroid function tests on no thyroid medication.

## 2022-11-26 NOTE — Patient Instructions (Signed)
Restart pantoprazole. Avoid trigger for acid  Call if atypical chest pain  is not improving as expected.

## 2022-12-03 DIAGNOSIS — M180 Bilateral primary osteoarthritis of first carpometacarpal joints: Secondary | ICD-10-CM | POA: Diagnosis not present

## 2022-12-03 DIAGNOSIS — M19042 Primary osteoarthritis, left hand: Secondary | ICD-10-CM | POA: Diagnosis not present

## 2022-12-03 DIAGNOSIS — M19041 Primary osteoarthritis, right hand: Secondary | ICD-10-CM | POA: Diagnosis not present

## 2022-12-09 ENCOUNTER — Ambulatory Visit (INDEPENDENT_AMBULATORY_CARE_PROVIDER_SITE_OTHER): Payer: 59 | Admitting: Dermatology

## 2022-12-09 VITALS — BP 133/77 | HR 58

## 2022-12-09 DIAGNOSIS — Z79899 Other long term (current) drug therapy: Secondary | ICD-10-CM | POA: Diagnosis not present

## 2022-12-09 DIAGNOSIS — L821 Other seborrheic keratosis: Secondary | ICD-10-CM | POA: Diagnosis not present

## 2022-12-09 DIAGNOSIS — Z1283 Encounter for screening for malignant neoplasm of skin: Secondary | ICD-10-CM | POA: Diagnosis not present

## 2022-12-09 DIAGNOSIS — Z7189 Other specified counseling: Secondary | ICD-10-CM | POA: Diagnosis not present

## 2022-12-09 DIAGNOSIS — D229 Melanocytic nevi, unspecified: Secondary | ICD-10-CM

## 2022-12-09 DIAGNOSIS — L578 Other skin changes due to chronic exposure to nonionizing radiation: Secondary | ICD-10-CM

## 2022-12-09 DIAGNOSIS — L814 Other melanin hyperpigmentation: Secondary | ICD-10-CM | POA: Diagnosis not present

## 2022-12-09 DIAGNOSIS — L820 Inflamed seborrheic keratosis: Secondary | ICD-10-CM

## 2022-12-09 DIAGNOSIS — L650 Telogen effluvium: Secondary | ICD-10-CM

## 2022-12-09 MED ORDER — MINOXIDIL 2.5 MG PO TABS: ORAL_TABLET | ORAL | 2 refills | Status: DC

## 2022-12-09 NOTE — Progress Notes (Signed)
Follow-Up Visit   Subjective  Sherri Murray is a 82 y.o. female who presents for the following: Annual Exam. Hx of seborrheic keratosis. Patient c/o hair thinning she is taking otc biotin vitamins with a poor response.  The patient presents for Total-Body Skin Exam (TBSE) for skin cancer screening and mole check.  The patient has spots, moles and lesions to be evaluated, some may be new or changing and the patient has concerns that these could be cancer.   The following portions of the chart were reviewed this encounter and updated as appropriate:   Tobacco  Allergies  Meds  Problems  Med Hx  Surg Hx  Fam Hx     Review of Systems:  No other skin or systemic complaints except as noted in HPI or Assessment and Plan.  Objective  Well appearing patient in no apparent distress; mood and affect are within normal limits.  A full examination was performed including scalp, head, eyes, ears, nose, lips, neck, chest, axillae, abdomen, back, buttocks, bilateral upper extremities, bilateral lower extremities, hands, feet, fingers, toes, fingernails, and toenails. All findings within normal limits unless otherwise noted below.  left neck x2, back x 15, left breast x 6 (23) Stuck-on, waxy, tan-brown papules -- Discussed benign etiology and prognosis.   Scalp Diffuse thinning of hair,    Assessment & Plan  Inflamed seborrheic keratosis (23) left neck x2, back x 15, left breast x 6 Symptomatic, irritating, patient would like treated.  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 This is because the doses used for hair loss are much lower than the doses which are used for conditions such as high blood pressure (hypertension). The doses used for hypertension are 10-'40mg'$  per day.  Side effects are uncommon at the low doses (up to 2.5 mg/day) used to treat hair loss. Potential side effects, more commonly seen at higher doses, include: Increase in hair growth (hypertrichosis) elsewhere on face and body Temporary hair shedding upon starting medication which may last up to 4 weeks Ankle swelling, fluid retention, rapid weight gain more than 5 pounds Low blood pressure and feeling lightheaded or dizzy when standing up quickly Fast or irregular heartbeat Headaches   BP 133/77 Pulse 58  Continue otc Biotin 2.5 mg once a day   Related Medications minoxidil (LONITEN) 2.5 MG tablet Take 1/2 tablet daily  Lentigines - Scattered tan macules - Due to sun exposure - Benign-appearing, observe - Recommend daily broad spectrum sunscreen SPF 30+ to sun-exposed areas, reapply every 2 hours as needed. - Call for any changes  Seborrheic Keratoses - Stuck-on, waxy, tan-brown papules and/or plaques  - Benign-appearing - Discussed benign etiology and prognosis. - Observe - Call for any changes  Melanocytic Nevi - Tan-brown and/or pink-flesh-colored symmetric macules and papules - Benign appearing on exam today - Observation - Call clinic for new or changing moles - Recommend  daily use of broad spectrum spf 30+ sunscreen to sun-exposed areas.   Hemangiomas - Red papules - Discussed benign nature - Observe - Call for any changes  Actinic Damage - Chronic condition, secondary to cumulative UV/sun exposure - diffuse scaly erythematous macules with underlying  dyspigmentation - Recommend daily broad spectrum sunscreen SPF 30+ to sun-exposed areas, reapply every 2 hours as needed.  - Staying in the shade or wearing long sleeves, sun glasses (UVA+UVB protection) and wide brim hats (4-inch brim around the entire circumference of the hat) are also recommended for sun protection.  - Call for new or changing lesions.  Skin cancer screening performed today.   Return in about 6 months (around 06/09/2023) for telogen effluvium and 12 months for a TBSE- ISKs .  IMarye Round, CMA, am acting as scribe for Sarina Ser, MD .  Documentation: I have reviewed the above documentation for accuracy and completeness, and I agree with the above.  Sarina Ser, MD

## 2022-12-09 NOTE — Patient Instructions (Addendum)
 Cryotherapy Aftercare  Wash gently with soap and water everyday.   Apply Vaseline and Band-Aid daily until healed. Due to recent changes in healthcare laws, you may see results of your pathology and/or laboratory studies on MyChart before the doctors have had a chance to review them. We understand that in some cases there may be results that are confusing or concerning to you. Please understand that not all results are received at the same time and often the doctors may need to interpret multiple results in order to provide you with the best plan of care or course of treatment. Therefore, we ask that you please give us 2 business days to thoroughly review all your results before contacting the office for clarification. Should we see a critical lab result, you will be contacted sooner.   If You Need Anything After Your Visit  If you have any questions or concerns for your doctor, please call our main line at [phone] and press option 4 to reach your doctor's medical assistant. If no one answers, please leave a voicemail as directed and we will return your call as soon as possible. Messages left after 4 pm will be answered the following business day.   You may also send us a message via MyChart. We typically respond to MyChart messages within 1-2 business days.  For prescription refills, please ask your pharmacy to contact our office. Our fax number is [phone].  If you have an urgent issue when the clinic is closed that cannot wait until the next business day, you can page your doctor at the number below.    Please note that while we do our best to be available for urgent issues outside of office hours, we are not available 24/7.   If you have an urgent issue and are unable to reach us, you may choose to seek medical care at your doctor's office, retail clinic, urgent care center, or emergency room.  If you have a medical emergency, please immediately call 911 or go to the emergency  department.  Pager Numbers  - Dr. Kowalski: [phone]  - Dr. Moye: [phone]  - Dr. Stewart: [phone]  In the event of inclement weather, please call our main line at [phone] for an update on the status of any delays or closures.  Dermatology Medication Tips: Please keep the boxes that topical medications come in in order to help keep track of the instructions about where and how to use these. Pharmacies typically print the medication instructions only on the boxes and not directly on the medication tubes.   If your medication is too expensive, please contact our office at [phone] option 4 or send us a message through MyChart.   We are unable to tell what your co-pay for medications will be in advance as this is different depending on your insurance coverage. However, we may be able to find a substitute medication at lower cost or fill out paperwork to get insurance to cover a needed medication.   If a prior authorization is required to get your medication covered by your insurance company, please allow us 1-2 business days to complete this process.  Drug prices often vary depending on where the prescription is filled and some pharmacies may offer cheaper prices.  The website www.goodrx.com contains coupons for medications through different pharmacies. The prices here do not account for what the cost may be with help from insurance (it may be cheaper with your insurance), but the website can give you the   price if you did not use any insurance.  - You can print the associated coupon and take it with your prescription to the pharmacy.  - You may also stop by our office during regular business hours and pick up a GoodRx coupon card.  - If you need your prescription sent electronically to a different pharmacy, notify our office through [facility] MyChart or by phone at [phone] option 4.     Si Usted Necesita Algo Despus de Su Visita  Tambin puede enviarnos un  mensaje a travs de MyChart. Por lo general respondemos a los mensajes de MyChart en el transcurso de 1 a 2 das hbiles.  Para renovar recetas, por favor pida a su farmacia que se ponga en contacto con nuestra oficina. Nuestro nmero de fax es el [phone].  Si tiene un asunto urgente cuando la clnica est cerrada y que no puede esperar hasta el siguiente da hbil, puede llamar/localizar a su doctor(a) al nmero que aparece a continuacin.   Por favor, tenga en cuenta que aunque hacemos todo lo posible para estar disponibles para asuntos urgentes fuera del horario de oficina, no estamos disponibles las 24 horas del da, los 7 das de la semana.   Si tiene un problema urgente y no puede comunicarse con nosotros, puede optar por buscar atencin mdica  en el consultorio de su doctor(a), en una clnica privada, en un centro de atencin urgente o en una sala de emergencias.  Si tiene una emergencia mdica, por favor llame inmediatamente al 911 o vaya a la sala de emergencias.  Nmeros de bper  - Dr. Kowalski: [phone]  - Dra. Moye: [phone]  - Dra. Stewart: [phone]  En caso de inclemencias del tiempo, por favor llame a nuestra lnea principal al [phone] para una actualizacin sobre el estado de cualquier retraso o cierre.  Consejos para la medicacin en dermatologa: Por favor, guarde las cajas en las que vienen los medicamentos de uso tpico para ayudarle a seguir las instrucciones sobre dnde y cmo usarlos. Las farmacias generalmente imprimen las instrucciones del medicamento slo en las cajas y no directamente en los tubos del medicamento.   Si su medicamento es muy caro, por favor, pngase en contacto con nuestra oficina llamando al [phone] y presione la opcin 4 o envenos un mensaje a travs de MyChart.   No podemos decirle cul ser su copago por los medicamentos por adelantado ya que esto es diferente dependiendo de la cobertura de su seguro. Sin embargo,  es posible que podamos encontrar un medicamento sustituto a menor costo o llenar un formulario para que el seguro cubra el medicamento que se considera necesario.   Si se requiere una autorizacin previa para que su compaa de seguros cubra su medicamento, por favor permtanos de 1 a 2 das hbiles para completar este proceso.  Los precios de los medicamentos varan con frecuencia dependiendo del lugar de dnde se surte la receta y alguna farmacias pueden ofrecer precios ms baratos.  El sitio web www.goodrx.com tiene cupones para medicamentos de diferentes farmacias. Los precios aqu no tienen en cuenta lo que podra costar con la ayuda del seguro (puede ser ms barato con su seguro), pero el sitio web puede darle el precio si no utiliz ningn seguro.  - Puede imprimir el cupn correspondiente y llevarlo con su receta a la farmacia.  - Tambin puede pasar por nuestra oficina durante el horario de atencin regular y recoger una tarjeta de cupones de GoodRx.  - Si necesita que   su receta se enve electrnicamente a una farmacia diferente, informe a nuestra oficina a travs de MyChart de [facility] o por telfono llamando al [phone] y presione la opcin 4.  

## 2022-12-11 ENCOUNTER — Other Ambulatory Visit: Payer: Self-pay | Admitting: Physician Assistant

## 2022-12-11 DIAGNOSIS — M810 Age-related osteoporosis without current pathological fracture: Secondary | ICD-10-CM

## 2022-12-16 ENCOUNTER — Encounter: Payer: Self-pay | Admitting: Dermatology

## 2022-12-31 ENCOUNTER — Other Ambulatory Visit: Payer: 59

## 2023-01-05 ENCOUNTER — Other Ambulatory Visit: Payer: Self-pay | Admitting: Family Medicine

## 2023-01-06 ENCOUNTER — Other Ambulatory Visit: Payer: Self-pay | Admitting: Family Medicine

## 2023-01-14 DIAGNOSIS — H43813 Vitreous degeneration, bilateral: Secondary | ICD-10-CM | POA: Diagnosis not present

## 2023-01-14 DIAGNOSIS — Z961 Presence of intraocular lens: Secondary | ICD-10-CM | POA: Diagnosis not present

## 2023-01-14 DIAGNOSIS — H532 Diplopia: Secondary | ICD-10-CM | POA: Diagnosis not present

## 2023-01-14 DIAGNOSIS — H5000 Unspecified esotropia: Secondary | ICD-10-CM | POA: Diagnosis not present

## 2023-01-18 ENCOUNTER — Other Ambulatory Visit: Payer: Self-pay | Admitting: Allergy & Immunology

## 2023-01-18 ENCOUNTER — Other Ambulatory Visit: Payer: Self-pay | Admitting: Family Medicine

## 2023-02-25 ENCOUNTER — Ambulatory Visit: Payer: 59 | Admitting: Obstetrics and Gynecology

## 2023-03-10 ENCOUNTER — Encounter: Payer: Self-pay | Admitting: Obstetrics and Gynecology

## 2023-03-10 ENCOUNTER — Ambulatory Visit (INDEPENDENT_AMBULATORY_CARE_PROVIDER_SITE_OTHER): Payer: 59 | Admitting: Obstetrics and Gynecology

## 2023-03-10 VITALS — BP 119/57 | HR 58

## 2023-03-10 DIAGNOSIS — N813 Complete uterovaginal prolapse: Secondary | ICD-10-CM

## 2023-03-10 DIAGNOSIS — R159 Full incontinence of feces: Secondary | ICD-10-CM | POA: Diagnosis not present

## 2023-03-10 DIAGNOSIS — Z4689 Encounter for fitting and adjustment of other specified devices: Secondary | ICD-10-CM

## 2023-03-10 DIAGNOSIS — N952 Postmenopausal atrophic vaginitis: Secondary | ICD-10-CM | POA: Diagnosis not present

## 2023-03-10 MED ORDER — ESTRADIOL 0.1 MG/GM VA CREA: 0.5000 g | TOPICAL_CREAM | VAGINAL | 11 refills | Status: DC

## 2023-03-10 NOTE — Progress Notes (Signed)
[facility]   Subjective:     Chief Complaint:  Chief Complaint  Patient presents with   Pessary Check   History of Present Illness: Sherri Murray is a 82 y.o. female with stage III pelvic organ prolapse who presents for a pessary check. She is using a size #3 ring with support pessary. The pessary has been working well and she has no complaints. She is using vaginal estrogen. She denies vaginal bleeding.  She also wants to have her Interstim stimulation changed today. She reports she is frequently leaking and having some bowel incontinence as well.   Past Medical History: Patient  has a past medical history of Acute ischemic colitis (HCC) (02/26/2012), Acute posthemorrhagic anemia, Arthritis, Asthma, Chronic diarrhea, Chronic foot pain, Complication of anesthesia, Difficult intubation, Fecal incontinence, Fibromyalgia, GERD (gastroesophageal reflux disease), Headache, Heart disease (12/20/2014), Heart murmur, Hyperlipidemia, Hypertriglyceridemia (02/2012), IBS (irritable bowel syndrome), Incontinence of urine, Internal hemorrhoids, Ischemic colitis (HCC) (02/2012), Sleep apnea, and Thyroid goiter.   Past Surgical History: She  has a past surgical history that includes Thyroid surgery; Tonsillectomy; Tubal ligation; Cataract extraction (Bilateral); Myomectomy; Colonoscopy (02/27/2012); liver biopsy (1980); Knee Arthroplasty (Right, 12/11/2015); Sacral nerve stimulator placement; Eye surgery; Total shoulder arthroplasty (Right, 02/05/2017); and Tooth extraction.   Medications: She has a current medication list which includes the following prescription(s): acetaminophen, albuterol, alendronate, atorvastatin, biotin, biotin w/ vitamins c & e, calcium carbonate-vitamin d, vitamin d, dicyclomine, diphenhydramine-acetaminophen, epinephrine, estradiol, flaxseed (linseed), folic acid, glucosamine-chondroitin, loperamide, loratadine, losartan, melatonin, meloxicam, minoxidil,  montelukast, centrum silver, multiple vitamins-minerals, nystatin cream, oxybutynin, and pantoprazole.   Allergies: Patient is allergic to apple juice, banana, barium-containing compounds, bee venom, celery oil, iodinated contrast media, ioxaglate, metrizamide, other, penicillins, strawberry extract, aspirin, barium sulfate, latex, licorice [glycyrrhiza], sulfa antibiotics, and etodolac.   Social History: Patient  reports that she has never smoked. She has never used smokeless tobacco. She reports current alcohol use of about 1.0 standard drink of alcohol per week. She reports that she does not use drugs.      Objective:    Physical Exam: BP (!) 119/57   Pulse (!) 58  Gen: No apparent distress, A&O x 3. Detailed Urogynecologic Evaluation:  Pelvic Exam: Normal external female genitalia; Bartholin's and Skene's glands normal in appearance; urethral meatus with caruncle, no urethral masses or discharge. The pessary was noted to be in place. It was removed and cleaned. Speculum exam revealed no lesions in the vagina. The pessary was replaced. It was comfortable to the patient and fit well.     Assessment/Plan:    Assessment: Sherri Murray is a 82 y.o. with stage III pelvic organ prolapse and OAB here for a pessary check. She is doing well.  Plan: She will keep the pessary in place until next visit. She will continue to use estrogen. She will follow-up in 4 months for a pessary check or sooner as needed.   Stimulation changed to Program 1 at 1.3 stimulation level.   Patient also requested more samples of Gemtesa to tide her until she can see [facility] who has been historically managing her OAB and Interstim.   All questions were answered.

## 2023-03-10 NOTE — Patient Instructions (Addendum)
capsaicin patches: can be great for the back but gets HOT. Please be careful if you choose to use this.   You can also use voltaren on your back  Stimulation changed to Program 1 at 1.3 stimulation level.   Samples of Gemtesa given until you can follow up with Dr. Perley Jain

## 2023-03-12 ENCOUNTER — Ambulatory Visit: Payer: 59 | Admitting: Podiatry

## 2023-03-19 ENCOUNTER — Encounter: Payer: Self-pay | Admitting: Podiatry

## 2023-03-19 ENCOUNTER — Ambulatory Visit (INDEPENDENT_AMBULATORY_CARE_PROVIDER_SITE_OTHER): Payer: 59 | Admitting: Podiatry

## 2023-03-19 VITALS — BP 115/70

## 2023-03-19 DIAGNOSIS — B351 Tinea unguium: Secondary | ICD-10-CM | POA: Diagnosis not present

## 2023-03-19 DIAGNOSIS — M79676 Pain in unspecified toe(s): Secondary | ICD-10-CM | POA: Diagnosis not present

## 2023-03-19 NOTE — Progress Notes (Signed)
Subjective:  Patient ID: Sherri Murray, female    DOB: 1941-01-08,  MRN: [id number]  Sherri Murray presents to clinic today for  Chief Complaint  Patient presents with   Nail Problem    RFC,Referring Provider Ermalene Searing, Amy E, MD,LOV:02/24      New problem(s): None.   PCP is Bedsole, Amy E, MD.  Allergies  Allergen Reactions   Apple Juice Swelling    Gums Gums Gums   Banana Swelling    Gums, tongue Gums, tongue Gums, tongue   Barium-Containing Compounds Swelling    Throat swells   Bee Venom Hives   Celery Oil Swelling    Gums Gums   Iodinated Contrast Media Swelling and Anaphylaxis    Other reaction(s): SWELLING THROAT SWELLING Other reaction(s): SWELLING THROAT SWELLING   Ioxaglate Swelling    THROAT SWELLING THROAT SWELLING   Metrizamide Anaphylaxis and Swelling    Other reaction(s): SWELLING THROAT SWELLING Other reaction(s): SWELLING THROAT SWELLING Other reaction(s): SWELLING THROAT SWELLING Other reaction(s): SWELLING THROAT SWELLING   Other Swelling, Hives and Nausea And Vomiting    Peanut butter/celery Throat swells   Penicillins Anaphylaxis and Swelling    Has patient had a PCN reaction causing immediate rash, facial/tongue/throat swelling, SOB or lightheadedness with hypotension: Yes Has patient had a PCN reaction causing severe rash involving mucus membranes or skin necrosis: No Has patient had a PCN reaction that required hospitalization No Has patient had a PCN reaction occurring within the last 10 years: No If all of the above answers are "NO", then may proceed with Cephalosporin use.    Strawberry Extract Swelling    Gums, tongue, lips Gums, tongue, lips Gums, tongue, lips   Aspirin Other (See Comments)    Other reaction(s): OTHER Claims to see silver things with regular ASA but patient says she tolerates baby Aspirin okay.    Barium Sulfate     Throat swells   Latex     Other reaction(s): UNKNOWN   Licorice  [Glycyrrhiza]    Sulfa Antibiotics     Other reaction(s): SWELLING   Etodolac Nausea And Vomiting    Review of Systems: Negative except as noted in the HPI. Objective:   Vitals:   03/19/23 0929  BP: 115/70   Constitutional Leyli Bowdoin is a pleasant 82 y.o. female, WD, WN in NAD. AAO x 3.   Vascular Vascular Examination: Capillary refill time immediate b/l. Vascular status intact b/l with palpable pedal pulses. Pedal hair absent. No edema. No pain with calf compression b/l. Skin temperature gradient WNL b/l.   Neurological Examination: Sensation grossly intact b/l with 10 gram monofilament. Vibratory sensation intact b/l.   Dermatological Examination: Pedal skin with normal turgor, texture and tone b/l.  No open wounds. No interdigital macerations.   Toenails 1-5 b/l thick, discolored, elongated with subungual debris and pain on dorsal palpation.   No hyperkeratotic nor porokeratotic lesions present on today's visit.  Musculoskeletal Examination: Normal muscle strength 5/5 to all lower extremity muscle groups bilaterally. No pain, crepitus or joint limitation noted with ROM b/l LE. No gross bony pedal deformities b/l. Patient ambulates independently without assistive aids.  Radiographs: None  Last A1c:      Latest Ref Rng & Units 11/20/2022    2:48 PM  Hemoglobin A1C  Hemoglobin-A1c <5.7 % of total Hgb 5.6      Assessment:   1. Pain due to onychomycosis of toenail    Plan:  Consent given for treatment as described below: Patient was  evaluated and treated. All patient's and/or POA's questions/concerns addressed on today's visit. Toenails 1-5 debrided in length and girth without incident. Continue soft, supportive shoe gear daily. Report any pedal injuries to medical professional. Call office if there are any questions/concerns. -Patient/POA to call should there be question/concern in the interim.  Return in about 3 months (around 06/19/2023).  Freddie Breech, DPM

## 2023-03-27 ENCOUNTER — Other Ambulatory Visit: Payer: Self-pay | Admitting: Allergy & Immunology

## 2023-03-27 ENCOUNTER — Other Ambulatory Visit: Payer: Self-pay | Admitting: Family Medicine

## 2023-03-27 DIAGNOSIS — B379 Candidiasis, unspecified: Secondary | ICD-10-CM

## 2023-03-29 NOTE — Telephone Encounter (Signed)
Last office visit 11/26/2022 for CPE.  Last refilled 11/21/2021 for 60 g with 3 refills.  Next appt: CPE 11/30/2023.

## 2023-04-12 DIAGNOSIS — R350 Frequency of micturition: Secondary | ICD-10-CM | POA: Diagnosis not present

## 2023-04-23 DIAGNOSIS — R350 Frequency of micturition: Secondary | ICD-10-CM | POA: Diagnosis not present

## 2023-05-14 DIAGNOSIS — R1032 Left lower quadrant pain: Secondary | ICD-10-CM | POA: Diagnosis not present

## 2023-05-18 ENCOUNTER — Ambulatory Visit (INDEPENDENT_AMBULATORY_CARE_PROVIDER_SITE_OTHER): Payer: 59 | Admitting: Nurse Practitioner

## 2023-05-18 VITALS — BP 110/80 | HR 63 | Temp 97.5°F | Ht 62.5 in | Wt 150.2 lb

## 2023-05-18 DIAGNOSIS — R52 Pain, unspecified: Secondary | ICD-10-CM | POA: Diagnosis not present

## 2023-05-18 DIAGNOSIS — G4489 Other headache syndrome: Secondary | ICD-10-CM

## 2023-05-18 DIAGNOSIS — R6883 Chills (without fever): Secondary | ICD-10-CM

## 2023-05-18 DIAGNOSIS — R1030 Lower abdominal pain, unspecified: Secondary | ICD-10-CM

## 2023-05-18 DIAGNOSIS — R519 Headache, unspecified: Secondary | ICD-10-CM | POA: Insufficient documentation

## 2023-05-18 LAB — POCT URINALYSIS DIPSTICK
Bilirubin, UA: NEGATIVE
Blood, UA: POSITIVE
Glucose, UA: NEGATIVE
Ketones, UA: POSITIVE
Leukocytes, UA: NEGATIVE
Nitrite, UA: NEGATIVE
Protein, UA: POSITIVE — AB
Spec Grav, UA: 1.025 (ref 1.010–1.025)
Urobilinogen, UA: 0.2 E.U./dL
pH, UA: 6 (ref 5.0–8.0)

## 2023-05-18 LAB — CBC
HCT: 39.3 % (ref 36.0–46.0)
Hemoglobin: 13 g/dL (ref 12.0–15.0)
MCHC: 33 g/dL (ref 30.0–36.0)
MCV: 96.1 fl (ref 78.0–100.0)
Platelets: 218 10*3/uL (ref 150.0–400.0)
RBC: 4.09 Mil/uL (ref 3.87–5.11)
RDW: 13 % (ref 11.5–15.5)
WBC: 13.5 10*3/uL — ABNORMAL HIGH (ref 4.0–10.5)

## 2023-05-18 LAB — COMPREHENSIVE METABOLIC PANEL
ALT: 14 U/L (ref 0–35)
AST: 21 U/L (ref 0–37)
Albumin: 4.2 g/dL (ref 3.5–5.2)
Alkaline Phosphatase: 65 U/L (ref 39–117)
BUN: 17 mg/dL (ref 6–23)
CO2: 24 mEq/L (ref 19–32)
Calcium: 9.5 mg/dL (ref 8.4–10.5)
Chloride: 100 mEq/L (ref 96–112)
Creatinine, Ser: 0.98 mg/dL (ref 0.40–1.20)
GFR: 53.54 mL/min — ABNORMAL LOW (ref >60.00)
Glucose, Bld: 126 mg/dL — ABNORMAL HIGH (ref 70–99)
Potassium: 4 mEq/L (ref 3.5–5.1)
Sodium: 137 mEq/L (ref 135–145)
Total Bilirubin: 0.5 mg/dL (ref 0.2–1.2)
Total Protein: 7.5 g/dL (ref 6.0–8.3)

## 2023-05-18 LAB — CK: Total CK: 69 U/L (ref 7–177)

## 2023-05-18 LAB — POC COVID19 BINAXNOW: SARS Coronavirus 2 Ag: NEGATIVE

## 2023-05-18 NOTE — Assessment & Plan Note (Signed)
Ambiguous nature.  He was recently treated for UTI but the culture not come back yet.  Will discontinue the nitrofurantoin.UA in office today pending urine culture also check basic labs

## 2023-05-18 NOTE — Assessment & Plan Note (Signed)
COVID test in office.

## 2023-05-18 NOTE — Assessment & Plan Note (Signed)
COVID test in office.  Over-the-counter analgesics as needed

## 2023-05-18 NOTE — Patient Instructions (Signed)
Nice to see you today I would stop the antibiotic I will be in touch with the labs once I have reviewed them Follow up if you do not start to improve or if your symptoms worsen

## 2023-05-18 NOTE — Progress Notes (Signed)
Acute Office Visit  Subjective:     Patient ID: Sherri Murray, female    DOB: 1941-06-20, 82 y.o.   MRN: [id number]  Chief Complaint  Patient presents with   Medication Reaction    Pt states she has chills, shaking, no appetite. Pt states she hasn't eaten since Saturday. Last temp check was 99.2.  started the meds on Friday.     HPI Patient is in today for medication concern with a history of mitral regurgitation, HTN, asthma, GERD, Prediabetes.  Patient was seen and stared on macrobid on firday. States that she has an implant to help pee and poop. States that the urine was yellow and was states that the urine was not infected but sent off for a culture, she has not heard back yet.  States that she was at a shower on Saturday and starting Sunday she had chills and subjective fever. States that she has had body aches  States she did not test for covid Stopped taking the medication on Monday maybe Sunday  Covid vaccines UTD  Review of Systems  Constitutional:  Positive for chills and malaise/fatigue. Negative for fever.  HENT:  Positive for ear pain. Negative for sore throat.   Respiratory:  Negative for cough and shortness of breath.   Gastrointestinal:  Positive for abdominal pain. Negative for nausea and vomiting.       Fecal incontience   Musculoskeletal:  Positive for myalgias.  Neurological:  Positive for headaches.        Objective:    BP 110/80   Pulse 63   Temp (!) 97.5 F (36.4 C) (Temporal)   Ht 5' 2.5" (1.588 m)   Wt 150 lb 3.2 oz (68.1 kg)   SpO2 93%   BMI 27.03 kg/m  BP Readings from Last 3 Encounters:  05/18/23 110/80  03/19/23 115/70  03/10/23 (!) 119/57   Wt Readings from Last 3 Encounters:  05/18/23 150 lb 3.2 oz (68.1 kg)  11/26/22 153 lb 8 oz (69.6 kg)  11/19/22 152 lb (68.9 kg)   SpO2 Readings from Last 3 Encounters:  05/18/23 93%  11/26/22 97%  01/29/22 97%      Physical Exam Vitals and nursing note reviewed.   Constitutional:      Appearance: Normal appearance.  HENT:     Right Ear: Tympanic membrane, ear canal and external ear normal.     Left Ear: Tympanic membrane, ear canal and external ear normal.     Mouth/Throat:     Mouth: Mucous membranes are dry.     Pharynx: Oropharynx is clear.  Cardiovascular:     Rate and Rhythm: Normal rate and regular rhythm.     Heart sounds: Normal heart sounds.  Pulmonary:     Effort: Pulmonary effort is normal.     Breath sounds: Normal breath sounds.  Abdominal:     General: Bowel sounds are normal. There is no distension.     Palpations: There is no mass.     Tenderness: There is abdominal tenderness. There is no right CVA tenderness or left CVA tenderness.     Hernia: No hernia is present.    Lymphadenopathy:     Cervical: No cervical adenopathy.  Neurological:     Mental Status: She is alert.     Results for orders placed or performed in visit on 05/18/23  POCT urinalysis dipstick  Result Value Ref Range   Color, UA Dark Amber    Clarity, UA cloudy  Glucose, UA Negative Negative   Bilirubin, UA neg    Ketones, UA pos    Spec Grav, UA 1.025 1.010 - 1.025   Blood, UA pos    pH, UA 6.0 5.0 - 8.0   Protein, UA Positive (A) Negative   Urobilinogen, UA 0.2 0.2 or 1.0 E.U./dL   Nitrite, UA neg    Leukocytes, UA Negative Negative   Appearance     Odor    POC COVID-19  Result Value Ref Range   SARS Coronavirus 2 Ag Negative Negative        Assessment & Plan:   Problem List Items Addressed This Visit       Other   Lower abdominal pain - Primary    Ambiguous nature.  He was recently treated for UTI but the culture not come back yet.  Will discontinue the nitrofurantoin.UA in office today pending urine culture also check basic labs      Relevant Orders   Urine Culture   POCT urinalysis dipstick (Completed)   CBC   Comprehensive metabolic panel   CK   Chills    COVID test in office      Relevant Orders   POC COVID-19  (Completed)   Body aches    COVID test in office.  Over-the-counter analgesics as needed      Relevant Orders   POC COVID-19 (Completed)   CK   Other headache syndrome    COVID test in office was negative.  Can use over-the-counter analgesia as needed work on hydration      Relevant Orders   POC COVID-19 (Completed)    No orders of the defined types were placed in this encounter.   Return if symptoms worsen or fail to improve.  Audria Nine, NP

## 2023-05-18 NOTE — Assessment & Plan Note (Signed)
COVID test in office was negative.  Can use over-the-counter analgesia as needed work on hydration

## 2023-05-20 ENCOUNTER — Telehealth: Payer: Self-pay | Admitting: Nurse Practitioner

## 2023-05-20 NOTE — Telephone Encounter (Signed)
-----   Message from [facility] T sent at 05/20/2023 12:32 PM EDT ----- Called patient reviewed all information and repeated back to me. Pt states that her symptoms have not gotten better. States that she is in bed all day with chills, no appetite, but no fever. Pt complains of incontinence and feeling like she has the flu. Pt is worried about pancreatic cancer. Pt has been drinking water with energy/vitamin packets.

## 2023-05-20 NOTE — Telephone Encounter (Signed)
I agree with it being viral can we have her get an appointment with Dr. Ermalene Searing next week please

## 2023-05-21 DIAGNOSIS — R8279 Other abnormal findings on microbiological examination of urine: Secondary | ICD-10-CM | POA: Diagnosis not present

## 2023-05-21 DIAGNOSIS — N130 Hydronephrosis with ureteropelvic junction obstruction: Secondary | ICD-10-CM | POA: Diagnosis not present

## 2023-05-21 DIAGNOSIS — R1032 Left lower quadrant pain: Secondary | ICD-10-CM | POA: Diagnosis not present

## 2023-05-21 NOTE — Telephone Encounter (Signed)
Left Detailed voicemail for patient to call the office back.  Contacted pt in an attempt to schedule appointment with Dr.Bedsole next week.

## 2023-05-26 ENCOUNTER — Other Ambulatory Visit: Payer: Self-pay | Admitting: Family Medicine

## 2023-05-26 ENCOUNTER — Telehealth: Payer: Self-pay | Admitting: Family Medicine

## 2023-05-26 DIAGNOSIS — M810 Age-related osteoporosis without current pathological fracture: Secondary | ICD-10-CM

## 2023-05-26 NOTE — Telephone Encounter (Signed)
[facility] called in stated fax was sent in for Bone Density orders for pt . Stated pt appointment is for tomorrow 05/27/23 # [phone]

## 2023-05-26 NOTE — Telephone Encounter (Signed)
Order in Dr. Daphine Deutscher office in box for signature.

## 2023-05-27 ENCOUNTER — Inpatient Hospital Stay: Admission: RE | Admit: 2023-05-27 | Payer: 59 | Source: Ambulatory Visit

## 2023-06-01 ENCOUNTER — Encounter: Payer: Self-pay | Admitting: Family Medicine

## 2023-06-01 ENCOUNTER — Ambulatory Visit (INDEPENDENT_AMBULATORY_CARE_PROVIDER_SITE_OTHER): Payer: 59 | Admitting: Family Medicine

## 2023-06-01 VITALS — BP 140/70 | HR 58 | Temp 97.9°F | Ht 62.5 in | Wt 152.1 lb

## 2023-06-01 DIAGNOSIS — R1030 Lower abdominal pain, unspecified: Secondary | ICD-10-CM

## 2023-06-01 DIAGNOSIS — Z1211 Encounter for screening for malignant neoplasm of colon: Secondary | ICD-10-CM

## 2023-06-01 NOTE — Progress Notes (Signed)
Patient ID: Sherri Murray, female    DOB: 1941-07-18, 82 y.o.   MRN: [id number]  This visit was conducted in person.  BP (!) 140/70 (BP Location: Right Arm, Patient Position: Sitting, Cuff Size: Normal)   Pulse (!) 58   Temp 97.9 F (36.6 C) (Temporal)   Ht 5' 2.5" (1.588 m)   Wt 152 lb 2 oz (69 kg)   SpO2 98%   BMI 27.38 kg/m    CC:  Chief Complaint  Patient presents with   Medical Management of Chronic Issues    Follow up from 05/18/23 appointment with Cable    Subjective:   HPI: [name] is a 82 y.o. female presenting on 06/01/2023 for Medical Management of Chronic Issues (Follow up from 05/18/23 appointment with Toney Reil) Reviewed recent office visit from May 18, 2023 with Audria Nine, NP.  She presented with lower abdominal pain and chills and fatigue. Prior to this she had been started on Macrobid for urinary tract infection, culture was pending. Matt recommended stopping Macrobid and evaluated her with urinalysis, urine culture CBC, complete metabolic panel and CK. Urine culture returned negative and other labs were normal except for slightly elevated white blood cells.  Felt to most likely be viral.  She feels significantly better today. No further chills, no  baody ache above her baseline fibromyalgia.   BMs still softer.  She has has had follow up with Urology... was treated any way with a different antibitoic.     She would like to do Cologuard given mothers history of  colon cancer.  2013  Colonoscopy  [facility].  Sees Urogynecologist for Pessary and stimulator.      Relevant past medical, surgical, family and social history reviewed and updated as indicated. Interim medical history since our last visit reviewed. Allergies and medications reviewed and updated. Outpatient Medications Prior to Visit  Medication Sig Dispense Refill   acetaminophen (TYLENOL) 325 MG tablet Take 650 mg by mouth every 6 (six) hours as needed.     albuterol  (VENTOLIN HFA) 108 (90 Base) MCG/ACT inhaler Inhale 2 puffs into the lungs every 6 (six) hours as needed for wheezing or shortness of breath. 8 g 2   alendronate (FOSAMAX) 70 MG tablet TAKE 1 TABLET (70 MG TOTAL) BY MOUTH ONCE A WEEK. TAKE WITH A FULL GLASS OF WATER ON AN EMPTY STOMACH. 4 tablet 0   atorvastatin (LIPITOR) 40 MG tablet TAKE 1 TABLET BY MOUTH EVERYDAY AT BEDTIME 90 tablet 3   Biotin 5000 MCG TABS Take 5,000 mcg by mouth daily.     Biotin w/ Vitamins C & E (HAIR/SKIN/NAILS PO) Take 1 tablet by mouth in the morning, at noon, and at bedtime.     Calcium Carbonate-Vitamin D 600-400 MG-UNIT chew tablet Chew 1 tablet by mouth daily.      Cholecalciferol (VITAMIN D PO) Take by mouth daily.     dicyclomine (BENTYL) 20 MG tablet TAKE 1 TABLET (20 MG TOTAL) BY MOUTH 4 (FOUR) TIMES DAILY - BEFORE MEALS AND AT BEDTIME. 360 tablet 3   diphenhydramine-acetaminophen (TYLENOL PM) 25-500 MG TABS tablet Take 1 tablet by mouth at bedtime as needed.     EPINEPHrine 0.3 mg/0.3 mL IJ SOAJ injection Inject 0.3 mg into the muscle as needed. 1 each 1   estradiol (ESTRACE) 0.1 MG/GM vaginal cream Place 0.5 g vaginally 2 (two) times a week. Place 0.5g nightly for two weeks then twice a week after 30 g 11   Flaxseed,  Linseed, (FLAXSEED OIL PO) Take 1 capsule by mouth in the morning and at bedtime.     FOLIC ACID PO Take 1 tablet by mouth daily.      GLUCOSAMINE-CHONDROITIN PO Take 1 tablet by mouth 2 (two) times daily.     loperamide (IMODIUM) 2 MG capsule Take 2 mg by mouth as needed for diarrhea or loose stools.     losartan (COZAAR) 25 MG tablet TAKE 2 TABLETS BY MOUTH EVERY DAY 180 tablet 3   MAGNESIUM PO Take 1 tablet by mouth daily.     MELATONIN PO Take by mouth at bedtime.     meloxicam (MOBIC) 15 MG tablet TAKE 1 TABLET BY MOUTH EVERY DAY 90 tablet 0   minoxidil (LONITEN) 2.5 MG tablet Take 1/2 tablet daily 30 tablet 2   montelukast (SINGULAIR) 10 MG tablet Take 1 tablet (10 mg total) by mouth at  bedtime. 30 tablet 11   Multiple Vitamins-Minerals (CENTRUM SILVER) tablet Take 1 tablet by mouth daily.     Multiple Vitamins-Minerals (ZINC PO) Take 1 tablet by mouth daily.     nystatin cream (MYCOSTATIN) APPLY TO AFFECTED AREA TWICE A DAY AS NEEDED 60 g 3   oxybutynin (DITROPAN-XL) 10 MG 24 hr tablet Take 10 mg by mouth daily.     pantoprazole (PROTONIX) 40 MG tablet Take 1 tablet (40 mg total) by mouth 2 (two) times daily. 180 tablet 3   loratadine (CLARITIN) 10 MG tablet Take 1 tablet (10 mg total) by mouth daily for 30 doses. 30 tablet 11   No facility-administered medications prior to visit.     Per HPI unless specifically indicated in ROS section below Review of Systems  Constitutional:  Negative for fatigue and fever.  HENT:  Negative for congestion.   Eyes:  Negative for pain.  Respiratory:  Negative for cough and shortness of breath.   Cardiovascular:  Negative for chest pain, palpitations and leg swelling.  Gastrointestinal:  Negative for abdominal pain.  Genitourinary:  Negative for dysuria and vaginal bleeding.  Musculoskeletal:  Negative for back pain.  Neurological:  Negative for syncope, light-headedness and headaches.  Psychiatric/Behavioral:  Negative for dysphoric mood.    Objective:  BP (!) 140/70 (BP Location: Right Arm, Patient Position: Sitting, Cuff Size: Normal)   Pulse (!) 58   Temp 97.9 F (36.6 C) (Temporal)   Ht 5' 2.5" (1.588 m)   Wt 152 lb 2 oz (69 kg)   SpO2 98%   BMI 27.38 kg/m   Wt Readings from Last 3 Encounters:  06/01/23 152 lb 2 oz (69 kg)  05/18/23 150 lb 3.2 oz (68.1 kg)  11/26/22 153 lb 8 oz (69.6 kg)      Physical Exam Constitutional:      General: She is not in acute distress.    Appearance: Normal appearance. She is well-developed. She is not ill-appearing or toxic-appearing.  HENT:     Head: Normocephalic.     Right Ear: Hearing, tympanic membrane, ear canal and external ear normal. Tympanic membrane is not erythematous,  retracted or bulging.     Left Ear: Hearing, tympanic membrane, ear canal and external ear normal. Tympanic membrane is not erythematous, retracted or bulging.     Nose: No mucosal edema or rhinorrhea.     Right Sinus: No maxillary sinus tenderness or frontal sinus tenderness.     Left Sinus: No maxillary sinus tenderness or frontal sinus tenderness.     Mouth/Throat:     Pharynx: Uvula midline.  Eyes:     General: Lids are normal. Lids are everted, no foreign bodies appreciated.     Conjunctiva/sclera: Conjunctivae normal.     Pupils: Pupils are equal, round, and reactive to light.  Neck:     Thyroid: No thyroid mass or thyromegaly.     Vascular: No carotid bruit.     Trachea: Trachea normal.  Cardiovascular:     Rate and Rhythm: Normal rate and regular rhythm.     Pulses: Normal pulses.     Heart sounds: Normal heart sounds, S1 normal and S2 normal. No murmur heard.    No friction rub. No gallop.  Pulmonary:     Effort: Pulmonary effort is normal. No tachypnea or respiratory distress.     Breath sounds: Normal breath sounds. No decreased breath sounds, wheezing, rhonchi or rales.  Abdominal:     General: Bowel sounds are normal.     Palpations: Abdomen is soft.     Tenderness: There is no abdominal tenderness.  Musculoskeletal:     Cervical back: Normal range of motion and neck supple.  Skin:    General: Skin is warm and dry.     Findings: No rash.  Neurological:     Mental Status: She is alert and oriented to person, place, and time.     GCS: GCS eye subscore is 4. GCS verbal subscore is 5. GCS motor subscore is 6.     Cranial Nerves: No cranial nerve deficit.     Sensory: No sensory deficit.     Motor: No abnormal muscle tone.     Coordination: Coordination normal.     Gait: Gait normal.     Deep Tendon Reflexes: Reflexes are normal and symmetric.     Comments: Nml cerebellar exam   No papilledema  Psychiatric:        Mood and Affect: Mood is not anxious or depressed.         Speech: Speech normal.        Behavior: Behavior normal. Behavior is cooperative.        Thought Content: Thought content normal.        Cognition and Memory: Memory is not impaired. She does not exhibit impaired recent memory or impaired remote memory.        Judgment: Judgment normal.       Results for orders placed or performed in visit on 05/18/23  Urine Culture   Specimen: Urine  Result Value Ref Range   MICRO NUMBER: [id number]    SPECIMEN QUALITY: Adequate    Sample Source URINE    STATUS: FINAL    Result:      Mixed genital flora isolated. These superficial bacteria are not indicative of a urinary tract infection. No further organism identification is warranted on this specimen. If clinically indicated, recollect clean-catch, mid-stream urine and transfer  immediately to Urine Culture Transport Tube.   CBC  Result Value Ref Range   WBC 13.5 (H) 4.0 - 10.5 K/uL   RBC 4.09 3.87 - 5.11 Mil/uL   Platelets 218.0 150.0 - 400.0 K/uL   Hemoglobin 13.0 12.0 - 15.0 g/dL   HCT 95.2 84.1 - 32.4 %   MCV 96.1 78.0 - 100.0 fl   MCHC 33.0 30.0 - 36.0 g/dL   RDW 40.1 02.7 - 25.3 %  Comprehensive metabolic panel  Result Value Ref Range   Sodium 137 135 - 145 mEq/L   Potassium 4.0 3.5 - 5.1 mEq/L   Chloride 100 96 - 112  mEq/L   CO2 24 19 - 32 mEq/L   Glucose, Bld 126 (H) 70 - 99 mg/dL   BUN 17 6 - 23 mg/dL   Creatinine, Ser 2.13 0.40 - 1.20 mg/dL   Total Bilirubin 0.5 0.2 - 1.2 mg/dL   Alkaline Phosphatase 65 39 - 117 U/L   AST 21 0 - 37 U/L   ALT 14 0 - 35 U/L   Total Protein 7.5 6.0 - 8.3 g/dL   Albumin 4.2 3.5 - 5.2 g/dL   GFR 08.65 (L) >78.46 mL/min   Calcium 9.5 8.4 - 10.5 mg/dL  CK  Result Value Ref Range   Total CK 69 7 - 177 U/L  POCT urinalysis dipstick  Result Value Ref Range   Color, UA Dark Amber    Clarity, UA cloudy    Glucose, UA Negative Negative   Bilirubin, UA neg    Ketones, UA pos    Spec Grav, UA 1.025 1.010 - 1.025   Blood, UA pos    pH, UA  6.0 5.0 - 8.0   Protein, UA Positive (A) Negative   Urobilinogen, UA 0.2 0.2 or 1.0 E.U./dL   Nitrite, UA neg    Leukocytes, UA Negative Negative   Appearance     Odor    POC COVID-19  Result Value Ref Range   SARS Coronavirus 2 Ag Negative Negative    This visit occurred during the SARS-CoV-2 public health emergency.  Safety protocols were in place, including screening questions prior to the visit, additional usage of staff PPE, and extensive cleaning of exam room while observing appropriate contact time as indicated for disinfecting solutions.   COVID 19 screen:  No recent travel or known exposure to COVID19 The patient denies respiratory symptoms of COVID 19 at this time. The importance of social distancing was discussed today.   Assessment and Plan  Problem List Items Addressed This Visit     Lower abdominal pain - Primary    Acute, now resolved.  Possibly secondary to urinary tract infection although urine culture returned negative.  Symptoms resolved with alternate antibiotic prescribed by urology. Pain additionally could be secondary to IBS history versus viral gastroenteritis.  No further recommendations      Screening for colon cancer    Unremarkable colonoscopy 2013 with Dr. Leone Payor. Given her mother's history of colon cancer she would continue to aggressively screen for colon cancer despite her age.  She would like to move forward with doing a Cologuard.  She understands the high rate of false positives in her age group with this type of testing.  She would still like to proceed.  She understands a positive test would lead to repeat colonoscopy consideration.      Relevant Orders   Cologuard    Kerby Nora, MD

## 2023-06-08 ENCOUNTER — Other Ambulatory Visit: Payer: Self-pay | Admitting: Family Medicine

## 2023-06-08 DIAGNOSIS — Z1231 Encounter for screening mammogram for malignant neoplasm of breast: Secondary | ICD-10-CM

## 2023-06-10 ENCOUNTER — Other Ambulatory Visit: Payer: Self-pay | Admitting: *Deleted

## 2023-06-10 DIAGNOSIS — M810 Age-related osteoporosis without current pathological fracture: Secondary | ICD-10-CM

## 2023-06-20 LAB — COLOGUARD

## 2023-06-20 NOTE — Assessment & Plan Note (Signed)
Unremarkable colonoscopy 2013 with Dr. Leone Payor. Given her mother's history of colon cancer she would continue to aggressively screen for colon cancer despite her age.  She would like to move forward with doing a Cologuard.  She understands the high rate of false positives in her age group with this type of testing.  She would still like to proceed.  She understands a positive test would lead to repeat colonoscopy consideration.

## 2023-06-20 NOTE — Assessment & Plan Note (Signed)
Acute, now resolved.  Possibly secondary to urinary tract infection although urine culture returned negative.  Symptoms resolved with alternate antibiotic prescribed by urology. Pain additionally could be secondary to IBS history versus viral gastroenteritis.  No further recommendations

## 2023-06-22 ENCOUNTER — Ambulatory Visit: Payer: 59

## 2023-06-23 ENCOUNTER — Ambulatory Visit (INDEPENDENT_AMBULATORY_CARE_PROVIDER_SITE_OTHER): Payer: 59 | Admitting: Dermatology

## 2023-06-23 VITALS — BP 134/71 | HR 63

## 2023-06-23 DIAGNOSIS — Z7189 Other specified counseling: Secondary | ICD-10-CM

## 2023-06-23 DIAGNOSIS — W908XXA Exposure to other nonionizing radiation, initial encounter: Secondary | ICD-10-CM | POA: Diagnosis not present

## 2023-06-23 DIAGNOSIS — L821 Other seborrheic keratosis: Secondary | ICD-10-CM

## 2023-06-23 DIAGNOSIS — L820 Inflamed seborrheic keratosis: Secondary | ICD-10-CM

## 2023-06-23 DIAGNOSIS — L578 Other skin changes due to chronic exposure to nonionizing radiation: Secondary | ICD-10-CM | POA: Diagnosis not present

## 2023-06-23 DIAGNOSIS — L650 Telogen effluvium: Secondary | ICD-10-CM

## 2023-06-23 DIAGNOSIS — Z79899 Other long term (current) drug therapy: Secondary | ICD-10-CM

## 2023-06-23 MED ORDER — MINOXIDIL 2.5 MG PO TABS: ORAL_TABLET | ORAL | 3 refills | Status: DC

## 2023-06-23 NOTE — Progress Notes (Signed)
Follow-Up Visit   Subjective  Sherri Murray is a 82 y.o. female who presents for the following: hair loss that started a few years ago, but has started worsening around 2021. Patient would like to discuss treatment options. Minoxidil 2.5mg  1/2 tab po QD was sent in February, but patient states that she never received a call from [facility]. She is using Biotin daily.   The patient has spots, moles and lesions to be evaluated, some may be new or changing and the patient may have concern these could be cancer.   The following portions of the chart were reviewed this encounter and updated as appropriate: medications, allergies, medical history  Review of Systems:  No other skin or systemic complaints except as noted in HPI or Assessment and Plan.  Objective  Well appearing patient in no apparent distress; mood and affect are within normal limits.  A focused examination was performed of the following areas:   Relevant exam findings are noted in the Assessment and Plan.  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  Telogen effluvium is a benign, self-limited condition causing increased hair shedding usually for several months. It does not progress to baldness, and the hair eventually grows back on its own. It can be triggered by recent illness, recent surgery, thyroid disease, low iron stores, vitamin D deficiency, fad diets or rapid weight loss, hormonal changes such as pregnancy or birth control pills, and some medication. Usually the hair loss starts 2-3 months after the illness or health change. Rarely, it can continue for longer than a year. Treatments options may include oral or topical Minoxidil; Red Light scalp treatments; Biotin 2.5 mg daily and other options.  Treatment Plan: Continue Biotin 2.5mg  po QD. Start  Minoxidil 2.5 mg take 1/2 tab po QD. Doses of minoxidil for hair loss are considered 'low dose'. This is because the doses used for hair loss are much lower than the doses which are used for conditions such as high blood pressure (hypertension). The doses used for hypertension are 10-40mg  per day.  Side effects are uncommon at the low doses (up to 2.5 mg/day) used to treat hair loss. Potential side effects, more commonly seen at higher doses, include: Increase in hair growth (hypertrichosis) elsewhere on face and body Temporary hair shedding upon starting medication which may last up to 4 weeks Ankle swelling, fluid retention, rapid weight gain more than 5 pounds Low blood pressure and feeling lightheaded or dizzy when standing up quickly Fast or irregular heartbeat Headaches a SEBORRHEIC KERATOSIS - Stuck-on, waxy, tan-brown papules and/or plaques  - Benign-appearing - Discussed benign etiology and prognosis. - Observe - Call for any changes  Inflamed seborrheic keratosis L chest x 1  Symptomatic, irritating, patient would like treated.   Destruction of lesion - L chest x 1 Complexity: simple   Destruction method: cryotherapy   Informed consent: discussed and consent obtained   Timeout:  patient name, date of birth, surgical site, and procedure verified Lesion destroyed using liquid nitrogen: Yes   Region frozen until ice ball extended beyond lesion: Yes   Outcome: patient tolerated procedure well with no complications   Post-procedure details: wound care instructions given    Telogen effluvium  Related Medications minoxidil (LONITEN) 2.5 MG tablet Take 1/2 tablet  daily   ACTINIC DAMAGE - chronic, secondary to cumulative UV radiation exposure/sun exposure over time - diffuse scaly erythematous macules with underlying dyspigmentation - Recommend daily broad spectrum sunscreen SPF 30+ to sun-exposed areas, reapply every 2 hours as needed.  - Recommend staying in the shade or  wearing long sleeves, sun glasses (UVA+UVB protection) and wide brim hats (4-inch brim around the entire circumference of the hat). - Call for new or changing lesions.  Return in about 6 months (around 12/21/2023) for telogen affluvium follow up.  Maylene Roes, CMA, am acting as scribe for Armida Sans, MD .  Documentation: I have reviewed the above documentation for accuracy and completeness, and I agree with the above.  Armida Sans, MD

## 2023-06-23 NOTE — Patient Instructions (Signed)

## 2023-06-25 ENCOUNTER — Encounter: Payer: Self-pay | Admitting: Dermatology

## 2023-06-28 ENCOUNTER — Ambulatory Visit (INDEPENDENT_AMBULATORY_CARE_PROVIDER_SITE_OTHER): Payer: 59 | Admitting: Podiatry

## 2023-06-28 ENCOUNTER — Encounter: Payer: Self-pay | Admitting: Podiatry

## 2023-06-28 DIAGNOSIS — B351 Tinea unguium: Secondary | ICD-10-CM | POA: Diagnosis not present

## 2023-06-28 DIAGNOSIS — M79676 Pain in unspecified toe(s): Secondary | ICD-10-CM | POA: Diagnosis not present

## 2023-07-03 NOTE — Progress Notes (Signed)
Subjective:  Patient ID: Sherri Murray, female    DOB: [birth year],  MRN: [id number]  Sherri Murray presents to clinic today for: painful elongated mycotic toenails 1-5 bilaterally which are tender when wearing enclosed shoe gear. Pain is relieved with periodic professional debridement.   PCP is Bedsole, Amy E, MD.  Allergies  Allergen Reactions   Apple Juice Swelling    Gums Gums Gums   Banana Swelling    Gums, tongue Gums, tongue Gums, tongue   Barium-Containing Compounds Swelling    Throat swells   Bee Venom Hives   Celery Oil Swelling    Gums Gums   Iodinated Contrast Media Swelling and Anaphylaxis    Other reaction(s): SWELLING THROAT SWELLING Other reaction(s): SWELLING THROAT SWELLING   Ioxaglate Swelling    THROAT SWELLING THROAT SWELLING   Metrizamide Anaphylaxis and Swelling    Other reaction(s): SWELLING THROAT SWELLING Other reaction(s): SWELLING THROAT SWELLING Other reaction(s): SWELLING THROAT SWELLING Other reaction(s): SWELLING THROAT SWELLING   Other Swelling, Hives and Nausea And Vomiting    Peanut butter/celery Throat swells   Penicillins Anaphylaxis and Swelling    Has patient had a PCN reaction causing immediate rash, facial/tongue/throat swelling, SOB or lightheadedness with hypotension: Yes Has patient had a PCN reaction causing severe rash involving mucus membranes or skin necrosis: No Has patient had a PCN reaction that required hospitalization No Has patient had a PCN reaction occurring within the last 10 years: No If all of the above answers are "NO", then may proceed with Cephalosporin use.    Strawberry Extract Swelling    Gums, tongue, lips Gums, tongue, lips Gums, tongue, lips   Aspirin Other (See Comments)    Other reaction(s): OTHER Claims to see silver things with regular ASA but patient says she tolerates baby Aspirin okay.    Barium Sulfate     Throat swells   Latex     Other reaction(s): UNKNOWN    Licorice [Glycyrrhiza]    Sulfa Antibiotics     Other reaction(s): SWELLING   Etodolac Nausea And Vomiting    Review of Systems: Negative except as noted in the HPI.  Objective: No changes noted in today's physical examination. There were no vitals filed for this visit.  Sherri Murray is a pleasant 82 y.o. female in NAD. AAO x 3.  Vascular Examination: Capillary refill time <3 seconds b/l LE. Palpable pedal pulses b/l LE. Digital hair present b/l. No pedal edema b/l. Skin temperature gradient WNL b/l. No varicosities b/l. No cyanosis or clubbing noted b/l LE.Marland Kitchen  Dermatological Examination: Pedal skin with normal turgor, texture and tone b/l. No open wounds. No interdigital macerations b/l. Toenails 1-5 b/l thickened, discolored, dystrophic with subungual debris. There is pain on palpation to dorsal aspect of nailplates. No corns, calluses nor porokeratotic lesions noted.  Neurological Examination: Protective sensation intact with 10 gram monofilament b/l LE. Vibratory sensation intact b/l LE.   Musculoskeletal Examination: Muscle strength 5/5 to all lower extremity muscle groups bilaterally. No pain, crepitus or joint limitation noted with ROM bilateral LE. No gross bony deformities bilaterally.     Latest Ref Rng & Units 11/20/2022    2:48 PM  Hemoglobin A1C  Hemoglobin-A1c <5.7 % of total Hgb 5.6    Assessment/Plan: 1. Pain due to onychomycosis of toenail   -Consent given for treatment as described below: -Examined patient. -Continue supportive shoe gear daily. -Mycotic toenails 1-5 bilaterally were debrided in length and girth with sterile nail nippers and dremel without incident. -  Patient/POA to call should there be question/concern in the interim.   Return in about 3 months (around 09/27/2023).  Freddie Breech, DPM

## 2023-07-07 ENCOUNTER — Ambulatory Visit: Payer: 59 | Admitting: Obstetrics and Gynecology

## 2023-07-08 DIAGNOSIS — Z1211 Encounter for screening for malignant neoplasm of colon: Secondary | ICD-10-CM | POA: Diagnosis not present

## 2023-07-13 ENCOUNTER — Encounter: Payer: Self-pay | Admitting: Radiology

## 2023-07-13 LAB — COLOGUARD: COLOGUARD: NEGATIVE

## 2023-07-27 NOTE — Telephone Encounter (Signed)
Spoke with patient regarding Covid Vaccines. Informed patient that our office has decided we will not participate in the administration of the Covid Vaccine as initially planned. We are directing patients to the [employer] [facility] Locations offering vaccines: [facility], [facility], [facility], [facility], [place], [place] and [place]. Vaccine hours are Monday - Friday 9:00 am - 4:00 pm. No appointment required. Patient informed me that she is not interested in getting revaccinated.

## 2023-09-27 ENCOUNTER — Ambulatory Visit (INDEPENDENT_AMBULATORY_CARE_PROVIDER_SITE_OTHER): Payer: 59 | Admitting: Podiatry

## 2023-09-27 DIAGNOSIS — Z91199 Patient's noncompliance with other medical treatment and regimen due to unspecified reason: Secondary | ICD-10-CM

## 2023-09-27 NOTE — Progress Notes (Signed)
1. No-show for appointment     

## 2023-11-04 ENCOUNTER — Telehealth: Payer: Self-pay | Admitting: *Deleted

## 2023-11-04 DIAGNOSIS — E7800 Pure hypercholesterolemia, unspecified: Secondary | ICD-10-CM

## 2023-11-04 DIAGNOSIS — E890 Postprocedural hypothyroidism: Secondary | ICD-10-CM

## 2023-11-04 DIAGNOSIS — R7303 Prediabetes: Secondary | ICD-10-CM

## 2023-11-04 NOTE — Telephone Encounter (Signed)
-----   Message from Alvina Chou sent at 11/04/2023 10:48 AM EST ----- Regarding: lab orders for Tue, 2.11.25 Patient is scheduled for CPX labs, please order future labs, Thanks , Camelia Eng

## 2023-11-12 ENCOUNTER — Ambulatory Visit (INDEPENDENT_AMBULATORY_CARE_PROVIDER_SITE_OTHER): Payer: 59

## 2023-11-12 VITALS — BP 138/82 | Ht 62.5 in | Wt 151.0 lb

## 2023-11-12 DIAGNOSIS — H9193 Unspecified hearing loss, bilateral: Secondary | ICD-10-CM

## 2023-11-12 DIAGNOSIS — Z0000 Encounter for general adult medical examination without abnormal findings: Secondary | ICD-10-CM

## 2023-11-12 NOTE — Progress Notes (Signed)
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 Resource Referral / Chronic Care Management: CRR required this visit?  No   CCM required this visit?  No    Plan:     I have personally reviewed and noted the following in the patient's chart:   Medical and social history Use of alcohol, tobacco or illicit drugs  Current medications and supplements including opioid prescriptions. Patient is not currently taking opioid prescriptions. Functional ability and status Nutritional status Physical activity Advanced directives List of other physicians Hospitalizations, surgeries, and ER visits in previous 12 months Vitals Screenings to include cognitive, depression, and falls Referrals and appointments  In addition, I have reviewed and discussed with patient certain preventive protocols, quality  metrics, and best practice recommendations. A written personalized care plan for preventive services as well as general preventive health recommendations were provided to patient.    Sue Lush, LPN   [id number]/62/[id number]   After Visit Summary: (In Person-Printed) AVS printed and given to the patient  Nurse Notes: The patient states they are doing alright. Relays being behind on her appts due to her daughter having her car for work. Pt is aware of appts and tests: states will schedule.and has no concerns or questions at this time.

## 2023-11-12 NOTE — Patient Instructions (Signed)
Sherri Murray , Thank you for taking time to come for your Medicare Wellness Visit. I appreciate your ongoing commitment to your health goals. Please review the following plan we discussed and let me know if I can assist you in the future.   Referrals/Orders/Follow-Ups/Clinician Recommendations: none  This is a list of the screening recommended for you and due dates:  Health Maintenance  Topic Date Due   DTaP/Tdap/Td vaccine (2 - Tdap) 10/12/2020   DEXA scan (bone density measurement)  09/11/2021   COVID-19 Vaccine (5 - 2024-25 season) 06/13/2023   Mammogram  06/20/2023   Flu Shot  01/10/2024*   Medicare Annual Wellness Visit  11/11/2024   Pneumonia Vaccine  Completed   Zoster (Shingles) Vaccine  Completed   HPV Vaccine  Aged Out  *Topic was postponed. The date shown is not the original due date.    Advanced directives: (Copy Requested) Please bring a copy of your health care power of attorney and living will to the office to be added to your chart at your convenience.  Next Medicare Annual Wellness Visit scheduled for next year: Yes 11/13/2024 @ 3:40pm in person

## 2023-11-23 ENCOUNTER — Other Ambulatory Visit (INDEPENDENT_AMBULATORY_CARE_PROVIDER_SITE_OTHER): Payer: 59

## 2023-11-23 DIAGNOSIS — E7800 Pure hypercholesterolemia, unspecified: Secondary | ICD-10-CM | POA: Diagnosis not present

## 2023-11-23 DIAGNOSIS — R7303 Prediabetes: Secondary | ICD-10-CM

## 2023-11-23 DIAGNOSIS — E890 Postprocedural hypothyroidism: Secondary | ICD-10-CM

## 2023-11-24 LAB — TSH: TSH: 4.88 u[IU]/mL (ref 0.35–5.50)

## 2023-11-24 LAB — LIPID PANEL
Cholesterol: 180 mg/dL (ref 0–200)
HDL: 61.9 mg/dL (ref >39.00)
LDL Cholesterol: 103 mg/dL — ABNORMAL HIGH (ref 0–99)
NonHDL: 117.65
Total CHOL/HDL Ratio: 3
Triglycerides: 71 mg/dL (ref 0.0–149.0)
VLDL: 14.2 mg/dL (ref 0.0–40.0)

## 2023-11-24 LAB — COMPREHENSIVE METABOLIC PANEL
ALT: 15 U/L (ref 0–35)
AST: 20 U/L (ref 0–37)
Albumin: 4.3 g/dL (ref 3.5–5.2)
Alkaline Phosphatase: 58 U/L (ref 39–117)
BUN: 15 mg/dL (ref 6–23)
CO2: 26 meq/L (ref 19–32)
Calcium: 9.3 mg/dL (ref 8.4–10.5)
Chloride: 105 meq/L (ref 96–112)
Creatinine, Ser: 0.71 mg/dL (ref 0.40–1.20)
GFR: 78.53 mL/min (ref >60.00)
Glucose, Bld: 114 mg/dL — ABNORMAL HIGH (ref 70–99)
Potassium: 3.9 meq/L (ref 3.5–5.1)
Sodium: 140 meq/L (ref 135–145)
Total Bilirubin: 0.5 mg/dL (ref 0.2–1.2)
Total Protein: 6.9 g/dL (ref 6.0–8.3)

## 2023-11-24 LAB — T3, FREE: T3, Free: 2.5 pg/mL (ref 2.3–4.2)

## 2023-11-24 LAB — T4, FREE: Free T4: 0.69 ng/dL (ref 0.60–1.60)

## 2023-11-24 LAB — HEMOGLOBIN A1C: Hgb A1c MFr Bld: 5.9 % (ref 4.6–6.5)

## 2023-11-24 NOTE — Progress Notes (Signed)
No critical labs need to be addressed urgently. We will discuss labs in detail at upcoming office visit.

## 2023-11-30 ENCOUNTER — Encounter: Payer: 59 | Admitting: Family Medicine

## 2023-12-15 ENCOUNTER — Ambulatory Visit: Payer: 59 | Admitting: Audiologist

## 2023-12-18 ENCOUNTER — Other Ambulatory Visit: Payer: Self-pay | Admitting: Family Medicine

## 2023-12-22 ENCOUNTER — Ambulatory Visit: Payer: 59 | Admitting: Dermatology

## 2023-12-30 ENCOUNTER — Other Ambulatory Visit: Payer: Self-pay | Admitting: Family Medicine

## 2024-01-04 ENCOUNTER — Ambulatory Visit (INDEPENDENT_AMBULATORY_CARE_PROVIDER_SITE_OTHER): Admitting: Family Medicine

## 2024-01-04 ENCOUNTER — Telehealth: Payer: Self-pay | Admitting: Family Medicine

## 2024-01-04 ENCOUNTER — Encounter: Payer: Self-pay | Admitting: Family Medicine

## 2024-01-04 VITALS — BP 144/80 | HR 68 | Temp 98.2°F | Ht 62.5 in | Wt 154.1 lb

## 2024-01-04 DIAGNOSIS — E7800 Pure hypercholesterolemia, unspecified: Secondary | ICD-10-CM

## 2024-01-04 DIAGNOSIS — M810 Age-related osteoporosis without current pathological fracture: Secondary | ICD-10-CM | POA: Insufficient documentation

## 2024-01-04 DIAGNOSIS — R7303 Prediabetes: Secondary | ICD-10-CM | POA: Diagnosis not present

## 2024-01-04 DIAGNOSIS — I5189 Other ill-defined heart diseases: Secondary | ICD-10-CM

## 2024-01-04 DIAGNOSIS — E890 Postprocedural hypothyroidism: Secondary | ICD-10-CM | POA: Diagnosis not present

## 2024-01-04 DIAGNOSIS — Z0000 Encounter for general adult medical examination without abnormal findings: Secondary | ICD-10-CM

## 2024-01-04 DIAGNOSIS — I10 Essential (primary) hypertension: Secondary | ICD-10-CM | POA: Diagnosis not present

## 2024-01-04 NOTE — Assessment & Plan Note (Signed)
 Chornic.. was on fosamax for 1 year... not on since 2021.  Given  heavy dental work in process will not restart fosamax.  Will re-eval with DEXA.

## 2024-01-04 NOTE — Assessment & Plan Note (Signed)
Stable thyroid function tests on no thyroid medication.

## 2024-01-04 NOTE — Assessment & Plan Note (Signed)
 Treat HTN, euvolemic.

## 2024-01-04 NOTE — Progress Notes (Signed)
 Patient ID: Sherri Murray, female    DOB: 05/09/1941, 83 y.o.   MRN: [id number]  This visit was conducted in person.  BP (!) 158/80 (BP Location: Left Arm, Patient Position: Sitting, Cuff Size: Large)   Pulse 68   Temp 98.2 F (36.8 C) (Temporal)   Ht 5' 2.5" (1.588 m)   Wt 154 lb 2 oz (69.9 kg)   SpO2 98%   BMI 27.74 kg/m    CC:  Chief Complaint  Patient presents with   Annual Exam    Part 2 ([facility] 11/12/2023)    Subjective:   HPI: Sherri Murray is a 83 y.o. female presenting on 01/04/2024 for Annual Exam (Part 2 ([facility] 11/12/2023))  The patient presents for  complete physical and review of chronic health problems. He/She also has the following acute concerns today:   " Everything is falling apart"... asked pt to make later appt to discuss issues.Marland Kitchen especially head pain and recheck BP.  The patient saw a LPN or RN for medicare wellness visit. 11/12/2023  Prevention and wellness was reviewed in detail. Note reviewed and important notes copied below.  Currently taking PCN  and clindamycin for dental work.   Saw Dr. Lynnette Caffey 11/24/2021  Dobutamine stress echo:  unremarkable   She does have allergies and asthma... no cough, no congestion.   OAB On oxybutynin  Hypertension:   Great control  in past on losartan  50 mg daily BP Readings from Last 3 Encounters:  01/04/24 (!) 158/80  11/12/23 138/82  06/23/23 134/71   Using medication without problems or lightheadedness: none Chest pain with exertion: none Edema:none Short of breath:none Average home BPs: not checking. Other issues:  Prediabetes   Lab Results  Component Value Date   HGBA1C 5.9 11/23/2023    High cholesterol  At goal on atorvastatin 40 mg daily. Lab Results  Component Value Date   CHOL 180 11/23/2023   HDL 61.90 11/23/2023   LDLCALC 103 (H) 11/23/2023   LDLDIRECT 136.1 11/02/2013   TRIG 71.0 11/23/2023   CHOLHDL 3 11/23/2023    Hypothyroid, not on thyroid medication Lab  Results  Component Value Date   TSH 4.88 11/23/2023     Fibromyalgia followed by Dr. Corliss Skains.     Relevant past medical, surgical, family and social history reviewed and updated as indicated. Interim medical history since our last visit reviewed. Allergies and medications reviewed and updated. Outpatient Medications Prior to Visit  Medication Sig Dispense Refill   acetaminophen (TYLENOL) 325 MG tablet Take 650 mg by mouth every 6 (six) hours as needed.     albuterol (VENTOLIN HFA) 108 (90 Base) MCG/ACT inhaler Inhale 2 puffs into the lungs every 6 (six) hours as needed for wheezing or shortness of breath. 8 g 2   atorvastatin (LIPITOR) 40 MG tablet TAKE 1 TABLET BY MOUTH EVERYDAY AT BEDTIME 90 tablet 3   Biotin 5000 MCG TABS Take 5,000 mcg by mouth daily.     Biotin w/ Vitamins C & E (HAIR/SKIN/NAILS PO) Take 1 tablet by mouth in the morning, at noon, and at bedtime.     Calcium Carbonate-Vitamin D 600-400 MG-UNIT chew tablet Chew 1 tablet by mouth daily.      Cholecalciferol (VITAMIN D PO) Take by mouth daily.     clindamycin (CLEOCIN) 300 MG capsule Take 300 mg by mouth 3 (three) times daily.     dicyclomine (BENTYL) 20 MG tablet TAKE 1 TABLET (20 MG TOTAL) BY MOUTH 4 (FOUR) TIMES  DAILY - BEFORE MEALS AND AT BEDTIME. 360 tablet 3   diphenhydramine-acetaminophen (TYLENOL PM) 25-500 MG TABS tablet Take 1 tablet by mouth at bedtime as needed.     EPINEPHrine 0.3 mg/0.3 mL IJ SOAJ injection Inject 0.3 mg into the muscle as needed. 1 each 1   estradiol (ESTRACE) 0.1 MG/GM vaginal cream Place 0.5 g vaginally 2 (two) times a week. Place 0.5g nightly for two weeks then twice a week after 30 g 11   Flaxseed, Linseed, (FLAXSEED OIL PO) Take 1 capsule by mouth in the morning and at bedtime.     FOLIC ACID PO Take 1 tablet by mouth daily.      GLUCOSAMINE-CHONDROITIN PO Take 1 tablet by mouth 2 (two) times daily.     loperamide (IMODIUM) 2 MG capsule Take 2 mg by mouth as needed for diarrhea or  loose stools.     losartan (COZAAR) 25 MG tablet TAKE 2 TABLETS BY MOUTH EVERY DAY 180 tablet 3   MAGNESIUM PO Take 1 tablet by mouth daily.     MELATONIN PO Take by mouth at bedtime.     meloxicam (MOBIC) 15 MG tablet TAKE 1 TABLET BY MOUTH EVERY DAY 90 tablet 0   minoxidil (LONITEN) 2.5 MG tablet Take 1/2 tablet daily 30 tablet 3   montelukast (SINGULAIR) 10 MG tablet Take 1 tablet (10 mg total) by mouth at bedtime. 30 tablet 11   Multiple Vitamins-Minerals (CENTRUM SILVER) tablet Take 1 tablet by mouth daily.     Multiple Vitamins-Minerals (ZINC PO) Take 1 tablet by mouth daily.     nystatin cream (MYCOSTATIN) APPLY TO AFFECTED AREA TWICE A DAY AS NEEDED 60 g 3   oxybutynin (DITROPAN-XL) 10 MG 24 hr tablet Take 10 mg by mouth daily.     pantoprazole (PROTONIX) 40 MG tablet TAKE 1 TABLET BY MOUTH TWICE A DAY 180 tablet 0   alendronate (FOSAMAX) 70 MG tablet TAKE 1 TABLET (70 MG TOTAL) BY MOUTH ONCE A WEEK. TAKE WITH A FULL GLASS OF WATER ON AN EMPTY STOMACH. 4 tablet 0   loratadine (CLARITIN) 10 MG tablet Take 1 tablet (10 mg total) by mouth daily for 30 doses. 30 tablet 11   No facility-administered medications prior to visit.     Per HPI unless specifically indicated in ROS section below Review of Systems  Constitutional:  Negative for fatigue and fever.  HENT:  Negative for congestion.   Eyes:  Negative for pain.  Respiratory:  Negative for cough and shortness of breath.   Cardiovascular:  Negative for chest pain, palpitations and leg swelling.  Gastrointestinal:  Negative for abdominal pain.  Genitourinary:  Negative for dysuria and vaginal bleeding.  Musculoskeletal:  Negative for back pain.  Neurological:  Negative for syncope, light-headedness and headaches.  Psychiatric/Behavioral:  Negative for dysphoric mood.    Objective:  BP (!) 158/80 (BP Location: Left Arm, Patient Position: Sitting, Cuff Size: Large)   Pulse 68   Temp 98.2 F (36.8 C) (Temporal)   Ht 5' 2.5"  (1.588 m)   Wt 154 lb 2 oz (69.9 kg)   SpO2 98%   BMI 27.74 kg/m   Wt Readings from Last 3 Encounters:  01/04/24 154 lb 2 oz (69.9 kg)  11/12/23 151 lb (68.5 kg)  06/01/23 152 lb 2 oz (69 kg)      Physical Exam Constitutional:      General: She is not in acute distress.    Appearance: Normal appearance. She is well-developed. She  is not ill-appearing or toxic-appearing.  HENT:     Head: Normocephalic.     Right Ear: Hearing, tympanic membrane, ear canal and external ear normal. Tympanic membrane is not erythematous, retracted or bulging.     Left Ear: Hearing, tympanic membrane, ear canal and external ear normal. Tympanic membrane is not erythematous, retracted or bulging.     Nose: No mucosal edema or rhinorrhea.     Right Sinus: No maxillary sinus tenderness or frontal sinus tenderness.     Left Sinus: No maxillary sinus tenderness or frontal sinus tenderness.     Mouth/Throat:     Pharynx: Uvula midline.  Eyes:     General: Lids are normal. Lids are everted, no foreign bodies appreciated.     Conjunctiva/sclera: Conjunctivae normal.     Pupils: Pupils are equal, round, and reactive to light.  Neck:     Thyroid: No thyroid mass or thyromegaly.     Vascular: No carotid bruit.     Trachea: Trachea normal.  Cardiovascular:     Rate and Rhythm: Normal rate and regular rhythm.     Pulses: Normal pulses.     Heart sounds: Normal heart sounds, S1 normal and S2 normal. No murmur heard.    No friction rub. No gallop.  Pulmonary:     Effort: Pulmonary effort is normal. No tachypnea or respiratory distress.     Breath sounds: Normal breath sounds. No decreased breath sounds, wheezing, rhonchi or rales.  Abdominal:     General: Bowel sounds are normal.     Palpations: Abdomen is soft.     Tenderness: There is no abdominal tenderness.  Musculoskeletal:     Cervical back: Normal range of motion and neck supple.  Skin:    General: Skin is warm and dry.     Findings: No rash.   Neurological:     Mental Status: She is alert and oriented to person, place, and time.     GCS: GCS eye subscore is 4. GCS verbal subscore is 5. GCS motor subscore is 6.     Cranial Nerves: No cranial nerve deficit.     Sensory: No sensory deficit.     Motor: No abnormal muscle tone.     Coordination: Coordination normal.     Gait: Gait normal.     Deep Tendon Reflexes: Reflexes are normal and symmetric.     Comments: Nml cerebellar exam   No papilledema  Psychiatric:        Mood and Affect: Mood is not anxious or depressed.        Speech: Speech normal.        Behavior: Behavior normal. Behavior is cooperative.        Thought Content: Thought content normal.        Cognition and Memory: Memory is not impaired. She does not exhibit impaired recent memory or impaired remote memory.        Judgment: Judgment normal.       Results for orders placed or performed in visit on 11/23/23  TSH   Collection Time: 11/23/23  3:43 PM  Result Value Ref Range   TSH 4.88 0.35 - 5.50 uIU/mL  T4, free   Collection Time: 11/23/23  3:43 PM  Result Value Ref Range   Free T4 0.69 0.60 - 1.60 ng/dL  T3, free   Collection Time: 11/23/23  3:43 PM  Result Value Ref Range   T3, Free 2.5 2.3 - 4.2 pg/mL  Lipid panel  Collection Time: 11/23/23  3:43 PM  Result Value Ref Range   Cholesterol 180 0 - 200 mg/dL   Triglycerides 40.9 0.0 - 149.0 mg/dL   HDL 81.19 >14.78 mg/dL   VLDL 29.5 0.0 - 62.1 mg/dL   LDL Cholesterol 308 (H) 0 - 99 mg/dL   Total CHOL/HDL Ratio 3    NonHDL 117.65   Hemoglobin A1c   Collection Time: 11/23/23  3:43 PM  Result Value Ref Range   Hgb A1c MFr Bld 5.9 4.6 - 6.5 %  Comprehensive metabolic panel   Collection Time: 11/23/23  3:43 PM  Result Value Ref Range   Sodium 140 135 - 145 mEq/L   Potassium 3.9 3.5 - 5.1 mEq/L   Chloride 105 96 - 112 mEq/L   CO2 26 19 - 32 mEq/L   Glucose, Bld 114 (H) 70 - 99 mg/dL   BUN 15 6 - 23 mg/dL   Creatinine, Ser 6.57 0.40 - 1.20  mg/dL   Total Bilirubin 0.5 0.2 - 1.2 mg/dL   Alkaline Phosphatase 58 39 - 117 U/L   AST 20 0 - 37 U/L   ALT 15 0 - 35 U/L   Total Protein 6.9 6.0 - 8.3 g/dL   Albumin 4.3 3.5 - 5.2 g/dL   GFR 84.69 >62.95 mL/min   Calcium 9.3 8.4 - 10.5 mg/dL    This visit occurred during the SARS-CoV-2 public health emergency.  Safety protocols were in place, including screening questions prior to the visit, additional usage of staff PPE, and extensive cleaning of exam room while observing appropriate contact time as indicated for disinfecting solutions.   COVID 19 screen:  No recent travel or known exposure to COVID19 The patient denies respiratory symptoms of COVID 19 at this time. The importance of social distancing was discussed today.   Assessment and Plan The patient's preventative maintenance and recommended screening tests for an annual wellness exam were reviewed in full today. Brought up to date unless services declined.  Counselled on the importance of diet, exercise, and its role in overall health and mortality. The patient's FH and SH was reviewed, including their home life, tobacco status, and drug and alcohol status.    Vaccine: COVID x 3, S/P PNA,  shingrix, will consider RSV.  Plans yearly mammogram...  06/2022 nml  Colon: not indicated at age 89, negative cologuard 06/2023  DEXA 2020 osteoporosis on fosamax ( but appears she is not taking fosamax maybe only for 1 year)...  DUE.Marland Kitchen given dental issue will hold off restarting for now.  Problem List Items Addressed This Visit     Age-related osteoporosis without current pathological fracture    Chornic.. was on fosamax for 1 year... not on since 2021.  Given  heavy dental work in process will not restart fosamax.  Will re-eval with DEXA.      Relevant Orders   DG Bone Density   Benign essential HTN   At goal in office, chronic.  Continue current medication. Check BP at home.Marland Kitchen goal < 150/90.Marland Kitchen call with measurements in 2 weeks,  sooner if consistently elevated.   on losartan  2 tablets of 25 mg daily      Diastolic dysfunction   Treat HTN, euvolemic.      High cholesterol   Stable, chronic.  Continue current medication.   Atorvastatin 40 mg daily.      Post-surgical hypothyroidism   Stable thyroid function tests on no thyroid medication.      Prediabetes   Stable  control. Encouraged exercise, weight loss, healthy eating habits.       Other Visit Diagnoses       Routine general medical examination at a health care facility    -  Primary        Kerby Nora, MD

## 2024-01-04 NOTE — Assessment & Plan Note (Addendum)
 At goal in office, chronic.  Continue current medication. Check BP at home.Marland Kitchen goal < 150/90.Marland Kitchen call with measurements in 2 weeks, sooner if consistently elevated.   on losartan  2 tablets of 25 mg daily

## 2024-01-04 NOTE — Telephone Encounter (Signed)
 Pt asked during checkout, could transportation possibly be provided by her insurance company for her next ov with Dr. Ermalene Searing? Pt's insurance is with [employer]. Please advise. Call back # [phone]

## 2024-01-04 NOTE — Assessment & Plan Note (Signed)
Stable, chronic.  Continue current medication.   Atorvastatin 40 mg daily. 

## 2024-01-04 NOTE — Patient Instructions (Addendum)
  Check BP at home.Marland Kitchen goal < 150/90.Marland Kitchen call with measurements in 2 weeks, sooner if consistently elevated.  Please call the location of your choice from the menu below to schedule your Mammogram and/or Bone Density appointment.    [facility]   [facility]                      Phone:  [phone] [address]                               [place], [place] [address]                                                             Services: Traditional and 3D Mammogram, Bone Density

## 2024-01-04 NOTE — Assessment & Plan Note (Signed)
Stable control. Encouraged exercise, weight loss, healthy eating habits.  

## 2024-01-05 NOTE — Addendum Note (Signed)
 Addended by: Kerby Nora E on: 01/05/2024 04:34 PM   Modules accepted: Orders

## 2024-01-07 NOTE — Telephone Encounter (Signed)
 Per [facility] transportation Safe [employer]--[phone]  No out of pocket for patient  Called patient to let her know to contact her insurance when she needs to arrange a ride

## 2024-01-17 DIAGNOSIS — H5000 Unspecified esotropia: Secondary | ICD-10-CM | POA: Diagnosis not present

## 2024-01-17 DIAGNOSIS — H43813 Vitreous degeneration, bilateral: Secondary | ICD-10-CM | POA: Diagnosis not present

## 2024-01-17 DIAGNOSIS — Z961 Presence of intraocular lens: Secondary | ICD-10-CM | POA: Diagnosis not present

## 2024-01-17 DIAGNOSIS — D3132 Benign neoplasm of left choroid: Secondary | ICD-10-CM | POA: Diagnosis not present

## 2024-01-18 ENCOUNTER — Ambulatory Visit: Admitting: Family Medicine

## 2024-01-18 ENCOUNTER — Ambulatory Visit (INDEPENDENT_AMBULATORY_CARE_PROVIDER_SITE_OTHER): Admitting: Family Medicine

## 2024-01-18 ENCOUNTER — Encounter: Payer: Self-pay | Admitting: Family Medicine

## 2024-01-18 VITALS — BP 145/66 | HR 55 | Temp 98.8°F | Ht 62.5 in | Wt 155.1 lb

## 2024-01-18 DIAGNOSIS — R519 Headache, unspecified: Secondary | ICD-10-CM | POA: Diagnosis not present

## 2024-01-18 DIAGNOSIS — I83813 Varicose veins of bilateral lower extremities with pain: Secondary | ICD-10-CM

## 2024-01-18 DIAGNOSIS — I10 Essential (primary) hypertension: Secondary | ICD-10-CM | POA: Diagnosis not present

## 2024-01-18 DIAGNOSIS — N3281 Overactive bladder: Secondary | ICD-10-CM

## 2024-01-18 DIAGNOSIS — Z87820 Personal history of traumatic brain injury: Secondary | ICD-10-CM

## 2024-01-18 DIAGNOSIS — G8929 Other chronic pain: Secondary | ICD-10-CM

## 2024-01-18 MED ORDER — LOSARTAN POTASSIUM 50 MG PO TABS: 50.0000 mg | ORAL_TABLET | Freq: Every day | ORAL | 3 refills | Status: DC
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
## 2024-01-18 NOTE — Assessment & Plan Note (Signed)
 Chronic, worsening.  Will try compression hose.. prescription given 15-130 mg HG.

## 2024-01-18 NOTE — Assessment & Plan Note (Signed)
 Blood pressure continues to be above goal in office but she has not been following it at home. She has been out of her medication for 2 days and will restart losartan 50 mg daily.  She will bring in measurements within 2 weeks.  If her blood pressure is not at goal we will consider increasing to losartan 100 mg p.o. daily.  Elevated blood pressure may be the primary cause of her headache. She also does several things that may be contributing to blood pressure elevation including 1 pot of coffee per day, NSAIDs and possible decongestant... She will try to stop these as well.

## 2024-01-18 NOTE — Patient Instructions (Addendum)
 Restart med losartan 50 mg daily.  Check Blood pressure once a day... call or drop off measurement in 2 week... for consideration of increasing the dose of losartan. Avoid decongestants,   Gradually decrease caffeine as able.  Stop  Advil PM and change to Melatonin 10 mg at bedtime.

## 2024-01-18 NOTE — Assessment & Plan Note (Signed)
 Chronic, inadequately controlled despite Ditropan XL per urology.  Of note she reports she has been using Advil PM which does contain a diphenhydramine type medication which could contribute to bladder retention and overflow incontinence.  I have suggested she use melatonin for sleep instead.

## 2024-01-24 ENCOUNTER — Ambulatory Visit: Admitting: Dermatology

## 2024-01-25 ENCOUNTER — Ambulatory Visit (INDEPENDENT_AMBULATORY_CARE_PROVIDER_SITE_OTHER): Admitting: Dermatology

## 2024-01-25 ENCOUNTER — Encounter: Payer: Self-pay | Admitting: Dermatology

## 2024-01-25 DIAGNOSIS — Z79899 Other long term (current) drug therapy: Secondary | ICD-10-CM | POA: Diagnosis not present

## 2024-01-25 DIAGNOSIS — L820 Inflamed seborrheic keratosis: Secondary | ICD-10-CM | POA: Diagnosis not present

## 2024-01-25 DIAGNOSIS — L821 Other seborrheic keratosis: Secondary | ICD-10-CM | POA: Diagnosis not present

## 2024-01-25 DIAGNOSIS — Z7189 Other specified counseling: Secondary | ICD-10-CM | POA: Diagnosis not present

## 2024-01-25 DIAGNOSIS — L578 Other skin changes due to chronic exposure to nonionizing radiation: Secondary | ICD-10-CM | POA: Diagnosis not present

## 2024-01-25 DIAGNOSIS — W908XXA Exposure to other nonionizing radiation, initial encounter: Secondary | ICD-10-CM | POA: Diagnosis not present

## 2024-01-25 DIAGNOSIS — L650 Telogen effluvium: Secondary | ICD-10-CM

## 2024-01-25 MED ORDER — MINOXIDIL 2.5 MG PO TABS
ORAL_TABLET | ORAL | 2 refills | Status: AC
Start: 2024-01-25 — End: ?

## 2024-01-25 NOTE — Progress Notes (Signed)
 Follow-Up Visit   Subjective  Sherri Murray is a 83 y.o. female who presents for the following: Telogen effluvium 19m f/u, scalp, Biotin qd, Minoxidil 2.5mg  1/2 qd, hair has not improved, check growths arms The patient has spots, moles and lesions to be evaluated, some may be new or changing and the patient may have concern these could be cancer.  The following portions of the chart were reviewed this encounter and updated as appropriate: medications, allergies, medical history  Review of Systems:  No other skin or systemic complaints except as noted in HPI or Assessment and Plan.  Objective  Well appearing patient in no apparent distress; mood and affect are within normal limits.  A focused examination was performed of the following areas: scalp  Relevant exam findings are noted in the Assessment and Plan.  L forearm x 1, L side x 2 (3) Stuck on waxy paps with erythema        Assessment & Plan   TELOGEN EFFLUVIUM Thyroid labs from 11/23/2023 wnl  Exam: Diffuse thinning of hair, positive hair pull test, no ankle swelling  Telogen effluvium is a benign, self-limited condition causing increased hair shedding usually for several months. It does not progress to baldness, and the hair eventually grows back on its own. It can be triggered by recent illness, recent surgery, thyroid disease, low iron stores, vitamin D deficiency, fad diets or rapid weight loss, hormonal changes such as pregnancy or birth control pills, and some medication. Usually the hair loss starts 2-3 months after the illness or health change. Rarely, it can continue for longer than a year. Treatments options may include oral or topical Minoxidil; Red Light scalp treatments; Biotin 2.5 mg daily and other options.  Treatment Plan: Increase Minoxidil to Minoxidil 2.5mg  1 po qd  Cont Biotin 2.5mg  1 po qd  Doses of oral minoxidil for hair loss are considered 'low dose'. This is because the doses used  for hair loss are much lower than the doses which are used for conditions such as high blood pressure (hypertension). The doses used for hypertension are 10-40mg  per day.  Side effects are uncommon at the low doses (up to 2.5 mg/day) used to treat hair loss. Potential side effects, more commonly seen at higher doses, include: Increase in hair growth (hypertrichosis) elsewhere on face and body Temporary hair shedding upon starting medication which may last up to 4 weeks Ankle swelling, fluid retention, rapid weight gain more than 5 pounds Low blood pressure and feeling lightheaded or dizzy when standing up quickly Fast or irregular heartbeat Headaches  Long term medication management.  Patient is using long term (months to years) prescription medication  to control their dermatologic condition.  These medications require periodic monitoring to evaluate for efficacy and side effects and may require periodic laboratory monitoring.   ACTINIC DAMAGE - chronic, secondary to cumulative UV radiation exposure/sun exposure over time - diffuse scaly erythematous macules with underlying dyspigmentation - Recommend daily broad spectrum sunscreen SPF 30+ to sun-exposed areas, reapply every 2 hours as needed.  - Recommend staying in the shade or wearing long sleeves, sun glasses (UVA+UVB protection) and wide brim hats (4-inch brim around the entire circumference of the hat). - Call for new or changing lesions.  SEBORRHEIC KERATOSIS - Stuck-on, waxy, tan-brown papules and/or plaques  - Benign-appearing - Discussed benign etiology and prognosis. - Observe - Call for any changes  INFLAMED SEBORRHEIC KERATOSIS (3) L forearm x 1, L side x 2 (3) Symptomatic, irritating,  patient would like treated. Destruction of lesion - L forearm x 1, L side x 2 (3) Complexity: simple   Destruction method: cryotherapy   Informed consent: discussed and consent obtained   Timeout:  patient name, date of birth, surgical  site, and procedure verified Lesion destroyed using liquid nitrogen: Yes   Region frozen until ice ball extended beyond lesion: Yes   Outcome: patient tolerated procedure well with no complications   Post-procedure details: wound care instructions given   TELOGEN EFFLUVIUM   Related Medications minoxidil (LONITEN) 2.5 MG tablet 1 po qd ACTINIC SKIN DAMAGE   SEBORRHEIC KERATOSIS   COUNSELING AND COORDINATION OF CARE   MEDICATION MANAGEMENT    Return in about 8 months (around 09/25/2024) for TBSE, Telogen Effluvium.  I, Rollie Clipper, RMA, am acting as scribe for Celine Collard, MD .   Documentation: I have reviewed the above documentation for accuracy and completeness, and I agree with the above.  Celine Collard, MD

## 2024-01-25 NOTE — Patient Instructions (Addendum)

## 2024-02-24 ENCOUNTER — Ambulatory Visit: Admitting: Podiatry

## 2024-02-24 ENCOUNTER — Encounter: Payer: Self-pay | Admitting: Podiatry

## 2024-02-24 DIAGNOSIS — B351 Tinea unguium: Secondary | ICD-10-CM | POA: Diagnosis not present

## 2024-02-24 DIAGNOSIS — M79676 Pain in unspecified toe(s): Secondary | ICD-10-CM | POA: Diagnosis not present

## 2024-02-24 DIAGNOSIS — I83813 Varicose veins of bilateral lower extremities with pain: Secondary | ICD-10-CM | POA: Diagnosis not present

## 2024-02-24 NOTE — Patient Instructions (Signed)
 [facility] [address] [place], [id number] [phone]  Purchase Hoka sneakers or New Balance sneakers with soft mesh uppers and wide toe box.

## 2024-02-28 NOTE — Progress Notes (Signed)
 Subjective:  Patient ID: Sherri Murray, female    DOB: 02/28/1941,  MRN: [id number]  Sherri Murray presents to clinic today for painful mycotic toenails of both feet that are difficult to trim. Pain interferes with daily activities and wearing enclosed shoe gear comfortably. Patient would like Rx for compression hose for her varicose veins as well as shoe recommendations for daily wear. Chief Complaint  Patient presents with   Nail Problem    "My feet look disgusting.  I can't cut them myself."   PCP is Bedsole, Amy E, MD.  Allergies  Allergen Reactions   Apple Juice Swelling    Gums Gums Gums   Banana Swelling    Gums, tongue Gums, tongue Gums, tongue   Barium-Containing Compounds Swelling    Throat swells   Bee Venom Hives   Celery Oil Swelling    Gums Gums   Iodinated Contrast Media Swelling and Anaphylaxis    Other reaction(s): SWELLING THROAT SWELLING Other reaction(s): SWELLING THROAT SWELLING   Ioxaglate Swelling    THROAT SWELLING THROAT SWELLING   Metrizamide Anaphylaxis and Swelling    Other reaction(s): SWELLING THROAT SWELLING Other reaction(s): SWELLING THROAT SWELLING Other reaction(s): SWELLING THROAT SWELLING Other reaction(s): SWELLING THROAT SWELLING   Other Swelling, Hives and Nausea And Vomiting    Peanut butter/celery Throat swells   Penicillins Anaphylaxis and Swelling    Has patient had a PCN reaction causing immediate rash, facial/tongue/throat swelling, SOB or lightheadedness with hypotension: Yes Has patient had a PCN reaction causing severe rash involving mucus membranes or skin necrosis: No Has patient had a PCN reaction that required hospitalization No Has patient had a PCN reaction occurring within the last 10 years: No If all of the above answers are "NO", then may proceed with Cephalosporin use.    Strawberry Extract Swelling    Gums, tongue, lips Gums, tongue, lips Gums, tongue, lips   Aspirin  Other (See  Comments)    Other reaction(s): OTHER Claims to see silver things with regular ASA but patient says she tolerates baby Aspirin  okay.    Barium Sulfate     Throat swells   Latex     Other reaction(s): UNKNOWN   Licorice [Licorice (Glycyrrhiza)]    Sulfa Antibiotics     Other reaction(s): SWELLING   Etodolac Nausea And Vomiting    Review of Systems: Negative except as noted in the HPI.  Objective: No changes noted in today's physical examination. There were no vitals filed for this visit. Sherri Murray is a pleasant 83 y.o. female WD, WN in NAD. AAO x 3.  Vascular Examination: Capillary refill time immediate b/l. Vascular status intact b/l with palpable pedal pulses. Pedal hair present b/l. No pain with calf compression b/l. Skin temperature gradient WNL b/l. No cyanosis or clubbing b/l. No ischemia or gangrene noted b/l. Varicosities present b/l.  Neurological Examination: Sensation grossly intact b/l with 10 gram monofilament.   Dermatological Examination: Pedal skin with normal turgor, texture and tone b/l.  No open wounds. No interdigital macerations.   Toenails 1-5 b/l thick, discolored, elongated with subungual debris and pain on dorsal palpation.   No hyperkeratotic nor porokeratotic lesions present on today's visit.  Musculoskeletal Examination: Muscle strength 5/5 to all lower extremity muscle groups bilaterally. No pain, crepitus or joint limitation noted with ROM bilateral LE. HAV with bunion deformity noted b/l LE.  Radiographs: None  Last A1c:      Latest Ref Rng & Units 11/23/2023    3:43 PM  Hemoglobin A1C  Hemoglobin-A1c 4.6 - 6.5 % 5.9    Assessment/Plan: 1. Pain due to onychomycosis of toenail   2. Varicose veins of bilateral lower extremities with pain   Patient was evaluated and treated. All patient's and/or POA's questions/concerns addressed on today's visit. Toenails 1-5 debrided in length and girth without incident. Continue soft,  supportive shoe gear daily. Report any pedal injuries to medical professional. Call office if there are any questions/concerns. -Rx written for compression hose 20-30 mmHg, level knee high. -Shoe recommendations given for Hoka or New Balance from [employer]. -Patient/POA to call should there be question/concern in the interim.   Return in about 3 months (around 05/26/2024).  Luella Sager, DPM      [place] LOCATION: [address], [place] [address]                   Office [phone]   [facility] LOCATION: [address] [place], [place] [address] Office [phone]

## 2024-03-26 ENCOUNTER — Other Ambulatory Visit: Payer: Self-pay | Admitting: Family Medicine

## 2024-04-11 ENCOUNTER — Ambulatory Visit (INDEPENDENT_AMBULATORY_CARE_PROVIDER_SITE_OTHER): Admitting: Family Medicine

## 2024-04-11 ENCOUNTER — Encounter: Payer: Self-pay | Admitting: Family Medicine

## 2024-04-11 VITALS — BP 160/90 | HR 70 | Temp 98.9°F | Ht 62.5 in | Wt 149.5 lb

## 2024-04-11 DIAGNOSIS — M810 Age-related osteoporosis without current pathological fracture: Secondary | ICD-10-CM | POA: Diagnosis not present

## 2024-04-11 DIAGNOSIS — M797 Fibromyalgia: Secondary | ICD-10-CM

## 2024-04-11 DIAGNOSIS — N3281 Overactive bladder: Secondary | ICD-10-CM

## 2024-04-11 DIAGNOSIS — R5383 Other fatigue: Secondary | ICD-10-CM | POA: Diagnosis not present

## 2024-04-11 DIAGNOSIS — R2689 Other abnormalities of gait and mobility: Secondary | ICD-10-CM | POA: Diagnosis not present

## 2024-04-11 MED ORDER — TRAZODONE HCL 50 MG PO TABS: 25.0000 mg | ORAL_TABLET | Freq: Every evening | ORAL | 3 refills | Status: DC | PRN

## 2024-04-11 MED ORDER — GEMTESA 75 MG PO TABS: 75.0000 mg | ORAL_TABLET | Freq: Every day | ORAL | 11 refills | Status: AC

## 2024-04-11 NOTE — Patient Instructions (Addendum)
 Stop oxybutynin .  Start gemtessa.  Goal  BP < 150/90... follow BP at home.  Ure tylenol  for body pain  alternating with advil.   Start trazodone for sleep at night , Stop tylenol  PM.

## 2024-04-11 NOTE — Progress Notes (Signed)
 Patient ID: Sherri Murray, female    DOB: 07-Dec-1940, 83 y.o.   MRN: [id number]  This visit was conducted in person.  BP (!) 160/90   Pulse 70   Temp 98.9 F (37.2 C) (Temporal)   Ht 5' 2.5 (1.588 m)   Wt 149 lb 8 oz (67.8 kg)   SpO2 96%   BMI 26.91 kg/m    CC:  Chief Complaint  Patient presents with   Fatigue    Subjective:   HPI: Sherri Murray is a 83 y.o. female with history of hypertension, diastolic dysfunction, fibromyalgia, high cholesterol presenting on 04/11/2024 for Fatigue  She presents today with her daughters to report a decline in her overall health.  She has been very fatigued, feeling off balance in the last 2 -3weeks  She has not been taking medication regularly per her daughters... has run out.  No focal weakness, no slurred speech. Generalized weakness/fatigue.   At last office visit January 04, 2024 she had noted head pain, blood pressure elevations Total body pain and fibromyalgia followed by Dr. Dolphus. Complete metabolic panel, thyroid  panel and A1c unremarkable in February 2025.  HTN inadequate control on losartan  50 mg daily.   Recently BP 149/? BP Readings from Last 3 Encounters:  04/11/24 (!) 160/90  01/18/24 (!) 145/66  01/04/24 (!) 144/80   She has not taken Gemtessa for OAB, fosamax  for osteoporosis in last few months.  She has been taking oxybutynin  twice a day... in last several weeks.   Taking advil 400 mg every 4 hours... some days if    Using tylenol  PM at night for sleep.    Relevant past medical, surgical, family and social history reviewed and updated as indicated. Interim medical history since our last visit reviewed. Allergies and medications reviewed and updated. Outpatient Medications Prior to Visit  Medication Sig Dispense Refill   atorvastatin  (LIPITOR) 40 MG tablet TAKE 1 TABLET BY MOUTH EVERYDAY AT BEDTIME 90 tablet 3   fexofenadine (ALLEGRA) 180 MG tablet Take 180 mg by mouth daily.      GLUCOSAMINE-CHONDROITIN PO Take 1 tablet by mouth 2 (two) times daily.     losartan  (COZAAR ) 50 MG tablet Take 1 tablet (50 mg total) by mouth daily. 30 tablet 3   Methylsulfonylmethane (MSM) 1000 MG TABS Take 1 tablet by mouth daily.     minoxidil  (LONITEN ) 2.5 MG tablet 1 po qd 90 tablet 2   Nutritional Supplements (NUTRITIONAL SUPPLEMENT PO) Take 800 mg by mouth daily. Garcinia Cambogia     Nutritional Supplements (NUTRITIONAL SUPPLEMENT PO) Take 1 tablet by mouth daily. Quercetin & Pterostilbene     pantoprazole  (PROTONIX ) 40 MG tablet TAKE 1 TABLET BY MOUTH TWICE A DAY 180 tablet 0   zinc gluconate 50 MG tablet Take 50 mg by mouth daily.     oxybutynin  (DITROPAN -XL) 10 MG 24 hr tablet Take 10 mg by mouth daily.     acetaminophen  (TYLENOL ) 325 MG tablet Take 650 mg by mouth every 6 (six) hours as needed.     albuterol  (VENTOLIN  HFA) 108 (90 Base) MCG/ACT inhaler Inhale 2 puffs into the lungs every 6 (six) hours as needed for wheezing or shortness of breath. 8 g 2   EPINEPHrine  0.3 mg/0.3 mL IJ SOAJ injection Inject 0.3 mg into the muscle as needed. 1 each 1   Biotin  5000 MCG TABS Take 5,000 mcg by mouth daily.     Biotin  w/ Vitamins C & E (HAIR/SKIN/NAILS PO) Take 1  tablet by mouth in the morning, at noon, and at bedtime.     Calcium  Carbonate-Vitamin D  600-400 MG-UNIT chew tablet Chew 1 tablet by mouth daily.      Cholecalciferol  (VITAMIN D  PO) Take by mouth daily.     clindamycin  (CLEOCIN ) 300 MG capsule Take 300 mg by mouth 3 (three) times daily.     dicyclomine  (BENTYL ) 20 MG tablet TAKE 1 TABLET (20 MG TOTAL) BY MOUTH 4 (FOUR) TIMES DAILY - BEFORE MEALS AND AT BEDTIME. 360 tablet 3   diphenhydramine -acetaminophen  (TYLENOL  PM) 25-500 MG TABS tablet Take 1 tablet by mouth at bedtime as needed.     estradiol  (ESTRACE ) 0.1 MG/GM vaginal cream Place 0.5 g vaginally 2 (two) times a week. Place 0.5g nightly for two weeks then twice a week after 30 g 11   Flaxseed, Linseed, (FLAXSEED OIL PO)  Take 1 capsule by mouth in the morning and at bedtime.     FOLIC ACID PO Take 1 tablet by mouth daily.      loperamide (IMODIUM) 2 MG capsule Take 2 mg by mouth as needed for diarrhea or loose stools.     loratadine  (CLARITIN ) 10 MG tablet Take 1 tablet (10 mg total) by mouth daily for 30 doses. 30 tablet 11   MAGNESIUM PO Take 1 tablet by mouth daily.     MELATONIN PO Take by mouth at bedtime.     montelukast  (SINGULAIR ) 10 MG tablet Take 1 tablet (10 mg total) by mouth at bedtime. 30 tablet 11   Multiple Vitamins-Minerals (CENTRUM SILVER) tablet Take 1 tablet by mouth daily.     Multiple Vitamins-Minerals (ZINC PO) Take 1 tablet by mouth daily.     nystatin  cream (MYCOSTATIN ) APPLY TO AFFECTED AREA TWICE A DAY AS NEEDED 60 g 3   No facility-administered medications prior to visit.     Per HPI unless specifically indicated in ROS section below Review of Systems  Constitutional:  Positive for fatigue. Negative for fever.  HENT:  Negative for congestion.   Eyes:  Negative for pain.  Respiratory:  Negative for cough and shortness of breath.   Cardiovascular:  Negative for chest pain, palpitations and leg swelling.  Gastrointestinal:  Negative for abdominal pain.  Genitourinary:  Negative for dysuria and vaginal bleeding.  Musculoskeletal:  Positive for myalgias. Negative for back pain.  Neurological:  Positive for weakness. Negative for syncope, light-headedness, numbness and headaches.  Psychiatric/Behavioral:  Positive for sleep disturbance. Negative for dysphoric mood.    Objective:  BP (!) 160/90   Pulse 70   Temp 98.9 F (37.2 C) (Temporal)   Ht 5' 2.5 (1.588 m)   Wt 149 lb 8 oz (67.8 kg)   SpO2 96%   BMI 26.91 kg/m   Wt Readings from Last 3 Encounters:  04/11/24 149 lb 8 oz (67.8 kg)  01/18/24 155 lb 2 oz (70.4 kg)  01/04/24 154 lb 2 oz (69.9 kg)      Physical Exam Constitutional:      General: She is not in acute distress.    Appearance: Normal appearance. She is  well-developed. She is not ill-appearing or toxic-appearing.  HENT:     Head: Normocephalic.     Right Ear: Hearing, tympanic membrane, ear canal and external ear normal. Tympanic membrane is not erythematous, retracted or bulging.     Left Ear: Hearing, tympanic membrane, ear canal and external ear normal. Tympanic membrane is not erythematous, retracted or bulging.     Nose: No mucosal edema  or rhinorrhea.     Right Sinus: No maxillary sinus tenderness or frontal sinus tenderness.     Left Sinus: No maxillary sinus tenderness or frontal sinus tenderness.     Mouth/Throat:     Pharynx: Uvula midline.   Eyes:     General: Lids are normal. Lids are everted, no foreign bodies appreciated.     Conjunctiva/sclera: Conjunctivae normal.     Pupils: Pupils are equal, round, and reactive to light.   Neck:     Thyroid : No thyroid  mass or thyromegaly.     Vascular: No carotid bruit.     Trachea: Trachea normal.   Cardiovascular:     Rate and Rhythm: Normal rate and regular rhythm.     Pulses: Normal pulses.     Heart sounds: Normal heart sounds, S1 normal and S2 normal. No murmur heard.    No friction rub. No gallop.  Pulmonary:     Effort: Pulmonary effort is normal. No tachypnea or respiratory distress.     Breath sounds: Normal breath sounds. No decreased breath sounds, wheezing, rhonchi or rales.  Abdominal:     General: Bowel sounds are normal.     Palpations: Abdomen is soft.     Tenderness: There is no abdominal tenderness.   Musculoskeletal:     Cervical back: Normal range of motion and neck supple.   Skin:    General: Skin is warm and dry.     Findings: No rash.   Neurological:     Mental Status: She is alert and oriented to person, place, and time.     GCS: GCS eye subscore is 4. GCS verbal subscore is 5. GCS motor subscore is 6.     Cranial Nerves: No cranial nerve deficit.     Sensory: No sensory deficit.     Motor: No abnormal muscle tone.     Coordination: Romberg  sign negative. Coordination abnormal. Finger-Nose-Finger Test normal.     Gait: Gait normal.     Deep Tendon Reflexes: Reflexes are normal and symmetric.     Comments: Nml cerebellar exam except unable to stand on one leg.  Slightly off balance with tunring.   No papilledema  Psychiatric:        Mood and Affect: Mood is not anxious or depressed.        Speech: Speech normal.        Behavior: Behavior normal. Behavior is cooperative.        Thought Content: Thought content normal.        Cognition and Memory: Memory is not impaired. She does not exhibit impaired recent memory or impaired remote memory.        Judgment: Judgment normal.       Results for orders placed or performed in visit on 11/23/23  TSH   Collection Time: 11/23/23  3:43 PM  Result Value Ref Range   TSH 4.88 0.35 - 5.50 uIU/mL  T4, free   Collection Time: 11/23/23  3:43 PM  Result Value Ref Range   Free T4 0.69 0.60 - 1.60 ng/dL  T3, free   Collection Time: 11/23/23  3:43 PM  Result Value Ref Range   T3, Free 2.5 2.3 - 4.2 pg/mL  Lipid panel   Collection Time: 11/23/23  3:43 PM  Result Value Ref Range   Cholesterol 180 0 - 200 mg/dL   Triglycerides 28.9 0.0 - 149.0 mg/dL   HDL 38.09 >60.99 mg/dL   VLDL 85.7 0.0 - 59.9 mg/dL  LDL Cholesterol 103 (H) 0 - 99 mg/dL   Total CHOL/HDL Ratio 3    NonHDL 117.65   Hemoglobin A1c   Collection Time: 11/23/23  3:43 PM  Result Value Ref Range   Hgb A1c MFr Bld 5.9 4.6 - 6.5 %  Comprehensive metabolic panel   Collection Time: 11/23/23  3:43 PM  Result Value Ref Range   Sodium 140 135 - 145 mEq/L   Potassium 3.9 3.5 - 5.1 mEq/L   Chloride 105 96 - 112 mEq/L   CO2 26 19 - 32 mEq/L   Glucose, Bld 114 (H) 70 - 99 mg/dL   BUN 15 6 - 23 mg/dL   Creatinine, Ser 9.28 0.40 - 1.20 mg/dL   Total Bilirubin 0.5 0.2 - 1.2 mg/dL   Alkaline Phosphatase 58 39 - 117 U/L   AST 20 0 - 37 U/L   ALT 15 0 - 35 U/L   Total Protein 6.9 6.0 - 8.3 g/dL   Albumin 4.3 3.5 - 5.2 g/dL    GFR 21.46 >39.99 mL/min   Calcium  9.3 8.4 - 10.5 mg/dL    Assessment and Plan New symptoms of significant fatigue and balance issues may be medication related.  She is taking multiple sedating anticholinergic medications.  She will stop Tylenol  PM and oxybutynin .  We will change to Gemtesa and she will try a trial of low-dose trazodone at bedtime for insomnia.  We will also evaluate fatigue with lab evaluation.  She has no clear cardiopulmonary symptoms that seem to be because of fatigue.  She has fairly normal neurologic exam except slight balance issue.  There is no specific focal neurologic findings suggestive of stroke.  Her weakness is generalized.  Her blood pressure has been elevated in office today but daughters plan to follow-up more closely at home.  She may need an increase in the losartan  50 mg daily.  At last office visit recommended reevaluation with DEXA for osteoporosis given she has not been on Fosamax  for years.  She states that she was told given her implant for bladder issues she cannot do a bone density.  I think this is incorrect but she will call the phone number on the [employer] implant and determine if DEXA's are contraindicated.  If not she will proceed with DEXA. If she is unable to do the DEXA I would recommend 4 years of Fosamax  as she did have bone density issues when evaluated in 2020.  Given complexity of issues recommend follow-up in 2 to 4 weeks for reevaluation Other fatigue -     VITAMIN D  25 Hydroxy (Vit-D Deficiency, Fractures) -     CBC with Differential/Platelet -     Vitamin B12 -     Lipid panel -     Comprehensive metabolic panel with GFR -     TSH  Balance problem  Age-related osteoporosis without current pathological fracture  OAB (overactive bladder)  Fibromyalgia  Other orders -     Louanne; Take 1 tablet (75 mg total) by mouth daily.  Dispense: 30 tablet; Refill: 11 -     traZODone HCl; Take 0.5-1 tablets (25-50 mg total) by mouth at  bedtime as needed for sleep.  Dispense: 30 tablet; Refill: 3    Return in about 4 weeks (around 05/09/2024) for  follow up multiple issues.   Greig Ring, MD

## 2024-04-12 ENCOUNTER — Ambulatory Visit: Payer: Self-pay | Admitting: Family Medicine

## 2024-04-12 LAB — CBC WITH DIFFERENTIAL/PLATELET
Basophils Absolute: 0 10*3/uL (ref 0.0–0.1)
Basophils Relative: 0.7 % (ref 0.0–3.0)
Eosinophils Absolute: 0.1 10*3/uL (ref 0.0–0.7)
Eosinophils Relative: 1.9 % (ref 0.0–5.0)
HCT: 35.1 % — ABNORMAL LOW (ref 36.0–46.0)
Hemoglobin: 12 g/dL (ref 12.0–15.0)
Lymphocytes Relative: 32.1 % (ref 12.0–46.0)
Lymphs Abs: 2.1 10*3/uL (ref 0.7–4.0)
MCHC: 34.1 g/dL (ref 30.0–36.0)
MCV: 90.5 fl (ref 78.0–100.0)
Monocytes Absolute: 0.6 10*3/uL (ref 0.1–1.0)
Monocytes Relative: 8.9 % (ref 3.0–12.0)
Neutro Abs: 3.6 10*3/uL (ref 1.4–7.7)
Neutrophils Relative %: 56.4 % (ref 43.0–77.0)
Platelets: 277 10*3/uL (ref 150.0–400.0)
RBC: 3.88 Mil/uL (ref 3.87–5.11)
RDW: 12.9 % (ref 11.5–15.5)
WBC: 6.5 10*3/uL (ref 4.0–10.5)

## 2024-04-12 LAB — COMPREHENSIVE METABOLIC PANEL WITH GFR
ALT: 13 U/L (ref 0–35)
AST: 20 U/L (ref 0–37)
Albumin: 4.3 g/dL (ref 3.5–5.2)
Alkaline Phosphatase: 74 U/L (ref 39–117)
BUN: 14 mg/dL (ref 6–23)
CO2: 28 meq/L (ref 19–32)
Calcium: 9.7 mg/dL (ref 8.4–10.5)
Chloride: 102 meq/L (ref 96–112)
Creatinine, Ser: 0.87 mg/dL (ref 0.40–1.20)
GFR: 61.37 mL/min (ref >60.00)
Glucose, Bld: 105 mg/dL — ABNORMAL HIGH (ref 70–99)
Potassium: 4.4 meq/L (ref 3.5–5.1)
Sodium: 139 meq/L (ref 135–145)
Total Bilirubin: 0.4 mg/dL (ref 0.2–1.2)
Total Protein: 7.2 g/dL (ref 6.0–8.3)

## 2024-04-12 LAB — LIPID PANEL
Cholesterol: 149 mg/dL (ref 0–200)
HDL: 49.9 mg/dL (ref >39.00)
LDL Cholesterol: 83 mg/dL (ref 0–99)
NonHDL: 98.86
Total CHOL/HDL Ratio: 3
Triglycerides: 78 mg/dL (ref 0.0–149.0)
VLDL: 15.6 mg/dL (ref 0.0–40.0)

## 2024-04-12 LAB — VITAMIN B12: Vitamin B-12: 496 pg/mL (ref 211–911)

## 2024-04-12 LAB — TSH: TSH: 3.09 u[IU]/mL (ref 0.35–5.50)

## 2024-04-12 LAB — VITAMIN D 25 HYDROXY (VIT D DEFICIENCY, FRACTURES): VITD: 31.88 ng/mL (ref 30.00–100.00)

## 2024-04-14 ENCOUNTER — Other Ambulatory Visit: Payer: Self-pay | Admitting: Family Medicine

## 2024-05-04 ENCOUNTER — Other Ambulatory Visit: Payer: Self-pay | Admitting: Family Medicine

## 2024-05-29 ENCOUNTER — Encounter: Payer: Self-pay | Admitting: Podiatry

## 2024-05-29 ENCOUNTER — Ambulatory Visit (INDEPENDENT_AMBULATORY_CARE_PROVIDER_SITE_OTHER)

## 2024-05-29 ENCOUNTER — Ambulatory Visit (INDEPENDENT_AMBULATORY_CARE_PROVIDER_SITE_OTHER): Admitting: Podiatry

## 2024-05-29 DIAGNOSIS — B351 Tinea unguium: Secondary | ICD-10-CM | POA: Diagnosis not present

## 2024-05-29 DIAGNOSIS — S9031XA Contusion of right foot, initial encounter: Secondary | ICD-10-CM

## 2024-05-29 DIAGNOSIS — M79676 Pain in unspecified toe(s): Secondary | ICD-10-CM | POA: Diagnosis not present

## 2024-05-29 DIAGNOSIS — S92501A Displaced unspecified fracture of right lesser toe(s), initial encounter for closed fracture: Secondary | ICD-10-CM

## 2024-05-30 ENCOUNTER — Encounter: Payer: Self-pay | Admitting: Podiatry

## 2024-05-30 NOTE — Progress Notes (Addendum)
 Subjective:  Patient ID: Sherri Murray, female    DOB: 1941/05/03,  MRN: [id number]  Sherri Murray presents to clinic today for painful thick toenails that are difficult to trim. Pain interferes with ambulation. Aggravating factors include wearing enclosed shoe gear. Pain is relieved with periodic professional debridement.   Patient states she hit her right 5th digit on a door jam attempting to prevent her cat from entering a room. This occurred on yesterday.              PCP is Bedsole, Amy E, MD.  Allergies  Allergen Reactions   Apple Juice Swelling    Gums Gums Gums   Banana Swelling    Gums, tongue Gums, tongue Gums, tongue   Barium-Containing Compounds Swelling    Throat swells   Bee Venom Hives   Celery Oil Swelling    Gums Gums   Iodinated Contrast Media Swelling and Anaphylaxis    Other reaction(s): SWELLING THROAT SWELLING Other reaction(s): SWELLING THROAT SWELLING   Ioxaglate Swelling    THROAT SWELLING THROAT SWELLING   Metrizamide Anaphylaxis and Swelling    Other reaction(s): SWELLING THROAT SWELLING Other reaction(s): SWELLING THROAT SWELLING Other reaction(s): SWELLING THROAT SWELLING Other reaction(s): SWELLING THROAT SWELLING   Other Swelling, Hives and Nausea And Vomiting    Peanut butter/celery Throat swells   Penicillins Anaphylaxis and Swelling    Has patient had a PCN reaction causing immediate rash, facial/tongue/throat swelling, SOB or lightheadedness with hypotension: Yes Has patient had a PCN reaction causing severe rash involving mucus membranes or skin necrosis: No Has patient had a PCN reaction that required hospitalization No Has patient had a PCN reaction occurring within the last 10 years: No If all of the above answers are NO, then may proceed with Cephalosporin use.    Strawberry Extract Swelling    Gums, tongue, lips Gums, tongue, lips Gums, tongue, lips   Aspirin  Other (See Comments)     Other reaction(s): OTHER Claims to see silver things with regular ASA but patient says she tolerates baby Aspirin  okay.    Barium Sulfate     Throat swells   Latex     Other reaction(s): UNKNOWN   Licorice [Licorice (Glycyrrhiza)]    Sulfa Antibiotics     Other reaction(s): SWELLING   Etodolac Nausea And Vomiting    Review of Systems: Negative except as noted in the HPI.  Objective: No changes noted in today's physical examination. There were no vitals filed for this visit. Sherri Murray is a pleasant 83 y.o. female WD, WN in NAD. AAO x 3.  Vascular Examination: Capillary refill time immediate b/l. Palpable pedal pulses. Pedal hair present b/l. Pedal edema absent. No pain with calf compression b/l. Skin temperature gradient WNL b/l. No cyanosis or clubbing b/l. No ischemia or gangrene noted b/l. Varicosities present b/l.  Neurological Examination: Sensation grossly intact b/l with 10 gram monofilament. Vibratory sensation intact b/l.   Dermatological Examination: Pedal skin with normal turgor, texture and tone b/l.  No open wounds. No interdigital macerations.   Toenails 1-5 b/l thick, discolored, elongated with subungual debris and pain on dorsal palpation.   No corns, calluses, nor porokeratotic lesions.  Musculoskeletal Examination: Right 5th digit with ecchymosis and localized edema with tenderness to palpation. Muscle strength 5/5 to all lower extremity muscle groups bilaterally. HAV with bunion deformity noted b/l LE.  Radiographs: Xrays were taken, but due to technical difficulties, they did not cross over into EPIC.  Last A1c:  Latest Ref Rng & Units 11/23/2023    3:43 PM  Hemoglobin A1C  Hemoglobin-A1c 4.6 - 6.5 % 5.9    Assessment/Plan: 1. Pain due to onychomycosis of toenail   2. Closed fracture of phalanx of right fifth toe, initial encounter     Consent given for treatment. Patient examined with Dr. Silva. In absence of xrays due to  technical issue, we will treat as fracture based on clinical presentation. Buddy splint applied with visual and verbal instructions. Patient related understanding. Darco shoe dispensed. She was instructed to not drive with this shoe on. She related understanding. All patient's and/or POA's questions/concerns addressed on today's visit. Mycotic toenails 1-5 debrided in length and girth without incident. Continue soft, supportive shoe gear daily. Report any pedal injuries to medical professional. Call office if there are any questions/concerns. She will see Dr. Silva in 4 weeks for follow up fracture right 5th digit and see me in 3 months for routine foot care.    Return in about 4 weeks (around 06/26/2024).  Sherri Murray, DPM      [place] LOCATION: [address] [place] [address]                   Office [phone]   [place] LOCATION: [address] [place], [place] [address] Office [phone]      I evaluated the patient today in addition to Dr. Merlin she had a ecchymotic right fifth digit with a small blister and no signs of infection or ulceration.  There was a technical error with her radiographs that did not upload we discussed buddy taping and we will treat empirically as a fracture and if she returns for follow-up in 4 weeks following pain we will take new radiographs.  Weight-bear as hard and surgical shoe that we dispensed today.  Follow-up in 4 weeks with me.  Juliene Silva, DPM 05/30/2024

## 2024-06-26 ENCOUNTER — Ambulatory Visit (INDEPENDENT_AMBULATORY_CARE_PROVIDER_SITE_OTHER): Admitting: Podiatry

## 2024-06-26 ENCOUNTER — Ambulatory Visit (INDEPENDENT_AMBULATORY_CARE_PROVIDER_SITE_OTHER)

## 2024-06-26 VITALS — Ht 62.5 in | Wt 149.5 lb

## 2024-06-26 DIAGNOSIS — S90121A Contusion of right lesser toe(s) without damage to nail, initial encounter: Secondary | ICD-10-CM

## 2024-06-26 DIAGNOSIS — M79671 Pain in right foot: Secondary | ICD-10-CM

## 2024-06-29 NOTE — Progress Notes (Signed)
  Subjective:  Patient ID: Sherri Murray, female    DOB: 01-20-1941,  MRN: [id number]  Chief Complaint  Patient presents with   Foot Pain    Follow up. No pain. She is concerned about the color.    83 y.o. female presents with the above complaint. History confirmed with patient.  Feeling much better  Objective:  Physical Exam: warm, good capillary refill, no trophic changes or ulcerative lesions, normal DP and PT pulses, normal sensory exam, and mild erythema on the right fifth toe no tenderness to palpation no edema.   Radiographs: Multiple views x-ray of the right foot: No visible fracture noted Assessment:   1. Foot pain, right      Plan:  Patient was evaluated and treated and all questions answered.  Doing better.  May continue to use Tylenol  or ice as needed for any discomfort.  Should not need further treatment at this point regular shoe gears and activity as tolerated.  Follow-up with me as needed.  Return if symptoms worsen or fail to improve.

## 2024-07-03 ENCOUNTER — Other Ambulatory Visit: Payer: Self-pay | Admitting: Family Medicine

## 2024-07-03 MED ORDER — LOSARTAN POTASSIUM 50 MG PO TABS: 50.0000 mg | ORAL_TABLET | Freq: Every day | ORAL | 1 refills | Status: AC

## 2024-08-05 ENCOUNTER — Other Ambulatory Visit: Payer: Self-pay | Admitting: Family Medicine

## 2024-08-08 ENCOUNTER — Encounter: Payer: Self-pay | Admitting: Pharmacist

## 2024-08-08 NOTE — Progress Notes (Signed)
 Pharmacy Quality Measure Review  This patient is appearing on a report for being at risk of failing the adherence measure for cholesterol (statin) medications this calendar year.   Medication: atorvastatin  40 mg  Last fill date: 03/16/24 for 90 day supply  Contacted pharmacy to facilitate refills.

## 2024-08-22 ENCOUNTER — Ambulatory Visit: Admitting: Dermatology

## 2024-08-31 ENCOUNTER — Ambulatory Visit (INDEPENDENT_AMBULATORY_CARE_PROVIDER_SITE_OTHER): Admitting: Podiatry

## 2024-08-31 DIAGNOSIS — B351 Tinea unguium: Secondary | ICD-10-CM | POA: Diagnosis not present

## 2024-08-31 DIAGNOSIS — M79676 Pain in unspecified toe(s): Secondary | ICD-10-CM

## 2024-08-31 NOTE — Progress Notes (Signed)
 Subjective:  Patient ID: Sherri Murray, female    DOB: Jan 20, 1941,  MRN: [id number]  Sherri Murray presents to clinic today for preventative diabetic foot care for painful mycotic toenails x 10 which interfere with daily activities. Pain is relieved with periodic professional debridement.  Chief Complaint  Patient presents with   Nail Problem    Thick painful toenails, 3 month follow up Medicaid ABN signed today   New problem(s): None.   PCP is Sherri Greig BRAVO, Sherri Murray. ARNETTA 04/11/2024.  Allergies  Allergen Reactions   Apple Juice Swelling    Gums Gums Gums   Banana Swelling    Gums, tongue Gums, tongue Gums, tongue   Barium-Containing Compounds Swelling    Throat swells   Bee Venom Hives   Celery Oil Swelling    Gums Gums   Iodinated Contrast Media Swelling and Anaphylaxis    Other reaction(s): SWELLING THROAT SWELLING Other reaction(s): SWELLING THROAT SWELLING   Ioxaglate Swelling    THROAT SWELLING THROAT SWELLING   Metrizamide Anaphylaxis and Swelling    Other reaction(s): SWELLING THROAT SWELLING Other reaction(s): SWELLING THROAT SWELLING Other reaction(s): SWELLING THROAT SWELLING Other reaction(s): SWELLING THROAT SWELLING   Other Swelling, Hives and Nausea And Vomiting    Peanut butter/celery Throat swells   Penicillins Anaphylaxis and Swelling    Has patient had a PCN reaction causing immediate rash, facial/tongue/throat swelling, SOB or lightheadedness with hypotension: Yes Has patient had a PCN reaction causing severe rash involving mucus membranes or skin necrosis: No Has patient had a PCN reaction that required hospitalization No Has patient had a PCN reaction occurring within the last 10 years: No If all of the above answers are NO, then may proceed with Cephalosporin use.    Strawberry Extract Swelling    Gums, tongue, lips Gums, tongue, lips Gums, tongue, lips   Aspirin  Other (See Comments)    Other reaction(s):  OTHER Claims to see silver things with regular ASA but patient says she tolerates baby Aspirin  okay.    Barium Sulfate     Throat swells   Latex     Other reaction(s): UNKNOWN   Licorice [Licorice (Glycyrrhiza)]    Sulfa Antibiotics     Other reaction(s): SWELLING   Etodolac Nausea And Vomiting    Review of Systems: Negative except as noted in the HPI.  Objective: No changes noted in today's physical examination. There were no vitals filed for this visit. Sherri Murray is a pleasant 83 y.o. female WD, WN in NAD. AAO x 3.  Vascular Examination: Capillary refill time immediate b/l. Palpable pedal pulses. Pedal hair present b/l. Pedal edema absent. No pain with calf compression b/l. Skin temperature gradient WNL b/l. No cyanosis or clubbing b/l. No ischemia or gangrene noted b/l. Varicosities present b/l.  Neurological Examination: Sensation grossly intact b/l with 10 gram monofilament. Vibratory sensation intact b/l.   Dermatological Examination: Pedal skin with normal turgor, texture and tone b/l.  No open wounds. No interdigital macerations.   Toenails 1-5 b/l thick, discolored, elongated with subungual debris and pain on dorsal palpation.   No corns, calluses, nor porokeratotic lesions.  Musculoskeletal Examination: Muscle strength 5/5 to all lower extremity muscle groups bilaterally. HAV with bunion deformity noted b/l LE.  Assessment/Plan: 1. Pain due to onychomycosis of toenail   -Patient was evaluated today. All questions/concerns addressed on today's visit. -Discussed treatment options for onychomycosis. Patient opted for topical OTC therapy. Patient is to apply Vick's Vapor rub with cotton tipped applicator to  affected toenail(s) once daily. -Toenails 1-5 b/l were debrided in length and girth with sterile nail nippers and dremel without iatrogenic bleeding.  -Patient/POA to call should there be question/concern in the interim.   Return in about 3 months  (around 12/01/2024).  Delon LITTIE Merlin, DPM      [place] LOCATION: [address], [place] [address]                   Office [phone]   [facility] LOCATION: [address] [place], [place] [address] Office [phone]

## 2024-09-09 ENCOUNTER — Encounter: Payer: Self-pay | Admitting: Podiatry

## 2024-11-07 ENCOUNTER — Telehealth: Payer: Self-pay | Admitting: *Deleted

## 2024-11-07 NOTE — Telephone Encounter (Signed)
 Noted.

## 2024-11-07 NOTE — Telephone Encounter (Signed)
 Please review message.  I have added it to her allergy list.

## 2024-11-07 NOTE — Telephone Encounter (Signed)
 Copied from CRM [id number]. Topic: Clinical - Medical Advice >> Nov 07, 2024  4:28 PM Fonda T wrote: Reason for CRM: Pt calling states she would like to add a medication allergy to be added to records.  Pt reports medication, Ciprofloxacin  500 mg tablets, prescribed by Darryle Law, prescribed for bladder infection.   Pt states she had an allergic reaction. Reports she was paralyzed after taking medication. No longer on medication, and no current symptoms at time of call.  Pt requesting this be added to record.  Pt requesting a follow up call to make sure this is updated.  Can be reached at [phone].

## 2024-11-13 ENCOUNTER — Ambulatory Visit: Payer: 59

## 2024-11-30 ENCOUNTER — Ambulatory Visit: Admitting: Podiatry

## 2025-03-15 ENCOUNTER — Ambulatory Visit
